# Patient Record
Sex: Female | Born: 1945 | Race: White | Hispanic: No | Marital: Married | Smoking: Former smoker
Health system: Southern US, Community
[De-identification: ages and names within clinical notes are randomized; demographics above are authoritative.]

## PROBLEM LIST (undated history)

## (undated) DIAGNOSIS — I341 Nonrheumatic mitral (valve) prolapse: Secondary | ICD-10-CM

## (undated) DIAGNOSIS — J189 Pneumonia, unspecified organism: Secondary | ICD-10-CM

## (undated) DIAGNOSIS — Z8679 Personal history of other diseases of the circulatory system: Secondary | ICD-10-CM

## (undated) DIAGNOSIS — I739 Peripheral vascular disease, unspecified: Secondary | ICD-10-CM

## (undated) DIAGNOSIS — R112 Nausea with vomiting, unspecified: Secondary | ICD-10-CM

## (undated) DIAGNOSIS — M7040 Prepatellar bursitis, unspecified knee: Secondary | ICD-10-CM

## (undated) DIAGNOSIS — I480 Paroxysmal atrial fibrillation: Secondary | ICD-10-CM

## (undated) DIAGNOSIS — K589 Irritable bowel syndrome without diarrhea: Secondary | ICD-10-CM

## (undated) DIAGNOSIS — M8580 Other specified disorders of bone density and structure, unspecified site: Secondary | ICD-10-CM

## (undated) DIAGNOSIS — I340 Nonrheumatic mitral (valve) insufficiency: Secondary | ICD-10-CM

## (undated) DIAGNOSIS — G4700 Insomnia, unspecified: Secondary | ICD-10-CM

## (undated) DIAGNOSIS — Z9889 Other specified postprocedural states: Secondary | ICD-10-CM

## (undated) DIAGNOSIS — C44601 Unspecified malignant neoplasm of skin of unspecified upper limb, including shoulder: Secondary | ICD-10-CM

## (undated) DIAGNOSIS — R011 Cardiac murmur, unspecified: Secondary | ICD-10-CM

## (undated) DIAGNOSIS — Z87898 Personal history of other specified conditions: Secondary | ICD-10-CM

## (undated) DIAGNOSIS — Z85820 Personal history of malignant melanoma of skin: Secondary | ICD-10-CM

## (undated) HISTORY — DX: Unspecified malignant neoplasm of skin of unspecified upper limb, including shoulder: C44.601

## (undated) HISTORY — DX: Paroxysmal atrial fibrillation: I48.0

## (undated) HISTORY — PX: COLONOSCOPY: SHX174

## (undated) HISTORY — DX: Nonrheumatic mitral (valve) prolapse: I34.1

## (undated) HISTORY — DX: Cardiac murmur, unspecified: R01.1

## (undated) HISTORY — PX: DIAGNOSTIC LAPAROSCOPY: SUR761

## (undated) HISTORY — DX: Other specified disorders of bone density and structure, unspecified site: M85.80

## (undated) HISTORY — DX: Irritable bowel syndrome without diarrhea: K58.9

## (undated) HISTORY — DX: Nonrheumatic mitral (valve) insufficiency: I34.0

## (undated) HISTORY — DX: Insomnia, unspecified: G47.00

---

## 1967-06-23 HISTORY — PX: APPENDECTOMY: SHX54

## 1978-06-22 HISTORY — PX: TONSILLECTOMY: SUR1361

## 1978-06-22 HISTORY — PX: ABDOMINAL HYSTERECTOMY: SHX81

## 2006-06-22 LAB — CONVERTED CEMR LAB: Pap Smear: NORMAL

## 2008-06-22 HISTORY — PX: SHOULDER ARTHROSCOPY: SHX128

## 2008-08-29 ENCOUNTER — Ambulatory Visit: Payer: Self-pay | Admitting: Family Medicine

## 2008-08-29 DIAGNOSIS — J019 Acute sinusitis, unspecified: Secondary | ICD-10-CM

## 2008-09-03 ENCOUNTER — Telehealth: Payer: Self-pay | Admitting: Family Medicine

## 2008-09-04 ENCOUNTER — Telehealth: Payer: Self-pay | Admitting: Family Medicine

## 2008-09-11 ENCOUNTER — Telehealth: Payer: Self-pay | Admitting: Family Medicine

## 2008-09-12 ENCOUNTER — Telehealth: Payer: Self-pay | Admitting: Family Medicine

## 2008-09-17 ENCOUNTER — Telehealth: Payer: Self-pay | Admitting: Family Medicine

## 2008-11-09 ENCOUNTER — Ambulatory Visit (HOSPITAL_COMMUNITY): Admission: RE | Admit: 2008-11-09 | Discharge: 2008-11-09 | Payer: Self-pay | Admitting: Family Medicine

## 2009-01-02 ENCOUNTER — Telehealth: Payer: Self-pay | Admitting: Family Medicine

## 2009-01-24 ENCOUNTER — Telehealth: Payer: Self-pay | Admitting: Family Medicine

## 2009-02-19 ENCOUNTER — Ambulatory Visit: Payer: Self-pay | Admitting: Family Medicine

## 2009-02-19 DIAGNOSIS — F411 Generalized anxiety disorder: Secondary | ICD-10-CM | POA: Insufficient documentation

## 2009-03-22 ENCOUNTER — Telehealth: Payer: Self-pay | Admitting: Family Medicine

## 2009-03-29 ENCOUNTER — Ambulatory Visit: Payer: Self-pay | Admitting: Family Medicine

## 2009-03-29 DIAGNOSIS — G4700 Insomnia, unspecified: Secondary | ICD-10-CM | POA: Insufficient documentation

## 2009-03-29 HISTORY — DX: Insomnia, unspecified: G47.00

## 2009-05-04 ENCOUNTER — Telehealth: Payer: Self-pay | Admitting: Family Medicine

## 2009-05-04 ENCOUNTER — Ambulatory Visit: Payer: Self-pay | Admitting: Family Medicine

## 2009-05-31 ENCOUNTER — Ambulatory Visit: Payer: Self-pay | Admitting: Family Medicine

## 2009-06-26 ENCOUNTER — Ambulatory Visit: Payer: Self-pay | Admitting: Family Medicine

## 2009-06-26 DIAGNOSIS — L02419 Cutaneous abscess of limb, unspecified: Secondary | ICD-10-CM

## 2009-06-26 DIAGNOSIS — L03119 Cellulitis of unspecified part of limb: Secondary | ICD-10-CM

## 2009-07-29 ENCOUNTER — Telehealth: Payer: Self-pay | Admitting: Family Medicine

## 2009-07-30 ENCOUNTER — Telehealth: Payer: Self-pay | Admitting: Family Medicine

## 2009-08-07 ENCOUNTER — Telehealth: Payer: Self-pay | Admitting: *Deleted

## 2009-10-08 ENCOUNTER — Ambulatory Visit: Payer: Self-pay | Admitting: Family Medicine

## 2009-10-31 ENCOUNTER — Telehealth: Payer: Self-pay | Admitting: Family Medicine

## 2009-11-01 ENCOUNTER — Ambulatory Visit: Payer: Self-pay | Admitting: Family Medicine

## 2009-11-01 DIAGNOSIS — J069 Acute upper respiratory infection, unspecified: Secondary | ICD-10-CM | POA: Insufficient documentation

## 2009-11-04 ENCOUNTER — Telehealth: Payer: Self-pay | Admitting: Family Medicine

## 2009-11-11 ENCOUNTER — Ambulatory Visit: Payer: Self-pay | Admitting: Family Medicine

## 2009-11-11 ENCOUNTER — Telehealth: Payer: Self-pay | Admitting: Family Medicine

## 2009-11-11 LAB — CONVERTED CEMR LAB
AST: 22 units/L (ref 0–37)
Albumin: 4.4 g/dL (ref 3.5–5.2)
Alkaline Phosphatase: 55 units/L (ref 39–117)
Basophils Relative: 1.1 % (ref 0.0–3.0)
CO2: 25 meq/L (ref 19–32)
Chloride: 106 meq/L (ref 96–112)
Cholesterol: 215 mg/dL — ABNORMAL HIGH (ref 0–200)
Eosinophils Absolute: 0.1 10*3/uL (ref 0.0–0.7)
Glucose, Urine, Semiquant: NEGATIVE
Lymphocytes Relative: 23.2 % (ref 12.0–46.0)
Lymphs Abs: 1.3 10*3/uL (ref 0.7–4.0)
Monocytes Relative: 8.1 % (ref 3.0–12.0)
Neutrophils Relative %: 65.4 % (ref 43.0–77.0)
Nitrite: NEGATIVE
Potassium: 4.8 meq/L (ref 3.5–5.1)
RBC: 3.92 M/uL (ref 3.87–5.11)
RDW: 13.2 % (ref 11.5–14.6)
Sodium: 142 meq/L (ref 135–145)
Specific Gravity, Urine: 1.025
Total CHOL/HDL Ratio: 3
WBC: 5.8 10*3/uL (ref 4.5–10.5)
pH: 5.5

## 2009-11-12 ENCOUNTER — Ambulatory Visit (HOSPITAL_COMMUNITY): Admission: RE | Admit: 2009-11-12 | Discharge: 2009-11-12 | Payer: Self-pay | Admitting: Family Medicine

## 2009-11-14 ENCOUNTER — Telehealth: Payer: Self-pay | Admitting: Family Medicine

## 2009-11-21 ENCOUNTER — Ambulatory Visit: Payer: Self-pay | Admitting: Family Medicine

## 2010-01-16 ENCOUNTER — Telehealth: Payer: Self-pay | Admitting: Family Medicine

## 2010-01-16 ENCOUNTER — Ambulatory Visit: Payer: Self-pay | Admitting: Family Medicine

## 2010-05-22 ENCOUNTER — Ambulatory Visit: Payer: Self-pay | Admitting: Women's Health

## 2010-05-22 ENCOUNTER — Other Ambulatory Visit
Admission: RE | Admit: 2010-05-22 | Discharge: 2010-05-22 | Payer: Self-pay | Source: Home / Self Care | Admitting: Gynecology

## 2010-07-11 ENCOUNTER — Telehealth: Payer: Self-pay | Admitting: Family Medicine

## 2010-07-16 ENCOUNTER — Ambulatory Visit: Admit: 2010-07-16 | Payer: Self-pay | Admitting: Women's Health

## 2010-07-18 ENCOUNTER — Telehealth: Payer: Self-pay | Admitting: Family Medicine

## 2010-07-22 NOTE — Progress Notes (Signed)
Summary: diflucan??  Phone Note Call from Patient   Caller: Patient Call For: Evelena Peat MD Reason for Call: Acute Illness Summary of Call: [facility]) [phone] Pt has finished antibiotics, and has developed a yeast infection. Would like a RX for Diflucan. Initial call taken by: Lynann Beaver CMA,  Nov 14, 2009 9:43 AM  Follow-up for Phone Call        OK to call in Diflucan 150 mg by mouth times one dose. Follow-up by: Evelena Peat MD,  Nov 14, 2009 4:19 PM    Prescriptions: DIFLUCAN 150 MG TABS (FLUCONAZOLE) one by mouth x 1 dose  #1 x 0   Entered by:   Lynann Beaver CMA   Authorized by:   Evelena Peat MD   Signed by:   Lynann Beaver CMA on 11/14/2009   Method used:   Electronically to        [facility] # [phone]* (retail)       [address]       [place], [place]  [address]       Ph: [phone]       Fax: [phone]   RxID:   [id number]  Pt. notified

## 2010-07-22 NOTE — Progress Notes (Signed)
Summary: F/U ON REQ FOR REFILL Zolpidem  Phone Note Call from Patient   Caller: Patient [phone] Reason for Call: Talk to Nurse, Talk to Doctor Summary of Call: Pt called to ck on status of refill RX for her med (Generic) Ambien - ZOLPIDEM 10MG  TABLETS......Marland KitchenPt adv that same was supposed to be sent to [facility] on [address] but the pharmacy is adv her that they have not received a response from [facility].  Initial call taken by: Debbra Riding,  August 07, 2009 12:17 PM  Follow-up for Phone Call        Rx authorized by Dr Caryl Never and called in, pt informed on phone number left, "this number is no longer in service" Follow-up by: Sid Falcon LPN,  August 07, 2009 1:34 PM     Appended Document: F/U ON REQ FOR REFILL Zolpidem Called pt at # [phone]..... Left msg letting pt know that per Dr Caryl Never / Harriett Sine, LPN... medication was sent to [facility], adv her to c/b if she has any more questions or concerns.

## 2010-07-22 NOTE — Progress Notes (Signed)
Summary: Still having severe sinus, sore throat, eat pain  Phone Note Call from Patient   Caller: Patient Call For: Evelena Peat MD Summary of Call: Pt here for lab work, filled out Triage Patient Request, "still having severe sinus and sore throat, have been on Amoxicillin, still having ear pain. Per written order from Dr Caryl Never, start [facility] 300mg , one tab two times a day for 10 days, #20 Initial call taken by: Sid Falcon LPN,  Nov 11, 2009 1:13 PM  Follow-up for Phone Call        Pt husband informed she is to stop the Amoxicillin, begin [place]vell and follow-up as needed Follow-up by: Sid Falcon LPN,  Nov 11, 2009 3:53 PM    New/Updated Medications: CEFDINIR 300 MG CAPS (CEFDINIR) one tab two times a day Prescriptions: CEFDINIR 300 MG CAPS (CEFDINIR) one tab two times a day  #20 x 0   Entered by:   Sid Falcon LPN   Authorized by:   Evelena Peat MD   Signed by:   Sid Falcon LPN on 04/18/2535   Method used:   Electronically to        [facility] # [phone]* (retail)       [address]       [place], [place]  [address]       Ph: [phone]       Fax: [phone]   RxID:   [id number]

## 2010-07-22 NOTE — Progress Notes (Signed)
Summary: rx for sinus--PLEASE RETURN CALL  Phone Note Call from Patient   Caller: Patient Call For: Evelena Peat MD Reason for Call: Lab or Test Results Summary of Call: [facility] Pt left triage message that she has a sinus infection and would like to be treated by phone.  [facility] [phone] Initial call taken by: Lynann Beaver CMA,  July 29, 2009 9:15 AM  Follow-up for Phone Call        Left message with husband to have her call back. Follow-up by: Lynann Beaver CMA,  July 29, 2009 9:47 AM  Additional Follow-up for Phone Call Additional follow up Details #1::        CALL HER AT [phone]. Teeth, eyes and cheeks hurt. Post nasal drainage. No fever.  Additional Follow-up by: Warnell Forester,  July 29, 2009 9:53 AM    Additional Follow-up for Phone Call Additional follow up Details #2::    Called pt.  Will refill Augmentin and office f/u if no better in 2 weeks. Follow-up by: Evelena Peat MD,  July 29, 2009 10:38 AM  New/Updated Medications: AMOXICILLIN-POT CLAVULANATE 875-125 MG TABS (AMOXICILLIN-POT CLAVULANATE) one by mouth two times a day for 10 days Prescriptions: AMOXICILLIN-POT CLAVULANATE 875-125 MG TABS (AMOXICILLIN-POT CLAVULANATE) one by mouth two times a day for 10 days  #20 x 0   Entered and Authorized by:   Evelena Peat MD   Signed by:   Evelena Peat MD on 07/29/2009   Method used:   Electronically to        [facility] # [phone]* (retail)       [address]       [place], [place]  [address]       Ph: [phone]       Fax: [phone]   RxID:   [id number]

## 2010-07-22 NOTE — Assessment & Plan Note (Signed)
Summary: ear pain//ccm   Vital Signs:  Patient profile:   65 year old female Menstrual status:  hysterectomy Temp:     98.7 degrees F oral BP sitting:   120 / 80  (left arm) Cuff size:   regular  Vitals Entered By: Sid Falcon LPN (October 08, 2009 11:45 AM) CC: R > L ear pain     Menstrual Status hysterectomy Last PAP Result normal   History of Present Illness: Patient seen with bilateral earache right greater than left. Several day history of progressive facial pain. No fever. Occasional yellowish nasal discharge. Occasional vertigo symptoms. No hearing loss. Rare cough.  History of seasonal allergies and uses Astelin regularly.  Patient desires complete physical and will schedule.  Allergies: 1)  ! Prednisone (Pak) (Prednisone)  Past History:  Past Medical History: Last updated: 08/29/2008 Migraines Heart Murmur UTI  Review of Systems      See HPI  Physical Exam  General:  Well-developed,well-nourished,in no acute distress; alert,appropriate and cooperative throughout examination Head:  Normocephalic and atraumatic without obvious abnormalities. No apparent alopecia or balding. Ears:  External ear exam shows no significant lesions or deformities.  Otoscopic examination reveals clear canals, tympanic membranes are intact bilaterally without bulging, retraction, inflammation or discharge. Hearing is grossly normal bilaterally. Nose:  mostly clear nasal mucus bilaterally Mouth:  Oral mucosa and oropharynx without lesions or exudates.  Teeth in good repair. Neck:  No deformities, masses, or tenderness noted. Lungs:  Normal respiratory effort, chest expands symmetrically. Lungs are clear to auscultation, no crackles or wheezes. Heart:  Normal rate and regular rhythm. S1 and S2 normal without gallop, murmur, click, rub or other extra sounds.   Impression & Recommendations:  Problem # 1:  SINUSITIS - ACUTE-NOS (ICD-461.9)  The following medications were removed  from the medication list:    Amoxicillin-pot Clavulanate 875-125 Mg Tabs (Amoxicillin-pot clavulanate) ..... One by mouth two times a day for 10 days Her updated medication list for this problem includes:    Astelin 137 Mcg/spray Soln (Azelastine hcl) .Marland Kitchen... 2 sprays per nostril two times a day as needed    Amoxicillin 875 Mg Tabs (Amoxicillin) ..... One by mouth two times a day for 10 days  Complete Medication List: 1)  Astelin 137 Mcg/spray Soln (Azelastine hcl) .... 2 sprays per nostril two times a day as needed 2)  Alprazolam 0.5 Mg Tabs (Alprazolam) .... One as needed for flying 3)  Fluoxetine Hcl 20 Mg Caps (Fluoxetine hcl) .... Once daily 4)  Zolpidem Tartrate 10 Mg Tabs (Zolpidem tartrate) .... One tab at bedtime as needed 5)  Diflucan 150 Mg Tabs (Fluconazole) .... One by mouth x 1 dose 6)  Amoxicillin 875 Mg Tabs (Amoxicillin) .... One by mouth two times a day for 10 days  Patient Instructions: 1)  Acute sinusitis symptoms for less than 10 days are not helped by antibiotics. Use warm moist compresses, and over the counter decongestants( only as directed). Call if no improvement in 5-7 days, sooner if increasing pain, fever, or new symptoms.  2)  Schedule complete physical examination Prescriptions: AMOXICILLIN 875 MG TABS (AMOXICILLIN) one by mouth two times a day for 10 days  #20 x 0   Entered and Authorized by:   Evelena Peat MD   Signed by:   Evelena Peat MD on 10/08/2009   Method used:   Print then Give to Patient   RxID:   [id number]

## 2010-07-22 NOTE — Progress Notes (Signed)
Summary: antibiotic  Phone Note Call from Patient   Caller: Patient Call For: Evelena Peat MD Summary of Call: Pt is calling to see if Dr. Caryl Never will order an antibiotic for her as she is a little better, but her throat is still extremely sore. [facility]) [phone] Initial call taken by: Lynann Beaver CMA,  Nov 04, 2009 11:12 AM  Follow-up for Phone Call        Amoxicillin 875 mg by mouth two times a day for 10 days. Follow-up by: Evelena Peat MD,  Nov 04, 2009 1:08 PM    New/Updated Medications: AMOXICILLIN 875 MG TABS (AMOXICILLIN) one by mouth two times a day x 10 days Prescriptions: AMOXICILLIN 875 MG TABS (AMOXICILLIN) one by mouth two times a day x 10 days  #20 x 0   Entered by:   Lynann Beaver CMA   Authorized by:   Evelena Peat MD   Signed by:   Lynann Beaver CMA on 11/04/2009   Method used:   Electronically to        [facility] # [phone]* (retail)       [address]       [place], [place]  [address]       Ph: [phone]       Fax: [phone]   RxID:   [id number]  Pt. notified.

## 2010-07-22 NOTE — Assessment & Plan Note (Signed)
Summary: ?LEG INF/OK PER DOC/NJR   Vital Signs:  Patient profile:   65 year old female Temp:     97.7 degrees F oral BP sitting:   110 / 70  (left arm) Cuff size:   regular  Vitals Entered By: Sid Falcon LPN (June 26, 2009 1:51 PM) CC: left lower leg injury 5 days ago, red, painful   History of Present Illness: Acute visit. Leg injury 5 days ago. Puppy scratched her leg with flap laceration. Started with some redness 2 days ago. No fevers or chills. Minimal drainage. Last tetanus 2009. Minimal pain.  Allergies: 1)  ! Prednisone (Pak) (Prednisone)  Past History:  Past Medical History: Last updated: 08/29/2008 Migraines Heart Murmur UTI  Review of Systems      See HPI  Physical Exam  General:  Well-developed,well-nourished,in no acute distress; alert,appropriate and cooperative throughout examination Neck:  No deformities, masses, or tenderness noted. Lungs:  Normal respiratory effort, chest expands symmetrically. Lungs are clear to auscultation, no crackles or wheezes. Heart:  Normal rate and regular rhythm. S1 and S2 normal without gallop, murmur, click, rub or other extra sounds. Extremities:  left lower leg laterally with refills flap-like laceration approximately 3 cm in length. Somewhat dusky appearance to the skin. Surrounding area 2 cm around periphery of wound with erythema and slight tenderness and minimal swelling. No purulent drainage.   Impression & Recommendations:  Problem # 1:  CELLULITIS, LEG, LEFT (ICD-682.6)  The following medications were removed from the medication list:    Amoxicillin 875 Mg Tabs (Amoxicillin) .Marland Kitchen... 1 by mouth two times a day    Amoxicillin-pot Clavulanate 875-125 Mg Tabs (Amoxicillin-pot clavulanate) ..... One by mouth two times a day for 10 days Her updated medication list for this problem includes:    Cephalexin 500 Mg Caps (Cephalexin) ..... One by mouth three times a day for 10 days  Complete Medication List: 1)   Astelin 137 Mcg/spray Soln (Azelastine hcl) .... 2 sprays per nostril two times a day as needed 2)  Alprazolam 0.5 Mg Tabs (Alprazolam) .... One as needed for flying 3)  Fluoxetine Hcl 20 Mg Caps (Fluoxetine hcl) .... Once daily 4)  Cephalexin 500 Mg Caps (Cephalexin) .... One by mouth three times a day for 10 days  Patient Instructions: 1)  Elevate legs frequently. 2)  Try topical heat such as heating pad 3 times daily. 3)  Follow promptly if you develop any fever or chills Prescriptions: CEPHALEXIN 500 MG CAPS (CEPHALEXIN) one by mouth three times a day for 10 days  #30 x 0   Entered and Authorized by:   Evelena Peat MD   Signed by:   Evelena Peat MD on 06/26/2009   Method used:   Electronically to        [facility] # [phone]* (retail)       [address]       [place], [place]  [address]       Ph: [phone]       Fax: [phone]   RxID:   [id number]   Preventive Care Screening  Last Tetanus Booster:    Date:  02/21/2008    Results:  Historical

## 2010-07-22 NOTE — Assessment & Plan Note (Signed)
Summary: shingle vaccine/njr  Nurse Visit   Allergies: 1)  ! Prednisone (Pak) (Prednisone)  Immunizations Administered:  Zostavax # 1:    Vaccine Type: Zostavax    Site: left deltoid    Mfr: Merck    Dose: 0.5 ml    Route: IM    Given by: Sid Falcon LPN    Exp. Date: 01/21/2011    Lot #: [id number]  Orders Added: 1)  Zoster (Shingles) Vaccine Live [90736] 2)  Admin 1st Vaccine [phone]

## 2010-07-22 NOTE — Progress Notes (Signed)
Summary: VM, Offered Zostavax vaccine  Phone Note Outgoing Call Call back at [facility] Phone [phone]   Call placed by: Sid Falcon LPN,  January 16, 2010 11:12 AM Call placed to: Patient Summary of Call: VM left for pt offering Shingles, Zostavax vaccine.  Instructed her to call her insurance, vaccine $280.00 and will need to sign a form allowing [place] to bill her if they do not pay. Initial call taken by: Sid Falcon LPN,  January 16, 2010 11:16 AM  Follow-up for Phone Call        Pt accepted, she did check with her insurance. Follow-up by: Sid Falcon LPN,  January 16, 2010 3:57 PM

## 2010-07-22 NOTE — Assessment & Plan Note (Signed)
Summary: EAR PAIN, CONGESTION // RS   Vital Signs:  Patient profile:   65 year old female Menstrual status:  hysterectomy Temp:     98.2 degrees F oral BP sitting:   120 / 80  (left arm) Cuff size:   regular  Vitals Entered By: Sid Falcon LPN (Nov 01, 2009 12:06 PM) CC: congestion, right ear pain X 5 days   History of Present Illness: Acute visit. Right ear pain past 5 days with some nasal congestion. Denies sore throat. Dry cough especially at night. No relief with Delsym. Patient has history of frequent sinusitis. Denies any recent headache. Does have some greenish nasal discharge.  Allergies: 1)  ! Prednisone (Pak) (Prednisone)  Past History:  Past Medical History: Last updated: 08/29/2008 Migraines Heart Murmur UTI  Review of Systems      See HPI  Physical Exam  General:  Well-developed,well-nourished,in no acute distress; alert,appropriate and cooperative throughout examination Ears:  External ear exam shows no significant lesions or deformities.  Otoscopic examination reveals clear canals, tympanic membranes are intact bilaterally without bulging, retraction, inflammation or discharge. Hearing is grossly normal bilaterally. Nose:  External nasal examination shows no deformity or inflammation. Nasal mucosa are pink and moist without lesions or exudates. Mouth:  previous tonsillectomy. Clear Neck:  No deformities, masses, or tenderness noted. Lungs:  Normal respiratory effort, chest expands symmetrically. Lungs are clear to auscultation, no crackles or wheezes. Heart:  Normal rate and regular rhythm. S1 and S2 normal without gallop, murmur, click, rub or other extra sounds.   Impression & Recommendations:  Problem # 1:  VIRAL URI (ICD-465.9) cough med and no indication for antibiotic at this time. Her updated medication list for this problem includes:    Delsym 30 Mg/39ml Lqcr (Dextromethorphan polistirex) .Marland Kitchen... As directed    Tussionex Pennkinetic Er 8-10  Mg/4ml Lqcr (Chlorpheniramine-hydrocodone) ..... One tsp by mouth q 12 hours as needed cough  Complete Medication List: 1)  Astelin 137 Mcg/spray Soln (Azelastine hcl) .... 2 sprays per nostril two times a day as needed 2)  Alprazolam 0.5 Mg Tabs (Alprazolam) .... One as needed for flying 3)  Fluoxetine Hcl 20 Mg Caps (Fluoxetine hcl) .... Once daily 4)  Zolpidem Tartrate 10 Mg Tabs (Zolpidem tartrate) .... One tab at bedtime as needed 5)  Diflucan 150 Mg Tabs (Fluconazole) .... One by mouth x 1 dose 6)  Amoxicillin 875 Mg Tabs (Amoxicillin) .... One by mouth two times a day for 10 days 7)  Delsym 30 Mg/33ml Lqcr (Dextromethorphan polistirex) .... As directed 8)  Tussionex Pennkinetic Er 8-10 Mg/46ml Lqcr (Chlorpheniramine-hydrocodone) .... One tsp by mouth q 12 hours as needed cough  Patient Instructions: 1)  Get plenty of rest, drink lots of clear liquids, and use Tylenol or Ibuprofen for fever and comfort. Return in 7-10 days if you're not better: sooner if you'er feeling worse.  Prescriptions: TUSSIONEX PENNKINETIC ER 8-10 MG/5ML LQCR (CHLORPHENIRAMINE-HYDROCODONE) one tsp by mouth q 12 hours as needed cough  #90 ml x 0   Entered and Authorized by:   Evelena Peat MD   Signed by:   Evelena Peat MD on 11/01/2009   Method used:   Print then Give to Patient   RxID:   [id number]

## 2010-07-22 NOTE — Progress Notes (Signed)
Summary: diflucan  Phone Note Call from Patient   Caller: Patient Call For: Evelena Peat MD Summary of Call: Pt is asking for Diflucan to be called in to [facility] [place].  She knows she will get a yeast infection from the antibiotics she is taking. Initial call taken by: Lynann Beaver CMA,  July 30, 2009 9:36 AM  Follow-up for Phone Call        OK to refill Diflucan 150 mg by mouth times one dose. Follow-up by: Evelena Peat MD,  July 30, 2009 12:35 PM    New/Updated Medications: DIFLUCAN 150 MG TABS (FLUCONAZOLE) one by mouth x 1 dose Prescriptions: DIFLUCAN 150 MG TABS (FLUCONAZOLE) one by mouth x 1 dose  #1 x 0   Entered by:   Lynann Beaver CMA   Authorized by:   Evelena Peat MD   Signed by:   Lynann Beaver CMA on 07/30/2009   Method used:   Electronically to        [facility] # [phone]* (retail)       [address]       [place], [place]  [address]       Ph: [phone]       Fax: [phone]   RxID:   [id number]

## 2010-07-22 NOTE — Assessment & Plan Note (Signed)
Summary: CPX//ALP   Vital Signs:  Patient profile:   65 year old female Menstrual status:  hysterectomy Height:      66.75 inches Weight:      141 pounds BMI:     22.33 Temp:     97.8 degrees F oral Pulse rate:   72 / minute Pulse rhythm:   regular Resp:     12 per minute BP sitting:   140 / 80  (left arm) Cuff size:   regular  Vitals Entered By: Sid Falcon LPN (November 22, 6043 11:38 AM) CC: CPX, labs done   History of Present Illness: Patient here for complete physical examination.  History of previous hysterectomy for benign disease. Had Pap smear per gynecologist last year. Mammogram last month normal. Colonoscopy 2009 and normal. Immunizations up to date with the exception of no prior Zostavax.  exercises regularly.  Patient has history of some chronic insomnia and takes generic Ambien for that. History of perennial allergies and uses Astelin nasal spray.  Family history and social history reviewed and no significant changes.      Preventive Screening-Counseling & Management  Alcohol-Tobacco     Smoking Status: quit  Allergies: 1)  ! Prednisone (Pak) (Prednisone)  Past History:  Family History: Last updated: 11/21/2009 Family History of Alcoholism/Addiction Family History Diabetes 1st degree relative  Mother type2 Grandmother breast cancer. Aunt ?ovarian cancer.  Social History: Last updated: 11/21/2009 Retired Married Alcohol use-yes Regular exercise-yes Former Smoker  Risk Factors: Exercise: yes (08/29/2008)  Risk Factors: Smoking Status: quit (11/21/2009)  Past medical, surgical, family and social histories (including risk factors) reviewed for relevance to current acute and chronic problems.  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  Past Surgical History: Reviewed history from 08/29/2008 and no changes required. Appendectomy 1969 Hysterectomy  1980 Tonsillectomy  1980  Family  History: Reviewed history from 08/29/2008 and no changes required. Family History of Alcoholism/Addiction Family History Diabetes 1st degree relative  Mother type2 Grandmother breast cancer. Aunt ?ovarian cancer.  Social History: Reviewed history from 08/29/2008 and no changes required. Retired Married Alcohol use-yes Regular exercise-yes Former Smoker Smoking Status:  quit  Review of Systems  The patient denies anorexia, fever, weight loss, weight gain, vision loss, decreased hearing, hoarseness, chest pain, syncope, dyspnea on exertion, peripheral edema, prolonged cough, headaches, hemoptysis, abdominal pain, melena, hematochezia, severe indigestion/heartburn, hematuria, incontinence, muscle weakness, suspicious skin lesions, depression, enlarged lymph nodes, and breast masses.    Physical Exam  General:  Well-developed,well-nourished,in no acute distress; alert,appropriate and cooperative throughout examination Head:  Normocephalic and atraumatic without obvious abnormalities. No apparent alopecia or balding. Eyes:  No corneal or conjunctival inflammation noted. EOMI. Perrla. Funduscopic exam benign, without hemorrhages, exudates or papilledema. Vision grossly normal. Ears:  External ear exam shows no significant lesions or deformities.  Otoscopic examination reveals clear canals, tympanic membranes are intact bilaterally without bulging, retraction, inflammation or discharge. Hearing is grossly normal bilaterally. Mouth:  Oral mucosa and oropharynx without lesions or exudates.  Teeth in good repair. Neck:  No deformities, masses, or tenderness noted. Lungs:  Normal respiratory effort, chest expands symmetrically. Lungs are clear to auscultation, no crackles or wheezes. Heart:  normal rate, regular rhythm, and no gallop.  2/6 systolic murmur left sternal border Abdomen:  soft, non-tender, normal bowel sounds, no distention, no masses, no hepatomegaly, and no splenomegaly.     Genitalia:  gyn Msk:  No deformity or scoliosis noted of thoracic or lumbar  spine.   Extremities:  No clubbing, cyanosis, edema, or deformity noted with normal full range of motion of all joints.   Neurologic:  alert & oriented X3, cranial nerves II-XII intact, strength normal in all extremities, and gait normal.   Skin:  the skin is darkly tanned. No concerning lesions noted. Cervical Nodes:  No lymphadenopathy noted Psych:  normally interactive, good eye contact, not anxious appearing, and not depressed appearing.     Impression & Recommendations:  Problem # 1:  Preventive Health Care (ICD-V70.0) discussed protection from excessive sun exposure. Labs reviewed with patient and all favorable. Continue yearly mammogram. No need for Pap smear with history of hysterectomy for benign disease. Needs Zostavax and put on waiting list.  Complete Medication List: 1)  Astelin 137 Mcg/spray Soln (Azelastine hcl) .... 2 sprays per nostril two times a day as needed 2)  Alprazolam 0.5 Mg Tabs (Alprazolam) .... One as needed for flying 3)  Fluoxetine Hcl 20 Mg Caps (Fluoxetine hcl) .... Once daily 4)  Zolpidem Tartrate 10 Mg Tabs (Zolpidem tartrate) .... One tab at bedtime as needed 5)  Diflucan 150 Mg Tabs (Fluconazole) .... One by mouth x 1 dose 6)  Amoxicillin 875 Mg Tabs (Amoxicillin) .... One by mouth two times a day for 10 days 7)  Delsym 30 Mg/44ml Lqcr (Dextromethorphan polistirex) .... As directed 8)  Tussionex Pennkinetic Er 8-10 Mg/33ml Lqcr (Chlorpheniramine-hydrocodone) .... One tsp by mouth q 12 hours as needed cough 9)  Amoxicillin 875 Mg Tabs (Amoxicillin) .... One by mouth two times a day x 10 days 10)  Cefdinir 300 Mg Caps (Cefdinir) .... One tab two times a day  Patient Instructions: 1)  It is important that you exercise reguarly at least 20 minutes 5 times a week. If you develop chest pain, have severe difficulty breathing, or feel very tired, stop exercising immediately and seek  medical attention.  2)  Take calcium +vitamin D daily.  Prescriptions: ASTELIN 137 MCG/SPRAY SOLN (AZELASTINE HCL) 2 sprays per nostril two times a day as needed  #1 x 11   Entered and Authorized by:   Evelena Peat MD   Signed by:   Evelena Peat MD on 11/21/2009   Method used:   Electronically to        [facility] # [phone]* (retail)       [address]       [place], [place]  [address]       Ph: [phone]       Fax: [phone]   RxID:   [id number] ZOLPIDEM TARTRATE 10 MG TABS (ZOLPIDEM TARTRATE) one tab at bedtime as needed  #30 x 5   Entered and Authorized by:   Evelena Peat MD   Signed by:   Evelena Peat MD on 11/21/2009   Method used:   Print then Give to Patient   RxID:   [id number] ALPRAZOLAM 0.5 MG TABS (ALPRAZOLAM) one as needed for flying  #30 x 0   Entered and Authorized by:   Evelena Peat MD   Signed by:   Evelena Peat MD on 11/21/2009   Method used:   Print then Give to Patient   RxID:   [id number]    Preventive Care Screening  Mammogram:    Date:  11/12/2009    Results:  normal

## 2010-07-22 NOTE — Progress Notes (Signed)
Summary: sinus symptoms  Phone Note Call from Patient   Caller: Patient Call For: April Peat MD Summary of Call: Pt is on the way home from [place], and has bilateral ear pain, ? fever, green sinus drainage, non productive cough that keeps her up all night. Would like Rx for cough med sent to [facility] [place]).  Will not be back in town until [place]. [phone] Initial call taken by: Lynann Beaver CMA,  Oct 31, 2009 9:36 AM  Follow-up for Phone Call        I would recommend she try Delsym OTC and will be happy to work her in to be seen if needed when she gets back. Follow-up by: April Peat MD,  Oct 31, 2009 9:37 AM    New/Updated Medications: DELSYM 30 MG/5ML LQCR (DEXTROMETHORPHAN POLISTIREX) as directed Prescriptions: DELSYM 30 MG/5ML LQCR (DEXTROMETHORPHAN POLISTIREX) as directed  #1 bottle x 0   Entered by:   Lynann Beaver CMA   Authorized by:   April Peat MD   Signed by:   Lynann Beaver CMA on 10/31/2009   Method used:   Electronically to        [facility] # [phone]* (retail)       [address]       [place], [place]  [address]       Ph: [phone]       Fax: [phone]   RxID:   [id number]

## 2010-07-24 NOTE — Progress Notes (Signed)
Summary: Question about Alprazolam and other meds  Phone Note Call from Patient   Caller: Patient Call For: Evelena Peat MD Summary of Call: Flextor Patch                 Pt tore tendon in her arm. Vivimo Ambien will be using Alprazalam to fly.  Any precautions she should take with meds? Initial call taken by: Lynann Beaver CMA AAMA,  July 18, 2010 9:43 AM  Follow-up for Phone Call        Is she calling for refills of these med? Follow-up by: Evelena Peat MD,  July 18, 2010 10:11 AM  Additional Follow-up for Phone Call Additional follow up Details #1::        No, she is just concerned if she can use them together.   She knows not to use the Ambien and Alprazalam together. Additional Follow-up by: Lynann Beaver CMA AAMA,  July 18, 2010 11:20 AM    Additional Follow-up for Phone Call Additional follow up Details #2::    agree with advce not to mix Ambien and Alprazolam.  OK to use patch with these. Follow-up by: Evelena Peat MD,  July 18, 2010 1:04 PM  Additional Follow-up for Phone Call Additional follow up Details #3:: Details for Additional Follow-up Action Taken: Pt husband informed Additional Follow-up by: Sid Falcon LPN,  July 18, 2010 3:02 PM

## 2010-07-24 NOTE — Progress Notes (Addendum)
Summary: Alprazolam for flying  Phone Note Call from Patient   Caller: Patient Call For: Evelena Peat MD Summary of Call: Pt left VM, she is flying out of town this weekend, requestng Alprazolam Initial call taken by: Sid Falcon LPN,  July 11, 2010 4:37 PM  Follow-up for Phone Call        Refill OK Follow-up by: Evelena Peat MD,  July 11, 2010 5:18 PM  Additional Follow-up for Phone Call Additional follow up Details #1::        Rx called in, pt informed Additional Follow-up by: Sid Falcon LPN,  July 11, 2010 5:55 PM    Prescriptions: ALPRAZOLAM 0.5 MG TABS (ALPRAZOLAM) one as needed for flying  #30 x 0   Entered by:   Sid Falcon LPN   Authorized by:   Evelena Peat MD   Signed by:   Sid Falcon LPN on 16/03/9603   Method used:   Telephoned to ...       [facility] [address] [phone]* (retail)       [address]       [place], [place]  [address]       Ph: [phone]       Fax: [phone]   RxID:   [id number]   Appended Document: Alprazolam for flying RX called in to pharmacist as pt reports they do not have Rx that was called in on 1/20.

## 2010-08-11 ENCOUNTER — Encounter: Payer: Self-pay | Admitting: Family Medicine

## 2010-08-11 ENCOUNTER — Ambulatory Visit (INDEPENDENT_AMBULATORY_CARE_PROVIDER_SITE_OTHER): Payer: BC Managed Care – PPO | Admitting: Family Medicine

## 2010-08-11 VITALS — BP 120/78 | Temp 97.8°F | Ht 67.75 in | Wt 131.0 lb

## 2010-08-11 DIAGNOSIS — B349 Viral infection, unspecified: Secondary | ICD-10-CM

## 2010-08-11 DIAGNOSIS — B9789 Other viral agents as the cause of diseases classified elsewhere: Secondary | ICD-10-CM

## 2010-08-11 MED ORDER — HYDROCODONE-HOMATROPINE 5-1.5 MG/5ML PO SYRP: 5.0000 mL | ORAL_SOLUTION | ORAL | Status: AC | PRN

## 2010-08-11 NOTE — Progress Notes (Signed)
  Subjective:    Patient ID: [name], female    DOB: [birth year], 65 y.o.   MRN: [id number]  HPI  Patient seen for acute visit. Onset over the weekend of bilateral earache, sore throat , malaise, body aches, nasal congestion, and intermittent headache. No vomiting or diarrhea. Mostly dry cough , especially at night. Patient is an ex-smoker.   Symptoms improved somewhat on Tylenol   Review of Systems   As per history of present illness    Objective:   Physical Exam  patient is alert and in no distress Oropharynx previous tonsillectomy. No erythema or exudate Eardrums are normal Neck supple no adenopathy Chest clear to auscultation Heart regular rate with no murmur       Assessment & Plan:   viral syndrome. Reassurance given. Hydromet as needed for cough

## 2010-08-11 NOTE — Patient Instructions (Signed)
Viral Syndrome     You or your child have Viral Syndrome. It is the most common infection causing "colds" and infections in the nose, throat, sinuses, and breathing tubes. Sometimes the infection causes nausea, vomiting, or diarrhea. The germ that causes the infection is a virus. No antibiotic or other medicine will kill it. There are medicines that you can take to make you or your child more comfortable.         HOME CARE INSTRUCTIONS   Rest in bed until you start to feel better.   If you have diarrhea or vomiting, eat small amounts of crackers and toast. Soup is helpful.    For children, DO NOT give aspirin or medicine that contains aspirin.   Only take over-the-counter or prescription medicines for pain, discomfort, or fever as directed by your caregiver.     SEEK MEDICAL CARE IF:   You or your child have not improved within one week.   You or your child have pain that is not at least partially relieved by over-the-counter medicine.   Thick, colored mucus or blood is coughed up.   Discharge from the nose becomes thick yellow or green.   Diarrhea or vomiting gets worse.   There is any major change in your or your child's condition.   You or your child develops a skin rash, stiff neck, severe headache, or are unable to hold down food or fluid.   You or your child has an oral temperature above 102 F (38.9 C), not controlled by medicine.   Your baby is older than 3 months with a rectal temperature of 102 F (38.9 C) or higher.   Your baby is 3 months old or younger with a rectal temperature of 100.4 F (38 C) or higher.     Document Released: 05/24/2006  Document Re-Released: 04/05/2009  Exit[employer] Patient Information 2011 [employer].

## 2010-08-13 ENCOUNTER — Encounter: Payer: Self-pay | Admitting: Family Medicine

## 2010-08-13 ENCOUNTER — Telehealth: Payer: Self-pay | Admitting: *Deleted

## 2010-08-13 ENCOUNTER — Ambulatory Visit (INDEPENDENT_AMBULATORY_CARE_PROVIDER_SITE_OTHER): Payer: BC Managed Care – PPO | Admitting: Family Medicine

## 2010-08-13 VITALS — BP 100/62 | Temp 98.0°F

## 2010-08-13 DIAGNOSIS — B373 Candidiasis of vulva and vagina: Secondary | ICD-10-CM

## 2010-08-13 DIAGNOSIS — H6690 Otitis media, unspecified, unspecified ear: Secondary | ICD-10-CM

## 2010-08-13 MED ORDER — FLUCONAZOLE 150 MG PO TABS: 150.0000 mg | ORAL_TABLET | Freq: Once | ORAL | Status: AC

## 2010-08-13 MED ORDER — AMOXICILLIN-POT CLAVULANATE 875-125 MG PO TABS: 1.0000 | ORAL_TABLET | Freq: Two times a day (BID) | ORAL | Status: AC

## 2010-08-13 NOTE — Telephone Encounter (Signed)
Pt. Notified to buy Acitvia OTC>

## 2010-08-13 NOTE — Telephone Encounter (Signed)
Pt's husband wants a prescription for yogurt????  (to take with antibiotics)

## 2010-08-13 NOTE — Progress Notes (Signed)
  Subjective:    Patient ID: [name], female    DOB: [birth year], 65 y.o.   MRN: [id number]  HPI  Patient is seen 2 days ago with typical viral URI type symptoms. Now presents with progressive right ear pain. Sharp to dull pain moderate severity. Ongoing cough which is mostly dry. No fever. No significant vertigo. No hearing changes. Also has some progressive thick yellow to bloody nasal discharge. Intermittent mild headaches. History of frequent sinusitis in the past   Review of Systems  as per history of present illness    Objective:   Physical Exam  patient is alert and in no distress Nasal exam is unremarkable Left her tone is normal  right tympanic membrane is erythematous with distorted landmarks  neck is supple no adenopathy Chest clear to auscultation Heart regular rhythm and rate       Assessment & Plan:   Acute right suppurative otitis media following viral URI. Start Augmentin 875 mg twice a day for 10 days

## 2010-08-13 NOTE — Telephone Encounter (Signed)
OK to call in Diflucan 150 mg po times one dose.

## 2010-08-13 NOTE — Telephone Encounter (Signed)
They don't need prescription.  Take Activia.

## 2010-08-13 NOTE — Telephone Encounter (Signed)
Wants wife to have Diflucan instead of yogurt.  Has a problem digesting yogurt.

## 2010-08-27 ENCOUNTER — Telehealth: Payer: Self-pay | Admitting: Family Medicine

## 2010-08-27 DIAGNOSIS — B373 Candidiasis of vulva and vagina: Secondary | ICD-10-CM

## 2010-08-27 MED ORDER — FLUCONAZOLE 150 MG PO TABS: 150.0000 mg | ORAL_TABLET | Freq: Once | ORAL | Status: AC

## 2010-08-27 NOTE — Telephone Encounter (Signed)
Let's try Diflucan 150 mg one dose and consider OTC topical such as Lamisil and office f/u if no better in 2 weeks.

## 2010-08-27 NOTE — Telephone Encounter (Signed)
Triage vm------complains of an outside vaginal itch. No discharge. But has been on abx for a long time. She is uncomfortable and would like relief called to [facility] on [address]. Please return her call.

## 2010-08-27 NOTE — Telephone Encounter (Signed)
Rx sent, pt informed. 

## 2010-08-27 NOTE — Telephone Encounter (Signed)
Triage vm----complains of an outside vaginal itch. No discharge. Was on abx for a thousand of years. Wants relief called to

## 2010-08-27 NOTE — Telephone Encounter (Signed)
Please advise,

## 2010-08-29 ENCOUNTER — Ambulatory Visit (INDEPENDENT_AMBULATORY_CARE_PROVIDER_SITE_OTHER): Payer: BC Managed Care – PPO | Admitting: Women's Health

## 2010-08-29 DIAGNOSIS — B373 Candidiasis of vulva and vagina: Secondary | ICD-10-CM

## 2010-08-29 DIAGNOSIS — N898 Other specified noninflammatory disorders of vagina: Secondary | ICD-10-CM

## 2010-09-30 ENCOUNTER — Other Ambulatory Visit: Payer: Self-pay | Admitting: Family Medicine

## 2010-09-30 NOTE — Telephone Encounter (Signed)
Last filled 11-21-09 with #30 and 5 refills

## 2010-10-01 NOTE — Telephone Encounter (Signed)
Rx called in 

## 2010-10-01 NOTE — Telephone Encounter (Signed)
May refill with 5 refills

## 2010-10-13 ENCOUNTER — Other Ambulatory Visit: Payer: Self-pay | Admitting: Family Medicine

## 2010-10-13 DIAGNOSIS — Z1231 Encounter for screening mammogram for malignant neoplasm of breast: Secondary | ICD-10-CM

## 2010-10-21 ENCOUNTER — Ambulatory Visit (INDEPENDENT_AMBULATORY_CARE_PROVIDER_SITE_OTHER): Payer: BC Managed Care – PPO | Admitting: Family Medicine

## 2010-10-21 ENCOUNTER — Encounter: Payer: Self-pay | Admitting: Family Medicine

## 2010-10-21 VITALS — BP 140/80 | HR 70 | Temp 97.7°F | Resp 12

## 2010-10-21 DIAGNOSIS — R0602 Shortness of breath: Secondary | ICD-10-CM

## 2010-10-21 DIAGNOSIS — Z136 Encounter for screening for cardiovascular disorders: Secondary | ICD-10-CM

## 2010-10-21 NOTE — Progress Notes (Signed)
  Subjective:    Patient ID: [name], female    DOB: 03/17/1946, 65 y.o.   MRN: [id number]  HPI Patient seen with episode of dyspnea yesterday while walking. Generally she exercises and walks without difficulty. Recent history is right rotator cuff surgery repair couple weeks ago. That went uneventfully. No upper extremity edema. She's been walking for the past couple of weeks and yesterday about 20 minutes into her walk noticed some increased dyspnea. She felt generally weak and had sensation of anterior chest pressure. This lasted several hours. She had some mild diaphoresis. No nausea or vomiting. Denied any cough or wheezing. No history of similar symptoms. She has some vague discomfort today but overall better than yesterday.  No prior episodes of exertion related discomfort.  Ex-smoker- quit several years ago. Mild hyperlipidemia but excellent HDL. No history of diabetes or hypertension. No family history of premature coronary disease. She denies any pleuritic pain, cough, hemoptysis  Past Medical History  Diagnosis Date  . ANXIETY 02/19/2009  . INSOMNIA, CHRONIC 03/29/2009   Past Surgical History  Procedure Date  . Appendectomy 1969  . Abdominal hysterectomy 1980  . Tonsillectomy 1980    reports that she quit smoking about 40 years ago. Her smoking use included Cigarettes. She has a 20 pack-year smoking history. She does not have any smokeless tobacco history on file. Her alcohol and drug histories not on file. family history includes Alcohol abuse in her father; Cancer in her maternal aunt and maternal grandfather; and Diabetes in her mother. No Known Allergies    Review of Systems  Constitutional: Negative for fever, chills, appetite change and unexpected weight change.  HENT: Negative for neck pain.   Respiratory: Positive for chest tightness and shortness of breath. Negative for cough and wheezing.   Cardiovascular: Negative for palpitations and leg swelling.    Gastrointestinal: Negative for abdominal pain.  Neurological: Negative for dizziness, syncope and headaches.  Hematological: Negative for adenopathy.       Objective:   Physical Exam  Constitutional: She is oriented to person, place, and time. She appears well-developed and well-nourished.  HENT:  Right Ear: External ear normal.  Left Ear: External ear normal.  Neck: No thyromegaly present.  Cardiovascular: Normal rate, regular rhythm and normal heart sounds.   No murmur heard. Pulmonary/Chest: Effort normal and breath sounds normal. No respiratory distress. She has no wheezes. She has no rales.  Musculoskeletal: She exhibits no edema.       Right upper extremity in sling. No edema noted  Lymphadenopathy:    She has no cervical adenopathy.  Neurological: She is alert and oriented to person, place, and time.          Assessment & Plan:  Patient presents with exertional dyspnea and chest discomfort. Relatively low risk for coronary disease but she does not have any other good explanation for acute dyspnea with activity. EKG today shows sinus rhythm with no acute change.  Recommend nuclear stress test for further evaluation.  She does not have any hypoxia, pleuritic pain, tachycardia, hemoptysis, etc to suggest likely PE.  She will start baby aspirin one daily and no further exercise until evaluated.

## 2010-10-21 NOTE — Patient Instructions (Signed)
Follow up immediately for any recurrent chest pain or shortness of breath. No exertional activity until this is checked out. Take 81 mg aspirin one daily.

## 2010-10-25 ENCOUNTER — Emergency Department (HOSPITAL_COMMUNITY): Payer: BC Managed Care – PPO

## 2010-10-25 ENCOUNTER — Observation Stay (HOSPITAL_COMMUNITY)
Admission: EM | Admit: 2010-10-25 | Discharge: 2010-10-27 | DRG: 143 | Disposition: A | Discharge status: Home / Self Care | Payer: BC Managed Care – PPO | Attending: Family Medicine | Admitting: Family Medicine

## 2010-10-25 DIAGNOSIS — R0602 Shortness of breath: Secondary | ICD-10-CM | POA: Insufficient documentation

## 2010-10-25 DIAGNOSIS — F410 Panic disorder [episodic paroxysmal anxiety] without agoraphobia: Secondary | ICD-10-CM | POA: Insufficient documentation

## 2010-10-25 DIAGNOSIS — F411 Generalized anxiety disorder: Secondary | ICD-10-CM | POA: Insufficient documentation

## 2010-10-25 DIAGNOSIS — I059 Rheumatic mitral valve disease, unspecified: Secondary | ICD-10-CM | POA: Insufficient documentation

## 2010-10-25 DIAGNOSIS — Z8249 Family history of ischemic heart disease and other diseases of the circulatory system: Secondary | ICD-10-CM | POA: Insufficient documentation

## 2010-10-25 DIAGNOSIS — R079 Chest pain, unspecified: Principal | ICD-10-CM | POA: Insufficient documentation

## 2010-10-25 DIAGNOSIS — R0789 Other chest pain: Secondary | ICD-10-CM | POA: Insufficient documentation

## 2010-10-25 DIAGNOSIS — R911 Solitary pulmonary nodule: Secondary | ICD-10-CM | POA: Insufficient documentation

## 2010-10-25 DIAGNOSIS — Z96619 Presence of unspecified artificial shoulder joint: Secondary | ICD-10-CM | POA: Insufficient documentation

## 2010-10-25 DIAGNOSIS — E041 Nontoxic single thyroid nodule: Secondary | ICD-10-CM | POA: Insufficient documentation

## 2010-10-25 DIAGNOSIS — E781 Pure hyperglyceridemia: Secondary | ICD-10-CM | POA: Insufficient documentation

## 2010-10-25 LAB — DIFFERENTIAL
Basophils Absolute: 0.1 10*3/uL (ref 0.0–0.1)
Basophils Relative: 1 % (ref 0–1)
Eosinophils Relative: 3 % (ref 0–5)
Lymphocytes Relative: 55 % — ABNORMAL HIGH (ref 12–46)
Lymphs Abs: 3.3 10*3/uL (ref 0.7–4.0)
Monocytes Absolute: 0.3 10*3/uL (ref 0.1–1.0)
Monocytes Relative: 6 % (ref 3–12)
Neutro Abs: 2.1 10*3/uL (ref 1.7–7.7)

## 2010-10-25 LAB — CBC
HCT: 43.1 % (ref 36.0–46.0)
Hemoglobin: 15.2 g/dL — ABNORMAL HIGH (ref 12.0–15.0)
MCH: 33.6 pg (ref 26.0–34.0)
MCHC: 35.3 g/dL (ref 30.0–36.0)
MCV: 95.4 fL (ref 78.0–100.0)
WBC: 6.1 10*3/uL (ref 4.0–10.5)

## 2010-10-25 LAB — D-DIMER, QUANTITATIVE: D-Dimer, Quant: 0.5 ug/mL-FEU — ABNORMAL HIGH (ref 0.00–0.48)

## 2010-10-25 LAB — BASIC METABOLIC PANEL
BUN: 12 mg/dL (ref 6–23)
CO2: 18 mEq/L — ABNORMAL LOW (ref 19–32)
Calcium: 10.4 mg/dL (ref 8.4–10.5)
Creatinine, Ser: 0.73 mg/dL (ref 0.4–1.2)
Glucose, Bld: 89 mg/dL (ref 70–99)

## 2010-10-25 LAB — CK TOTAL AND CKMB (NOT AT ARMC): Total CK: 189 U/L — ABNORMAL HIGH (ref 7–177)

## 2010-10-26 ENCOUNTER — Inpatient Hospital Stay (HOSPITAL_COMMUNITY): Payer: BC Managed Care – PPO

## 2010-10-26 DIAGNOSIS — I059 Rheumatic mitral valve disease, unspecified: Secondary | ICD-10-CM

## 2010-10-26 LAB — BASIC METABOLIC PANEL
CO2: 21 mEq/L (ref 19–32)
Calcium: 8.7 mg/dL (ref 8.4–10.5)
Chloride: 114 mEq/L — ABNORMAL HIGH (ref 96–112)
GFR calc non Af Amer: >60 mL/min (ref >60)
Glucose, Bld: 90 mg/dL (ref 70–99)

## 2010-10-26 LAB — LIPID PANEL
HDL: 58 mg/dL (ref >39)
LDL Cholesterol: 68 mg/dL (ref 0–99)
Total CHOL/HDL Ratio: 3.2 RATIO
Triglycerides: 294 mg/dL — ABNORMAL HIGH (ref <150)
VLDL: 59 mg/dL — ABNORMAL HIGH (ref 0–40)

## 2010-10-26 LAB — CBC
HCT: 39.3 % (ref 36.0–46.0)
Hemoglobin: 13.1 g/dL (ref 12.0–15.0)
MCH: 32.8 pg (ref 26.0–34.0)
MCHC: 33.3 g/dL (ref 30.0–36.0)
MCV: 98.3 fL (ref 78.0–100.0)
RBC: 4 MIL/uL (ref 3.87–5.11)
RDW: 13.4 % (ref 11.5–15.5)

## 2010-10-26 LAB — GLUCOSE, CAPILLARY: Glucose-Capillary: 95 mg/dL (ref 70–99)

## 2010-10-26 LAB — CARDIAC PANEL(CRET KIN+CKTOT+MB+TROPI)
CK, MB: 2.3 ng/mL (ref 0.3–4.0)
Total CK: 138 U/L (ref 7–177)
Troponin I: <0.3 ng/mL (ref <0.30)

## 2010-10-26 LAB — LIPASE, BLOOD: Lipase: 32 U/L (ref 11–59)

## 2010-10-26 LAB — T4, FREE: Free T4: 1.13 ng/dL (ref 0.80–1.80)

## 2010-10-26 LAB — MRSA PCR SCREENING: MRSA by PCR: NEGATIVE

## 2010-10-26 MED ORDER — IOHEXOL 300 MG/ML  SOLN
100.0000 mL | Freq: Once | INTRAMUSCULAR | Status: AC | PRN
  Administered 2010-10-25: 100 mL via INTRAVENOUS

## 2010-10-28 ENCOUNTER — Ambulatory Visit (HOSPITAL_COMMUNITY): Payer: BC Managed Care – PPO | Attending: Family Medicine | Admitting: Radiology

## 2010-10-28 VITALS — Ht 69.0 in | Wt 129.0 lb

## 2010-10-28 DIAGNOSIS — R61 Generalized hyperhidrosis: Secondary | ICD-10-CM | POA: Insufficient documentation

## 2010-10-28 DIAGNOSIS — R0989 Other specified symptoms and signs involving the circulatory and respiratory systems: Secondary | ICD-10-CM | POA: Insufficient documentation

## 2010-10-28 DIAGNOSIS — R002 Palpitations: Secondary | ICD-10-CM | POA: Insufficient documentation

## 2010-10-28 DIAGNOSIS — R0789 Other chest pain: Secondary | ICD-10-CM

## 2010-10-28 DIAGNOSIS — R0609 Other forms of dyspnea: Secondary | ICD-10-CM | POA: Insufficient documentation

## 2010-10-28 DIAGNOSIS — R079 Chest pain, unspecified: Secondary | ICD-10-CM

## 2010-10-28 MED ORDER — TECHNETIUM TC 99M TETROFOSMIN IV KIT
33.0000 | PACK | Freq: Once | INTRAVENOUS | Status: AC | PRN
  Administered 2010-10-28: 33 via INTRAVENOUS

## 2010-10-28 MED ORDER — REGADENOSON 0.4 MG/5ML IV SOLN
0.4000 mg | Freq: Once | INTRAVENOUS | Status: AC
  Administered 2010-10-28: 0.4 mg via INTRAVENOUS

## 2010-10-28 MED ORDER — TECHNETIUM TC 99M TETROFOSMIN IV KIT
10.8000 | PACK | Freq: Once | INTRAVENOUS | Status: AC | PRN
  Administered 2010-10-28: 11 via INTRAVENOUS

## 2010-10-28 NOTE — Progress Notes (Signed)
[facility] SITE 3 NUCLEAR MED [address] [place] [address] [phone]  Cardiology Nuclear Med Study  TAWNEE CLEGG is a 65 y.o. female [id number] April 26, 1946   Nuclear Med Background Indication for Stress Test:  Evaluation for Ischemia and Post Hospital on 10/27/10: CP, (-) enzymes History:  ~25 yrs ago Cath:OK per patient Cardiac Risk Factors: History of Smoking  Symptoms:  Chest Pain/Pressure with and without Exertion (last episode of chest discomfort was Saturday), Diaphoresis, DOE, Palpitations and Rapid HR   Nuclear Pre-Procedure Caffeine/Decaff Intake:  None NPO After: 8:30pm   Lungs:  Clear.  O2 sat 98% on RA. IV 0.9% NS with Angio Cath:  20g  IV Site: L Antecubital  IV Started by:  Irean Hong, RN  Chest Size (in):  34 Cup Size: A  Height: 5\' 9"  (1.753 m)  Weight:  129 lb (58.514 kg)  BMI:  Body mass index is 19.05 kg/(m^2). Tech Comments:  Held propranolol this am    Nuclear Med Study 1 or 2 day study: 1 day  Stress Test Type:  Lexiscan  Reading MD: Arvilla Meres, MD  Order Authorizing Provider:  Rene Kocher, MD  Resting Radionuclide: Technetium 50m Tetrofosmin  Resting Radionuclide Dose: 10.8 mCi   Stress Radionuclide:  Technetium 47m Tetrofosmin  Stress Radionuclide Dose: 33.0 mCi           Stress Protocol Rest HR: 55 Stress HR: 85  Rest BP: 122/77 Stress BP: 115/64  Exercise Time (min): n/a METS: n/a   Predicted Max HR: 158 bpm % Max HR: 53.8 bpm Rate Pressure Product: 9775   Dose of Adenosine (mg):  n/a Dose of Lexiscan: 0.4 mg  Dose of Atropine (mg): n/a Dose of Dobutamine: n/a mcg/kg/min (at max HR)  Stress Test Technologist: Smiley Houseman, CMA-N  Nuclear Technologist:  Domenic Polite, CNMT     Rest Procedure:  Myocardial perfusion imaging was performed at rest 45 minutes following the intravenous administration of Technetium 42m Tetrofosmin. Rest ECG: Sinus bradycardia, no acute changes.  Stress Procedure:  The  patient received IV Lexiscan 0.4 mg over 15-seconds.  Technetium 2m Tetrofosmin injected at 30-seconds.  There were nonspecific T-wave changes with Lexiscan.  She did c/o chest pressure with lexiscan.  Quantitative spect images were obtained after a 45 minute delay. Stress ECG: Inferior T-wave inversion with stress.  QPS Raw Data Images:  Normal; no motion artifact; normal heart/lung ratio. Stress Images:  Normal homogeneous uptake in all areas of the myocardium. Rest Images:  Normal homogeneous uptake in all areas of the myocardium. Subtraction (SDS):  Normal Transient Ischemic Dilatation (Normal <1.22):  1.05 Lung/Heart Ratio (Normal <0.45):  0.28  Quantitative Gated Spect Images QGS EDV:  98 ml QGS ESV:  36 ml QGS cine images:  NL LV Function; NL Wall Motion QGS EF: 63%  Impression Exercise Capacity:  Lexiscan with no exercise. BP Response:  n/a Clinical Symptoms:  n/a ECG Impression:  Inferior T wave inversion with Lexiscan. No ST changes. Comparison with Prior Nuclear Study: No previous nuclear study performed  Overall Impression:  Normal stress nuclear study.     Daniel Bensimhon

## 2010-10-29 NOTE — Progress Notes (Signed)
Copy routed to Dr. Caryl Never.Mirna Mires

## 2010-11-01 NOTE — H&P (Signed)
[name], [name] NO.:  [id number]  MEDICAL RECORD NO.:  [id number]           PATIENT TYPE:  I  LOCATION:  2103                         FACILITY:  [facility]  PHYSICIAN:  Della Goo, M.D. DATE OF BIRTH:  07/02/1945  DATE OF ADMISSION:  10/25/2010 DATE OF DISCHARGE:                             HISTORY & PHYSICAL   PRIMARY CARE PHYSICIAN:  Evelena Peat, MD  CHIEF COMPLAINT:  Chest pain and shortness of breath  HISTORY OF PRESENT ILLNESS:  This is a 65 year old female who presents to the emergency department with complaints of several days of chest pain off and on in the midchest area.  The patient reports that the pain would last minutes.  However, it became worse at about 8 p.m. prior to coming to the emergency department.  She states that she had associated shortness of breath and diaphoresis along with nausea, but no vomiting. She rated the pain at the worst as being a 10/10.  The patient had been seen in the office of her primary care physician 3 days ago secondary to complaints of shortness of breath and the patient was referred to [facility] and had an upcoming appointment on Tuesday, Oct 28, 2010.  The patient was evaluated in the emergency department and cardiac enzymes were performed.  The first set were negative.  The patient also had a D-dimer performed which was mildly elevated at 0.50, so a CT angiogram study of the chest with contrast was performed which was found to be negative for evidence of pulmonary emboli, also negative for evidence of thoracic aortic aneurysm or dissection and no mediastinal or hilar masses were seen.  However, of note, a complex cystic and solid nodule was seen in the right thyroid lobe and 2 noncalcified irregular pulmonary nodules were seen in the left lower lobe measuring 13 and 14 mm in diameter.  The patient was referred for medical admission.  PAST MEDICAL HISTORY:  Significant for mitral valve  prolapse.  PAST SURGICAL HISTORY:  History of hysterectomy with bilateral salpingo- oophorectomy secondary to endometriosis, appendectomy, and tonsillectomy.  MEDICATIONS:  Will need to be further verified.  She is taking: 1. Robaxin 500 mg p.o. b.i.d. p.r.n. 2. Temazepam 30 mg p.o. every night. 3. Naprosyn 500 mg p.o. b.i.d. for pain. 4. Aspirin 81 mg 1 p.o. daily. 5. Ambien 10 mg p.o. every night. 6. Promethazine VC 25 mg p.r.n.  ALLERGIES:  No known drug allergies.  SOCIAL HISTORY:  The patient is married.  She is a former smoker and quit when she was in her 40s.  She drinks alcohol 2 glasses of wine a night.  She denies any illicit drug usage.  FAMILY HISTORY:  Positive for coronary artery disease in her father.  REVIEW OF SYSTEMS:  Pertinent as mentioned above.  The patient denies having any fevers or chills.  Denies any weight loss, syncope, or seizures.  No problems with her bowels or urine.  All other organ systems other than what is mentioned above are negative.  The patient had a recent rotator cuff surgery of the right shoulder performed by orthopedic  surgeon, Dr. Rennis Chris on October 03, 2010.  PHYSICAL EXAMINATION FINDINGS:  GENERAL:  This is a 65 year old well- nourished, well-developed Caucasian female who is in discomfort, but no acute distress.  The patient is agitated and nervous. VITAL SIGNS:  Temperature 98.2; blood pressure 158/82; heart rate 103 initially, now 74; respirations initially 40, now 17; and O2 sats 100%. HEENT:  Normocephalic and atraumatic.  Pupils are equally round and reactive to light.  Extraocular movements are intact.  Funduscopic benign.  There is no scleral icterus.  Nares are patent bilaterally. Oropharynx is clear. NECK:  Supple.  Full range of motion.  No thyromegaly, adenopathy, or jugular venous distention. CARDIOVASCULAR:  Regular rate and rhythm.  No murmurs, gallops, or rubs. LUNGS:  Clear to auscultation bilaterally.  No rales,  rhonchi, or wheezes. ABDOMEN:  Positive bowel sounds, soft, nontender, nondistended.  No hepatosplenomegaly. EXTREMITIES:  Without cyanosis, clubbing, or edema. NEUROLOGIC:  The patient is alert and oriented x3.  There are no focal deficits.  LABORATORY STUDIES:  White blood cell count 6.1, hemoglobin 15.2, hematocrit 43.1, MCV 95.4, platelets 378, neutrophils 35%, and lymphocytes 55%.  Sodium 142, potassium 3.5, chloride 107, carbon dioxide 18, BUN 12, creatinine 0.73, and glucose 89.  D-dimer 0.50. Cardiac enzymes with a CK of 189, CK-MB 3.1, relative index 1.6, and troponin less than 0.30.  Second set of cardiac enzymes with a total CK of 144, CK-MB 2.8, relative index 1.9, and troponin less than 0.30. Chest x-ray reveals clear lung fields and no acute disease process.  CT angiogram results as mentioned above.  EKG performed and the automatic review read this is being an atrial fibrillation, however, this is regularly regular and there are distinctive P-waves that are seen.  This is read by me as being normal sinus rhythm with possible artifact and no acute ST-segment changes are seen.  ASSESSMENT:  This is a 65 year old female being admitted with: 1. Chest pain. 2. Shortness of breath. 3. Right thyroid area cystic mass. 4. Small left lower lobe pulmonary nodules. 5. History of mitral valve prolapse. 6. Recent right rotator cuff surgery. 7. Anxiety.  PLAN:  The patient will be admitted to the Coronary Care Unit secondary to unrelieved chest pain by nitro paste and pain medications that have been administered thus far.  The patient has been placed on an IV nitroglycerin drip at this time and this will be titrated to pain as her blood pressure permits.  Cardiac enzymes will be performed.  The patient has been placed on aspirin therapy as well.  IV Protonix therapy has been ordered daily and a p.r.n. Ativan order has also been written. Fasting lipids will be performed and a TSH  level will be ordered as well.  A 2-D echo study will also be ordered to evaluate the patient's mitral valve prolapse, however, the patient may require a TEE. A thyroid ultrasound study will also be ordered secondary to the cystic area in the right lobe.  Further evaluations as an inpatient and consultations will be deferred to the rounding team for further evaluation of the pulmonary nodules.  For the patient's anxiety, IV Ativan therapy has also been ordered.  The patient is a full code.     Della Goo, M.D.     HJ/MEDQ  D:  10/26/2010  T:  10/26/2010  Job:  [id number]  cc:   Evelena Peat, M.D.  Electronically Signed by Della Goo M.D. on 11/01/2010 07:49:22 PM

## 2010-11-04 ENCOUNTER — Other Ambulatory Visit: Payer: Self-pay | Admitting: Otolaryngology

## 2010-11-04 ENCOUNTER — Other Ambulatory Visit (HOSPITAL_COMMUNITY)
Admission: RE | Admit: 2010-11-04 | Discharge: 2010-11-04 | Disposition: A | Discharge status: Home / Self Care | Payer: BC Managed Care – PPO | Source: Ambulatory Visit | Attending: Otolaryngology | Admitting: Otolaryngology

## 2010-11-04 DIAGNOSIS — E049 Nontoxic goiter, unspecified: Secondary | ICD-10-CM | POA: Insufficient documentation

## 2010-11-13 ENCOUNTER — Other Ambulatory Visit: Payer: Self-pay | Admitting: Otolaryngology

## 2010-11-13 ENCOUNTER — Encounter: Payer: Self-pay | Admitting: Internal Medicine

## 2010-11-13 DIAGNOSIS — E041 Nontoxic single thyroid nodule: Secondary | ICD-10-CM

## 2010-11-14 ENCOUNTER — Inpatient Hospital Stay: Payer: BC Managed Care – PPO | Admitting: Internal Medicine

## 2010-11-17 ENCOUNTER — Telehealth: Payer: Self-pay | Admitting: Internal Medicine

## 2010-11-17 NOTE — Telephone Encounter (Signed)
She was supposetd to see me 5/25 as new patint for LLL pulm nodule ? What happened ?

## 2010-11-19 NOTE — Discharge Summary (Signed)
NAMESAIGE, [name]                  ACCOUNT NO.:  [id number]  MEDICAL RECORD NO.:  [id number]           PATIENT TYPE:  I  LOCATION:  2038                         FACILITY:  [facility]  PHYSICIAN:  [name]         DATE OF BIRTH:  15-Sep-1945  DATE OF ADMISSION:  10/25/2010 DATE OF DISCHARGE:  10/27/2010                              DISCHARGE SUMMARY   ADMISSION DIAGNOSES: 1. Chest pain. 2. Shortness of breath. 3. Right thyroid area cystic mass. 4. Small left lower lobe pulmonary nodules. 5. History of mitral valve prolapse. 6. Anxiety.  DISCHARGE DIAGNOSES: 1. Atypical chest pain, resolved, most likely due to anxiety and panic     attack. 2. Right thyroid complex nodule.  The patient to have biopsy as     outpatient with the ENT. 3. Pulmonary nodules.  The patient to have followup with Pulmonary as     outpatient. 4. Hypertriglyceridemia.  The patient will use the diet modification. 5. Anxiety and panic attack.  The patient already on temazepam at     bedtime.  We will discuss with the primary care regarding SSRI     versus p.r.n. benzodiazepines for the panic disorder/anxiety.  Tests performed during the hospital stay include: 1. Chest x-ray on Oct 25, 2010, showed no active disease. 2. CT angio of the chest on Oct 26, 2010, showed no evidence of     pulmonary embolism, 2 calcified left lower lobe pulmonary nodules     measuring 13 and 14 mm in size.  Malignancy cannot be excluded.     PET scan recommended for further evaluation.  Next is 2-cm complex     right thyroid nodule. 3. Ultrasound of the thyroid showed complex solid and cystic nodule in     the mid pole region of the right thyroid, contains internal fluid,     warrants further evaluation.  Smaller nodules identified in the     lower region in the left lobe.  PERTINENT LABORATORY DATA:  The patient had a D-dimer elevated of 0.50, so a CT angio was done which was negative for PE.  Three sets of cardiac enzymes are  negative.  EKG was normal.  Lipid profile showed cholesterol of 185, triglyceride 294, HDL 58, LDL 68.  Lipase of the blood is 32. TSH was 2.75, free T4 is 1.13.  BRIEF HISTORY AND PHYSICAL:  A 65 year old female who came to the hospital because of chest pain off and on in the mid chest area that lasts for minutes.  The patient was admitted and also had elevated D- dimer which was 0.50, so a CT angio was done which was negative for PE. However, the patient was found to have pulmonary nodules as well as thyroid complex cystic nodule, so the patient was admitted for further evaluation.  BRIEF HOSPITAL COURSE: 1. Chest pain.  The patient's chest pain resolved.  The patient     received pain medications and it appears most like anxiety and     panic attack as the cardiac enzymes x3 were negative.  The patient  admits to having some similar attacks in the past.  So, the patient     will benefit from either SSRI or a benzodiazepine like Xanax to be     taken during the attack.  The patient will discuss this further     with her primary care physician.  Her 2-D echo was done in the     hospital which actually showed the patient had an EF of 55-60%.     The patient had Barlow syndrome with severe myxomatous changes and     thickening of both the leaflets of the mitral valve.  By leaflet     prolapse with overall moderate-to-severe eccentric mitral     regurgitation.  The patient already has an appointment to see     [facility] tomorrow and the patient has been told to keep     that appointment.  She will discuss the echocardiogram results with     the Cardiology tomorrow that is Oct 28, 2010. 2. Thyroid nodule.  The patient's ultrasound showed complex thyroid     nodule and warrants further evaluation, so I called up and     discussed with the ENT surgeon on call.  The patient has seen Dr.     Jenne Liu in the past and the patient will call [name]' office to     make an appointment  for the biopsy of thyroid nodule. 3. Lung nodule.  I have called up and made an appointment with Dr.     Marchelle Liu.  The patient will see [name] on Nov 14, 2010, at     1:30 p.m. for further evaluation of the pulmonary nodules. 4. Hypertriglyceridemia.  The patient was found to have high     triglyceride of 294 and I am going to advise lifestyle modification     with dietary modification and low fat intake.  The patient should     get the fasting lipid profile recheck in 3 months' time and primary     care can follow up on that.  Make a note that the patient was     started in the hospital on propranolol 10 mg p.o. t.i.d. for the     palpitations and I am going to continue the patient on this     medication.  The patient's blood pressure has been stable on this     medication.  On the day of discharge, her temperature 98.2, pulse     60, respirations 20, blood pressure 125/77.  Medications on discharge include: 1. Propranolol 10 mg p.o. t.i.d. 2. Ambien 5 mg p.o. nightly.     [name]     GL/MEDQ  D:  10/27/2010  T:  10/27/2010  Job:  [id number]  cc:   Dr. Georgeanna Lea, Liu [name], M.D. [facility]  Electronically Signed by [name]  on 11/19/2010 12:07:59 PM

## 2010-11-21 NOTE — Telephone Encounter (Signed)
Pt set to see MR on 11-25-10. Carron Curie, CMA

## 2010-11-21 NOTE — Telephone Encounter (Signed)
[email or web address].[name], CMA  

## 2010-11-25 ENCOUNTER — Encounter: Payer: Self-pay | Admitting: Internal Medicine

## 2010-11-25 ENCOUNTER — Ambulatory Visit (INDEPENDENT_AMBULATORY_CARE_PROVIDER_SITE_OTHER): Payer: BC Managed Care – PPO | Admitting: Internal Medicine

## 2010-11-25 VITALS — BP 110/78 | HR 63 | Temp 98.0°F | Ht 69.0 in | Wt 126.4 lb

## 2010-11-25 DIAGNOSIS — I059 Rheumatic mitral valve disease, unspecified: Secondary | ICD-10-CM

## 2010-11-25 DIAGNOSIS — R911 Solitary pulmonary nodule: Secondary | ICD-10-CM

## 2010-11-25 DIAGNOSIS — I341 Nonrheumatic mitral (valve) prolapse: Secondary | ICD-10-CM

## 2010-11-25 DIAGNOSIS — J984 Other disorders of lung: Secondary | ICD-10-CM

## 2010-11-25 NOTE — Patient Instructions (Signed)
#  Spot in lung - called nodules in left lung lower part   - Please have PET CT chest along with SUPER DIMENSION CT CHEST PROTOCOL in 2 weeks from now #MITRAL VALVE PROLPASE  - Please see [place] cardiologist  Dr. Marca Ancona  #FOLLOWUP   - Return in 2 weeks after CT chest

## 2010-11-25 NOTE — Progress Notes (Signed)
Subjective:    Patient ID: [name], female    DOB: Nov 15, 1945, 65 y.o.   MRN: [id number]  HPI 65 year old former smoker (age [age], 1 pack a day) with "mild" mitral valve prolapse per hx for many years (followed by cardiologist in [place] till 3 years ago when she moved to [place], on propranalol since may 2012). On October 03, 2010 had right shoulder surgery (rotator cuff due to tennis, Dr Rennis Chris).  Admitted 10/26/2010 through 11/01/2010 for atypical sternal chest pain and dyspnea. Dxed as anxiety or mitral valve related (ECHO 10/26/2010 shows moderate- severe MR but she is unaware of severity). PE and MI ruled out by workup but CT chest 10/26/2010  showed 2 x LLL 1.3 and 1.4 cm non-calcified nodules. Therefore, referred here mainly for pulmonary nodules. Currently denies dyspnea but still with occ chest pains that are transient and fleeting. Denies wheeze or cough or hemoptysis, fever, chills, weight loss, syncope. Family hx of cancer + - dad with tobacco, etoh and  head and neck, grandmother -breast cancer. No family hx of CAD. 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  HENT: Negative for ear pain, nosebleeds, congestion, sore throat, rhinorrhea, sneezing, trouble swallowing, dental problem, postnasal drip and sinus pressure.   Eyes: Negative for redness and itching.  Respiratory: Negative for cough, chest tightness, shortness of breath and wheezing.   Cardiovascular: Negative for palpitations and leg swelling.  Gastrointestinal: Negative for nausea and vomiting.  Genitourinary: Negative for dysuria.  Musculoskeletal: Negative for joint swelling.  Skin: Negative for rash.  Neurological: Negative for headaches.  Hematological: Does not bruise/bleed easily.  Psychiatric/Behavioral: Negative for dysphoric mood. The patient is not nervous/anxious.       Objective:   Physical Exam  Vitals reviewed. Constitutional: She is oriented to person, place, and time. She appears well-developed and well-nourished. No distress.  HENT:  Head: Normocephalic and atraumatic.  Right Ear: External ear normal.  Left Ear: External ear normal.  Mouth/Throat: Oropharynx is clear and moist. No oropharyngeal exudate.  Eyes: Conjunctivae and EOM are normal. Pupils are equal, round, and reactive to light. Right eye exhibits no discharge. Left eye exhibits no discharge. No scleral icterus.  Neck: Normal range of motion. Neck supple. No JVD present. No tracheal deviation present. No thyromegaly present.  Cardiovascular: Normal rate, regular rhythm and intact distal pulses.  Exam reveals no gallop and no friction rub.   Murmur heard.      Pan systolic murmur +  Pulmonary/Chest: Effort normal and breath sounds normal. No respiratory distress. She has no wheezes. She has no rales. She exhibits no tenderness.  Abdominal: Soft. Bowel sounds are normal. She exhibits no distension and no mass. There is no tenderness. There is no rebound and no guarding.  Musculoskeletal: Normal range of motion. She exhibits no edema and no tenderness.  Lymphadenopathy:    She has no cervical adenopathy.  Neurological: She is alert and oriented to person, place, and time. She has normal reflexes. No cranial nerve deficit. She exhibits normal muscle tone. Coordination normal.  Skin: Skin is warm and dry. No rash noted. She is not diaphoretic. No erythema. No pallor.  Psychiatric: She has a normal mood and affect. Her behavior is normal. Judgment and thought content normal.          Assessment & Plan:

## 2010-11-26 ENCOUNTER — Other Ambulatory Visit (HOSPITAL_COMMUNITY)
Admission: RE | Admit: 2010-11-26 | Discharge: 2010-11-26 | Disposition: A | Discharge status: Home / Self Care | Payer: BC Managed Care – PPO | Source: Ambulatory Visit | Attending: Diagnostic Radiology | Admitting: Diagnostic Radiology

## 2010-11-26 ENCOUNTER — Ambulatory Visit
Admission: RE | Admit: 2010-11-26 | Discharge: 2010-11-26 | Disposition: A | Discharge status: Home / Self Care | Payer: BC Managed Care – PPO | Source: Ambulatory Visit | Attending: Otolaryngology | Admitting: Otolaryngology

## 2010-11-26 ENCOUNTER — Other Ambulatory Visit: Payer: Self-pay | Admitting: Diagnostic Radiology

## 2010-11-26 DIAGNOSIS — E041 Nontoxic single thyroid nodule: Secondary | ICD-10-CM

## 2010-11-26 DIAGNOSIS — E049 Nontoxic goiter, unspecified: Secondary | ICD-10-CM | POA: Insufficient documentation

## 2010-12-01 ENCOUNTER — Ambulatory Visit (HOSPITAL_COMMUNITY)
Admission: RE | Admit: 2010-12-01 | Discharge: 2010-12-01 | Disposition: A | Discharge status: Home / Self Care | Payer: BC Managed Care – PPO | Source: Ambulatory Visit | Attending: Family Medicine | Admitting: Family Medicine

## 2010-12-01 DIAGNOSIS — Z1231 Encounter for screening mammogram for malignant neoplasm of breast: Secondary | ICD-10-CM | POA: Insufficient documentation

## 2010-12-07 ENCOUNTER — Encounter: Payer: Self-pay | Admitting: Internal Medicine

## 2010-12-07 DIAGNOSIS — I341 Nonrheumatic mitral (valve) prolapse: Secondary | ICD-10-CM | POA: Insufficient documentation

## 2010-12-07 DIAGNOSIS — R911 Solitary pulmonary nodule: Secondary | ICD-10-CM | POA: Insufficient documentation

## 2010-12-07 NOTE — Assessment & Plan Note (Signed)
#  Spot in lung - called nodules in left lung lower part   - Please have PET CT chest along with SUPER DIMENSION CT CHEST PROTOCOL in 2 weeks from now

## 2010-12-07 NOTE — Assessment & Plan Note (Signed)
#  MITRAL VALVE PROLPASE  - Please see [place] cardiologist  Dr. Marca Ancona . I am concerned about significant valvular regurg

## 2010-12-12 ENCOUNTER — Ambulatory Visit (HOSPITAL_COMMUNITY)
Admission: RE | Admit: 2010-12-12 | Discharge: 2010-12-12 | Disposition: A | Discharge status: Home / Self Care | Payer: BC Managed Care – PPO | Source: Ambulatory Visit | Attending: Internal Medicine | Admitting: Internal Medicine

## 2010-12-12 ENCOUNTER — Encounter (HOSPITAL_COMMUNITY): Payer: Self-pay

## 2010-12-12 ENCOUNTER — Other Ambulatory Visit (HOSPITAL_COMMUNITY): Payer: BC Managed Care – PPO

## 2010-12-12 ENCOUNTER — Encounter (HOSPITAL_COMMUNITY)
Admission: RE | Admit: 2010-12-12 | Discharge: 2010-12-12 | Disposition: A | Discharge status: Home / Self Care | Payer: BC Managed Care – PPO | Source: Ambulatory Visit | Attending: Internal Medicine | Admitting: Internal Medicine

## 2010-12-12 DIAGNOSIS — J984 Other disorders of lung: Secondary | ICD-10-CM | POA: Insufficient documentation

## 2010-12-12 DIAGNOSIS — E0789 Other specified disorders of thyroid: Secondary | ICD-10-CM | POA: Insufficient documentation

## 2010-12-12 DIAGNOSIS — R911 Solitary pulmonary nodule: Secondary | ICD-10-CM

## 2010-12-12 DIAGNOSIS — K7689 Other specified diseases of liver: Secondary | ICD-10-CM | POA: Insufficient documentation

## 2010-12-12 DIAGNOSIS — I708 Atherosclerosis of other arteries: Secondary | ICD-10-CM | POA: Insufficient documentation

## 2010-12-12 DIAGNOSIS — R222 Localized swelling, mass and lump, trunk: Secondary | ICD-10-CM | POA: Insufficient documentation

## 2010-12-12 DIAGNOSIS — I700 Atherosclerosis of aorta: Secondary | ICD-10-CM | POA: Insufficient documentation

## 2010-12-12 DIAGNOSIS — M47814 Spondylosis without myelopathy or radiculopathy, thoracic region: Secondary | ICD-10-CM | POA: Insufficient documentation

## 2010-12-12 DIAGNOSIS — M47817 Spondylosis without myelopathy or radiculopathy, lumbosacral region: Secondary | ICD-10-CM | POA: Insufficient documentation

## 2010-12-12 DIAGNOSIS — Z85820 Personal history of malignant melanoma of skin: Secondary | ICD-10-CM | POA: Insufficient documentation

## 2010-12-12 DIAGNOSIS — M47812 Spondylosis without myelopathy or radiculopathy, cervical region: Secondary | ICD-10-CM | POA: Insufficient documentation

## 2010-12-12 MED ORDER — FLUDEOXYGLUCOSE F - 18 (FDG) INJECTION
18.0000 | Freq: Once | INTRAVENOUS | Status: AC | PRN
  Administered 2010-12-12: 18 via INTRAVENOUS

## 2010-12-15 ENCOUNTER — Ambulatory Visit (INDEPENDENT_AMBULATORY_CARE_PROVIDER_SITE_OTHER): Payer: BC Managed Care – PPO | Admitting: Cardiology

## 2010-12-15 ENCOUNTER — Encounter: Payer: Self-pay | Admitting: *Deleted

## 2010-12-15 ENCOUNTER — Encounter: Payer: Self-pay | Admitting: Cardiology

## 2010-12-15 DIAGNOSIS — I340 Nonrheumatic mitral (valve) insufficiency: Secondary | ICD-10-CM | POA: Insufficient documentation

## 2010-12-15 DIAGNOSIS — R0609 Other forms of dyspnea: Secondary | ICD-10-CM

## 2010-12-15 DIAGNOSIS — I341 Nonrheumatic mitral (valve) prolapse: Secondary | ICD-10-CM

## 2010-12-15 DIAGNOSIS — R0989 Other specified symptoms and signs involving the circulatory and respiratory systems: Secondary | ICD-10-CM

## 2010-12-15 DIAGNOSIS — I059 Rheumatic mitral valve disease, unspecified: Secondary | ICD-10-CM

## 2010-12-15 DIAGNOSIS — R079 Chest pain, unspecified: Secondary | ICD-10-CM | POA: Insufficient documentation

## 2010-12-15 DIAGNOSIS — R002 Palpitations: Secondary | ICD-10-CM | POA: Insufficient documentation

## 2010-12-15 LAB — GLUCOSE, CAPILLARY: Glucose-Capillary: 95 mg/dL (ref 70–99)

## 2010-12-15 MED ORDER — LISINOPRIL 5 MG PO TABS: 5.0000 mg | ORAL_TABLET | Freq: Every day | ORAL | Status: DC

## 2010-12-15 NOTE — Assessment & Plan Note (Signed)
As significant MR can certainly be associated with atrial fibrillation and she feels frequent fluttering/irregularity, I will get a 48 hour holter monitor to look for any paroxysmal atrial fibrillation.

## 2010-12-15 NOTE — Assessment & Plan Note (Signed)
Atypical, normal myoview.  Suspect this was noncardiac.

## 2010-12-15 NOTE — Progress Notes (Signed)
PCP: Dr. Caryl Never  65 yo with history of mitral valve prolapse presents for evaluation of mitral regurgitation.  Patient has been told that she has mitral valve prolapse since she was a teenager.  She has been taking propranolol . She was admitted in 5/12 with neck/chest tightness and severe dyspnea. CTA chest showed no PE but did show a pulmonary nodule.  She ruled out for MI.  I reviewed her echo from that admission today, which showed bileaflet MV prolapse with moderate to severe mitral regurgitation.  LV size and systolic function was normal.  Myoview as outpatient showed no ischemia or infarction.    Patient has had "little episodes" of dyspnea since she was in the hospital.  These are brief and nonexertional (no trigger).  They are associated with brief chest pressure.  She feels considerably better than before she went in the hospital.  She does not get exertional symptoms.  She says that she can walk 5 miles without problems.  She can walk up steps without problems.  She gets occasional palpitations where she feels her heart flutter and beat irregularly.    She has had a pulmonary evaluation with Dr. Marchelle Gearing.  She has a lung nodule and has been set up for a PET-CT.   Labs (5/12): TSH normal, LDL 68, HDL 58, cardiac enzymes negative x 3  PMH:  1. Mitral valve prolapse: Known since teenager.  Echo (5/12): EF 60%, normal LV size, grade II diastolic dysfunction, myxomatous mitral valve with bileaflet prolapse and moderate to severe mitral regurgitation, mild to moderate TR, PA systolic pressure 39 mmHg.  2. Right shoulder surgery 2012.  3. Pulmonary nodule: followed by Dr. Marchelle Gearing 4. Thyroid nodule s/p biopsy.  5. TAH-BSO due to endometriosis.  6. Appendectomy.  7. Lexiscan myoview (6/12):  EF 63%, no ischemia or infarction.    SH: Quit smoking at age 56.  Moved to [place] from [place] several years ago.  Married.  2 glasses wine/night.   FH: No CAD or valvular disease.   ROS: all systems  reviewed and negative except as per HPI.   Current Outpatient Prescriptions  Medication Sig Dispense Refill  . propranolol (INDERAL) 10 MG tablet Take 1 tablet by mouth Three times a day.      . zolpidem (AMBIEN) 10 MG tablet TAKE 1 TABLET BY MOUTH EVERY NIGHT AT BEDTIME  30 tablet  5  . lisinopril (PRINIVIL,ZESTRIL) 5 MG tablet Take 1 tablet (5 mg total) by mouth daily.  30 tablet  11    BP 120/80  Pulse 74  Resp 16  Ht 5\' 9"  (1.753 m)  Wt 127 lb 12.8 oz (57.97 kg)  BMI 18.87 kg/m2 General: NAD Neck: No JVD, no thyromegaly or thyroid nodule.  Lungs: Clear to auscultation bilaterally with normal respiratory effort. CV: Nondisplaced PMI.  Heart regular S1/S2, no S3/S4, 3/6 mid to late systolic murmur at apex.  No peripheral edema.  No carotid bruit.  Normal pedal pulses.  Abdomen: Soft, nontender, no hepatosplenomegaly, no distention.  Skin: Intact without lesions or rashes.  Neurologic: Alert and oriented x 3.  Psych: Normal affect. Extremities: No clubbing or cyanosis.  HEENT: Normal.

## 2010-12-15 NOTE — Patient Instructions (Signed)
Your physician recommends that you schedule a follow-up appointment in: 2 weeks with dr Shirlee Latch  Your physician has recommended you make the following change in your medication: START LISINOPRIL 5 MG 1 EVERY DAY  Your physician has recommended that you wear a holter monitor. Holter monitors are medical devices that record the heart's electrical activity. Doctors most often use these monitors to diagnose arrhythmias. Arrhythmias are problems with the speed or rhythm of the heartbeat. The monitor is a small, portable device. You can wear one while you do your normal daily activities. This is usually used to diagnose what is causing palpitations/syncope (passing out). 48 HOUR HOLTER DX 785.1  Your physician has requested that you have a TEE. During a TEE, sound waves are used to create images of your heart. It provides your doctor with information about the size and shape of your heart and how well your heart's chambers and valves are working. In this test, a transducer is attached to the end of a flexible tube that's guided down your throat and into your esophagus (the tube leading from you mouth to your stomach) to get a more detailed image of your heart. You are not awake for the procedure. Please see the instruction sheet given to you today. For further information please visit [email or web address]  Your physician recommends that you return for lab work in: BNP TODAY  DX 786.09  424.0  BMET IN 2 WEEKS  DX 786.09

## 2010-12-15 NOTE — Assessment & Plan Note (Signed)
Patient has bileaflet mitral valve prolapse with moderate to severe mitral regurgitation.  She has been having periodic episodes of dyspnea that are brief and not associated with exertion.  She really does not seem to have much exertional dyspnea.  Her LV is normal in size with EF 60%.  However, diastolic function is moderately impaired (grade II) and she has mild pulmonary hypertension, so she may have significant hemodynamic effect from the MR.  I am going to set her up for a TEE to look more closely at the valve.  MVP with only late mitral regurgitation may not be as symptomatic or progressive as more holosystolic MR (she has mid to late systolic MR by exam).   - TEE later this week.  - Check BNP for evidence of volume overload.  - Add lisinopril 5 mg daily for afterload reduction, BMET 2 wks.  - If MR looks truly severe by TEE, will arrange for exercise treadmill test to see if exercise capacity is impaired.  She has normal LV size with EF about 60% so could potentially watch asymptomatic severe MR as long as there is no flail segment.

## 2010-12-16 ENCOUNTER — Telehealth: Payer: Self-pay | Admitting: Internal Medicine

## 2010-12-16 NOTE — Telephone Encounter (Signed)
Advised the pt of results of ct scan. Pt set to see MR on Monday. Carron Curie, CMA

## 2010-12-17 ENCOUNTER — Encounter (INDEPENDENT_AMBULATORY_CARE_PROVIDER_SITE_OTHER): Payer: BC Managed Care – PPO

## 2010-12-17 DIAGNOSIS — R002 Palpitations: Secondary | ICD-10-CM

## 2010-12-19 ENCOUNTER — Encounter: Payer: Self-pay | Admitting: *Deleted

## 2010-12-19 ENCOUNTER — Encounter: Payer: Self-pay | Admitting: Cardiology

## 2010-12-19 ENCOUNTER — Telehealth: Payer: Self-pay | Admitting: Cardiology

## 2010-12-19 ENCOUNTER — Ambulatory Visit (HOSPITAL_COMMUNITY)
Admission: RE | Admit: 2010-12-19 | Discharge: 2010-12-19 | Disposition: A | Discharge status: Home / Self Care | Payer: BC Managed Care – PPO | Source: Ambulatory Visit | Attending: Cardiology | Admitting: Cardiology

## 2010-12-19 DIAGNOSIS — I059 Rheumatic mitral valve disease, unspecified: Secondary | ICD-10-CM

## 2010-12-19 NOTE — Telephone Encounter (Signed)
Pt needs to know if she is to take her meds this am before her TEE

## 2010-12-19 NOTE — Telephone Encounter (Signed)
Pt has already had clear liquid breakfast and last drank fluids around 8:30am.  She will take her am meds after her procedure. She is completely NPO now. Mylo Red RN

## 2010-12-22 ENCOUNTER — Encounter: Payer: Self-pay | Admitting: Internal Medicine

## 2010-12-22 ENCOUNTER — Ambulatory Visit (INDEPENDENT_AMBULATORY_CARE_PROVIDER_SITE_OTHER): Payer: BC Managed Care – PPO | Admitting: Internal Medicine

## 2010-12-22 VITALS — BP 114/72 | HR 64 | Temp 97.9°F | Ht 69.0 in | Wt 130.2 lb

## 2010-12-22 DIAGNOSIS — R911 Solitary pulmonary nodule: Secondary | ICD-10-CM

## 2010-12-22 DIAGNOSIS — J984 Other disorders of lung: Secondary | ICD-10-CM

## 2010-12-22 NOTE — Patient Instructions (Addendum)
I am going to discuss which biopsy method is best for you and get back to you so we can take a decision on approach .................... UPDATE: POST CLINIC: DISCUSSED WITH DR Irish Lack OF IR: HE CAN ONLY GET TO 1 LESION BY CT GUIDED TTNA. DISCUSSED WITH DR Edwyna Shell VIA [facility] COORDINATIOR; ENB POSSIBLE. OPTIONS GIVEN OVER PHONE TO PATIENT OF WAIT/MONITOR VERSUS ENB APPROACH. SHE WILL THINK IT OVER

## 2010-12-22 NOTE — Progress Notes (Signed)
  Subjective:    Patient ID: [name], female    DOB: [birth year], 65 y.o.   MRN: [id number]  HPI followup ct of lll nodules of lung in ex-smoker  She has 6 week followup pet ct chest . Very low to no uptake of LLL nodules (independently reviewed).  She has seen Dr Shirlee Latch for mitral regurug; workup nearly complete and it appears she is being reassured and to take a conservative approach. No new complaints. Mainly today is to discuss results   Review of Systems  Constitutional: Negative for fever and unexpected weight change.  HENT: Negative for ear pain, nosebleeds, congestion, sore throat, rhinorrhea, sneezing, trouble swallowing, dental problem, postnasal drip and sinus pressure.   Eyes: Negative for redness and itching.  Respiratory: Negative for cough, chest tightness, shortness of breath and wheezing.   Cardiovascular: Negative for palpitations and leg swelling.  Gastrointestinal: Negative for nausea and vomiting.  Genitourinary: Negative for dysuria.  Musculoskeletal: Negative for joint swelling.  Skin: Negative for rash.  Neurological: Negative for headaches.  Hematological: Does not bruise/bleed easily.  Psychiatric/Behavioral: Negative for dysphoric mood. The patient is not nervous/anxious.        Objective:   Physical Exam    discussion only visit. No physical exam    Assessment & Plan:

## 2010-12-25 ENCOUNTER — Ambulatory Visit (INDEPENDENT_AMBULATORY_CARE_PROVIDER_SITE_OTHER): Payer: BC Managed Care – PPO | Admitting: Cardiology

## 2010-12-25 DIAGNOSIS — R002 Palpitations: Secondary | ICD-10-CM

## 2010-12-25 MED ORDER — LOSARTAN POTASSIUM 25 MG PO TABS: 25.0000 mg | ORAL_TABLET | Freq: Every day | ORAL | Status: DC

## 2010-12-25 NOTE — Patient Instructions (Signed)
Your physician has recommended you make the following change in your medication  Stop Lisinopril Start Losartan 25mg  daily  Your physician recommends that you schedule a follow-up appointment in: 1 year with Dr. Shirlee Latch and also have an echocardiogram Your physician has requested that you have an echocardiogram. Echocardiography is a painless test that uses sound waves to create images of your heart. It provides your doctor with information about the size and shape of your heart and how well your heart's chambers and valves are working. This procedure takes approximately one hour. There are no restrictions for this procedure. In 1 year

## 2010-12-25 NOTE — Progress Notes (Signed)
Exercise Treadmill Test  Pre-Exercise Testing Evaluation Rhythm: normal sinus  Rate: 67   PR:  .14 QRS:  .08  QT:  .40 QTc: .42     Test  Exercise Tolerance Test Ordering MD: Marca Ancona, MD  Interpreting MD:  Marca Ancona, MD  Unique Test No: 1  Treadmill:  1  Indication for ETT: chest pain - rule out ischemia  Contraindication to ETT: No   Stress Modality: exercise - treadmill  Cardiac Imaging Performed: non   Protocol: standard Bruce - maximal  Max BP:  185/81  Max MPHR (bpm):  158 85% MPR (bpm):  134  MPHR obtained (bpm):  162 % MPHR obtained:  102  Reached 85% MPHR (min:sec):  9:33 Total Exercise Time (min-sec):  12:19  Workload in METS:  13.9 Borg Scale: 15  Reason ETT Terminated:  Fatigue, no chest pain    ST Segment Analysis At Rest: normal ST segments - no evidence of significant ST depression With Exercise: no evidence of significant ST depression  Other Information Arrhythmia:  No Angina during ETT:  absent (0) Quality of ETT:  diagnostic  ETT Interpretation:  normal - no evidence of ischemia by ST analysis.  Good exercise tolerance.   Comments: No evidence for symptoms from mitral regurgitation. Repeat echo in 1 year.  Given chronic cough, will change lisinopril to losartan 25 mg daily.

## 2010-12-25 NOTE — Progress Notes (Signed)
Addended by: Mylo Red F on: 12/25/2010 09:00 AM   Modules accepted: Orders

## 2010-12-26 ENCOUNTER — Telehealth: Payer: Self-pay | Admitting: Internal Medicine

## 2010-12-26 NOTE — Telephone Encounter (Signed)
[name],   Please call patient. Say that radiologist feels he can get only 1 nodule with ct guided biopsy. AT conference, feeling was electronavigational bronch under anesthesia is better option. This was her less preferred option. She can think about it. She can get back to [place] after I get back. No rush.  If she wants to wait, we can repeat CT chest without contrast in 3 months

## 2010-12-26 NOTE — Telephone Encounter (Signed)
Atcx1, NA and no voicemail. WCB. Carron Curie, CMA

## 2010-12-29 NOTE — Telephone Encounter (Signed)
Spoke with pt and advised of options. She states she will think it over and let us know what she decides. Carron Curie, CMA

## 2010-12-29 NOTE — Telephone Encounter (Signed)
PATIENT RETURNED CALL.  PLEASE CALL BACK AT [phone]

## 2010-12-31 ENCOUNTER — Other Ambulatory Visit: Payer: BC Managed Care – PPO | Admitting: *Deleted

## 2010-12-31 ENCOUNTER — Ambulatory Visit: Payer: BC Managed Care – PPO | Admitting: Cardiology

## 2011-01-05 ENCOUNTER — Telehealth: Payer: Self-pay | Admitting: *Deleted

## 2011-01-05 NOTE — Telephone Encounter (Signed)
Dr Shirlee Latch reviewed monitor done 12/17/10. Frequent PACs/blocked PACs may cause palpitations. Rare PVCs. No atrial fib. Monitor report signed by Dr Shirlee Latch forwarded to Windell Moulding.

## 2011-01-06 ENCOUNTER — Encounter: Payer: Self-pay | Admitting: Internal Medicine

## 2011-01-06 NOTE — Assessment & Plan Note (Addendum)
Pet 6/22: Shows no mediastinal uptake. LLL 1.3cm nodule has sUV 1.5 and 1.4cm LLL nodule has sUV 1.5. There is incidental rt thyroid update s suv 1.9 and rt liver lesion (but no compatible ct lesion) with suv 3.7. Explained to patient that odds of lung cancer are intermediate prob (on low side) but slow growing BAC type lung cancer can present like this and would require monitoring for 3-5 years via CT if she cose monitoring. Alternativeis  to biopsy. We discussed ENB and CT guided TTNA methods (risks and limitatations and benefits explained). Explained will need IR input if they think lesions are amenable to CT guided bux. She is currently feeling a bit overwhelmed with all health issues. She would prefer CT guided approach. If ENB approach, wants to think it over. I have told her I would get this discussed with IR and at [facility] and get back to her.  POST CLINIC: DISCUSSED WITH DR Irish Lack OF IR: HE CAN ONLY GET TO 1 LESION BY CT GUIDED TTNA. DISCUSSED WITH DR Edwyna Shell VIA [facility] COORDINATIOR; ENB POSSIBLE. OPTIONS GIVEN OVER PHONE TO PATIENT OF WAIT/MONITOR VERSUS ENB APPROACH. SHE WILL THINK IT OVER  15 min face to face counseling,> 50% in cousneling

## 2011-01-12 ENCOUNTER — Telehealth: Payer: Self-pay | Admitting: Internal Medicine

## 2011-01-12 NOTE — Telephone Encounter (Signed)
Spoke with the [name] and she states she has a few questions about the ENB and wants to discuss this with MR before she decides if she wants to go ahead with it. I advised I will send message to MR. Carron Curie, CMA

## 2011-01-12 NOTE — Telephone Encounter (Signed)
[name]. [name]

## 2011-01-12 NOTE — Telephone Encounter (Signed)
Lmtcbx1.Whitley Strycharz, CMA  

## 2011-01-15 NOTE — Telephone Encounter (Signed)
Pt calling again says she won't be home until late morning she can be reached at [phone].[name]

## 2011-01-16 NOTE — Telephone Encounter (Signed)
Spoke to patient. SHe is not sure abut ENB due to anestehsia (has had multiple and might be due for  More). SHe wants to rediscuss options (CT guided TTNA Reunion said he can only do one). So, wants to make 20 min face to face appt. Jen, pls give appt

## 2011-01-16 NOTE — Telephone Encounter (Signed)
PATIENT'S HUSBAND WANTS TO TALK TO DR RAMASWAMY BEFORE BIOPSY OF PATIENT IS SCHEDULED.  PLEASE HAVE JENNIFER OR DR Marchelle Gearing CALL BACK AT [phone]

## 2011-01-19 ENCOUNTER — Telehealth: Payer: Self-pay | Admitting: Internal Medicine

## 2011-01-19 NOTE — Telephone Encounter (Signed)
[id number] 

## 2011-01-19 NOTE — Telephone Encounter (Signed)
Pt returned call > appt scheduled with MR 8.1.12 to discuss ENB per MR.  Pt okay with this date and time.

## 2011-01-19 NOTE — Telephone Encounter (Signed)
Pt called back.  Is scheduled to see MR on 01/21/2011 at 11:30a m

## 2011-01-20 ENCOUNTER — Telehealth: Payer: Self-pay | Admitting: *Deleted

## 2011-01-20 NOTE — Telephone Encounter (Signed)
PT RETURNED CALL FROM JENNIFER. SHE SAYS SHE WILL BE ABLE TO ARRIVE AT 11AM TOMORROW. IF YOU NEED ANYTHING FURTHER, PT IS NOT AT HOME BUT SAYS NURSE MAY CALL HOME PH# AND SPEAK TO PT'S HUSBAND DICK. Hazel Sams

## 2011-01-20 NOTE — Telephone Encounter (Signed)
I LMTCBx1 with the pt to see if she can come in at 11am tomorrow because MR had requested the pt appt be 20-30 minutes so he would have enough time to answer all her questions.  I have held the slot on tomorrows schedule. Carron Curie, CMA

## 2011-01-21 ENCOUNTER — Encounter: Payer: Self-pay | Admitting: Internal Medicine

## 2011-01-21 ENCOUNTER — Ambulatory Visit (INDEPENDENT_AMBULATORY_CARE_PROVIDER_SITE_OTHER): Payer: BC Managed Care – PPO | Admitting: Internal Medicine

## 2011-01-21 DIAGNOSIS — R911 Solitary pulmonary nodule: Secondary | ICD-10-CM

## 2011-01-21 DIAGNOSIS — J984 Other disorders of lung: Secondary | ICD-10-CM

## 2011-01-21 NOTE — Progress Notes (Signed)
  Subjective:    Patient ID: ZAIAH CREDEUR, female    DOB: August 07, 1945, 65 y.o.   MRN: [id number]  HPI [place] LLL nodules. Here for discussion on approach   Review of Systems  Constitutional: Negative for fever and unexpected weight change.  HENT: Negative for ear pain, nosebleeds, congestion, sore throat, rhinorrhea, sneezing, trouble swallowing, dental problem, postnasal drip and sinus pressure.   Eyes: Negative for redness and itching.  Respiratory: Negative for cough, chest tightness, shortness of breath and wheezing.   Cardiovascular: Negative for palpitations and leg swelling.  Gastrointestinal: Negative for nausea and vomiting.  Genitourinary: Negative for dysuria.  Musculoskeletal: Negative for joint swelling.  Skin: Negative for rash.  Neurological: Negative for headaches.  Hematological: Does not bruise/bleed easily.  Psychiatric/Behavioral: Negative for dysphoric mood. The patient is not nervous/anxious.        Objective:   Physical Exam Not done in detail Counseling only visit       Assessment & Plan:

## 2011-01-21 NOTE — Assessment & Plan Note (Addendum)
LLL x 2 npdules  - persisent over 6 weeks. Female in her 35s. PET near cold. DDx is atypical infections, bronchoalveolar carcinoma low grade. Overall low intermediate prob for malignancy  Spent > 25 minutes face to face conversation, > 50% of this is counseling time Explained differential diagnosis and probability for malignancy Explained approaches to care - biopsy CT guided (only 1 nodule popssible per Dr. Fredia Sorrow), ENB method, serial followup Explained limitation of biopsy - implications of non diagnostic result but would lower malignancy probability but would still need serial followup Explained Rx options  - surgery v XRT if lung cancer and risks  Based on all of above, she prefers and husband concurs that she would prefer to know dx and lower prob of malignancy in case results are non diagnostic. Anxiety of not knowing is too much for her. She prefers biopsy via ENB in October. HOwever, wants this coordinated with Dr. Christia Reading of ENT so she can have ENB and thyroidectomy at same day  This can happen only in Dr Edwyna Shell ORs per Dr. Delton Coombes the ENB operator  I have placed a call into Dr. Jenne Pane and am waiting to hear

## 2011-01-22 ENCOUNTER — Telehealth: Payer: Self-pay | Admitting: Internal Medicine

## 2011-01-22 NOTE — Telephone Encounter (Signed)
Jen  Pls tell her I spoke to Dr Jenne Pane yesterday. He thinks coordinating OR for bronch-ebus and his thyroid surgery are definitely possible. Dr Delton Coombes also seemed to think so. Next step is she should see Dr Jenne Pane (he said he will call her and make appt)  And he will bein touch with me to coordinate. We can do this in October per her wishes  MR

## 2011-01-26 NOTE — Telephone Encounter (Signed)
Pt aware. She has appt with Dr. Jenne Pane today. Carron Curie, CMA

## 2011-02-02 ENCOUNTER — Telehealth: Payer: Self-pay | Admitting: Internal Medicine

## 2011-02-02 DIAGNOSIS — R911 Solitary pulmonary nodule: Secondary | ICD-10-CM

## 2011-02-02 NOTE — Telephone Encounter (Signed)
Called and verified with pt's spouse, Viana Sleep, preferred date of Ebus, order sent to [facility].

## 2011-02-04 ENCOUNTER — Telehealth: Payer: Self-pay | Admitting: Internal Medicine

## 2011-02-04 NOTE — Telephone Encounter (Signed)
ATC office but it is closed. EBUS is already scheduled for 04-01-11 so thyroidectomy should be set for the same day is the plan. Call back in AM. Carron Curie, CMA

## 2011-02-05 NOTE — Telephone Encounter (Signed)
[name] is aware EBUS is scheduled for 04/01/2011 @ 8:30 am and usually takes 85 min for the procedure. Elizebeth Brooking will tack on the thyroidectomy needed after EBUS is done.

## 2011-02-10 NOTE — Telephone Encounter (Signed)
Called apatient to discus ENB dates. Basically ENT Dr Jenne Pane thinks he can do his thyroidectomy same time as EBUS. She wants it in October. The possible dates are 04/01/11, 04/08/11 and 10/3 Wednesday. Need to know if she is flexible on all those dates. AFter that, I need to coordinate with Dr Delton Coombes, Dr Edwyna Shell OR availability, OR logistics and DR. Jenne Pane  I tried all 3 numbers; 2 are disconnected and in one I left message to call back. Please call patient and let me know

## 2011-02-12 NOTE — Telephone Encounter (Signed)
[email or web address].Jennifer Castillo, CMA  

## 2011-02-13 ENCOUNTER — Telehealth: Payer: Self-pay | Admitting: Internal Medicine

## 2011-02-13 DIAGNOSIS — R911 Solitary pulmonary nodule: Secondary | ICD-10-CM

## 2011-02-13 NOTE — Telephone Encounter (Signed)
Spoke with pt and advised nothing further needed from me that MR told her everything. I advised she will be contacted with appt for CT. Carron Curie, CMA

## 2011-02-13 NOTE — Telephone Encounter (Signed)
Spoke to Ms Bigler. She called. Stated that ENB (not Ebus as alluded to in earlier notes) is scheduled for 04/01/11 in conjunction with Dr Delton Coombes (ENB) and Dr. Jenne Pane (thyroidectomy). Dr Jenne Pane reportely aware of date and time.   Candise Bowens therefore please  A)  set up non contrast super D ct chest after 03/23/2011 but before 03/28/2011 leaving disc with me so I can give it to byrym  B) for 04/01/11 I had asked Budd Palmer for AM office. I would like to start office later after procedure but I will coordinate this with Budd Palmer. So far he has not opened that slot for me. So, I will coordinate with him  Thanks  MR  Cc to Dr Delton Coombes

## 2011-02-13 NOTE — Telephone Encounter (Signed)
PT RETURNED CALL. CALL [phone]. [name]

## 2011-02-13 NOTE — Telephone Encounter (Signed)
Pt aware she has ct chest 03/25/11@10 :00@[facility] 

## 2011-02-17 ENCOUNTER — Telehealth: Payer: Self-pay | Admitting: *Deleted

## 2011-02-17 NOTE — Telephone Encounter (Signed)
Pt is flying to [place], and needs RX for anxiety. [facility] [address] )

## 2011-02-17 NOTE — Telephone Encounter (Signed)
She has taken alprazolam 0.5 mg in past one po q 8 hours prn anxiety. Disp #30 with no refill.

## 2011-02-18 MED ORDER — ALPRAZOLAM 0.5 MG PO TABS: 0.5000 mg | ORAL_TABLET | Freq: Three times a day (TID) | ORAL | Status: AC | PRN

## 2011-03-03 ENCOUNTER — Telehealth: Payer: Self-pay | Admitting: Cardiology

## 2011-03-03 MED ORDER — PROPRANOLOL HCL 10 MG PO TABS: 10.0000 mg | ORAL_TABLET | Freq: Three times a day (TID) | ORAL | Status: DC

## 2011-03-03 NOTE — Telephone Encounter (Signed)
propanalol refill at [facility]

## 2011-03-03 NOTE — Telephone Encounter (Signed)
REFILL  DONE  VIA  EPIC ./CY 

## 2011-03-24 ENCOUNTER — Telehealth: Payer: Self-pay | Admitting: Internal Medicine

## 2011-03-24 NOTE — Telephone Encounter (Signed)
Spoke with pt. She states needs excuse from jury duty. She states has to many procedures coming up and her spouse is also having surgery, and so would like MR to do letter excusing her. I advised if he says it's fine, she will need to bring her summons here, so will ask MR if this is okay and then let her know. Please advise, thanks!

## 2011-03-25 ENCOUNTER — Other Ambulatory Visit: Payer: Self-pay | Admitting: Family Medicine

## 2011-03-25 ENCOUNTER — Emergency Department (HOSPITAL_COMMUNITY): Payer: BC Managed Care – PPO

## 2011-03-25 ENCOUNTER — Inpatient Hospital Stay (HOSPITAL_COMMUNITY)
Admission: EM | Admit: 2011-03-25 | Discharge: 2011-03-26 | DRG: 125 | Disposition: A | Discharge status: Home / Self Care | Payer: BC Managed Care – PPO | Attending: Cardiology | Admitting: Cardiology

## 2011-03-25 ENCOUNTER — Ambulatory Visit (INDEPENDENT_AMBULATORY_CARE_PROVIDER_SITE_OTHER)
Admission: RE | Admit: 2011-03-25 | Discharge: 2011-03-25 | Disposition: A | Discharge status: Home / Self Care | Payer: BC Managed Care – PPO | Source: Ambulatory Visit | Attending: Internal Medicine | Admitting: Internal Medicine

## 2011-03-25 DIAGNOSIS — Z7982 Long term (current) use of aspirin: Secondary | ICD-10-CM

## 2011-03-25 DIAGNOSIS — Z9089 Acquired absence of other organs: Secondary | ICD-10-CM

## 2011-03-25 DIAGNOSIS — J984 Other disorders of lung: Secondary | ICD-10-CM

## 2011-03-25 DIAGNOSIS — R079 Chest pain, unspecified: Secondary | ICD-10-CM

## 2011-03-25 DIAGNOSIS — R0602 Shortness of breath: Secondary | ICD-10-CM

## 2011-03-25 DIAGNOSIS — R0789 Other chest pain: Principal | ICD-10-CM | POA: Diagnosis present

## 2011-03-25 DIAGNOSIS — I2510 Atherosclerotic heart disease of native coronary artery without angina pectoris: Secondary | ICD-10-CM | POA: Diagnosis present

## 2011-03-25 DIAGNOSIS — F411 Generalized anxiety disorder: Secondary | ICD-10-CM | POA: Diagnosis present

## 2011-03-25 DIAGNOSIS — E079 Disorder of thyroid, unspecified: Secondary | ICD-10-CM | POA: Diagnosis present

## 2011-03-25 DIAGNOSIS — R911 Solitary pulmonary nodule: Secondary | ICD-10-CM | POA: Diagnosis present

## 2011-03-25 DIAGNOSIS — Z9079 Acquired absence of other genital organ(s): Secondary | ICD-10-CM

## 2011-03-25 DIAGNOSIS — Z90710 Acquired absence of both cervix and uterus: Secondary | ICD-10-CM

## 2011-03-25 DIAGNOSIS — I059 Rheumatic mitral valve disease, unspecified: Secondary | ICD-10-CM | POA: Diagnosis present

## 2011-03-26 ENCOUNTER — Other Ambulatory Visit (HOSPITAL_COMMUNITY): Payer: BC Managed Care – PPO

## 2011-03-26 ENCOUNTER — Encounter: Payer: Self-pay | Admitting: Cardiology

## 2011-03-26 DIAGNOSIS — I059 Rheumatic mitral valve disease, unspecified: Secondary | ICD-10-CM

## 2011-03-26 DIAGNOSIS — R0602 Shortness of breath: Secondary | ICD-10-CM

## 2011-03-26 LAB — COMPREHENSIVE METABOLIC PANEL
ALT: 15 U/L (ref 0–35)
AST: 20 U/L (ref 0–37)
Alkaline Phosphatase: 52 U/L (ref 39–117)
CO2: 21 mEq/L (ref 19–32)
Chloride: 113 mEq/L — ABNORMAL HIGH (ref 96–112)
GFR calc Af Amer: >90 mL/min (ref >90)
GFR calc non Af Amer: 87 mL/min — ABNORMAL LOW (ref >90)
Glucose, Bld: 91 mg/dL (ref 70–99)
Potassium: 3.3 mEq/L — ABNORMAL LOW (ref 3.5–5.1)
Sodium: 147 mEq/L — ABNORMAL HIGH (ref 135–145)

## 2011-03-26 LAB — CBC
HCT: 40.2 % (ref 36.0–46.0)
MCH: 33.9 pg (ref 26.0–34.0)
MCHC: 36.1 g/dL — ABNORMAL HIGH (ref 30.0–36.0)
MCV: 93.9 fL (ref 78.0–100.0)
RDW: 12.6 % (ref 11.5–15.5)

## 2011-03-26 LAB — POCT I-STAT, CHEM 8
BUN: 14 mg/dL (ref 6–23)
Creatinine, Ser: 1.1 mg/dL (ref 0.50–1.10)
Glucose, Bld: 90 mg/dL (ref 70–99)
Hemoglobin: 15 g/dL (ref 12.0–15.0)
Sodium: 147 mEq/L — ABNORMAL HIGH (ref 135–145)
TCO2: 17 mmol/L (ref 0–100)

## 2011-03-26 LAB — POCT ACTIVATED CLOTTING TIME: Activated Clotting Time: 111 seconds

## 2011-03-26 LAB — DIFFERENTIAL
Basophils Absolute: 0.1 10*3/uL (ref 0.0–0.1)
Eosinophils Relative: 4 % (ref 0–5)
Lymphocytes Relative: 53 % — ABNORMAL HIGH (ref 12–46)
Monocytes Absolute: 0.5 10*3/uL (ref 0.1–1.0)
Monocytes Relative: 11 % (ref 3–12)

## 2011-03-26 LAB — HEPARIN LEVEL (UNFRACTIONATED): Heparin Unfractionated: 0.26 IU/mL — ABNORMAL LOW (ref 0.30–0.70)

## 2011-03-26 LAB — CK TOTAL AND CKMB (NOT AT ARMC)
CK, MB: 4.6 ng/mL — ABNORMAL HIGH (ref 0.3–4.0)
Relative Index: 1.8 (ref 0.0–2.5)
Total CK: 252 U/L — ABNORMAL HIGH (ref 7–177)

## 2011-03-26 LAB — APTT: aPTT: 28 seconds (ref 24–37)

## 2011-03-26 LAB — CARDIAC PANEL(CRET KIN+CKTOT+MB+TROPI)
CK, MB: 4.1 ng/mL — ABNORMAL HIGH (ref 0.3–4.0)
Relative Index: 1.9 (ref 0.0–2.5)
Total CK: 214 U/L — ABNORMAL HIGH (ref 7–177)
Troponin I: <0.3 ng/mL (ref <0.30)
Troponin I: <0.3 ng/mL (ref <0.30)

## 2011-03-26 LAB — POCT I-STAT TROPONIN I
Troponin i, poc: 0 ng/mL (ref 0.00–0.08)
Troponin i, poc: 0 ng/mL (ref 0.00–0.08)

## 2011-03-26 LAB — PROTIME-INR: INR: 0.98 (ref 0.00–1.49)

## 2011-03-26 LAB — TROPONIN I: Troponin I: <0.3 ng/mL (ref <0.30)

## 2011-03-26 LAB — LIPID PANEL: LDL Cholesterol: 78 mg/dL (ref 0–99)

## 2011-03-26 NOTE — Telephone Encounter (Signed)
Refill OK

## 2011-03-26 NOTE — Telephone Encounter (Signed)
Last filled 07-11-2010, #30 with 0 refills. She has no future appt scheduled, last OV 11/2009

## 2011-03-27 ENCOUNTER — Encounter (INDEPENDENT_AMBULATORY_CARE_PROVIDER_SITE_OTHER): Payer: BC Managed Care – PPO

## 2011-03-27 DIAGNOSIS — R0609 Other forms of dyspnea: Secondary | ICD-10-CM

## 2011-03-27 DIAGNOSIS — R0989 Other specified symptoms and signs involving the circulatory and respiratory systems: Secondary | ICD-10-CM

## 2011-03-30 ENCOUNTER — Telehealth: Payer: Self-pay | Admitting: Cardiology

## 2011-03-30 NOTE — Telephone Encounter (Signed)
Yeah, I can do it. No problem

## 2011-03-30 NOTE — Telephone Encounter (Signed)
Walk In Pt From " Pt Needs Letter to be excused from Jury Duty" sent to [employer]  03/30/11/km

## 2011-03-30 NOTE — Telephone Encounter (Signed)
Pt is aware and will bring her jury summons up here for MR to do the letter. Nothing further was needed

## 2011-03-31 ENCOUNTER — Telehealth: Payer: Self-pay | Admitting: Internal Medicine

## 2011-03-31 NOTE — Telephone Encounter (Signed)
The jury summons was given to [facility] for MR to do letter. Will forward to [facility], please advise thanks  Carver Fila, CMA

## 2011-04-01 ENCOUNTER — Other Ambulatory Visit: Payer: Self-pay | Admitting: Emergency Medicine

## 2011-04-01 ENCOUNTER — Other Ambulatory Visit: Payer: Self-pay | Admitting: Otolaryngology

## 2011-04-01 ENCOUNTER — Ambulatory Visit (HOSPITAL_COMMUNITY): Admission: RE | Admit: 2011-04-01 | Payer: Self-pay | Source: Ambulatory Visit | Admitting: Internal Medicine

## 2011-04-01 ENCOUNTER — Ambulatory Visit (HOSPITAL_COMMUNITY)
Admission: RE | Admit: 2011-04-01 | Discharge: 2011-04-02 | Disposition: A | Discharge status: Home / Self Care | Payer: BC Managed Care – PPO | Source: Ambulatory Visit | Attending: Internal Medicine | Admitting: Internal Medicine

## 2011-04-01 ENCOUNTER — Encounter: Payer: Self-pay | Admitting: *Deleted

## 2011-04-01 ENCOUNTER — Ambulatory Visit (HOSPITAL_COMMUNITY): Payer: BC Managed Care – PPO

## 2011-04-01 ENCOUNTER — Telehealth: Payer: Self-pay | Admitting: *Deleted

## 2011-04-01 DIAGNOSIS — J449 Chronic obstructive pulmonary disease, unspecified: Secondary | ICD-10-CM | POA: Insufficient documentation

## 2011-04-01 DIAGNOSIS — J4489 Other specified chronic obstructive pulmonary disease: Secondary | ICD-10-CM | POA: Insufficient documentation

## 2011-04-01 DIAGNOSIS — I10 Essential (primary) hypertension: Secondary | ICD-10-CM | POA: Insufficient documentation

## 2011-04-01 DIAGNOSIS — D34 Benign neoplasm of thyroid gland: Secondary | ICD-10-CM | POA: Insufficient documentation

## 2011-04-01 LAB — CBC
HCT: 37.7 % (ref 36.0–46.0)
Hemoglobin: 13.1 g/dL (ref 12.0–15.0)
MCV: 96.4 fL (ref 78.0–100.0)
WBC: 4.3 10*3/uL (ref 4.0–10.5)

## 2011-04-01 LAB — SURGICAL PCR SCREEN
MRSA, PCR: NEGATIVE
Staphylococcus aureus: NEGATIVE

## 2011-04-01 LAB — BASIC METABOLIC PANEL
CO2: 24 mEq/L (ref 19–32)
Calcium: 9.8 mg/dL (ref 8.4–10.5)
Creatinine, Ser: 0.79 mg/dL (ref 0.50–1.10)
GFR calc Af Amer: >90 mL/min (ref >90)
Sodium: 140 mEq/L (ref 135–145)

## 2011-04-01 LAB — APTT: aPTT: 28 seconds (ref 24–37)

## 2011-04-01 NOTE — Telephone Encounter (Signed)
Dr Shirlee Latch has done a letter to excuse pt from jury duty 04/27/11. I have left the letter at the front desk for pt to pick up. LM for pt to call back to let her know the letter is at the front desk for her to pick up.

## 2011-04-01 NOTE — Telephone Encounter (Signed)
Jury Summons letter typed and placed in MR look-at to sign. I will have him sign tomorrow. Carron Curie, CMA

## 2011-04-02 NOTE — Telephone Encounter (Signed)
Pt returning your call

## 2011-04-02 NOTE — Telephone Encounter (Signed)
Left message stating paperwork requested will be signed by MR and mailed out today. This is per JC. Julaine Hua, CMA

## 2011-04-03 ENCOUNTER — Telehealth: Payer: Self-pay | Admitting: Internal Medicine

## 2011-04-03 ENCOUNTER — Telehealth: Payer: Self-pay | Admitting: Cardiology

## 2011-04-03 NOTE — Telephone Encounter (Signed)
Pt's spouse aware of MW recs and says he will wait til the first of the week to hear from the bx results. Pt will seek emergency help if her breathing worsens.

## 2011-04-03 NOTE — Telephone Encounter (Signed)
ENB performed on Wed., 10/10 and pt says her throat is sore and voice is "raspy" sounding. She wants to make sure this is normal. She denies any sob, chest pain or f/c/s.  Dr. Sherene Sires pls advise. Dr. Marchelle Gearing and Dr. Delton Coombes are both out of the office.

## 2011-04-03 NOTE — Telephone Encounter (Signed)
Pt was called by Thurston Hole but no message left/several days ago/  He is returning her call.  Aware that Thurston Hole is off today.

## 2011-04-03 NOTE — Telephone Encounter (Signed)
I spoke with pt husband, wife was just released from the hospital.   He will try to pick up jury duty letter today. Mylo Red RN

## 2011-04-03 NOTE — Telephone Encounter (Signed)
Raspy throat typical p bronch,  should gradually improve, no need for concern unless worse breathing. Can use cepacol lozenges otc prn

## 2011-04-04 LAB — CULTURE, RESPIRATORY W GRAM STAIN

## 2011-04-05 NOTE — Discharge Summary (Addendum)
NAMESHARLA, Liu NO.:  [id number]  MEDICAL RECORD NO.:  [id number]  LOCATION:  2917                         FACILITY:  [facility]  PHYSICIAN:  Marca Ancona, MD      DATE OF BIRTH:  1945-12-09  DATE OF ADMISSION:  03/25/2011 DATE OF DISCHARGE:  03/26/2011                              DISCHARGE SUMMARY   DISCHARGE DIAGNOSES: 1. Chest pain, ruled out for myocardial infarction.     a.     Mild nonobstructive coronary artery disease by      catheterization on March 26, 2011. 2. 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 3. Left lower lobe pulmonary nodules scheduled for bronchoscopy and     biopsy next week. 4. Right thyroid specific mass scheduled for resection next week. 5. Anxiety. 6. Hysterectomy with bilateral salpingo-oophorectomy secondary to     endometriosis. 7. Appendectomy. 8. Tonsillectomy.  HOSPITAL COURSE:  [name] is a 65 year old female with a history of mitral valve prolapse followed clinically, who presented to [facility] with complaints of chest pain with associated shortness of breath and diaphoresis.  Her symptoms were felt concerning enough to warrant hospitalization.  She had a negative stress test in May, but did have some EKG changes compared to then.  She was started on aspirin 325 mg daily and heparin drip.  She was kept n.p.o. in anticipation for cardiac catheterization today which demonstrated mild nonobstructive CAD.  Medical management was recommended and I discussed this with Dr. Shirlee Latch who would like to keep her on her beta-blocker and add an aspirin for now.  The patient also had a mildly elevated D-dimer that was ordered on admission 0.53.  Dr. Shirlee Latch would like to have the patient ambulate with O2 sats recorded.  There is low suspicion for PE at this time, but if she should develop further shortness of breath or have any defect in her  O2 sats, we will proceed with V/Q scan.  Otherwise, Dr. Shirlee Latch has seen and examined the patient today and feels she is stable for discharge.  DISCHARGE LABORATORY DATA:  Sodium 147, potassium 2.3, chloride 113, CO2 of 21, glucose 91, BUN 15, creatinine 0.79.  LFTs were normal.  Cardiac enzymes show elevated CK up to 252 and MB of 4.6, but negative troponins, total cholesterol 192, triglycerides 170, HDL 79, LDL 78.  STUDIES: 1. Chest x-ray on March 25, 2011, showed no evidence of active     pulmonary disease.  Vague nodular opacity seen on the lateral view,     likely corresponding to lesions described on previous CT scans 2. A 2-D echocardiogram on March 26, 2011, demonstrated EF of 60%-65%     with no regional wall motion abnormalities.  LV diastolic functions     were normal.  Severe mitral valve prolapse with involvement of the     inferior and posterior leaflet.  Moderate regurgitation.  DISCHARGE MEDICATIONS: 1. Aspirin 81 mg a day. 2. Ambien 10 mg at bedtime. 3. Losartan 60 mg daily. 4. Omega Vision 1 tablet daily. 5. Propranolol 10 mg t.i.d.  DISPOSITION:  Pending ambulation and check-up of her O2 sats, the patient will be discharged in stable condition to home.  She is to keep her cath site clean and dry and call or return for pain, swelling, bleeding, or pus.  She can increase activity slowly and is not to lift anything over 5 pounds for 1 week.  She is to follow a low-sodium, heart- healthy diet.  Dr. Shirlee Latch would like her to have a heart monitor to rule out atrial fibrillation as an outpatient for 3 weeks, then our office will call her to arrange this.  She will also follow up with Dr. Shirlee Latch on April 22, 2011, at 11:15 a.m.  DURATION OF DISCHARGE ENCOUNTER:  Greater than 30 minutes including physician and PA time.     Ronie Spies, P.A.C.   ______________________________ Marca Ancona, MD    DD/MEDQ  D:  03/26/2011  T:  03/26/2011  Job:   [id number]  Electronically Signed by Ronie Spies  on 04/05/2011 03:09:37 PM Electronically Signed by Marca Ancona MD on 04/15/2011 07:51:06 PM

## 2011-04-06 ENCOUNTER — Telehealth: Payer: Self-pay | Admitting: Internal Medicine

## 2011-04-06 DIAGNOSIS — R911 Solitary pulmonary nodule: Secondary | ICD-10-CM

## 2011-04-06 NOTE — Telephone Encounter (Signed)
PT'S SPOUSE CALLED BACK. SAYS PT IS GOING TO SEE DR. BATES (ENT) TODAY. [name]

## 2011-04-06 NOTE — Telephone Encounter (Signed)
I informed pt that her [name] Summons letter has been mailed to courthouse. Advised no result at this time. Pt c/o lip and cheek swelling, itching in mouth inside of cheeks and lips, PND, dry cough, sore throat, body aches x  3 days. Denies tongue swelling or problems with breathing. Feels "flu-ish". She is not taking any new medications. I informed pt MR is out of the office this PM, and will forward to "doc of the day", Dr. Sherene Sires please advise. Pt has been informed to call 911 if she is unable to breathe due to [place] not having any openings in the office.

## 2011-04-07 ENCOUNTER — Telehealth: Payer: Self-pay | Admitting: Internal Medicine

## 2011-04-07 ENCOUNTER — Telehealth: Payer: Self-pay

## 2011-04-07 NOTE — Telephone Encounter (Signed)
Spoke with pt and she advised Dr. Jenne Pane saw her yesterday and dx it to be "viral" and to call back on Friday. Pt states she has not seen her PCP in some time because she has specialist. I advised pt it is always good to keep her PCP in the know and to call them once she got off the phone with me to try and get a follow up this week and go from there. I also advised pt MR will not be in the office until mid tomorrow morning and I will forward this message to him for biopsy results. Pt verbalized understanding. MR please advise. Thanks.

## 2011-04-07 NOTE — Telephone Encounter (Signed)
Pt called pt is wearing heart monitor. She wants to take it off over the weekend

## 2011-04-07 NOTE — Telephone Encounter (Signed)
Duplicate phone message -- pls see phone message from 04/06/11 for additional information regarding this as it was sent to MR for biopsy results.

## 2011-04-07 NOTE — Telephone Encounter (Signed)
Dup message °

## 2011-04-07 NOTE — Telephone Encounter (Signed)
atc number provided above -- unable to leave message.  wcb -- ? Was pt seen by ENT yesterday for these symptoms?  Is anything further needed regarding this?

## 2011-04-08 ENCOUNTER — Encounter: Payer: Self-pay | Admitting: Family Medicine

## 2011-04-08 ENCOUNTER — Ambulatory Visit (INDEPENDENT_AMBULATORY_CARE_PROVIDER_SITE_OTHER): Payer: BC Managed Care – PPO | Admitting: Family Medicine

## 2011-04-08 VITALS — BP 120/72 | Temp 97.9°F | Wt 132.0 lb

## 2011-04-08 DIAGNOSIS — J019 Acute sinusitis, unspecified: Secondary | ICD-10-CM

## 2011-04-08 MED ORDER — AMOXICILLIN-POT CLAVULANATE 875-125 MG PO TABS: 1.0000 | ORAL_TABLET | Freq: Two times a day (BID) | ORAL | Status: AC

## 2011-04-08 MED ORDER — FLUCONAZOLE 150 MG PO TABS: 150.0000 mg | ORAL_TABLET | Freq: Once | ORAL | Status: AC

## 2011-04-08 NOTE — H&P (Signed)
NAMEDENISA, ENTERLINE NO.:  [id number]  MEDICAL RECORD NO.:  [id number]  LOCATION:  [facility]                         FACILITY:  [facility]  PHYSICIAN:  Harlon Flor, MD   DATE OF BIRTH:  [birth year]  DATE OF ADMISSION:  03/25/2011 DATE OF DISCHARGE:                             HISTORY & PHYSICAL   CARDIOLOGIST:  Marca Ancona, MD.  The patient also sees Dr. Marchelle Gearing with Pulmonary and Dr. Jenne Pane.  PRIMARY CARE PHYSICIAN:  Evelena Peat, MD  CHIEF COMPLAINT:  Chest pain and shortness of breath.  HISTORY OF PRESENT ILLNESS:  Ms. 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  She states while she was planting flowers tonight, she developed chest pain and shortness of breath.  When she does get short of breath, she gets very worried and she states this worsens her chest pain.  This lasted until she came to the ER and received nitroglycerin.  She also had diaphoresis.  The pain did not radiate and did not appear to be worsened with exertion.  Her pain was somewhat similar to when she presented with in May 2012.  She was seen in the emergency room and had an EKG that showed inferolateral T-wave inversions that were different from her previous EKG.  She also has been worked up for lung nodule as well as a thyroid nodule and has plans for a partial thyroidectomy followed by bronchoscopy for biopsy next week. She was seen by Dr. Shirlee Latch in [facility], who performed a transesophageal echocardiogram in June 2012 showing moderate to severe mitral regurgitation, but she has been asymptomatic and this is being followed clinically for now.  PAST MEDICAL HISTORY: 1. 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 2. Hysterectomy with bilateral  salpingo-oophorectomy secondary to     endometriosis. 3. Appendectomy. 4. Tonsillectomy. 5. Left lower lobe pulmonary nodules scheduled for bronchoscopy and     biopsy next week. 6. 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 7. Anxiety.  ALLERGIES:  No known drug allergies.  HOME MEDICATIONS: 1. Ambien 10 mg at bedtime. 2. Losartan 50 mg daily. 3. Propranolol 10 mg t.i.d.  FAMILY HISTORY:  There is no history of early coronary artery disease.  SOCIAL HISTORY:  The patient drinks 1 or 2 glasses of wine per night. She does not use tobacco, quit smoking approximately 30 years ago.  She is married and she and her husband recently moved back to [place] from [place] 3 years ago.  REVIEW OF SYSTEMS:  Full review of systems is negative except as described in HPI.  PHYSICAL EXAMINATION:  VITAL SIGNS:  Blood pressure 133/76, pulse 75, respirations 16, temperature 97.6. GENERAL:  No acute distress. HEENT:  Extraocular movements intact.  Oropharynx benign.  Nonicteric sclerae. NECK:  Supple. CARDIOVASCULAR:  Regular rate and rhythm with a 3/6 mid systolic murmur heard best at the apex.  No jugular venous distention. LUNGS:  Clear to auscultation bilaterally. ABDOMEN:  Soft,  nontender, nondistended. EXTREMITIES:  No clubbing, cyanosis, or edema.  Pulses are intact throughout. NEUROLOGIC:  She moves all extremities well.  Alert and oriented x3. She is grossly afocal. SKIN:  No rashes. LYMPH:  No lymphadenopathy.  White count 4.4, hemoglobin 14, platelets 292.  Point-of-care troponin is negative.  Her basic metabolic panel is pending.  Her chest x-ray is clear other than previously described nodular opacities in the left lower lobe.  Her EKG shows normal sinus rhythm with possible LVH and inferolateral T-wave inversions.  ASSESSMENT AND PLAN:  Ms. 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  Today, she presents with chest pain and some EKG changes that are concerning for acute coronary syndrome. 1. Chest pain:  She reports a negative stress test in May, but now she     has some EKG changes compared to then.  We will place her on     aspirin 325 daily and heparin drip.  We will follow her cardiac     enzymes.  She has one set negative x1.  Depending on how she does,     we might consider left heart cath given her ongoing workup for     thyroid cyst and lung nodules.  We need to consider bare metal     stent if intervention is needed. 2. Mitral valve disease with regurgitation:  She currently does not     have any symptomatic heart failure.  She is     followed by Dr. Shirlee Latch.  She will need valve repair/replacement at     some point in the future. 3. Thyroid and lung nodule:  She is scheduled for a partial     thyroidectomy as well as bronchoscopy with biopsy next week in her     ongoing workup for this nodule.     Harlon Flor, MD     MMB/MEDQ  D:  03/26/2011  T:  03/26/2011  Job:  [id number]  cc:   Marca Ancona, MD Evelena Peat, M.D. Kalman Shan, MD Dr. Jenne Pane  Electronically Signed by Meridee Score MD on 04/08/2011 11:49:02 PM

## 2011-04-08 NOTE — Progress Notes (Signed)
  Subjective:    Patient ID: [name], female    DOB: Oct 19, 1945, 65 y.o.   MRN: [id number]  HPI Acute visit. Sinus congestion for one week. Some cough. Intermittent sore throat. Upper teeth pain. History of frequent sinusitis in the past. Denies sick contacts. No headache. Denies nausea, vomiting, or diarrhea.  Recent chest pain. Had extensive cardiac workup with catheterization. No significant CAD. Mitral valve prolapse. Taking medications including losartan and propranolol. No recent chest pain. Holter monitor in place to evaluate palpitations.   Review of Systems  Constitutional: Negative for fever and chills.  HENT: Positive for congestion, sore throat and sinus pressure.   Respiratory: Positive for cough. Negative for shortness of breath and wheezing.   Cardiovascular: Negative for chest pain and leg swelling.  Neurological: Negative for headaches.       Objective:   Physical Exam  Constitutional: She appears well-developed and well-nourished.  HENT:  Right Ear: External ear normal.  Left Ear: External ear normal.  Mouth/Throat: Oropharynx is clear and moist.  Neck: Neck supple.  Cardiovascular: Normal rate and regular rhythm.   Pulmonary/Chest: Effort normal and breath sounds normal. No respiratory distress. She has no wheezes. She has no rales.  Musculoskeletal: She exhibits no edema.  Lymphadenopathy:    She has no cervical adenopathy.          Assessment & Plan:  Acute sinusitis. Augmentin 875 mg twice daily for 10 days.   patient will return for flu vaccine

## 2011-04-08 NOTE — Telephone Encounter (Signed)
Spoke to patient. ENB of 2nodules in left chest is non-diagnostic. Explained to her that this means we do not know if cancer or not. Given stability for 5 months, and pet negative odds are lower for cancr though not eliminated. Offered CT guided TTNA of subpleural nodule v continued observation. For now she prefers continued observation. AT next fu CT in 6 months she will decide if to have ct guided TTNA of left sub pleural nodule.  Please schedule non contrast CT chst for left lung nodule followup in 6 months. FU with me in 6 month  MR

## 2011-04-09 NOTE — Op Note (Signed)
[name], [name] NO.:  [id number]  MEDICAL RECORD NO.:  [id number]  LOCATION:  5151                         FACILITY:  [facility]  PHYSICIAN:  Antony Contras, MD     DATE OF BIRTH:  24-Dec-1945  DATE OF PROCEDURE:  04/01/2011 DATE OF DISCHARGE:                              OPERATIVE REPORT   PREOPERATIVE DIAGNOSIS:  Right thyroid nodule.  POSTOP DIAGNOSIS:  Right thyroid nodule.  PROCEDURE:  Right thyroid lobectomy.  SURGEON:  Excell Seltzer. Jenne Pane, MD  ASSISTANT:  Aquilla Hacker, [facility]  ANESTHESIA:  General endotracheal anesthesia.  COMPLICATIONS:  None.  INDICATION:  The patient is a 65 year old white female who has been aware of a right thyroid nodule for many months.  Previous evaluations suggested a benign nodule but she was given the options of observation versus excision for definitive biopsy and because she is undergoing a lung biopsy under general anesthesia, wished to proceed with removal of the right thyroid lobe.  Thus, she presents to the operating room for surgical management.  FINDINGS:  The right thyroid lobe had a mid lobe mass.  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  DESCRIPTION OF PROCEDURE:  The patient first underwent the procedure by Dr. Delton Coombes for her long biopsy, so she was artery under anesthesia via endotracheal tube at the beginning of my case.  Once he was finished, a shoulder roll was placed under her shoulders and the neck incision was marked with a marking pen and injected with local anesthesia.  The neck was prepped and draped in sterile fashion.  Incision was made with a #15 blade scalpel through the skin and extended through subcutaneous and platysma layers using Bovie cautery.  Subplatysmal flaps were elevated superiorly and inferiorly and a self-retaining retractor was added.  The midline raphe of the strap muscles was divided and the strap muscles were  retracted over the right thyroid lobe.  The dissection was then performed along the thyroid capsule freeing vascular connections.  The vessels inferiorly were then ligated and divided and this continued around the periphery of the gland toward the superior pole which was carefully dissected free and ligated and divided.  Dissection then continued from the superior pole inferiorly along the lateral border of the gland carefully dissecting tissues and ligating vessels.  The gland was then rolled medially and dissection continued around the deep surface of the gland.  Prior to dissecting completely from her trachea, the isthmus was divided in the midline using Bovie electrocautery and this created the limits of the dissection.  Attention was directed again to the under surface of the gland and it was carefully dissected from the underlying tissues.  The recurrent laryngeal nerve was not encountered during the case but was identified deep in the tissues, deep to the gland after the thyroid was removed.  An inferior parathyroid gland was encountered and kept in place.  The superior parathyroid gland was not seen.  At this point, the gland was sent for frozen section and returned with the diagnosis as above.  The surgical site was copiously irrigated with saline and Valsalva was given and  no additional bleeding was seen.  A small suction drain was placed into the wound and secured at the left side of the incision using 2-0 nylon suture in a standard drain stitch.  The midline raphe was closed with 3-0 Vicryl suture in a simple running nterrupted fashion.  The platysma layer was closed in the same fashion with the same suture.  The subcutaneous layer was closed with 4-0 Vicryl suture and suture in a simple fashion and the skin was closed with Dermabond.  The drain was hooked to suction during closure and the same was then attached to the left shoulder after drapes were removed.  The patient  returned to Anesthesia for wake-up, he was extubated and moved to recovery room in stable condition.     Antony Contras, MD     DDB/MEDQ  D:  04/01/2011  T:  04/01/2011  Job:  [id number]  Electronically Signed by Christia Reading MD on 04/09/2011 01:42:11 PM

## 2011-04-09 NOTE — Cardiovascular Report (Signed)
NAMEQUILLA, FREEZE NO.:  [id number]  MEDICAL RECORD NO.:  [id number]  LOCATION:  2917                         FACILITY:  [facility]  PHYSICIAN:  Vesta Mixer, M.D. DATE OF BIRTH:  06-01-1946  DATE OF PROCEDURE:  03/26/2011 DATE OF DISCHARGE:  03/26/2011                           CARDIAC CATHETERIZATION   [name] is a 65 year old female with a history of mitral valve prolapse and known mitral regurgitation.  She was admitted to the hospital with episodes of some chest pain and severe shortness of breath that occurred after she was out working in the garden.  The episode lasted for several hours and she came to the emergency room.  PROCEDURE:  Right and left heart catheterization.  The right femoral artery and right femoral vein were easily cannulated using a modified Seldinger technique.  HEMODYNAMICS:  The RA pressure is 8/5 with a mean of 4.  The right ventricular pressure is 29/2.  The pulmonary capillary wedge pressure is 10, pulmonary artery pressure is 23/9 with a mean of 15.  A cardiac output by thermodilution is 4.7 liters per minute.  We were not able to do a Fick calculation because we had placed some oxygen on her during heart catheterization.  The left ventricular pressure was 135/12.  The aortic pressure was 133/72.  ANGIOGRAPHY:  Left main.  The left main is large and normal.  Left anterior descending artery is large and normal.  There is a large diagonal branch which is also normal.  The left circumflex system is quite large system.  It is smooth and normal.  It gives off several large posterolateral branches, all of which are normal.  There is a small septal branch that appears to fill a small right coronary branch.  This does not appear to be very important branch, but it does suggest some ischemia in the past and may be the cause of some of her chest pain.  Right coronary artery.  The right coronary artery is large and  dominant. There is 20% proximal stenosis.  There is some filling of this above- mentioned septal branch from the distal right coronary artery.  Again, it does not supply any significant major distribution, but it may explain some of her chest pain.  It is certainly not a candidate for revascularization and is a very tiny and insignificant branch.  The left ventriculogram was performed in the 30 RAO position.  Reveals normal left ventricular systolic function.  There is mild mitral regurgitation.  There is mild-to-moderate mitral valve prolapse.  There is no complications.  PROBLEMS: 1. Normal right heart pressures 2. Normal cardiac output. 3. Minor coronary artery irregularities.  She has a 20% stenosis in     the proximal right coronary.  There is also evidence of a previous     occlusion of a very tiny septal branch or perhaps right ventricular     septal branch.  This can be seen via collateral vessels from the     left and the right.  It may be the cause of her chest pain.  She is     not a candidate for any type of revascularization.  It is not     clinically significant most likely. 4. Normal left ventricular systolic function.  She has mild mitral     regurgitation associated with the mitral valve prolapse.  I     anticipate sending her home tonight.  She will follow up with Dr.     Shirlee Latch.     Vesta Mixer, M.D.     PJN/MEDQ  D:  03/26/2011  T:  03/26/2011  Job:  [id number]  Electronically Signed by Kristeen Miss M.D. on 04/09/2011 09:52:22 AM

## 2011-04-10 NOTE — Telephone Encounter (Signed)
Order placed for ct and rov after in 6 months. Carron Curie, CMA

## 2011-04-14 ENCOUNTER — Ambulatory Visit (INDEPENDENT_AMBULATORY_CARE_PROVIDER_SITE_OTHER): Payer: BC Managed Care – PPO

## 2011-04-14 DIAGNOSIS — Z23 Encounter for immunization: Secondary | ICD-10-CM

## 2011-04-22 ENCOUNTER — Encounter: Payer: Self-pay | Admitting: Cardiology

## 2011-04-22 ENCOUNTER — Ambulatory Visit (INDEPENDENT_AMBULATORY_CARE_PROVIDER_SITE_OTHER): Payer: BC Managed Care – PPO | Admitting: Cardiology

## 2011-04-22 VITALS — BP 118/70 | HR 60 | Ht 69.0 in | Wt 129.0 lb

## 2011-04-22 DIAGNOSIS — I340 Nonrheumatic mitral (valve) insufficiency: Secondary | ICD-10-CM

## 2011-04-22 DIAGNOSIS — R079 Chest pain, unspecified: Secondary | ICD-10-CM

## 2011-04-22 DIAGNOSIS — R002 Palpitations: Secondary | ICD-10-CM

## 2011-04-22 DIAGNOSIS — I059 Rheumatic mitral valve disease, unspecified: Secondary | ICD-10-CM

## 2011-04-22 NOTE — Patient Instructions (Signed)
Your physician wants you to follow-up in: 1 year with Dr Shirlee Latch. (October 2013). You will receive a reminder letter in the mail two months in advance. If you don't receive a letter, please call our office to schedule the follow-up appointment.   Your physician has requested that you have an echocardiogram. Echocardiography is a painless test that uses sound waves to create images of your heart. It provides your doctor with information about the size and shape of your heart and how well your heart's chambers and valves are working. This procedure takes approximately one hour. There are no restrictions for this procedure. IN ONE YEAR A FEW DAYS BEFORE THE APPOINTMENT WITH DR [facility]

## 2011-04-23 NOTE — Progress Notes (Signed)
PCP: Dr. Caryl Never  65 yo with history of mitral valve prolapse and mitral regurgitation presents for followup.  Workup was done for mitral regurgitation this summer.  She has bileaflet MV prolapse.  It was moderate to severe by TEE.  ETT showed excellent exercise tolerance with no evidence for exercise limitation from MR.  We planned to continue monitoring her mitral regurgitation periodically.   Patient was then admitted in 10/12 with severe chest pain while working in her yard.  She had a left heart cath showing only mild luminal irregularities. Echo showed moderate mitral regurgitation.  3 week event monitor showed only PACs.  I am unsure of exactly what caused her severe chest pain.  She has had no recurrence.  She has occasional fluttering in her chest that is likely attributable to the PACs.  She is active and walks for exercise with no exertional dyspnea or chest pain.  She had a thyroid nodule biopsy that turned out benign.  She is still undergoing workup of lung nodules.    Labs (5/12): TSH normal, LDL 68, HDL 58, cardiac enzymes negative x 3  PMH:  1. Mitral valve prolapse: Known since teenager.  Echo (5/12): EF 60%, normal LV size, grade II diastolic dysfunction, myxomatous mitral valve with bileaflet prolapse and moderate to severe mitral regurgitation, mild to moderate TR, PA systolic pressure 39 mmHg.  TEE (6/12) with EF 60%, moderate to severe MR with bileaflet prolapse and no systolic flow reversal in the pulmonary vein doppler signal, mild to moderate LAE.  ETT for exercise capacity (7/12): 12'19" on treadmill, no evidence of symptomatic limitation from mitral regurgitation. Echo (10/12): EF 60-65%, severe MVP with moderate MR.   2. Right shoulder surgery 2012.  3. Pulmonary nodule: followed by Dr. Marchelle Gearing 4. Thyroid nodule s/p biopsy: benign.  5. TAH-BSO due to endometriosis.  6. Appendectomy.  7. Chest pain: Lexiscan myoview (6/12):  EF 63%, no ischemia or infarction.  Left heart  cath (10/12): Mild luminal irregularities only.  8. Palpitations: 3-week event monitor (10/12) with PACs, otherwise no significant arrhythmia.   SH: Quit smoking at age 5.  Moved to [place] from [place] several years ago.  Married.  2 glasses wine/night.   FH: No CAD or valvular disease.   ROS: all systems reviewed and negative except as per HPI.   Current Outpatient Prescriptions  Medication Sig Dispense Refill  . ALPRAZolam (XANAX) 0.5 MG tablet As needed for flying      . aspirin 81 MG tablet Take 81 mg by mouth daily.        Marland Kitchen losartan (COZAAR) 25 MG tablet Take 1 tablet (25 mg total) by mouth daily.  30 tablet  11  . propranolol (INDERAL) 10 MG tablet Take 1 tablet (10 mg total) by mouth 3 (three) times daily.  90 tablet  11  . zolpidem (AMBIEN) 10 MG tablet TAKE 1 TABLET BY MOUTH EVERY NIGHT AT BEDTIME AS NEEDED  30 tablet  0    BP 118/70  Pulse 60  Ht 5\' 9"  (1.753 m)  Wt 129 lb (58.514 kg)  BMI 19.05 kg/m2 General: NAD Neck: No JVD, no thyromegaly or thyroid nodule.  Lungs: Clear to auscultation bilaterally with normal respiratory effort. CV: Nondisplaced PMI.  Heart regular S1/S2, no S3/S4, 3/6 mid to late systolic murmur at apex.  No peripheral edema.  No carotid bruit.  Normal pedal pulses.  Abdomen: Soft, nontender, no hepatosplenomegaly, no distention.  Neurologic: Alert and oriented x 3.  Psych: Normal  affect. Extremities: No clubbing or cyanosis.

## 2011-04-23 NOTE — Assessment & Plan Note (Signed)
Bileaflet mitral valve prolapse with probably moderate mitral regurgitation.  She is not symptomatic from the MR and LV chamber size and EF are normal.  Continue medical management.  I will repeat an echo for progression in 10/13 and she will followup with me after the echo.

## 2011-04-23 NOTE — Assessment & Plan Note (Signed)
I suspect that these are due to PACs.  She will continue propranolol.

## 2011-04-23 NOTE — Assessment & Plan Note (Signed)
Catheterization showed only luminal irregularities.  I am not sure what caused the chest pain but can find no evidence for a cardiac etiology.  It has not recurred.

## 2011-04-28 ENCOUNTER — Telehealth: Payer: Self-pay | Admitting: Internal Medicine

## 2011-04-28 NOTE — Telephone Encounter (Signed)
Called and spoke with pt. Pt states ever since having thyroid removed on 04/01/11 , she has had a sensation in her throat.  States it feels like a "pill stuck in throat."  States this sensation isn't all the time but is concerned about this. Pt states Dr. Jenne Pane did her thyroid surgery.  Informed pt to call Dr. Jenne Pane' office first regarding this.  Pt verbalized understanding and denied any questions.

## 2011-04-29 LAB — FUNGUS CULTURE W SMEAR: Fungal Smear: NONE SEEN

## 2011-05-01 HISTORY — PX: THYROID CYST EXCISION: SHX2511

## 2011-05-11 ENCOUNTER — Other Ambulatory Visit: Payer: Self-pay | Admitting: Family Medicine

## 2011-05-11 NOTE — Telephone Encounter (Signed)
This was called in earlier this morning. 

## 2011-05-11 NOTE — Telephone Encounter (Signed)
Refill Zolpidem to [facility]. Going out of town tomorrow. Thanks. ( see escribe request from pharmacy)

## 2011-05-15 LAB — AFB CULTURE WITH SMEAR (NOT AT ARMC)

## 2011-05-18 ENCOUNTER — Telehealth: Payer: Self-pay | Admitting: Family Medicine

## 2011-05-18 DIAGNOSIS — I341 Nonrheumatic mitral (valve) prolapse: Secondary | ICD-10-CM | POA: Insufficient documentation

## 2011-05-18 DIAGNOSIS — R5383 Other fatigue: Secondary | ICD-10-CM

## 2011-05-18 NOTE — Telephone Encounter (Signed)
Pulled from Triage vmail. Pt states she needs to talk with you about thyroid. Says she's run down, etc - wants to know if she needs labs or appt? Please call & advise.

## 2011-05-18 NOTE — Telephone Encounter (Signed)
Patient complaining of profound fatigue. Had recent subtotal thyroidectomy. She states most of her thyroid was removed. Throughout have benign cysts. She's not had any thyroid function since then. Surgery was a little over 6 weeks ago. Also, she has been recently started on beta blockers. She has tried resume running but has had increased fatigue. Denies chest pains. No dyspnea.  We discussed setting up labs and then having her come in for followup. We'll check thyroid functions along with some electrolytes. I have ordered labs and need to schedule for labs and follow up office appt after labs.

## 2011-05-19 ENCOUNTER — Telehealth: Payer: Self-pay | Admitting: Cardiology

## 2011-05-19 ENCOUNTER — Other Ambulatory Visit (INDEPENDENT_AMBULATORY_CARE_PROVIDER_SITE_OTHER): Payer: BC Managed Care – PPO

## 2011-05-19 DIAGNOSIS — R5383 Other fatigue: Secondary | ICD-10-CM

## 2011-05-19 DIAGNOSIS — R5381 Other malaise: Secondary | ICD-10-CM

## 2011-05-19 LAB — BASIC METABOLIC PANEL
BUN: 18 mg/dL (ref 6–23)
CO2: 23 mEq/L (ref 19–32)
Chloride: 109 mEq/L (ref 96–112)
Creatinine, Ser: 0.8 mg/dL (ref 0.4–1.2)
Glucose, Bld: 90 mg/dL (ref 70–99)
Potassium: 4.5 mEq/L (ref 3.5–5.1)

## 2011-05-19 LAB — TSH: TSH: 1.79 u[IU]/mL (ref 0.35–5.50)

## 2011-05-19 NOTE — Telephone Encounter (Signed)
Appointments made

## 2011-05-19 NOTE — Telephone Encounter (Signed)
lmom for pt to call back

## 2011-05-19 NOTE — Telephone Encounter (Signed)
She can try stopping it to see if cracked lips improve but palpitations may worsen.

## 2011-05-19 NOTE — Telephone Encounter (Signed)
LMTCB

## 2011-05-19 NOTE — Telephone Encounter (Signed)
New problem Pt has been having sores on her lips. She thinks it is propanolol. Please call her back

## 2011-05-19 NOTE — Telephone Encounter (Signed)
I talked with pt. Pt states for about 1 month she has had dry cracked lips. Pt states she has been working with her dentist to try and manage this.She saw her dentist today and he gave her some medication to try. Her dentist suggested to her that propranolol may be causing dryness of her lips and mouth. I will forward to Dr Shirlee Latch for review and recommendations.

## 2011-05-20 NOTE — Progress Notes (Signed)
Quick Note:  Pt informed on personally identified VM ______ 

## 2011-05-22 NOTE — Telephone Encounter (Signed)
Pt out of town. Alternate # [id number] LMTCB

## 2011-05-25 ENCOUNTER — Encounter: Payer: BC Managed Care – PPO | Admitting: Women's Health

## 2011-05-25 NOTE — Telephone Encounter (Signed)
Fu Call: Pt returning call to [facility]. Please call back.

## 2011-05-25 NOTE — Telephone Encounter (Signed)
I talked with pt and she is aware of Dr Alford Highland recommendations.

## 2011-05-27 ENCOUNTER — Ambulatory Visit: Payer: BC Managed Care – PPO | Admitting: Family Medicine

## 2011-06-01 ENCOUNTER — Ambulatory Visit (INDEPENDENT_AMBULATORY_CARE_PROVIDER_SITE_OTHER): Payer: BC Managed Care – PPO | Admitting: Women's Health

## 2011-06-01 ENCOUNTER — Encounter: Payer: Self-pay | Admitting: Women's Health

## 2011-06-01 ENCOUNTER — Other Ambulatory Visit (HOSPITAL_COMMUNITY)
Admission: RE | Admit: 2011-06-01 | Discharge: 2011-06-01 | Disposition: A | Discharge status: Home / Self Care | Payer: BC Managed Care – PPO | Source: Ambulatory Visit | Attending: Obstetrics and Gynecology | Admitting: Obstetrics and Gynecology

## 2011-06-01 ENCOUNTER — Other Ambulatory Visit: Payer: Self-pay | Admitting: Family Medicine

## 2011-06-01 DIAGNOSIS — Z01419 Encounter for gynecological examination (general) (routine) without abnormal findings: Secondary | ICD-10-CM | POA: Insufficient documentation

## 2011-06-01 NOTE — Progress Notes (Signed)
[name] 1945/10/09 [id number]    History:    The patient presents for annual exam.  Retired.  Past medical history, past surgical history, family history and social history were all reviewed and documented in the EPIC chart.   ROS:  A  ROS was performed and pertinent positives and negatives are included in the history.  Exam:  Filed Vitals:   06/01/11 1510  BP: 122/74    General appearance:  Normal Head/Neck:  Normal, without cervical or supraclavicular adenopathy. Thyroid:  Symmetrical, normal in size, without palpable masses or nodularity. Respiratory  Effort:  Normal  Auscultation:  Clear without wheezing or rhonchi Cardiovascular  Auscultation:  Regular rate, without rubs, murmurs or gallops  Edema/varicosities:  Not grossly evident Abdominal  Soft,nontender, without masses, guarding or rebound.  Liver/spleen:  No organomegaly noted  Hernia:  None appreciated  Skin  Inspection:  Grossly normal  Palpation:  Grossly normal Neurologic/psychiatric  Orientation:  Normal with appropriate conversation.  Mood/affect:  Normal  Genitourinary    Breasts: Examined lying and sitting.     Right: Without masses, retractions, discharge or axillary adenopathy.     Left: Without masses, retractions, discharge or axillary adenopathy.   Inguinal/mons:  Normal without inguinal adenopathy  External genitalia:  Normal  BUS/Urethra/Skene's glands:  Normal  Bladder:  Normal  Vagina:  Normal  Cervix:  Normal  Uterus:   normal in size, shape and contour.  Midline and mobile  Adnexa/parametria:     Rt: Without masses or tenderness.   Lt: Without masses or tenderness.  Anus and perineum: Normal  Digital rectal exam: Normal sphincter tone without palpated masses or tenderness  Assessment/Plan:  65 y.o. MWF G3 P3 for annual exam. Postmenopausal, history of TAH with BSO for fibroids/ no ERT.  Zostovac vaccine in 08. History of osteopenia 09, states insurance does not cover and declines  repeat study. Negative colonoscopy in 08. Had a thyroidectomy this past year benign nodules. History of normal mammograms in [place].  Osteopenia/declines treatment or followup Insomnia/primary care  Plan: Pap only labs at primary care. SBEs, continue annual screening, calcium rich diet, vitamin D 2000 daily encouraged. Fall prevention and home safety discussed.      [name] [facility], 3:55 PM 06/01/2011

## 2011-06-02 NOTE — Telephone Encounter (Signed)
Nothing e-requested - encounter closed

## 2011-06-03 ENCOUNTER — Other Ambulatory Visit: Payer: Self-pay | Admitting: Family Medicine

## 2011-06-03 MED ORDER — ZOLPIDEM TARTRATE 10 MG PO TABS: ORAL_TABLET | ORAL | Status: DC

## 2011-06-03 NOTE — Telephone Encounter (Signed)
Ambien in our record as needed for flying.  Has not been filled yet in Epic.

## 2011-06-03 NOTE — Telephone Encounter (Signed)
May refill ambien 10 mg #15 one po qhs prn insomnia.

## 2011-06-03 NOTE — Telephone Encounter (Signed)
Pt requesting refill on zolpidem (AMBIEN) 10 MG tablet    [facility] [address]

## 2011-06-05 ENCOUNTER — Telehealth: Payer: Self-pay | Admitting: Family Medicine

## 2011-06-05 ENCOUNTER — Telehealth: Payer: Self-pay | Admitting: *Deleted

## 2011-06-05 MED ORDER — ZOLPIDEM TARTRATE 10 MG PO TABS: ORAL_TABLET | ORAL | Status: DC

## 2011-06-05 NOTE — Telephone Encounter (Signed)
Pt called to say she uses ambien every night, please correct sig on rx.  Her insurance will only pay for 20, so she pays for 10 out of pocket

## 2011-06-05 NOTE — Telephone Encounter (Signed)
Pt. needed quantity to be for 20 instead of 15 for the zolpidem (AMBIEN) 10 MG tablet ...has not picked up prescription yet. Please give pt. a call

## 2011-06-05 NOTE — Telephone Encounter (Signed)
Message left for pt on home VM and pharmacy, OK to increase dose to #20 with 0 refills

## 2011-06-18 ENCOUNTER — Telehealth: Payer: Self-pay | Admitting: Family Medicine

## 2011-06-18 NOTE — Telephone Encounter (Signed)
[name] will schedule pt with Dr Tawanna Cooler tomorrow, OK same day acute per Tim Lair

## 2011-06-18 NOTE — Telephone Encounter (Signed)
Pt has a sinus inf and is req call back from nurse or get a refill of abx for sinus inf called in to [facility] [phone]. Pt said that she had lft a vm on nurses vm earlier today.

## 2011-06-19 ENCOUNTER — Ambulatory Visit (INDEPENDENT_AMBULATORY_CARE_PROVIDER_SITE_OTHER): Payer: BC Managed Care – PPO | Admitting: Family Medicine

## 2011-06-19 ENCOUNTER — Encounter: Payer: Self-pay | Admitting: Family Medicine

## 2011-06-19 VITALS — BP 110/74 | Temp 98.1°F | Wt 133.0 lb

## 2011-06-19 DIAGNOSIS — J309 Allergic rhinitis, unspecified: Secondary | ICD-10-CM

## 2011-06-19 DIAGNOSIS — J302 Other seasonal allergic rhinitis: Secondary | ICD-10-CM | POA: Insufficient documentation

## 2011-06-19 MED ORDER — PREDNISONE 20 MG PO TABS: ORAL_TABLET | ORAL | Status: DC

## 2011-06-19 NOTE — Patient Instructions (Signed)
Take the prednisone as directed.  If you taper the prednisone  And  your symptoms recur, then I would recommend taking a plain  10-mg Zyrtec tablet nightly at bedtime for a couple months

## 2011-06-19 NOTE — Progress Notes (Signed)
  Subjective:    Patient ID: [name], female    DOB: 05-12-1946, 65 y.o.   MRN: [id number]  HPI K. Is a 65 year old, married female, nonsmoker, who comes in today with a 4-day history of allergic rhinitis.  She states she has a sinus infection and has been treated in the past with antibiotics.  Her symptoms or head congestion, postnasal drip, some mild mild discomfort, and frontal sinuses.  No fever no chills, etc., etc. No asthma.  She has a history of seasonal allergic rhinitis usually in the fall.   Review of Systems    Genital and ENT review of systems otherwise negative Objective:   Physical Exam  Well-developed well-nourished, female in no acute distress.  HEENT were negative, except the septum was in the midline.  She has 3+ nasal edema, consistent with allergic rhinitis      Assessment & Plan:  Allergic rhinitis.  Plan discussed treatment options, she elects to take a short course of oral prednisone

## 2011-06-30 ENCOUNTER — Telehealth: Payer: Self-pay | Admitting: *Deleted

## 2011-06-30 MED ORDER — ZOLPIDEM TARTRATE 10 MG PO TABS: 10.0000 mg | ORAL_TABLET | Freq: Every day | ORAL | Status: DC

## 2011-06-30 NOTE — Telephone Encounter (Signed)
OK to refill

## 2011-06-30 NOTE — Telephone Encounter (Signed)
Pt requesting #30 with 5 refills.

## 2011-06-30 NOTE — Telephone Encounter (Signed)
Please refill [name] Ambien with refills as was discussed with Harriett Sine in December, pt states.  Please call her if you have any questions, but she states Harriett Sine is aware of the details.

## 2011-07-01 ENCOUNTER — Telehealth: Payer: Self-pay | Admitting: *Deleted

## 2011-07-01 NOTE — Telephone Encounter (Signed)
Pt. Would like to speak to [place] re her Ambien refills and some sinus complaints that she has.  Saw Dr Tawanna Cooler, but does not like the Prednisone, and wants to know what Dr. Caryl Never thinks?

## 2011-07-02 NOTE — Telephone Encounter (Signed)
Pt informed, she is scheduling appt for tomorrow

## 2011-07-02 NOTE — Telephone Encounter (Signed)
Followup to reassess

## 2011-07-02 NOTE — Telephone Encounter (Signed)
LMTCB

## 2011-07-02 NOTE — Telephone Encounter (Signed)
Pt reports she saw Dr Tawanna Cooler when Dr Caryl Never was gone for sinus problems, headache issues.  He put her on Prednisone taper and pt did finish it, she did not do the 1/2 tab every other day because of the was she felt.  Reports the prednisone made her itich, felt "jittery". She is still C/O sinus pressure with headache.  What to do next?

## 2011-07-03 ENCOUNTER — Encounter: Payer: Self-pay | Admitting: Family Medicine

## 2011-07-03 ENCOUNTER — Ambulatory Visit (INDEPENDENT_AMBULATORY_CARE_PROVIDER_SITE_OTHER): Payer: BC Managed Care – PPO | Admitting: Family Medicine

## 2011-07-03 VITALS — BP 110/70 | Temp 97.7°F | Wt 134.0 lb

## 2011-07-03 DIAGNOSIS — J329 Chronic sinusitis, unspecified: Secondary | ICD-10-CM

## 2011-07-03 MED ORDER — AMOXICILLIN-POT CLAVULANATE 875-125 MG PO TABS: 1.0000 | ORAL_TABLET | Freq: Two times a day (BID) | ORAL | Status: AC

## 2011-07-03 NOTE — Patient Instructions (Signed)

## 2011-07-03 NOTE — Progress Notes (Signed)
  Subjective:    Patient ID: [name], female    DOB: Aug 07, 1945, 66 y.o.   MRN: [id number]  HPI  Patient seen with persistent sinus pressure mostly maxillary region bilaterally with some frontal sinus pressure as well. Recently seen with presumed allergic rhinitis. Took prednisone without much improvement. She did not finish course because of some side effects. She denies any fever. Minimal anterior drainage with some postnasal drainage. Intermittent headaches. Some discolored mucus. Rare cough. Malaise off and on  Review of Systems  Constitutional: Positive for fatigue. Negative for fever and chills.  HENT: Positive for congestion and sinus pressure.   Respiratory: Negative for cough.        Objective:   Physical Exam  Constitutional: She appears well-developed and well-nourished.  HENT:  Right Ear: External ear normal.  Left Ear: External ear normal.  Mouth/Throat: Oropharynx is clear and moist.  Neck: Neck supple.  Cardiovascular: Normal rate and regular rhythm.   Pulmonary/Chest: Effort normal and breath sounds normal. No respiratory distress. She has no wheezes. She has no rales.          Assessment & Plan:  Probable acute sinusitis. Augmentin 875 mg twice a day for 10 days.

## 2011-07-07 ENCOUNTER — Other Ambulatory Visit: Payer: Self-pay | Admitting: Family Medicine

## 2011-07-07 MED ORDER — FLUCONAZOLE 150 MG PO TABS: 150.0000 mg | ORAL_TABLET | Freq: Once | ORAL | Status: AC

## 2011-07-07 MED ORDER — FLUCONAZOLE 150 MG PO TABS: 150.0000 mg | ORAL_TABLET | Freq: Once | ORAL | Status: DC

## 2011-07-07 NOTE — Telephone Encounter (Signed)
LMTCB

## 2011-07-07 NOTE — Telephone Encounter (Signed)
Pt is calling back for Diflucan.

## 2011-07-07 NOTE — Telephone Encounter (Signed)
Pt says that she has been on abx, she will need to get a refill of fluconazole (DIFLUCAN) 150 MG tablet called in to [facility] on [address].

## 2011-07-07 NOTE — Telephone Encounter (Signed)
OK to call in Diflucan 150 mg po times one dose. 

## 2011-07-07 NOTE — Telephone Encounter (Signed)
Pt. Notified.

## 2011-11-02 ENCOUNTER — Ambulatory Visit (INDEPENDENT_AMBULATORY_CARE_PROVIDER_SITE_OTHER): Payer: BC Managed Care – PPO | Admitting: Family Medicine

## 2011-11-02 ENCOUNTER — Encounter: Payer: Self-pay | Admitting: Family Medicine

## 2011-11-02 DIAGNOSIS — J019 Acute sinusitis, unspecified: Secondary | ICD-10-CM

## 2011-11-02 MED ORDER — AMOXICILLIN-POT CLAVULANATE 875-125 MG PO TABS: 1.0000 | ORAL_TABLET | Freq: Two times a day (BID) | ORAL | Status: DC

## 2011-11-02 NOTE — Progress Notes (Signed)
  Subjective:    Patient ID: [name], female    DOB: 03-01-1946, 66 y.o.   MRN: [id number]  HPI  Progressive sinus symptoms. Bilateral maxillary facial pain and pressure over one week duration. Intermittent earache. She has occasional fleeting vertigo. Some postnasal drip symptoms. Minimal if any cough. Minimal sore throat. No fever or chills. History of frequent sinusitis in the past. Patient is hydrating well. No relief with over-the-counter medications.   Review of Systems  Constitutional: Positive for fatigue. Negative for fever and chills.  HENT: Positive for congestion, postnasal drip and sinus pressure. Negative for voice change.   Respiratory: Positive for cough.   Neurological: Positive for headaches.       Objective:   Physical Exam  Constitutional: She appears well-developed and well-nourished.  HENT:  Right Ear: External ear normal.  Left Ear: External ear normal.  Mouth/Throat: Oropharynx is clear and moist.  Neck: Neck supple.  Cardiovascular: Normal rate and regular rhythm.   Pulmonary/Chest: Effort normal and breath sounds normal. No respiratory distress. She has no wheezes. She has no rales.  Lymphadenopathy:    She has no cervical adenopathy.          Assessment & Plan:  Acute sinusitis. Augmentin 875 mg twice daily for 10 days

## 2011-11-03 ENCOUNTER — Telehealth: Payer: Self-pay | Admitting: *Deleted

## 2011-11-03 MED ORDER — FLUCONAZOLE 150 MG PO TABS: 150.0000 mg | ORAL_TABLET | Freq: Once | ORAL | Status: AC

## 2011-11-03 NOTE — Telephone Encounter (Signed)
Pt informed Rx sent. 

## 2011-11-03 NOTE — Telephone Encounter (Signed)
Was seen yesterday for sinusitis and was given augmenten--pt calls this am stating she needs diflucan since taking antibioitc

## 2011-11-03 NOTE — Telephone Encounter (Signed)
Diflucan 150mg x 1 dose

## 2011-11-12 ENCOUNTER — Telehealth: Payer: Self-pay | Admitting: Internal Medicine

## 2011-11-12 NOTE — Telephone Encounter (Signed)
Order for CT is already placed in [facility] is aware they will contacted with CT apt then will schedule apt with MR. Per spouse they are going out of town next week and would like this set up 1st week of June. Please advise [facility] thanks

## 2011-11-12 NOTE — Telephone Encounter (Signed)
Chest ct @[facility]  11/21/11@10 :00am pt aware pre cert# [id number]

## 2011-11-12 NOTE — Telephone Encounter (Signed)
Husband calling back

## 2011-11-18 ENCOUNTER — Telehealth: Payer: Self-pay | Admitting: Internal Medicine

## 2011-11-18 NOTE — Telephone Encounter (Signed)
[name] 11/12/2011 3:47 PM Signed  Chest ct @[facility]  11/21/11@10 :00am pt aware pre cert# [id number]  Order was placed on 03-2011 states pending approval from [employer] so I want to make sure that the CT has been approved; Almyra Free has gone for the evening but will send to [facility] box to have her advise. Left message for patients spouse that [facility] or Triage will call once we know for sure this has been approved.

## 2011-11-19 NOTE — Telephone Encounter (Signed)
Patient calling requesting a call back on cell #[phone]

## 2011-11-19 NOTE — Telephone Encounter (Signed)
Pt's appt is 11/24/11@10 :00am she has been informed of the preauth but needs to ck her benefits to see what % it will pay Tobe Sos

## 2011-11-24 ENCOUNTER — Ambulatory Visit (INDEPENDENT_AMBULATORY_CARE_PROVIDER_SITE_OTHER)
Admission: RE | Admit: 2011-11-24 | Discharge: 2011-11-24 | Disposition: A | Discharge status: Home / Self Care | Payer: BC Managed Care – PPO | Source: Ambulatory Visit | Attending: Internal Medicine | Admitting: Internal Medicine

## 2011-11-24 DIAGNOSIS — R911 Solitary pulmonary nodule: Secondary | ICD-10-CM

## 2011-11-25 ENCOUNTER — Telehealth: Payer: Self-pay | Admitting: Internal Medicine

## 2011-11-25 ENCOUNTER — Other Ambulatory Visit: Payer: Self-pay | Admitting: Women's Health

## 2011-11-25 DIAGNOSIS — R918 Other nonspecific abnormal finding of lung field: Secondary | ICD-10-CM

## 2011-11-25 DIAGNOSIS — Z1231 Encounter for screening mammogram for malignant neoplasm of breast: Secondary | ICD-10-CM

## 2011-11-25 NOTE — Telephone Encounter (Signed)
No change in nodule size in 1 year. Spoke to patient: she is feeling and doing well. No complaints. No need to come in to discuss. Needs fu CT in 9 months and then I call her with results. She is fine with plan. Please ensure CT in 9 months (orderd)

## 2011-12-18 ENCOUNTER — Ambulatory Visit (HOSPITAL_COMMUNITY): Payer: BC Managed Care – PPO

## 2011-12-31 ENCOUNTER — Encounter: Payer: Self-pay | Admitting: Family

## 2011-12-31 ENCOUNTER — Ambulatory Visit (INDEPENDENT_AMBULATORY_CARE_PROVIDER_SITE_OTHER): Payer: BC Managed Care – PPO | Admitting: Family

## 2011-12-31 VITALS — BP 140/90 | Temp 98.1°F | Wt 133.0 lb

## 2011-12-31 DIAGNOSIS — J019 Acute sinusitis, unspecified: Secondary | ICD-10-CM

## 2011-12-31 DIAGNOSIS — R0982 Postnasal drip: Secondary | ICD-10-CM

## 2011-12-31 MED ORDER — METHYLPREDNISOLONE ACETATE 40 MG/ML IJ SUSP: 80.0000 mg | Freq: Once | INTRAMUSCULAR | Status: DC

## 2011-12-31 MED ORDER — CEFUROXIME AXETIL 250 MG PO TABS: 250.0000 mg | ORAL_TABLET | Freq: Two times a day (BID) | ORAL | Status: AC

## 2011-12-31 MED ORDER — METHYLPREDNISOLONE ACETATE 40 MG/ML IJ SUSP
80.0000 mg | Freq: Once | INTRAMUSCULAR | Status: AC
  Administered 2011-12-31: 80 mg via INTRAMUSCULAR

## 2011-12-31 NOTE — Patient Instructions (Addendum)

## 2011-12-31 NOTE — Progress Notes (Signed)
Subjective:    Patient ID: [name], female    DOB: Jan 08, 1946, 66 y.o.   MRN: [id number]  HPI 66 year old white female, nonsmoker, patient of Dr. Caryl Never is in today with complaints of ear ache, sore throat, sinus pressure and pain x1 week. She has a history of sinusitis. She has not taken any over-the-counter medications. Denies any fever, muscle aches or pain. She was most recently treated in May 2013 with Augmentin by Dr. Caryl Never.   Review of Systems  Constitutional: Positive for fatigue.  HENT: Positive for ear pain, congestion, sore throat and sinus pressure.        Facial pain  Eyes: Negative.   Respiratory: Negative.   Cardiovascular: Negative.   Musculoskeletal: Negative.   Neurological: Negative.  Negative for light-headedness, numbness and headaches.  Hematological: Negative.   Psychiatric/Behavioral: Negative.    Past Medical History  Diagnosis Date  . ANXIETY 02/19/2009  . INSOMNIA, CHRONIC 03/29/2009  . Migraine   . Heart murmur   . Skin cancer of arm     Squamous  . MVP (mitral valve prolapse)     NO MEDS  . Thyroid disease     History   Social History  . Marital Status: Married    Spouse Name: N/A    Number of Children: N/A  . Years of Education: N/A   Occupational History  . retired    Social History Main Topics  . Smoking status: Former Smoker -- 1.0 packs/day for 20 years    Types: Cigarettes    Quit date: 08/11/1970  . Smokeless tobacco: Never Used  . Alcohol Use: 4.2 oz/week    7 Glasses of wine per week  . Drug Use: No  . Sexually Active: Not Currently   Other Topics Concern  . Not on file   Social History Narrative  . No narrative on file    Past Surgical History  Procedure Date  . Appendectomy 1969  . Tonsillectomy 1980  . Abdominal hysterectomy 1980    WITH BSO/FIBROIDS  . Oophorectomy     BSO WITH ABDOMINAL HYSTERECTOMY  . Thyroid cyst excision 05-01-11    RIGHT    Family History  Problem Relation Age of Onset    . Diabetes Mother     type ll  . Alcohol abuse Father   . Cancer Father     THROAT  . Cancer Maternal Aunt     ovarian  . Cancer Maternal Grandfather     breast  . Breast cancer Maternal Grandmother     Allergies  Allergen Reactions  . Inderal (Propranolol Hcl)     SEVERE LIP AND SKIN DRYNESS    Current Outpatient Prescriptions on File Prior to Visit  Medication Sig Dispense Refill  . ALPRAZolam (XANAX) 0.5 MG tablet As needed for flying      . aspirin 81 MG tablet Take 81 mg by mouth daily.        . valACYclovir (VALTREX) 1000 MG tablet       . zolpidem (AMBIEN) 10 MG tablet Take 1 tablet (10 mg total) by mouth at bedtime.  30 tablet  5   No current facility-administered medications on file prior to visit.    BP 140/90  Temp 98.1 F (36.7 C) (Oral)  Wt 133 lb (60.328 kg)  LMP 01/01/1979chart    Objective:   Physical Exam  Constitutional: She is oriented to person, place, and time. She appears well-developed and well-nourished.  HENT:  Right Ear: External ear  normal.  Left Ear: External ear normal.  Nose: Nose normal.  Mouth/Throat: Oropharynx is clear and moist.       Sinus tenderness to palpation of the maxillary sinuses bilaterally. Nasal turbinates swollen and boggy.  Neck: Normal range of motion. Neck supple.  Cardiovascular: Normal rate, regular rhythm and normal heart sounds.   Pulmonary/Chest: Effort normal and breath sounds normal.  Neurological: She is alert and oriented to person, place, and time.  Skin: Skin is warm and dry.  Psychiatric: She has a normal mood and affect.          Assessment & Plan:  Assessment: Acute sinusitis, facial pain  Plan: Ceftin 250 mg one tablet twice a day x10 days. Depo-Medrol was given in the office 80 mg IM x1. Patient advised call the office if symptoms worsen or persist. Recheck a schedule, appearing. Consider allergy referral.  I offered a nasal steroid patient declines.

## 2012-01-01 ENCOUNTER — Ambulatory Visit (HOSPITAL_COMMUNITY)
Admission: RE | Admit: 2012-01-01 | Discharge: 2012-01-01 | Disposition: A | Discharge status: Home / Self Care | Payer: BC Managed Care – PPO | Source: Ambulatory Visit | Attending: Women's Health | Admitting: Women's Health

## 2012-01-01 ENCOUNTER — Telehealth: Payer: Self-pay | Admitting: Family Medicine

## 2012-01-01 DIAGNOSIS — Z1231 Encounter for screening mammogram for malignant neoplasm of breast: Secondary | ICD-10-CM | POA: Insufficient documentation

## 2012-01-01 MED ORDER — FLUCONAZOLE 150 MG PO TABS: ORAL_TABLET | ORAL | Status: DC

## 2012-01-01 NOTE — Telephone Encounter (Signed)
Rx sent to pharmacy   

## 2012-01-01 NOTE — Telephone Encounter (Signed)
ok 

## 2012-01-01 NOTE — Telephone Encounter (Signed)
Pts spouse came by office and said that his wife is re: refill of med that she take for Yeast Inf. (Fluconazole ?) Pt is going out of town for a wk and needs this called in today to [facility] on [address] [phone].

## 2012-01-07 ENCOUNTER — Ambulatory Visit (HOSPITAL_COMMUNITY): Payer: BC Managed Care – PPO

## 2012-01-11 ENCOUNTER — Telehealth: Payer: Self-pay | Admitting: Family Medicine

## 2012-01-11 NOTE — Telephone Encounter (Signed)
Caller: [place]/Patient; PCP: Adline Mango; CB#: [phone]; Call regarding R. Ear Pain and Sinus Pressure;  Onset: approx 12/28/11. Afebrile. On Cefuroxime BID since office visit 01/04/12; diagnosed with sinus infection.  Continues to have intermittent headaches in the morning with ear discomfort. Advised to see MD within 24 hrs for feeling of fullness per Ear Symptoms Guideline.  Requested appt for 01/13/12 PM; Instructed to call scheduler for appt beyond next day.

## 2012-01-12 ENCOUNTER — Other Ambulatory Visit: Payer: Self-pay | Admitting: Family Medicine

## 2012-01-12 NOTE — Telephone Encounter (Signed)
Pt has sch appt for Wednesday 01-13-2012. Pt decline to see another provider

## 2012-01-12 NOTE — Telephone Encounter (Signed)
noted 

## 2012-01-13 ENCOUNTER — Encounter: Payer: Self-pay | Admitting: Family Medicine

## 2012-01-13 ENCOUNTER — Telehealth: Payer: Self-pay | Admitting: Family Medicine

## 2012-01-13 ENCOUNTER — Ambulatory Visit (INDEPENDENT_AMBULATORY_CARE_PROVIDER_SITE_OTHER): Payer: BC Managed Care – PPO | Admitting: Family Medicine

## 2012-01-13 VITALS — BP 122/62 | Temp 98.1°F | Wt 132.0 lb

## 2012-01-13 DIAGNOSIS — J329 Chronic sinusitis, unspecified: Secondary | ICD-10-CM

## 2012-01-13 DIAGNOSIS — R5381 Other malaise: Secondary | ICD-10-CM

## 2012-01-13 DIAGNOSIS — R5383 Other fatigue: Secondary | ICD-10-CM

## 2012-01-13 LAB — TSH: TSH: 1.14 u[IU]/mL (ref 0.35–5.50)

## 2012-01-13 MED ORDER — TRIAMCINOLONE ACETONIDE(NASAL) 55 MCG/ACT NA INHA: 2.0000 | Freq: Every day | NASAL | Status: DC

## 2012-01-13 MED ORDER — AMOXICILLIN-POT CLAVULANATE 875-125 MG PO TABS: 1.0000 | ORAL_TABLET | Freq: Two times a day (BID) | ORAL | Status: DC

## 2012-01-13 MED ORDER — ZOLPIDEM TARTRATE 10 MG PO TABS: 10.0000 mg | ORAL_TABLET | Freq: Every day | ORAL | Status: DC

## 2012-01-13 NOTE — Telephone Encounter (Signed)
Caller: [place]/Patient; PCP: Adline Mango; CB#: [phone]; ; ; Call regarding Needs Diflucan;  Pt calling regarding she needs a Diflucan called in, was in office 01/13/12 and placed on abx, forgot to ask for this, no s/s at this time, wants called to [facility] [phone].

## 2012-01-13 NOTE — Telephone Encounter (Signed)
Fluconazole 150 mg times one dose. 

## 2012-01-13 NOTE — Progress Notes (Signed)
  Subjective:    Patient ID: [name], female    DOB: 04-22-1946, 66 y.o.   MRN: [id number]  HPI  Recurrent sinus infection. Patient seen in May and placed on Augmentin and felt that this resolved. She was seen in June with recurrent facial pain and headaches and treated with Ceftin. She feels that things have never resolved since then. She has some right ear fullness and right maxillary facial pain. Right-sided nasal congestion. No fever. Frequent headaches. No purulent secretions. She's had frequent sinusitis the past. No recent imaging. Denies any fever or chills. She has springtime allergies but no allergy symptoms recently.  Patient had subtotal thyroidectomy back in October. TSH in November was normal. Does have some generalized fatigue issues. We have discussed possible repeat TSH in 6-12 months to ensure stability. No major constipation  Review of Systems  Constitutional: Positive for fatigue.  HENT: Positive for congestion and sinus pressure. Negative for rhinorrhea.   Respiratory: Negative for cough, shortness of breath and wheezing.        Objective:   Physical Exam  Constitutional: She appears well-developed and well-nourished.  HENT:  Right Ear: External ear normal.  Left Ear: External ear normal.  Nose: Nose normal.  Neck: Neck supple. No thyromegaly present.  Cardiovascular: Normal rate and regular rhythm.   Pulmonary/Chest: Effort normal and breath sounds normal. No respiratory distress. She has no wheezes. She has no rales.  Musculoskeletal: She exhibits no edema.  Lymphadenopathy:    She has no cervical adenopathy.          Assessment & Plan:  #1 recurrent sinusitis. Nasacort AQ 2 sprays per nostril once daily. Limited CT maxillofacial-given multiple recurrence vs chronic/nonresolving. Refill Augmentin 875 mg twice a day for 10 days  #2 fatigue. Recheck TSH with history of subtotal thyroidectomy almost one year ago

## 2012-01-14 MED ORDER — FLUCONAZOLE 150 MG PO TABS: 150.0000 mg | ORAL_TABLET | Freq: Once | ORAL | Status: DC

## 2012-01-14 NOTE — Telephone Encounter (Signed)
done

## 2012-01-14 NOTE — Progress Notes (Signed)
Quick Note:  Pt informed on personally identified VM ______ 

## 2012-01-18 ENCOUNTER — Other Ambulatory Visit: Payer: BC Managed Care – PPO

## 2012-01-22 ENCOUNTER — Telehealth: Payer: Self-pay | Admitting: Family Medicine

## 2012-01-22 MED ORDER — FLUCONAZOLE 150 MG PO TABS: 150.0000 mg | ORAL_TABLET | Freq: Once | ORAL | Status: AC

## 2012-01-22 NOTE — Telephone Encounter (Signed)
I do not remember calling pt recently? Please advise

## 2012-01-22 NOTE — Telephone Encounter (Signed)
Patient returning call, requesting refill for Diflucan.

## 2012-01-22 NOTE — Telephone Encounter (Signed)
Diflucan was called in July 25. May refill once.

## 2012-03-25 ENCOUNTER — Encounter: Payer: Self-pay | Admitting: Family Medicine

## 2012-03-25 ENCOUNTER — Ambulatory Visit (INDEPENDENT_AMBULATORY_CARE_PROVIDER_SITE_OTHER): Payer: No Typology Code available for payment source | Admitting: Family Medicine

## 2012-03-25 VITALS — BP 140/82 | Temp 98.4°F | Wt 134.0 lb

## 2012-03-25 DIAGNOSIS — Z23 Encounter for immunization: Secondary | ICD-10-CM

## 2012-03-25 DIAGNOSIS — I8390 Asymptomatic varicose veins of unspecified lower extremity: Secondary | ICD-10-CM

## 2012-03-25 NOTE — Progress Notes (Signed)
  Subjective:    Patient ID: [name], female    DOB: Feb 08, 1946, 66 y.o.   MRN: [id number]  HPI  Patient seen with concerns for inflammation of some veins in her right lower extremity. No history of DVT. No real pain. No evidence for peripheral edema in her foot, ankle, or lower leg. She's not noticed any color changes or increased warmth right lower extremity. No recent injury. No pain with ambulation. She's had some previous varicose veins but slightly increased prominence recently. No dyspnea. No chest pains. No pleuritic pain. Not had flu vaccine.   Review of Systems  Respiratory: Negative for shortness of breath.   Cardiovascular: Negative for chest pain.  Musculoskeletal: Negative for gait problem.       Objective:   Physical Exam  Constitutional: She appears well-developed and well-nourished.  Cardiovascular: Normal rate and regular rhythm.  Exam reveals no gallop.   Pulmonary/Chest: Effort normal and breath sounds normal. No respiratory distress. She has no wheezes. She has no rales.  Musculoskeletal:       No evidence for lower extremity edema. She has some varicose veins which are slightly prominent right calf and distal thigh region but these are nontender to palpation. She denies any calf or thigh tenderness. No warmth. No color changes. No asymmetric edema          Assessment & Plan:  Varicose veins. Clinically no evidence for DVT. Followup promptly for any asymmetric edema, color changes, or pain. Flu vaccine is given after obtaining consent

## 2012-03-25 NOTE — Patient Instructions (Addendum)
Follow up for any asymmetric leg edema or progressive swelling

## 2012-04-28 ENCOUNTER — Other Ambulatory Visit: Payer: Self-pay | Admitting: Dermatology

## 2012-05-09 ENCOUNTER — Encounter: Payer: Self-pay | Admitting: Family Medicine

## 2012-05-09 ENCOUNTER — Ambulatory Visit (INDEPENDENT_AMBULATORY_CARE_PROVIDER_SITE_OTHER): Payer: No Typology Code available for payment source | Admitting: Family Medicine

## 2012-05-09 VITALS — BP 120/70 | Temp 98.0°F | Wt 136.0 lb

## 2012-05-09 DIAGNOSIS — J329 Chronic sinusitis, unspecified: Secondary | ICD-10-CM

## 2012-05-09 MED ORDER — CEFUROXIME AXETIL 250 MG PO TABS: 250.0000 mg | ORAL_TABLET | Freq: Two times a day (BID) | ORAL | Status: DC

## 2012-05-09 NOTE — Progress Notes (Signed)
Subjective:     Patient ID: [name], female   DOB: 31-May-1946, 66 y.o.   MRN: [id number]  HPI  66 year old with history of allergic rhinitis here for evaluation of congestion, sore throat and neck soreness.  She states that her symptoms started about 9 days ago with what she thinks was a cold, but in the last 4 days she has developed maxillary facial and dental pain bilaterally.  She notes a feeling of fullness in her head, but denies ear pain, loss of hearing, fever, SOB, or CP.  Nasacort has been unhelpful in resolving her symptoms, otherwise has not tried any remedies.  Of note, she has a history of sinus issues, and earlier this summer and spring was on several courses of Augmentin and Ceftin, and had a maxillofacial CT scheduled in late July, but has been unable to have this done so far.  Review of Systems  Constitutional: Negative for fever, chills and diaphoresis.  HENT: Positive for congestion, sore throat, rhinorrhea, neck pain, dental problem (maxillary dental pain), postnasal drip and sinus pressure. Negative for hearing loss, ear pain, trouble swallowing, tinnitus and ear discharge.   Eyes: Negative for discharge and visual disturbance.  Respiratory: Negative for chest tightness and shortness of breath.   Cardiovascular: Negative for chest pain.       Objective:   Physical Exam  Constitutional: She is oriented to person, place, and time. She appears well-developed and well-nourished. No distress.  HENT:  Head: Normocephalic and atraumatic.  Right Ear: External ear normal.  Left Ear: External ear normal.  Mouth/Throat: Oropharynx is clear and moist. No oropharyngeal exudate.       Nasal mucosa moist, slightly erythematous, but without appreciable swelling. TMs clear bilaterally.  Neck: Normal range of motion. Neck supple.  Cardiovascular: Normal rate, regular rhythm and normal heart sounds.   Pulmonary/Chest: Effort normal and breath sounds normal. No respiratory  distress.  Lymphadenopathy:    She has no cervical adenopathy.  Neurological: She is alert and oriented to person, place, and time.  Skin: Skin is warm and dry.       Assessment:     66 year old here for evaluation of congestion, sore throat, and neck soreness.    Plan:     1. Congestion/sore throat: concern for developing bacterial process.  Given pt's history of sinus issues, and improvement while on abx in the past.  Also, current duration of symptoms without improvement, and development of maxillary facial and dental pain suggest possible bacterial process; no relief from Nasacort.  Rx Ceftin 250mg  BID x10 days.  Follow up if symptoms do not improve, or they worsen.  At that point, it may be appropriate to consider further workup of sinus issues with imaging, to assess for anatomical problems, polyps, etc.  Marthann Schiller, MS3     Agree with assessment and plan as per Marthann Schiller, MS 3 Evelena Peat MD

## 2012-06-01 ENCOUNTER — Ambulatory Visit (INDEPENDENT_AMBULATORY_CARE_PROVIDER_SITE_OTHER): Payer: No Typology Code available for payment source | Admitting: Women's Health

## 2012-06-01 ENCOUNTER — Encounter: Payer: Self-pay | Admitting: Women's Health

## 2012-06-01 VITALS — BP 132/86 | Ht 68.0 in | Wt 136.0 lb

## 2012-06-01 DIAGNOSIS — Z01419 Encounter for gynecological examination (general) (routine) without abnormal findings: Secondary | ICD-10-CM

## 2012-06-01 NOTE — Patient Instructions (Addendum)

## 2012-06-01 NOTE — Progress Notes (Signed)
[name] [birth year] [id number]    History:    The patient presents for annual exam.  TAH with BSO for fibroids 1980. Negative colonoscopy 2008. Normal Pap and mammogram history. Normal DEXA several years ago per patient. 2 [place] in 2011. History of stable left lower lobe lung nodules-primary care. History of MVP with murmur no medications.   Past medical history, past surgical history, family history and social history were all reviewed and documented in the EPIC chart. Retired, plays tennis, has active lifestyle.   ROS:  A  ROS was performed and pertinent positives and negatives are included in the history.  Exam:  Filed Vitals:   06/01/12 1436  BP: 132/86    General appearance:  Normal Head/Neck:  Normal, without cervical or supraclavicular adenopathy. Thyroid:  Symmetrical, normal in size, without palpable masses or nodularity. Respiratory  Effort:  Normal  Auscultation:  Clear without wheezing or rhonchi Cardiovascular  Auscultation:  Regular rate, grade 2 murmurs   Edema/varicosities:  Not grossly evident Abdominal  Soft,nontender, without masses, guarding or rebound.  Liver/spleen:  No organomegaly noted  Hernia:  None appreciated  Skin  Inspection:  Grossly normal  Palpation:  Grossly normal Neurologic/psychiatric  Orientation:  Normal with appropriate conversation.  Mood/affect:  Normal  Genitourinary    Breasts: Examined lying and sitting.     Right: Without masses, retractions, discharge or axillary adenopathy.     Left: Without masses, retractions, discharge or axillary adenopathy.   Inguinal/mons:  Normal without inguinal adenopathy  External genitalia:  Normal  BUS/Urethra/Skene's glands:  Normal  Bladder:  Normal  Vagina:  Atrophic   Cervix:  Absent  Uterus:  Absent  Adnexa/parametria:     Rt: Without masses or tenderness.   Lt: Without masses or tenderness.  Anus and perineum: Normal  Digital rectal exam: Normal sphincter tone without  palpated masses or tenderness  Assessment/Plan:  66 y.o. M. WF G3 P3 for annual exam without complaint.  TAH with BSO/fibroids no ERT History of left lower lung nodule-primary care MVP-no meds   Plan: SBE's, continue annual mammogram, calcium rich diet, vitamin D 2000 daily encouraged. Continue active lifestyle- tennis, exercise. Home safety and fall prevention discussed. Repeat DEXA. Will get scheduled here last one [place]. Labs at primary care. Normal Pap 2012, reviewed new screening guidelines. Encouraged vaginal lubricants with intercourse, states did not tolerate Vagifem.   [name] [facility], 6:02 PM 06/01/2012

## 2012-07-05 ENCOUNTER — Other Ambulatory Visit: Payer: Self-pay | Admitting: Family Medicine

## 2012-07-06 NOTE — Telephone Encounter (Signed)
Refill for 6 months. 

## 2012-07-06 NOTE — Telephone Encounter (Signed)
Last filled 01-13-12, #30 with 5 refills

## 2012-08-09 ENCOUNTER — Other Ambulatory Visit: Payer: Self-pay | Admitting: Family Medicine

## 2012-08-11 NOTE — Telephone Encounter (Signed)
Alprazolam for flying last filled 03-20-11, #30 with 0 refills

## 2012-08-11 NOTE — Telephone Encounter (Signed)
Refill once 

## 2012-08-31 ENCOUNTER — Telehealth: Payer: Self-pay | Admitting: Internal Medicine

## 2012-08-31 ENCOUNTER — Ambulatory Visit (INDEPENDENT_AMBULATORY_CARE_PROVIDER_SITE_OTHER)
Admission: RE | Admit: 2012-08-31 | Discharge: 2012-08-31 | Disposition: A | Discharge status: Home / Self Care | Payer: BC Managed Care – PPO | Source: Ambulatory Visit | Attending: Internal Medicine | Admitting: Internal Medicine

## 2012-08-31 NOTE — Telephone Encounter (Signed)
Ct Chest Wo Contrast  08/31/2012  *RADIOLOGY REPORT*  Clinical Data: Follow-up pulmonary nodules.  No history of malignancy. Former smoker (20 pack year history).  CT CHEST WITHOUT CONTRAST  Technique:  Multidetector CT imaging of the chest was performed following the standard protocol without IV contrast.  Comparison: Chest CTs ranging from 10/25/2010 through 11/24/2011. PET CT 12/12/2010.  Findings: The two lobulated left lower lobe pulmonary nodules have not significantly changed from the original study.  The more superior and anterior lesion measures 1.5 x 1.1 cm transverse on image 32.  The more posterior and inferior lesion measures 9 x 12 mm on image 39 (and up to 17 mm cephalocaudad on the coronal image 86).  No new nodules are identified.  Mild emphysematous changes are stable.  There are stable surgical clips at the thoracic inlet on the right suggesting prior right thyroid resection.  There is stable atherosclerosis of the aorta, great vessels and coronary arteries. As evaluated in the noncontrast state, the mediastinum appears stable without adenopathy.  There is no pleural or pericardial effusion.  The visualized upper abdomen has a stable appearance with a partially imaged cyst in the left kidney.  There are no worrisome osseous findings.  IMPRESSION:  1.  The left lower lobe pulmonary nodules are unchanged from 10/25/2010. The stability over 22 months is highly supportive of a benign etiology.  However, given the risk factors for malignancy and the morphology of these lesions, an additional follow-up study in 1 year should be considered. 2.  No acute findings. 3.  Stable atherosclerosis.   Original Report Authenticated By: Carey Bullocks, M.D.    Let Anson Crofts know that CT scan of the chest shows no change in left lower lobe pulmonary nodule compared to May 2012. This means there is no change in lung nodule and 22 months. This means highly likely benign. Nevertheless, radiologist recommending  one more scan in 1 year time because some atypical cancers particularly in women can grow really slowly. I have ordered one. We will call her with the results in one year from now before we decide whether she needs to come in or not.  Also, let RAYMONDA PELL  know that there is a new  blood test available which can potentially help [place] tell if these nodules are benign. The test is currently free. This will add to the confidence level if these nodules are benign just as we think. The test is done by company called [employer] and the test is called XPRESYS.  If so, please send the message back to me so I can coordinate with lung cancer navigator Willette Pa   Dr. Kalman Shan, M.D., [facility].C.P Pulmonary and Critical Care Medicine Staff Physician [place] [facility]  Pulmonary and Critical Care Pager: [phone], If no answer or between  15:00h - 7:00h: call [phone]  08/31/2012 10:53 PM

## 2012-09-06 ENCOUNTER — Telehealth: Payer: Self-pay | Admitting: Internal Medicine

## 2012-09-06 NOTE — Telephone Encounter (Signed)
I spoke with pt. She is requesting her CT results. I advised pt will forward the message to MR. Please advise thanks

## 2012-09-06 NOTE — Telephone Encounter (Signed)
Look at my telephone message dated 08/31/12 sent to Carron Curie for which no action taken yet

## 2012-09-07 NOTE — Telephone Encounter (Signed)
Pt called triage's.  [name]

## 2012-09-07 NOTE — Telephone Encounter (Signed)
See phone note 09/06/12

## 2012-09-07 NOTE — Telephone Encounter (Signed)
Let [name] know that CT scan of the chest shows no change in left lower lobe pulmonary nodule compared to May 2012. This means there is no change in lung nodule and 22 months. This means highly likely benign. Nevertheless, radiologist recommending one more scan in 1 year time because some atypical cancers particularly in women can grow really slowly. I have ordered one. We will call her with the results in one year from now before we decide whether she needs to come in or not.   Also, let [name] know that there is a new blood test available which can potentially help [place] tell if these nodules are benign. The test is currently free. This will add to the confidence level if these nodules are benign just as we think. The test is done by company called [employer] and the test is called XPRESYS. If so, please send the message back to me so I can coordinate with lung cancer navigator Willette Pa  ----  lmomtcb x1

## 2012-09-07 NOTE — Telephone Encounter (Signed)
Patient aware of results per MR. Patient is interested in having this blood test through [employer]PRES[facility] done.   Please advise MR, thanks.

## 2012-09-07 NOTE — Telephone Encounter (Signed)
Pt is returning triage's call.  Pt states she will be available after 3:00 pm today.  [name]

## 2012-09-07 NOTE — Telephone Encounter (Signed)
lmtcb x1 w. Family member 

## 2012-09-08 ENCOUNTER — Telehealth: Payer: Self-pay | Admitting: *Deleted

## 2012-09-08 NOTE — Telephone Encounter (Signed)
Patient to call Willette Pa [phone] to set up the process for the [employer] blood test. WILL CALL IF ANY QUESTIONS.

## 2012-09-08 NOTE — Telephone Encounter (Signed)
Please let her know that she should get a call probably from Willette Pa next few days who will go through the process with her. He takes 3 weeks for the results from the time of the blood draw. Depending on the results I will call her to explain the results  Thanks   MR

## 2012-09-08 NOTE — Telephone Encounter (Signed)
Spoke with pt husband regarding [employer] blood test.  I stated a rep will be calling to give them more information.

## 2012-09-12 ENCOUNTER — Telehealth: Payer: Self-pay | Admitting: Internal Medicine

## 2012-09-12 NOTE — Telephone Encounter (Signed)
Is this test billed to the pt insurance? Carron Curie, CMA

## 2012-09-13 NOTE — Telephone Encounter (Signed)
I spoke with the pt spouse and he states that he has spoken with a rep from [place] and that he answered his questions and the pt is set to have lab done today. Nothing further needed. Carron Curie, CMA

## 2012-09-13 NOTE — Telephone Encounter (Signed)
Has Willette Pa called her yet ? IF [employer] insurance yes but otherwise because is new they arenot charging and is free. Willette Pa should be able to explain this to her.   Thanks  MR

## 2012-09-27 ENCOUNTER — Telehealth: Payer: Self-pay | Admitting: Cardiology

## 2012-09-27 ENCOUNTER — Ambulatory Visit (INDEPENDENT_AMBULATORY_CARE_PROVIDER_SITE_OTHER): Payer: No Typology Code available for payment source | Admitting: Cardiovascular Disease

## 2012-09-27 ENCOUNTER — Ambulatory Visit (INDEPENDENT_AMBULATORY_CARE_PROVIDER_SITE_OTHER): Payer: No Typology Code available for payment source | Admitting: Internal Medicine

## 2012-09-27 ENCOUNTER — Encounter: Payer: Self-pay | Admitting: Cardiovascular Disease

## 2012-09-27 VITALS — BP 114/86 | HR 66 | Ht 69.0 in | Wt 135.0 lb

## 2012-09-27 VITALS — BP 122/84 | HR 72 | Temp 98.3°F | Wt 136.0 lb

## 2012-09-27 DIAGNOSIS — R000 Tachycardia, unspecified: Secondary | ICD-10-CM

## 2012-09-27 DIAGNOSIS — I341 Nonrheumatic mitral (valve) prolapse: Secondary | ICD-10-CM

## 2012-09-27 DIAGNOSIS — J309 Allergic rhinitis, unspecified: Secondary | ICD-10-CM

## 2012-09-27 DIAGNOSIS — I059 Rheumatic mitral valve disease, unspecified: Secondary | ICD-10-CM

## 2012-09-27 DIAGNOSIS — R0602 Shortness of breath: Secondary | ICD-10-CM

## 2012-09-27 DIAGNOSIS — J302 Other seasonal allergic rhinitis: Secondary | ICD-10-CM

## 2012-09-27 DIAGNOSIS — I340 Nonrheumatic mitral (valve) insufficiency: Secondary | ICD-10-CM

## 2012-09-27 DIAGNOSIS — R002 Palpitations: Secondary | ICD-10-CM

## 2012-09-27 LAB — BASIC METABOLIC PANEL
CO2: 25 mEq/L (ref 19–32)
Glucose, Bld: 80 mg/dL (ref 70–99)
Potassium: 4.2 mEq/L (ref 3.5–5.1)
Sodium: 139 mEq/L (ref 135–145)

## 2012-09-27 LAB — CBC WITH DIFFERENTIAL/PLATELET
Basophils Absolute: 0.1 10*3/uL (ref 0.0–0.1)
Eosinophils Absolute: 0.1 10*3/uL (ref 0.0–0.7)
HCT: 41.3 % (ref 36.0–46.0)
Hemoglobin: 14.1 g/dL (ref 12.0–15.0)
Lymphs Abs: 1.6 10*3/uL (ref 0.7–4.0)
MCHC: 34.1 g/dL (ref 30.0–36.0)
Monocytes Absolute: 0.4 10*3/uL (ref 0.1–1.0)
Neutro Abs: 2.1 10*3/uL (ref 1.4–7.7)
RDW: 13.7 % (ref 11.5–14.6)

## 2012-09-27 MED ORDER — MOMETASONE FUROATE 50 MCG/ACT NA SUSP: 2.0000 | Freq: Every day | NASAL | Status: DC

## 2012-09-27 MED ORDER — METOPROLOL TARTRATE 25 MG PO TABS: 12.5000 mg | ORAL_TABLET | Freq: Two times a day (BID) | ORAL | Status: DC

## 2012-09-27 NOTE — Assessment & Plan Note (Signed)
I doubt patient's sinus complaints secondary to bacterial sinusitis. Patient advised to use intranasal saline and Nasonex for allergic rhinitis. Patient advised to call office if symptoms persist or worsen.

## 2012-09-27 NOTE — Telephone Encounter (Signed)
New Problem:    Patient's husband called in because his wife is having mitral valve issues and would like her to be seen by Dr. Shirlee Latch soon.  Please call back.

## 2012-09-27 NOTE — Telephone Encounter (Signed)
Pt was added on to see Dr Excell Seltzer today.

## 2012-09-27 NOTE — Progress Notes (Signed)
HPI:   67 year-old [name] presenting for evaluation as an add-on. She saw Dr. Artist Pais this afternoon and complained of chest pain and palpitations. She has been followed for bileaflet mitral valve prolapse of mitral regurgitation. She's undergone cardiac catheterization in 2012 that showed minor nonobstructive coronary disease. She also underwent a right heart catheterization that showed normal intracardiac pressures.  The patient complains of feeling poorly over the last 2-3 days. She feels like she's had sinus congestion and fatigue. She notes increased palpitations. She denies chest pain or pressure. She has chronic exertional dyspnea, a little worse over the past few days. She denies orthopnea, PND, or leg swelling. She's had no fevers, chills, or cough.  Outpatient Encounter Prescriptions as of 09/27/2012  Medication Sig Dispense Refill  . ALPRAZolam (XANAX) 0.5 MG tablet TAKE 1 TABLET BY MOUTH EVERY 8 HOURS AS NEEDED FOR ANXIETY  30 tablet  0  . aspirin 81 MG tablet Take 81 mg by mouth daily.        . metoprolol tartrate (LOPRESSOR) 25 MG tablet Take 0.5 tablets (12.5 mg total) by mouth 2 (two) times daily.  30 tablet  1  . mometasone (NASONEX) 50 MCG/ACT nasal spray Place 2 sprays into the nose daily.  17 g  3  . zolpidem (AMBIEN) 10 MG tablet TAKE 1 TABLET BY MOUTH AT BEDTIME  30 tablet  5   No facility-administered encounter medications on file as of 09/27/2012.    Allergies  Allergen Reactions  . Inderal (Propranolol Hcl)     SEVERE LIP AND SKIN DRYNESS    Past Medical History  Diagnosis Date  . ANXIETY 02/19/2009  . INSOMNIA, CHRONIC 03/29/2009  . Migraine   . Heart murmur   . Skin cancer of arm     Squamous  . MVP (mitral valve prolapse)     NO MEDS  . Thyroid disease     ROS: Negative except as per HPI  BP 114/86  Pulse 66  Ht 5\' 9"  (1.753 m)  Wt 135 lb (61.236 kg)  BMI 19.93 kg/m2  SpO2 94%  LMP 06/22/1977  PHYSICAL EXAM: Pt is alert and oriented, NAD HEENT:  normal Neck: JVP - normal, carotids 2+= without bruits Lungs: CTA bilaterally CV: RRR with grade 3/6 mid-to-late systolic murmur at the apex and left parascapular region. Murmur lengthens with standing. Abd: soft, NT, Positive BS, no hepatomegaly Ext: no C/C/E, distal pulses intact and equal Skin: warm/dry no rash  EKG:  Sinus rhythm 66 beats per minute, U waves are present, otherwise within normal limits.  ASSESSMENT AND PLAN: 1. Mitral valve prolapse or mitral regurgitation. Her exam is consistent with at least moderate mitral regurgitation. I reviewed her previous echocardiogram from 2012. I've recommended a repeat study considering her recent symptoms. I suspect her mitral regurgitation will be stable on her followup study, but we should pursue a repeat exam with a change in her symptoms.  2. Shortness of breath and palpitations. Previous studies reviewed and these included an essentially unremarkable cardiac catheterization. She's also had a Holter monitor that showed no significant abnormalities. Other than an echocardiogram, I don't think further testing is indicated at this time.  For followup, will arrange an appointment with Dr. Shirlee Latch to review her echo. We discussed consideration of a beta blocker at low dose, but she declines at this time. I asked her to cut down on caffeine and alcohol, which she drinks in moderation (2 glasses of wine and 2-3 cups of coffee). She  will push fluids as well. There are no signs of heart failure on exam.  Tonny Bollman 09/27/2012 5:39 PM

## 2012-09-27 NOTE — Patient Instructions (Addendum)
Do not start Metoprolol.  Your physician has requested that you have an echocardiogram. Echocardiography is a painless test that uses sound waves to create images of your heart. It provides your doctor with information about the size and shape of your heart and how well your heart's chambers and valves are working. This procedure takes approximately one hour. There are no restrictions for this procedure.  Your physician recommends that you schedule a follow-up appointment in: 6 WEEKS with Dr Shirlee Latch

## 2012-09-27 NOTE — Assessment & Plan Note (Signed)
67 year old white female having recurrence of palpitations. Previous evaluation showed PACs. She could not tolerate propranolol due to side effect. Trial of low-dose metoprolol. Patient having associated chest pain or shortness of breath. Arrange followup with cardiology within one week. She has fairly loud systolic ejection murmur left sternal border. Repeat echocardiogram.  Patient advised decreased caffeine and  alcohol use. Check CBCD, BMET and thyroid studies.

## 2012-09-27 NOTE — Progress Notes (Signed)
Subjective:    Patient ID: [name], female    DOB: 09/01/1945, 67 y.o.   MRN: [id number]  HPI  67 year old white female with history of mitral valve prolapse complains of intermittent palpitations. She's had similar symptoms in the past (1-2 years ago)and was seen by her cardiologist. She reports her workup included Holter monitor, echocardiogram and cardiac cath. Patient reports cardiac cath was negative for obstructive coronary artery disease.  Patient was prescribed low dose Inderal but had to discontinue due to side effect of sores on her lips.  Palpitations feel like "irregular thumping" in her chest. She has associated chest pain and mild shortness of breath.  Patient also has been more fatigued than usual over last week.  She also has history of chronic sinus issues. She complains of bilateral sinus congestion. She denies any purulent nasal discharge. No fever chills  Review of Systems See HPI She is nonsmoker, she drinks 2 cups of coffee per day She drinks 2 glasses of wine per day    Past Medical History  Diagnosis Date  . ANXIETY 02/19/2009  . INSOMNIA, CHRONIC 03/29/2009  . Migraine   . Heart murmur   . Skin cancer of arm     Squamous  . MVP (mitral valve prolapse)     NO MEDS  . Thyroid disease     History   Social History  . Marital Status: Married    Spouse Name: N/A    Number of Children: N/A  . Years of Education: N/A   Occupational History  . retired    Social History Main Topics  . Smoking status: Former Smoker -- 1.00 packs/day for 20 years    Types: Cigarettes    Quit date: 08/11/1970  . Smokeless tobacco: Never Used  . Alcohol Use: 4.2 oz/week    7 Glasses of wine per week  . Drug Use: No  . Sexually Active: Yes    Birth Control/ Protection: Surgical   Other Topics Concern  . Not on file   Social History Narrative  . No narrative on file    Past Surgical History  Procedure Laterality Date  . Appendectomy  1969  . Tonsillectomy   1980  . Abdominal hysterectomy  1980    WITH BSO/FIBROIDS  . Oophorectomy      BSO WITH ABDOMINAL HYSTERECTOMY  . Thyroid cyst excision  05-01-11    RIGHT    Family History  Problem Relation Age of Onset  . Diabetes Mother     type ll  . Alcohol abuse Father   . Cancer Father     THROAT  . Cancer Maternal Aunt     ovarian  . Breast cancer Maternal Grandmother     Allergies  Allergen Reactions  . Inderal (Propranolol Hcl)     SEVERE LIP AND SKIN DRYNESS    Current Outpatient Prescriptions on File Prior to Visit  Medication Sig Dispense Refill  . ALPRAZolam (XANAX) 0.5 MG tablet TAKE 1 TABLET BY MOUTH EVERY 8 HOURS AS NEEDED FOR ANXIETY  30 tablet  0  . aspirin 81 MG tablet Take 81 mg by mouth daily.        Marland Kitchen zolpidem (AMBIEN) 10 MG tablet TAKE 1 TABLET BY MOUTH AT BEDTIME  30 tablet  5   No current facility-administered medications on file prior to visit.    BP 122/84  Pulse 72  Temp(Src) 98.3 F (36.8 C) (Oral)  Wt 136 lb (61.689 kg)  BMI 20.68 kg/m2  LMP 06/22/1977  EKG shows normal sinus rhythm at 66 beats per minute with occasional PACs  Objective:   Physical Exam  Constitutional: She is oriented to person, place, and time. She appears well-developed and well-nourished.  HENT:  Head: Normocephalic and atraumatic.  Right Ear: External ear normal.  Left Ear: External ear normal.  Mouth/Throat: Oropharynx is clear and moist.  Neck: Neck supple.  No carotid bruit  Cardiovascular: Normal rate and regular rhythm.   Murmur heard. Systolic ejection murmur 2/6 left sternal border accentuated by left hand grip  Pulmonary/Chest: Effort normal and breath sounds normal. She has no wheezes.  Neurological: She is alert and oriented to person, place, and time. No cranial nerve deficit.  Psychiatric: She has a normal mood and affect. Her behavior is normal.          Assessment & Plan:

## 2012-10-02 ENCOUNTER — Telehealth: Payer: Self-pay | Admitting: Internal Medicine

## 2012-10-02 DIAGNOSIS — R911 Solitary pulmonary nodule: Secondary | ICD-10-CM

## 2012-10-02 NOTE — Telephone Encounter (Signed)
xpresys protein chemistry lung cancer blood test done on 09/13/2012 is indeterminate.  I will need to call her with results  Dr. Kalman Shan, M.D., [facility].C.P Pulmonary and Critical Care Medicine Staff Physician [facility] [place] Pulmonary and Critical Care Pager: [phone], If no answer or between  15:00h - 7:00h: call [phone]  10/02/2012 2:46 PM

## 2012-10-04 ENCOUNTER — Ambulatory Visit (HOSPITAL_COMMUNITY): Payer: BC Managed Care – PPO | Attending: Cardiology | Admitting: Radiology

## 2012-10-04 DIAGNOSIS — I340 Nonrheumatic mitral (valve) insufficiency: Secondary | ICD-10-CM

## 2012-10-04 DIAGNOSIS — I341 Nonrheumatic mitral (valve) prolapse: Secondary | ICD-10-CM

## 2012-10-04 DIAGNOSIS — I059 Rheumatic mitral valve disease, unspecified: Secondary | ICD-10-CM | POA: Insufficient documentation

## 2012-10-04 NOTE — Progress Notes (Signed)
Echocardiogram performed.  

## 2012-10-05 NOTE — Telephone Encounter (Signed)
Pt returning call can be reached at [phone].[name]

## 2012-10-05 NOTE — Telephone Encounter (Signed)
Pt is aware of results. She is wanting to speak to MR in more detail about this. I advised her that is he on vacation this week. She states that he can call her once he gets back.  I will forward this message to let MR know that the pt would like to speak to him about this.

## 2012-10-06 ENCOUNTER — Telehealth: Payer: Self-pay | Admitting: Family Medicine

## 2012-10-06 ENCOUNTER — Telehealth: Payer: Self-pay | Admitting: *Deleted

## 2012-10-06 NOTE — Telephone Encounter (Signed)
Follow Up    Pt called in returning phone call about results. Please call.

## 2012-10-06 NOTE — Telephone Encounter (Signed)
Pt has sores in the corners of her mouth. Pt's dentist recommend she take B12 and B6. But also recommend she ask her PCP the amount or dose she should take.  Pls advise.

## 2012-10-06 NOTE — Telephone Encounter (Signed)
Message copied by Tarri Fuller on Thu Oct 06, 2012  9:41 AM ------      Message from: Laurey Morale      Created: Tue Oct 04, 2012 10:42 PM       Moderate MR with MV prolapse. Normal EF.  Please inform patient, she should also get followup. ------

## 2012-10-06 NOTE — Telephone Encounter (Signed)
Pt informed and she voiced her understanding 

## 2012-10-06 NOTE — Telephone Encounter (Signed)
i would consider MVI with B12 and B6.  B12 frequently 500 mg.  I am not, however, sure of any evidence that this will help her mouth sores.  If they persist let us see and we can try something

## 2012-10-06 NOTE — Telephone Encounter (Signed)
lmptcb to go over echo results  

## 2012-10-06 NOTE — Telephone Encounter (Signed)
pt notified about echo results with verbal understanding today, advised to keep appt with Dr. Shirlee Latch in May.  pt said ok and thank you.

## 2012-10-06 NOTE — Telephone Encounter (Signed)
pt notified about echo results with verbal understanding today, advised to keep appt with Dr. McLean in May.  pt said ok and thank you. 

## 2012-10-08 IMAGING — CT CT CHEST SUPER D W/O CM
2 of 4 series · 15 of 36 positions shown, 18 images · non-contrast
Comparison: 10/25/2010

CLINICAL DATA: Pulmonary nodule.

CT CHEST WITHOUT  CONTRAST
TECHNIQUE: Multidetector CT imaging of the chest was performed
using thin slice collimation for electromagnetic bronchoscopy
planning purposes, without intravenous contrast.

[Series 2: chest w/o st · axial · non-contrast · 0.64mm/px · z∈[-309,-34]mm · 12 of 67 slices shown, 15 images]
[im 6/67  mediastinal]
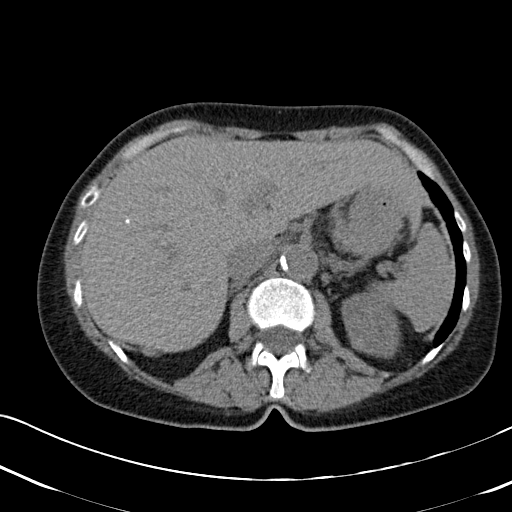
[im 6/67  lung]
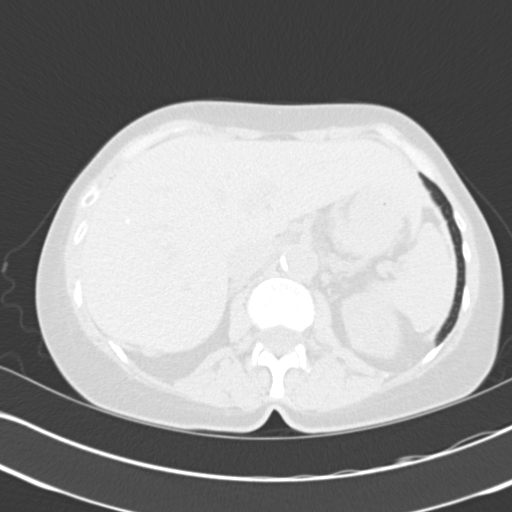
[im 11/67  lung]
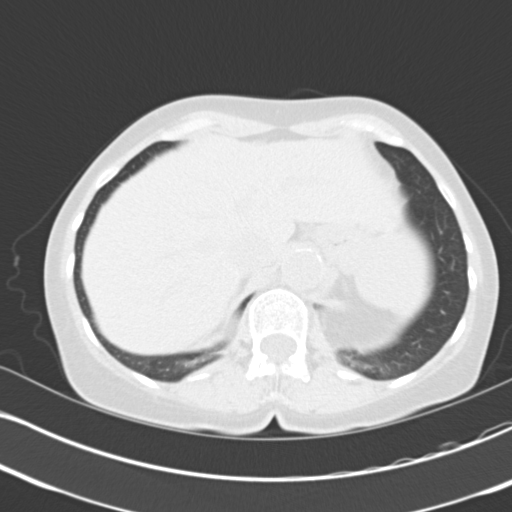
[im 16/67  lung]
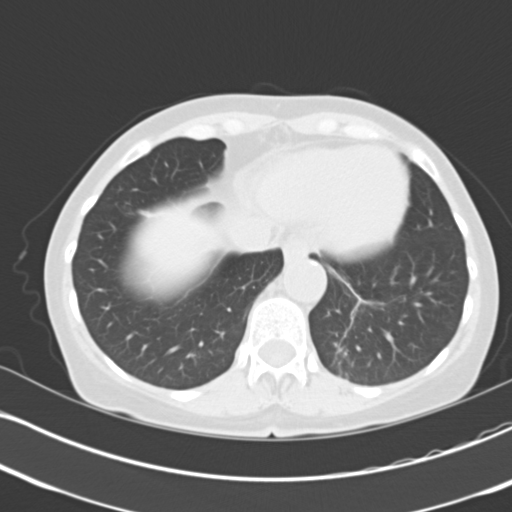
[im 21/67  lung]
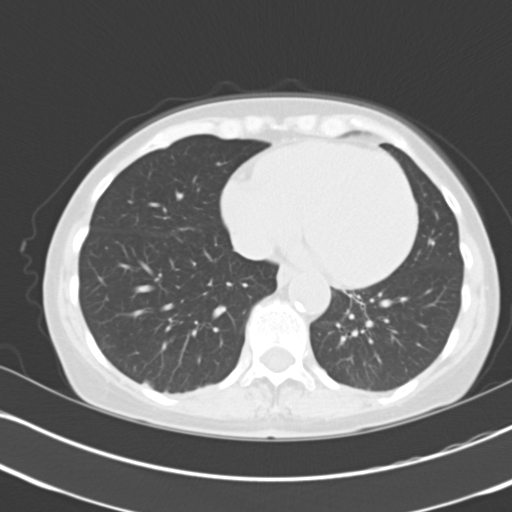
[im 26/67  mediastinal]
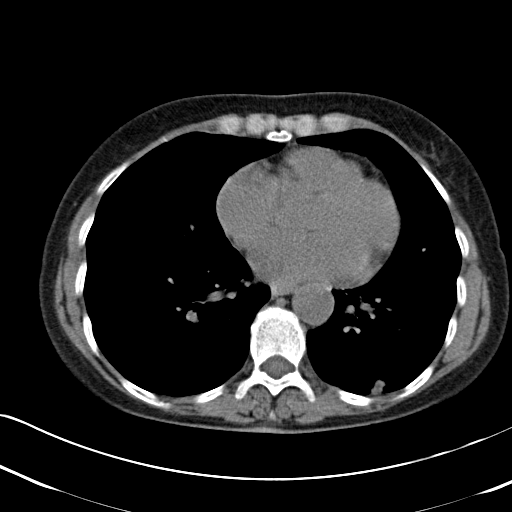
[im 26/67  lung]
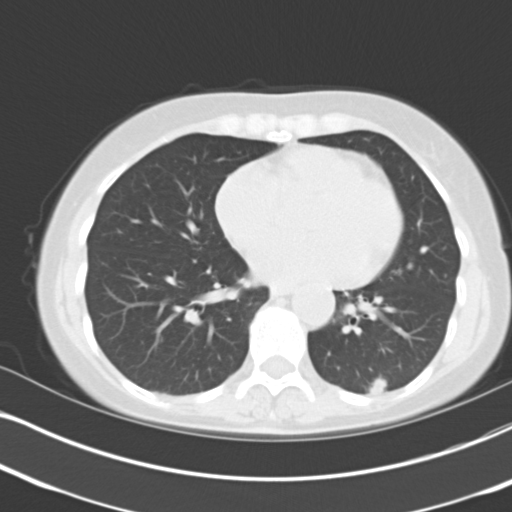
[im 31/67  lung]
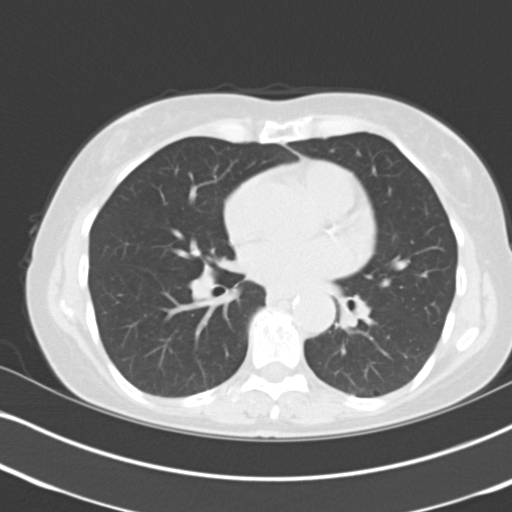
[im 36/67  lung]
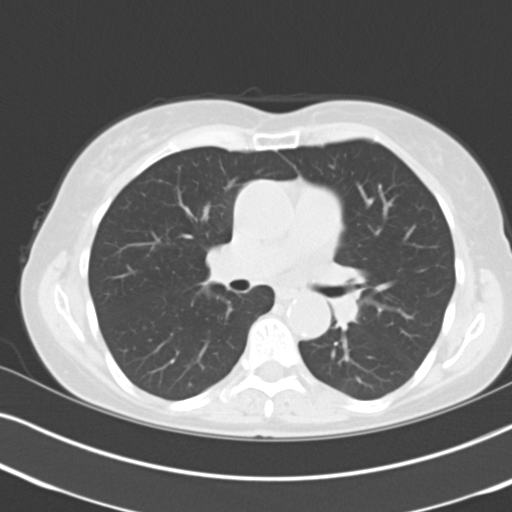
[im 41/67  lung]
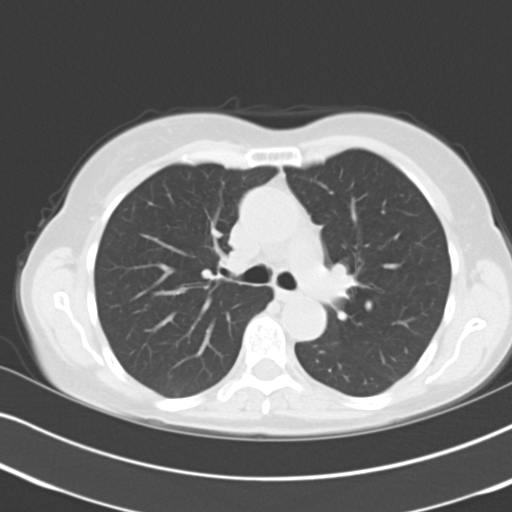
[im 46/67  mediastinal]
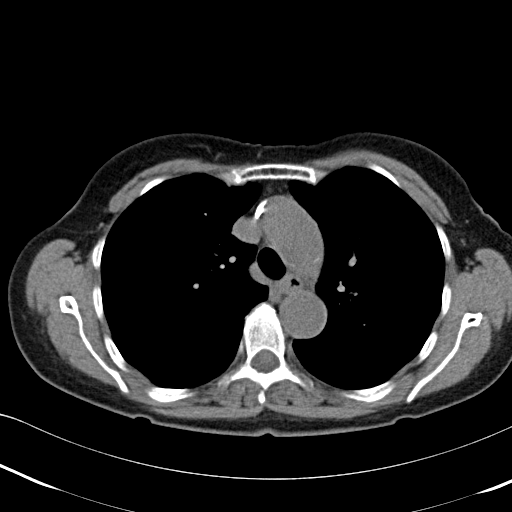
[im 46/67  lung]
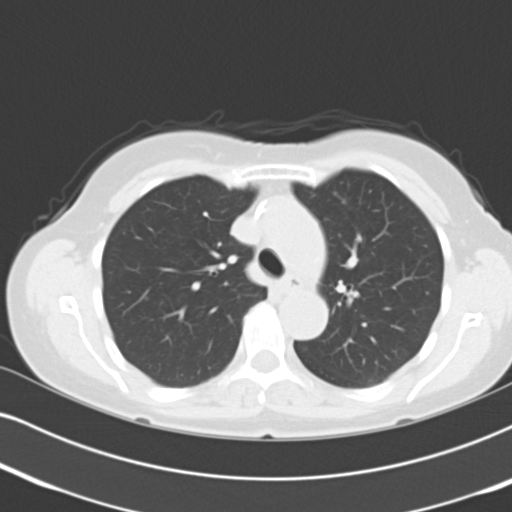
[im 51/67  lung]
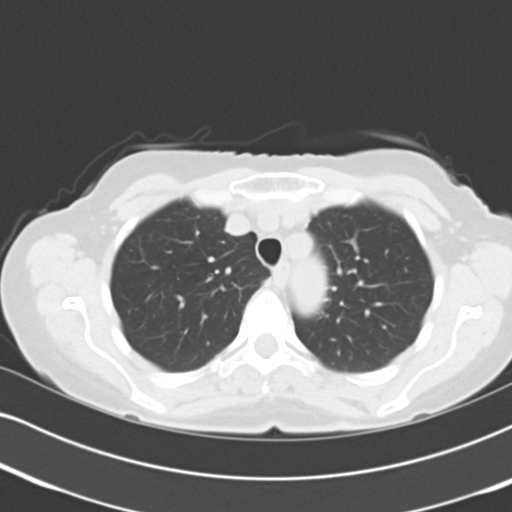
[im 56/67  lung]
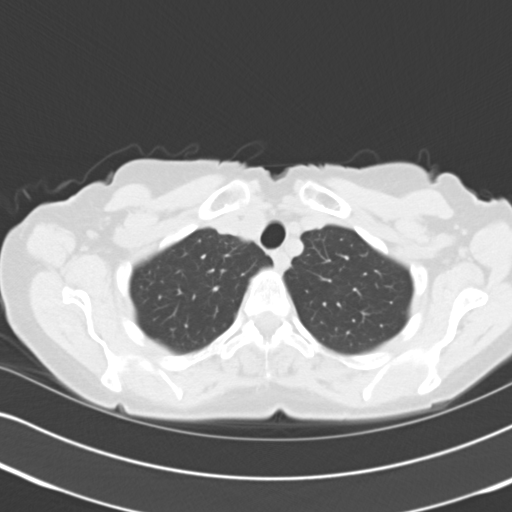
[im 61/67  lung]
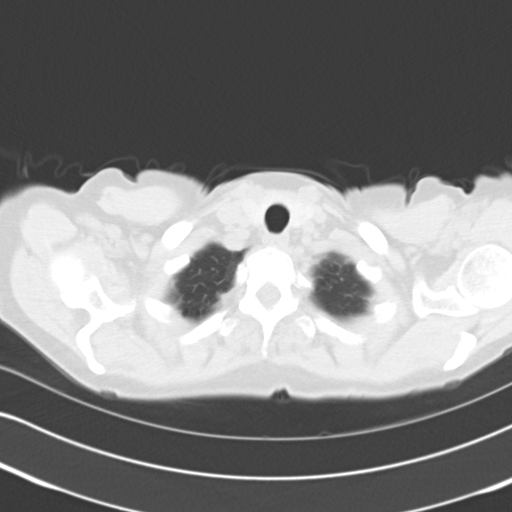

[Series 602: <mpr thick range> · coronal · 0.65mm/px · 3 of 71 slices shown]
[im 15/71  lung]
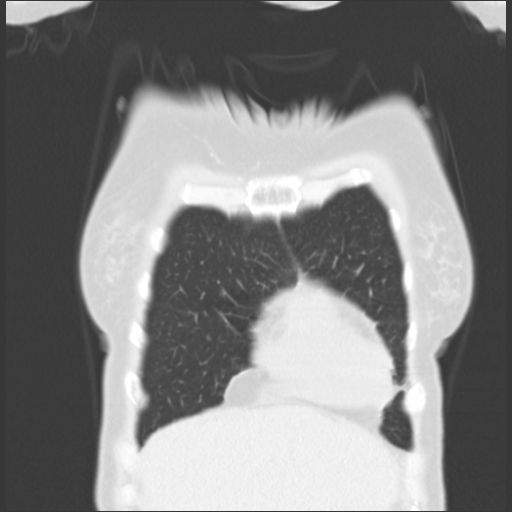
[im 29/71  lung]
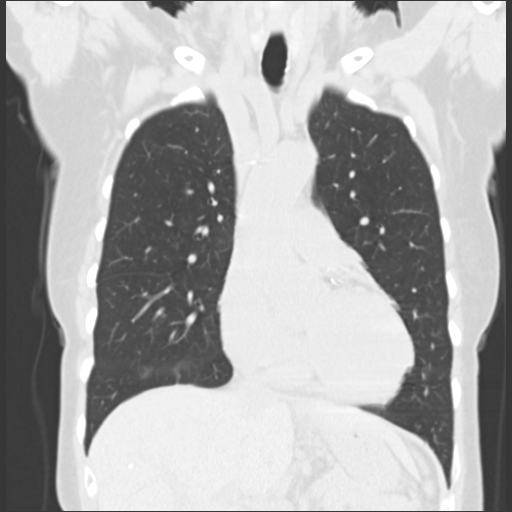
[im 43/71  lung]
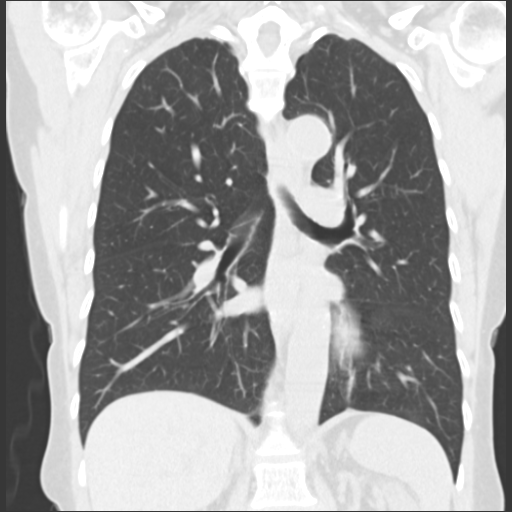

[15 of 36 positions shown; findings below may reference images not displayed]

FINDINGS: Again noted are two lobulated soft tissue nodules in the
left lower lobe, one at the inferior aspect of the hilum measuring
14.4 mm maximal diameter and the other being posterior in the left
lower lobe measuring 11 mm.

There is no discrete hilar or mediastinal adenopathy.  The right
lung is clear.  No effusions.  No significant osseous abnormality.

Again noted is the inhomogeneous solitary nodule in the right lobe
of the thyroid gland measuring 22 x 18 mm.
IMPRESSION: 1. Two lobulated soft tissue masses in the left lower lobe
worrisome for neoplasms.
2.  22 x 18 mm solitary inhomogeneous mass in the right lobe of the
thyroid gland.
3.  Images were obtained using the Super D protocol for
electromagnetic bronchoscopy utilization.
4.  PET scan to follow.

## 2012-10-12 NOTE — Telephone Encounter (Signed)
Called several phones. Hoe # not working. LMTCB on patient and hyusband cell 4:02 PM   Dr. Kalman Shan, M.D., [facility].C.P Pulmonary and Critical Care Medicine Staff Physician [place] [facility] [place] Pulmonary and Critical Care Pager: [phone], If no answer or between  15:00h - 7:00h: call [phone]  10/12/2012 4:02 PM

## 2012-10-14 NOTE — Telephone Encounter (Signed)
Will forward to MR so he is aware 

## 2012-10-14 NOTE — Telephone Encounter (Signed)
Pt's husband returned call.  Pt is in [place] right now.  Asks to be reached on Monday, 10/07/12 at [phone].  Antionette Fairy

## 2012-10-17 NOTE — Telephone Encounter (Signed)
I spoke with spouse. This is the best # to reach pt now. Will forward to MR so he is aware.

## 2012-10-17 NOTE — Telephone Encounter (Signed)
Husband returning call.  Asking to speak to MR.  [phone]

## 2012-10-19 NOTE — Telephone Encounter (Signed)
Pt returned call. She is still in [place] but can be reached on her cell [phone] or spouse richard @ [phone]. Call at 10am [place] time and it may be easier to reach her. Also says MR may LMOM with results as well unless he feels "otherwise". [name]

## 2012-10-21 NOTE — Telephone Encounter (Signed)
Gave result. [facility] blood test is inconclusive. 2 years nodule no growth but due to BAC issues, repeat ct chest in  1year. Order placed   Dr. Kalman Shan, M.D., [facility].C.P Pulmonary and Critical Care Medicine Staff Physician [facility] [place] Pulmonary and Critical Care Pager: [phone], If no answer or between  15:00h - 7:00h: call [phone]  10/21/2012 3:45 PM

## 2012-10-27 ENCOUNTER — Encounter: Payer: Self-pay | Admitting: Internal Medicine

## 2012-11-10 ENCOUNTER — Ambulatory Visit (INDEPENDENT_AMBULATORY_CARE_PROVIDER_SITE_OTHER): Payer: No Typology Code available for payment source | Admitting: Family Medicine

## 2012-11-10 ENCOUNTER — Encounter: Payer: Self-pay | Admitting: Family Medicine

## 2012-11-10 VITALS — BP 130/62 | Temp 98.1°F

## 2012-11-10 DIAGNOSIS — J019 Acute sinusitis, unspecified: Secondary | ICD-10-CM

## 2012-11-10 MED ORDER — AMOXICILLIN-POT CLAVULANATE 875-125 MG PO TABS: 1.0000 | ORAL_TABLET | Freq: Two times a day (BID) | ORAL | Status: AC

## 2012-11-10 NOTE — Progress Notes (Signed)
  Subjective:    Patient ID: [name], female    DOB: 07-21-1945, 67 y.o.   MRN: [id number]  HPI Recurrent sinus symptoms History of multiple episodes of sinusitis in the past, though not for several months now Three-week history of progressive frontal and maxillary sinus congestion. She has some thick yellow and occasional bloody nasal mucus No fever. Does have increased malaise. No cough. Does not tolerate decongestants. Has tried saline nasal irrigation without improvement. She states these are totally different from her allergy symptoms. Intermittent headaches.  Recent palpitations. Evaluated by cardiology. Repeat echo mitral valve regurgitation. Symptomatically stable. Previous catheterization no CAD.  Past Medical History  Diagnosis Date  . ANXIETY 02/19/2009  . INSOMNIA, CHRONIC 03/29/2009  . Migraine   . Heart murmur   . Skin cancer of arm     Squamous  . MVP (mitral valve prolapse)     NO MEDS  . Thyroid disease    Past Surgical History  Procedure Laterality Date  . Appendectomy  1969  . Tonsillectomy  1980  . Abdominal hysterectomy  1980    WITH BSO/FIBROIDS  . Oophorectomy      BSO WITH ABDOMINAL HYSTERECTOMY  . Thyroid cyst excision  05-01-11    RIGHT    reports that she quit smoking about 42 years ago. Her smoking use included Cigarettes. She has a 20 pack-year smoking history. She has never used smokeless tobacco. She reports that she drinks about 4.2 ounces of alcohol per week. She reports that she does not use illicit drugs. family history includes Alcohol abuse in her father; Breast cancer in her maternal grandmother; Cancer in her father and maternal aunt; and Diabetes in her mother. Allergies  Allergen Reactions  . Inderal (Propranolol Hcl)     SEVERE LIP AND SKIN DRYNESS      Review of Systems  Constitutional: Positive for fatigue. Negative for fever and chills.  HENT: Positive for congestion and postnasal drip.   Respiratory: Negative for cough  and shortness of breath.   Neurological: Positive for headaches.       Objective:   Physical Exam  Constitutional: She appears well-developed and well-nourished.  HENT:  Right Ear: External ear normal.  Left Ear: External ear normal.  Mouth/Throat: Oropharynx is clear and moist.  Neck: Neck supple. No thyromegaly present.  Cardiovascular: Normal rate and regular rhythm.   Pulmonary/Chest: Effort normal and breath sounds normal. No respiratory distress. She has no wheezes. She has no rales.  Lymphadenopathy:    She has no cervical adenopathy.          Assessment & Plan:  Acute sinusitis. Augmentin 875 mg twice daily for 10 days. Continue saline nasal irrigation.

## 2012-11-15 ENCOUNTER — Telehealth: Payer: Self-pay | Admitting: Family Medicine

## 2012-11-15 NOTE — Telephone Encounter (Signed)
Patient Information:  Caller Name: Raelie  Phone: [phone]  Patient: [name], [name]  Gender: Female  DOB: Dec 31, 1945  Age: 67 Years  PCP: Evelena [facility])  Office Follow Up:  Does the office need to follow up with this patient?: Yes  Instructions For The Office: Request Rx [facility] for Diflucan.  PLEASE CONTACT PATIENT .  She states if medicaiton is called into [facility] she can get in that in [place].  Leave message on phone if she does not answer.  RN Note:  Engineer, agricultural for Diflucan.  PLEASE CONTACT PATIENT .  She states if medicaiton is called into [facility] she can get in that in [place].  Leave message on phone if she does not answer.  Care advise provided and call back parameters  Symptoms  Reason For Call & Symptoms: Patient states when she takes Augmentin she gets Yeast infections. she forgot to ask for Diflucan.  She was in office on Thursday 11/10/12 and started medication.  She began with Yeast symptoms yesterday. +itching, no discharge.  REQUEST DIFLUCAN.  She is currently in [place] caring for her mother. Unable to make appt.  Reviewed Health History In EMR: Yes  Reviewed Medications In EMR: Yes  Reviewed Allergies In EMR: Yes  Reviewed Surgeries / Procedures: Yes  Date of Onset of Symptoms: 11/14/2012  Guideline(s) Used:  Vaginal Discharge  Disposition Per Guideline:   Home Care  Reason For Disposition Reached:   Symptoms of a vaginal yeast infection (i.e., white, thick, cottage-cheese-like, itchy, not bad smelling discharge)  Advice Given:  Genital Hygiene:   Keep your genital area clean. Wash daily.  Keep your genital area dry. Wear cotton underwear or underwear with a cotton crotch.  Do not douche.  Do not use feminine hygiene products.  Call Back If:  You become worse.  Antifungal Medication for Vaginal Yeast Infection:   Available in the [place] States: Femstat-3, miconazole (Monistat-3), clotrimazole (Gyne-Lotrimin-3, Mycelex-7),  butoconazole (Femstat-3).  RN Overrode Recommendation:  Patient Requests Prescription  Request Rx [facility] for D[facility]luc[facility].  PLEASE CONTACT [phone]   .  She states if medicaiton is called into [facility] she can get in that in [place].  Leave message on phone if she does not answer.

## 2012-11-16 MED ORDER — FLUCONAZOLE 150 MG PO TABS: 150.0000 mg | ORAL_TABLET | Freq: Once | ORAL | Status: DC

## 2012-11-16 NOTE — Telephone Encounter (Signed)
diflucan 150 mg one tablet

## 2012-11-16 NOTE — Telephone Encounter (Signed)
Pt is waiting on diflucan to be call into [facility]

## 2012-11-16 NOTE — Telephone Encounter (Signed)
Pt informed

## 2012-11-17 ENCOUNTER — Ambulatory Visit (INDEPENDENT_AMBULATORY_CARE_PROVIDER_SITE_OTHER): Payer: BC Managed Care – PPO | Admitting: Cardiology

## 2012-11-17 ENCOUNTER — Encounter: Payer: Self-pay | Admitting: Cardiology

## 2012-11-17 VITALS — BP 130/82 | HR 69 | Ht 69.0 in | Wt 135.0 lb

## 2012-11-17 DIAGNOSIS — I059 Rheumatic mitral valve disease, unspecified: Secondary | ICD-10-CM

## 2012-11-17 DIAGNOSIS — I340 Nonrheumatic mitral (valve) insufficiency: Secondary | ICD-10-CM

## 2012-11-17 DIAGNOSIS — R002 Palpitations: Secondary | ICD-10-CM

## 2012-11-17 NOTE — Patient Instructions (Addendum)
Your physician has recommended that you wear a holter monitor. Holter monitors are medical devices that record the heart's electrical activity. Doctors most often use these monitors to diagnose arrhythmias. Arrhythmias are problems with the speed or rhythm of the heartbeat. The monitor is a small, portable device. You can wear one while you do your normal daily activities. This is usually used to diagnose what is causing palpitations/syncope (passing out). 48 hour  Your physician has requested that you have an echocardiogram. Echocardiography is a painless test that uses sound waves to create images of your heart. It provides your doctor with information about the size and shape of your heart and how well your heart's chambers and valves are working. This procedure takes approximately one hour. There are no restrictions for this procedure. April 2015  Your physician wants you to follow-up in: 1 year with Dr Shirlee Latch. (May 2015).  You will receive a reminder letter in the mail two months in advance. If you don't receive a letter, please call our office to schedule the follow-up appointment.

## 2012-11-17 NOTE — Progress Notes (Signed)
Patient ID: [name], female   DOB: [birth year], 67 y.o.   MRN: [id number] PCP: Dr. Caryl Never  67 yo with history of mitral valve prolapse and mitral regurgitation presents for followup.  Workup was done for mitral regurgitation initially in 2012.  She has bileaflet MV prolapse.  It was moderate to severe by TEE in 6/12.  ETT showed excellent exercise tolerance with no evidence for exercise limitation from MR.  We planned to continue monitoring her mitral regurgitation periodically.  Patient was then admitted in 10/12 with severe chest pain while working in her yard.  She had a left heart cath showing only mild luminal irregularities. Echo showed moderate mitral regurgitation.  3 week event monitor showed only PACs. She has occasional fluttering in her chest that is likely attributable to the PACs.    She did well until about a month ago.  At that time, she developed increased palpitations.  When the palpitations occurred, she would feel short of breath.  She was seen in 4/14 as an add-on.  She was told to cut back on caffeine for presumed PACs and echo was done, showing EF 60-65% with moderate MR/bileaflet prolapse.  She is feeling better with less palpitations compared to prior appointment.  However, she still feels some palpitations on most days.  No lightheadedness or syncope.  She walks 3 miles/day most days without dyspnea or chest pain.    Labs (5/12): TSH normal, LDL 68, HDL 58, cardiac enzymes negative x 3 Labs (4/14): K 4.2, creatinine 0.8, TSH normal, BNP 38  PMH:  1. Mitral valve prolapse: Known since teenager.  Echo (5/12): EF 60%, normal LV size, grade II diastolic dysfunction, myxomatous mitral valve with bileaflet prolapse and moderate to severe mitral regurgitation, mild to moderate TR, PA systolic pressure 39 mmHg.  TEE (6/12) with EF 60%, moderate to severe MR with bileaflet prolapse and no systolic flow reversal in the pulmonary vein doppler signal, mild to moderate LAE.  ETT for  exercise capacity (7/12): 12'19" on treadmill, no evidence of symptomatic limitation from mitral regurgitation. Echo (10/12): EF 60-65%, severe MVP with moderate MR.  Echo (4/14) with EF 60-65%, moderate MR with bileaflet MVP and normal RV.  2. Right shoulder surgery 2012.  3. Pulmonary nodule: followed by Dr. Marchelle Gearing 4. Thyroid nodule s/p biopsy: benign.  5. TAH-BSO due to endometriosis.  6. Appendectomy.  7. Chest pain: Lexiscan myoview (6/12):  EF 63%, no ischemia or infarction.  Left heart cath (10/12): Mild luminal irregularities only.  8. Palpitations: 3-week event monitor (10/12) with PACs, otherwise no significant arrhythmia.   SH: Quit smoking at age 6.  Moved to [place] from [place] several years ago.  Married.  2 glasses wine/night.   FH: No CAD or valvular disease.   ROS: all systems reviewed and negative except as per HPI.   Current Outpatient Prescriptions  Medication Sig Dispense Refill  . ALPRAZolam (XANAX) 0.5 MG tablet TAKE 1 TABLET BY MOUTH EVERY 8 HOURS AS NEEDED FOR ANXIETY  30 tablet  0  . amoxicillin-clavulanate (AUGMENTIN) 875-125 MG per tablet Take 1 tablet by mouth 2 (two) times daily.  20 tablet  0  . aspirin 81 MG tablet Take 81 mg by mouth daily.        . fluconazole (DIFLUCAN) 150 MG tablet Take 1 tablet (150 mg total) by mouth once.  1 tablet  0  . mometasone (NASONEX) 50 MCG/ACT nasal spray Place 2 sprays into the nose daily.  17 g  3  . zolpidem (AMBIEN) 10 MG tablet TAKE 1 TABLET BY MOUTH AT BEDTIME  30 tablet  5   No current facility-administered medications for this visit.    BP 130/82  Pulse 69  Ht 5\' 9"  (1.753 m)  Wt 135 lb (61.236 kg)  BMI 19.93 kg/m2  SpO2 98%  LMP 06/22/1977 General: NAD Neck: No JVD, no thyromegaly or thyroid nodule.  Lungs: Clear to auscultation bilaterally with normal respiratory effort. CV: Nondisplaced PMI.  Heart regular S1/S2, no S3/S4, 3/6 mid to late systolic murmur at apex.  No peripheral edema.  No carotid bruit.   Normal pedal pulses.  Abdomen: Soft, nontender, no hepatosplenomegaly, no distention.  Neurologic: Alert and oriented x 3.  Psych: Normal affect. Extremities: No clubbing or cyanosis.   Assessment/Plan: 1. Mitral regurgitation: Moderate MR with MVP.  No significant exertional dyspnea.  I will repeat an echo in 1 year to follow.  2. Palpitations:  Has had PACs documented.  She is at some risk for atrial fibrillation given significant mitral regurgitation and h/o PACs.  She has some symptoms most days.  I will get a 48 hour holter monitor to look for any runs of atrial fibrillation.   Followup in 1 year if Holter unremarkable.   Marca Ancona 11/17/2012

## 2012-11-21 ENCOUNTER — Telehealth: Payer: Self-pay | Admitting: *Deleted

## 2012-11-21 ENCOUNTER — Encounter (INDEPENDENT_AMBULATORY_CARE_PROVIDER_SITE_OTHER): Payer: No Typology Code available for payment source

## 2012-11-21 DIAGNOSIS — R002 Palpitations: Secondary | ICD-10-CM

## 2012-11-21 DIAGNOSIS — I059 Rheumatic mitral valve disease, unspecified: Secondary | ICD-10-CM

## 2012-11-21 NOTE — Telephone Encounter (Signed)
48 hr holter monitor placed on Pt. 11/21/12 TK

## 2012-11-28 ENCOUNTER — Ambulatory Visit (INDEPENDENT_AMBULATORY_CARE_PROVIDER_SITE_OTHER): Payer: No Typology Code available for payment source | Admitting: Family Medicine

## 2012-11-28 DIAGNOSIS — M79651 Pain in right thigh: Secondary | ICD-10-CM

## 2012-11-28 DIAGNOSIS — M79609 Pain in unspecified limb: Secondary | ICD-10-CM

## 2012-11-28 NOTE — Patient Instructions (Addendum)
We will call you regarding doppler scan.

## 2012-11-28 NOTE — Progress Notes (Signed)
  Subjective:    Patient ID: [name], female    DOB: Nov 06, 1945, 67 y.o.   MRN: [id number]  HPI  Patient complains of slightly tender cystic like swelling medial aspect right knee First noted about one month ago. No injury. Minimal pain with ambulation She has varicose veins which are worse on the right side. No history of DVT She's noticed some slight induration to palpation. No alleviating factors. She has used heat couple times topically  Past Medical History  Diagnosis Date  . ANXIETY 02/19/2009  . INSOMNIA, CHRONIC 03/29/2009  . Migraine   . Heart murmur   . Skin cancer of arm     Squamous  . MVP (mitral valve prolapse)     NO MEDS  . Thyroid disease    Past Surgical History  Procedure Laterality Date  . Appendectomy  1969  . Tonsillectomy  1980  . Abdominal hysterectomy  1980    WITH BSO/FIBROIDS  . Oophorectomy      BSO WITH ABDOMINAL HYSTERECTOMY  . Thyroid cyst excision  05-01-11    RIGHT    reports that she quit smoking about 42 years ago. Her smoking use included Cigarettes. She has a 20 pack-year smoking history. She has never used smokeless tobacco. She reports that she drinks about 4.2 ounces of alcohol per week. She reports that she does not use illicit drugs. family history includes Alcohol abuse in her father; Breast cancer in her maternal grandmother; Cancer in her father and maternal aunt; and Diabetes in her mother. Allergies  Allergen Reactions  . Inderal (Propranolol Hcl)     SEVERE LIP AND SKIN DRYNESS     Review of Systems  Respiratory: Negative for cough and shortness of breath.   Cardiovascular: Negative for chest pain.       Objective:   Physical Exam  Constitutional: She appears well-developed and well-nourished.  Cardiovascular: Normal rate and regular rhythm.   Pulmonary/Chest: Effort normal and breath sounds normal. No respiratory distress. She has no wheezes. She has no rales.  Musculoskeletal: She exhibits no edema.  Right knee  reveals no effusion. Just medial to the right knee she has some soft tissue swelling cover an area about 1.5 x 2 cm. Question venous. Slightly bluish discoloration on surface. Slightly indurated to palpation. No warmth. Minimally tender. She does not have evidence for any edema inferior to this region. Foot pulses are normal. No thigh pain or tenderness          Assessment & Plan:  Right medial knee soft tissue swelling. Suspect venous. Low suspicion for DVT. Setup venous Dopplers to further assess

## 2012-11-29 ENCOUNTER — Other Ambulatory Visit: Payer: Self-pay | Admitting: Women's Health

## 2012-11-29 DIAGNOSIS — Z1231 Encounter for screening mammogram for malignant neoplasm of breast: Secondary | ICD-10-CM

## 2012-12-05 ENCOUNTER — Encounter (INDEPENDENT_AMBULATORY_CARE_PROVIDER_SITE_OTHER): Payer: No Typology Code available for payment source

## 2012-12-05 DIAGNOSIS — M79609 Pain in unspecified limb: Secondary | ICD-10-CM

## 2012-12-05 DIAGNOSIS — M79651 Pain in right thigh: Secondary | ICD-10-CM

## 2012-12-05 DIAGNOSIS — M7989 Other specified soft tissue disorders: Secondary | ICD-10-CM

## 2012-12-07 ENCOUNTER — Telehealth: Payer: Self-pay | Admitting: *Deleted

## 2012-12-07 NOTE — Telephone Encounter (Signed)
Dr Shirlee Latch reviewed monitor done 11/21/12. Predominant sinus. Occ PACs. Several brief runs SVT(<10 beats). If still symptomatic , could try metoprolol 12.5mg  po bid. I discussed with pt. Pt states currently symptoms are not bad enough to try medication.

## 2012-12-09 NOTE — Progress Notes (Signed)
Quick Note:  Pt informed on personally identified VM ______ 

## 2012-12-13 ENCOUNTER — Other Ambulatory Visit: Payer: Self-pay | Admitting: Family Medicine

## 2012-12-14 NOTE — Telephone Encounter (Signed)
Refill OK for 6 months. 

## 2012-12-14 NOTE — Telephone Encounter (Signed)
Request for Zolpidem Last Rx: 01.14.14, #30x5 Last OV: 06.09.14 Please advise on refills/SLS

## 2012-12-14 NOTE — Telephone Encounter (Signed)
Rx request phoned to pharmacy/SLS  

## 2012-12-19 ENCOUNTER — Telehealth: Payer: Self-pay | Admitting: Family Medicine

## 2012-12-19 NOTE — Telephone Encounter (Signed)
Spoke husband

## 2012-12-19 NOTE — Telephone Encounter (Signed)
Pt would like test results from the vein scan done 2 weeks,

## 2012-12-19 NOTE — Telephone Encounter (Signed)
Make sure pt aware venous doppler negative.

## 2012-12-19 NOTE — Telephone Encounter (Signed)
Called pt and left a vm to return call; Venous doppler negative for DVT

## 2012-12-21 NOTE — Telephone Encounter (Signed)
Patient was informed that her venous doppler was negative. Pt stated they she still has a lump on her knee and she wants to know what is the next step since her results come back negative.

## 2012-12-22 NOTE — Telephone Encounter (Signed)
Pt returned call. Advised pt of Dr Lucie Leather instructions.  Pt verbalized understanding. Will call back if rapidly enlarges or becomes more painful.

## 2012-12-22 NOTE — Telephone Encounter (Signed)
I think observation is reasonable unless this is rapidly enlarging or becoming more painful

## 2012-12-22 NOTE — Telephone Encounter (Signed)
Left message for patient to return call.

## 2013-01-03 ENCOUNTER — Ambulatory Visit (HOSPITAL_COMMUNITY): Payer: BC Managed Care – PPO

## 2013-01-17 ENCOUNTER — Ambulatory Visit (HOSPITAL_COMMUNITY)
Admission: RE | Admit: 2013-01-17 | Discharge: 2013-01-17 | Disposition: A | Discharge status: Home / Self Care | Payer: BC Managed Care – PPO | Source: Ambulatory Visit | Attending: Women's Health | Admitting: Women's Health

## 2013-01-17 DIAGNOSIS — Z1231 Encounter for screening mammogram for malignant neoplasm of breast: Secondary | ICD-10-CM | POA: Insufficient documentation

## 2013-02-07 ENCOUNTER — Telehealth: Payer: Self-pay | Admitting: Internal Medicine

## 2013-02-07 NOTE — Telephone Encounter (Signed)
Patient returning call.

## 2013-02-07 NOTE — Telephone Encounter (Signed)
LMOMTCB x 1 

## 2013-02-07 NOTE — Telephone Encounter (Signed)
Spoke with Victorino Dike and she has not seen any papers from [employer] for pt.  I advised papers that we do not papers and he will call his insurance agent and have them re fax to triage fax to Jennifer's attn.

## 2013-03-07 ENCOUNTER — Telehealth: Payer: Self-pay | Admitting: Family Medicine

## 2013-03-07 ENCOUNTER — Other Ambulatory Visit: Payer: Self-pay | Admitting: Family Medicine

## 2013-03-07 NOTE — Telephone Encounter (Signed)
yes

## 2013-03-07 NOTE — Telephone Encounter (Addendum)
Pt is no longer seeing Dr bates ENT  for tsh(Pt has half of her thyroid gland removedl)pt would like to come in for tsh blood work.can I sch?

## 2013-03-08 NOTE — Telephone Encounter (Signed)
Pt is sch for 03-10-13

## 2013-03-09 ENCOUNTER — Other Ambulatory Visit: Payer: Self-pay | Admitting: Family Medicine

## 2013-03-09 DIAGNOSIS — L819 Disorder of pigmentation, unspecified: Secondary | ICD-10-CM | POA: Diagnosis not present

## 2013-03-09 DIAGNOSIS — Z85828 Personal history of other malignant neoplasm of skin: Secondary | ICD-10-CM | POA: Diagnosis not present

## 2013-03-09 DIAGNOSIS — Z9009 Acquired absence of other part of head and neck: Secondary | ICD-10-CM

## 2013-03-09 DIAGNOSIS — L570 Actinic keratosis: Secondary | ICD-10-CM | POA: Diagnosis not present

## 2013-03-09 NOTE — Telephone Encounter (Signed)
What diagnosis  code do you want to you and i will put it in

## 2013-03-09 NOTE — Telephone Encounter (Signed)
Hx of thyroidectomy

## 2013-03-09 NOTE — Telephone Encounter (Signed)
Order has been put in 

## 2013-03-10 ENCOUNTER — Other Ambulatory Visit (INDEPENDENT_AMBULATORY_CARE_PROVIDER_SITE_OTHER): Payer: Medicare Other

## 2013-03-10 DIAGNOSIS — Z862 Personal history of diseases of the blood and blood-forming organs and certain disorders involving the immune mechanism: Secondary | ICD-10-CM | POA: Diagnosis not present

## 2013-03-10 DIAGNOSIS — Z9009 Acquired absence of other part of head and neck: Secondary | ICD-10-CM

## 2013-03-10 DIAGNOSIS — Z9889 Other specified postprocedural states: Secondary | ICD-10-CM | POA: Diagnosis not present

## 2013-04-28 ENCOUNTER — Telehealth: Payer: Self-pay | Admitting: Family Medicine

## 2013-04-28 ENCOUNTER — Encounter: Payer: Self-pay | Admitting: Family Medicine

## 2013-04-28 ENCOUNTER — Ambulatory Visit (INDEPENDENT_AMBULATORY_CARE_PROVIDER_SITE_OTHER): Payer: Medicare Other | Admitting: Family Medicine

## 2013-04-28 VITALS — BP 126/70 | HR 80 | Temp 97.4°F | Wt 140.0 lb

## 2013-04-28 DIAGNOSIS — S61409A Unspecified open wound of unspecified hand, initial encounter: Secondary | ICD-10-CM | POA: Diagnosis not present

## 2013-04-28 DIAGNOSIS — S61412A Laceration without foreign body of left hand, initial encounter: Secondary | ICD-10-CM

## 2013-04-28 NOTE — Progress Notes (Signed)
  Subjective:    Patient ID: [name], female    DOB: 1946/01/08, 67 y.o.   MRN: [id number]  HPI Acute visit for laceration left hand About one hour ago and accidentally while cutting vegetables in her kitchen Last tetanus 5 years ago. Minimal bleeding. She is full-strength in the hand a normal sensory function.  Past Medical History  Diagnosis Date  . ANXIETY 02/19/2009  . INSOMNIA, CHRONIC 03/29/2009  . Migraine   . Heart murmur   . Skin cancer of arm     Squamous  . MVP (mitral valve prolapse)     NO MEDS  . Thyroid disease    Past Surgical History  Procedure Laterality Date  . Appendectomy  1969  . Tonsillectomy  1980  . Abdominal hysterectomy  1980    WITH BSO/FIBROIDS  . Oophorectomy      BSO WITH ABDOMINAL HYSTERECTOMY  . Thyroid cyst excision  05-01-11    RIGHT    reports that she quit smoking about 42 years ago. Her smoking use included Cigarettes. She has a 20 pack-year smoking history. She has never used smokeless tobacco. She reports that she drinks about 4.2 ounces of alcohol per week. She reports that she does not use illicit drugs. family history includes Alcohol abuse in her father; Breast cancer in her maternal grandmother; Cancer in her father and maternal aunt; Diabetes in her mother. Allergies  Allergen Reactions  . Inderal [Propranolol Hcl]     SEVERE LIP AND SKIN DRYNESS      Review of Systems  Neurological: Negative for weakness and numbness.       Objective:   Physical Exam  Constitutional: She appears well-developed and well-nourished.  Cardiovascular: Normal rate and regular rhythm.   Skin:  2 cm laceration web space between first and second digit left hand. No active bleeding. No foreign bodies. Full range of motion all digits of the hand. No sensory impairment          Assessment & Plan:  Laceration left hand. Uncomplicated. Anesthesia with 1% plain Xylocaine. Irrigation with normal saline. Using sterile technique closed with 2  sutures of 4-0 Ethilon. Minimal bleeding. Patient tolerated well. Antibiotic and dressing applied. Wound care instruction given. Return 10 days for suture removal sooner as needed

## 2013-04-28 NOTE — Telephone Encounter (Signed)
Pt is on schedule here for today.

## 2013-04-28 NOTE — Patient Instructions (Signed)
Keep wound dry for the first 24 hours then clean daily with soap and water for one week. Apply topical antibiotic daily for 3-4 days. Keep covered with clean dressing for 4-5 days. Follow up promptly for any signs of infection such as redness, warmth, pain, or drainage. Return in 10 days for suture removal.

## 2013-04-28 NOTE — Telephone Encounter (Signed)
Patient Information:  Caller Name: Gerlene Burdock  Phone: [phone]  Patient: [name], [name]  Gender: Female  DOB: February 08, 1946  Age: 67 Years  PCP: Evelena Peat (Family Practice)  Office Follow Up:  Does the office need to follow up with this patient?: No  Instructions For The Office: N/A  RN Note:  Contacted office and spoke with Samara Deist for guidance advised to come to office at or be seen in the ER .  It is up to the patient. Patient prefers to be seen at office- appt time given for patient now.  She understands to go to the office for care  Symptoms  Reason For Call & Symptoms: Patient states she cut her hand while chopping meat. Cut left hand between thumb and and last knuckle on her forefinger.  Gapping open. 1 inch long , + Bleeding. Onset 30 minutes ago.  Immunizations are up to date.  She has area "pinched together". Bleeding has slowed.  Reviewed Health History In EMR: Yes  Reviewed Medications In EMR: Yes  Reviewed Allergies In EMR: Yes  Reviewed Surgeries / Procedures: Yes  Date of Onset of Symptoms: 04/28/2013  Treatments Tried: bandaid "pinched the area together"  Treatments Tried Worked: No  Guideline(s) Used:  Abrasions, Scratches, and Minor Cuts  Skin Injury  Disposition Per Guideline:   Go to ED Now (or to Office with PCP Approval)  Reason For Disposition Reached:   Skin is split open or gaping (length > 1/2 inch or 12 mm on the skin, 1/4 inch or 6 mm on the face)  Advice Given:  Bleeding  : Apply direct pressure for 10 minutes with a sterile gauze to stop any bleeding.  Cleaning the Wound:  Wash the wound with soap and water for 5 minutes.  For any dirt, scrub gently with a wash cloth.  For any bleeding, apply direct pressure with a sterile gauze or clean cloth for 10 minutes.  Call Back If:  You become worse.  Patient Will Follow Care Advice:  YES

## 2013-05-11 ENCOUNTER — Ambulatory Visit: Payer: Medicare Other | Admitting: Family Medicine

## 2013-05-16 ENCOUNTER — Encounter: Payer: Self-pay | Admitting: Family

## 2013-05-16 ENCOUNTER — Ambulatory Visit (INDEPENDENT_AMBULATORY_CARE_PROVIDER_SITE_OTHER): Payer: Medicare Other | Admitting: Family

## 2013-05-16 VITALS — BP 128/78 | HR 74 | Temp 97.5°F | Wt 139.0 lb

## 2013-05-16 DIAGNOSIS — J309 Allergic rhinitis, unspecified: Secondary | ICD-10-CM

## 2013-05-16 DIAGNOSIS — J019 Acute sinusitis, unspecified: Secondary | ICD-10-CM | POA: Diagnosis not present

## 2013-05-16 MED ORDER — AMOXICILLIN-POT CLAVULANATE 875-125 MG PO TABS: 1.0000 | ORAL_TABLET | Freq: Two times a day (BID) | ORAL | Status: DC

## 2013-05-16 NOTE — Patient Instructions (Signed)

## 2013-05-16 NOTE — Progress Notes (Signed)
Subjective:    Patient ID: [name], female    DOB: 01-07-1946, 67 y.o.   MRN: [id number]  HPI  67 year old white female, nonsmoker, Dr. Caryl Never, is in today with complaints of sinus pressure and pain x1 week and worsening. She has not been taking any over-the-counter medications. Denies any fever but has been achy.  Review of Systems  Constitutional: Negative.   HENT: Positive for congestion, sinus pressure and sneezing.   Respiratory: Negative.   Cardiovascular: Negative.   Musculoskeletal: Negative.   Skin: Negative.   Allergic/Immunologic: Negative.   Psychiatric/Behavioral: Negative.    Past Medical History  Diagnosis Date  . ANXIETY 02/19/2009  . INSOMNIA, CHRONIC 03/29/2009  . Migraine   . Heart murmur   . Skin cancer of arm     Squamous  . MVP (mitral valve prolapse)     NO MEDS  . Thyroid disease     History   Social History  . Marital Status: Married    Spouse Name: N/A    Number of Children: N/A  . Years of Education: N/A   Occupational History  . retired    Social History Main Topics  . Smoking status: Former Smoker -- 1.00 packs/day for 20 years    Types: Cigarettes    Quit date: 08/11/1970  . Smokeless tobacco: Never Used  . Alcohol Use: 4.2 oz/week    7 Glasses of wine per week  . Drug Use: No  . Sexual Activity: Yes    Birth Control/ Protection: Surgical   Other Topics Concern  . Not on file   Social History Narrative  . No narrative on file    Past Surgical History  Procedure Laterality Date  . Appendectomy  1969  . Tonsillectomy  1980  . Abdominal hysterectomy  1980    WITH BSO/FIBROIDS  . Oophorectomy      BSO WITH ABDOMINAL HYSTERECTOMY  . Thyroid cyst excision  05-01-11    RIGHT    Family History  Problem Relation Age of Onset  . Diabetes Mother     type ll  . Alcohol abuse Father   . Cancer Father     THROAT  . Cancer Maternal Aunt     ovarian  . Breast cancer Maternal Grandmother     Allergies  Allergen  Reactions  . Inderal [Propranolol Hcl]     SEVERE LIP AND SKIN DRYNESS    Current Outpatient Prescriptions on File Prior to Visit  Medication Sig Dispense Refill  . ALPRAZolam (XANAX) 0.5 MG tablet TAKE 1 TABLET BY MOUTH EVERY 8 HOURS AS NEEDED FOR ANXIETY  30 tablet  0  . aspirin 81 MG tablet Take 81 mg by mouth daily.        . fluconazole (DIFLUCAN) 150 MG tablet Take 1 tablet (150 mg total) by mouth once.  1 tablet  0  . mometasone (NASONEX) 50 MCG/ACT nasal spray Place 2 sprays into the nose daily.  17 g  3  . zolpidem (AMBIEN) 10 MG tablet TAKE ONE TABLET BY MOUTH ONCE DAILY AT BEDTIME  30 tablet  5   No current facility-administered medications on file prior to visit.    BP 128/78  Pulse 74  Temp(Src) 97.5 F (36.4 C) (Oral)  Wt 139 lb (63.05 kg)  LMP 01/01/1979chart    Objective:   Physical Exam  Constitutional: She is oriented to person, place, and time. She appears well-developed and well-nourished.  HENT:  Right Ear: External ear normal.  Left  Ear: External ear normal.  Nose: Nose normal.  Mouth/Throat: Oropharynx is clear and moist.  Tenderness to palpation of the maxillary sinuses bilaterally.  Neck: Normal range of motion. Neck supple.  Cardiovascular: Normal rate, regular rhythm and normal heart sounds.   Pulmonary/Chest: Effort normal and breath sounds normal.  Musculoskeletal: Normal range of motion.  Neurological: She is alert and oriented to person, place, and time.  Skin: Skin is warm and dry.  Psychiatric: She has a normal mood and affect.          Assessment & Plan:  Assessment: 1. Acute sinusitis 2. Allergic rhinitis  Plan: I've advised patient that I think is related to the sinusitis but because she's going out of town I will send a prescription for Augmentin for her to take with her to a [facility]. Nasonex 2 sprays in each nostril once a day. Augmentin twice a day

## 2013-05-22 ENCOUNTER — Telehealth: Payer: Self-pay | Admitting: Family Medicine

## 2013-05-22 NOTE — Telephone Encounter (Signed)
Pt can call her insurance company, the number is on the back of her card and talk to them directly.

## 2013-05-22 NOTE — Telephone Encounter (Signed)
Pt states she read that one she gets 67 her zolpidem (AMBIEN) 10 MG tablet will not be covered. Pt would like you to check in to this.she think this is her last refill.   Pharm/ [facility] [employer]

## 2013-05-23 ENCOUNTER — Telehealth: Payer: Self-pay | Admitting: *Deleted

## 2013-05-23 MED ORDER — FLUCONAZOLE 150 MG PO TABS: 150.0000 mg | ORAL_TABLET | Freq: Once | ORAL | Status: DC

## 2013-05-23 NOTE — Telephone Encounter (Signed)
Pt aware.

## 2013-05-23 NOTE — Telephone Encounter (Signed)
Pt walked in stating she has a yeast infection.  She was seen by Padonda on 05/16/13 for sinusitis and was given Augmentin 875 mg bid.  She has not tried anything OTC cause she is allergic to mineral oil.  She is complaining of vaginal itch and discharge, no odor.  Per Dr Caryl Never ok to call in diflucan 150 mg #1.  Pt uses [facility].  Told pt I would call in rx and explained that it was common to get a yeast infection while taking an antibiotic.  Pt verbalized understanding and had no questions.

## 2013-06-02 ENCOUNTER — Encounter: Payer: BC Managed Care – PPO | Admitting: Women's Health

## 2013-06-07 ENCOUNTER — Telehealth: Payer: Self-pay | Admitting: Family Medicine

## 2013-06-07 NOTE — Telephone Encounter (Signed)
Pt needs PA on  [employer]

## 2013-06-13 ENCOUNTER — Telehealth: Payer: Self-pay | Admitting: Family Medicine

## 2013-06-13 NOTE — Telephone Encounter (Signed)
I received a fax denying Zolpidem for pt.  It states medication has maximum dispensing limits in place.  The annual limit for medication is 90 tablets/capsules or 23.1 ml per 365 days.   I was advised that [facility] will fill 30 days for $13 and that is without insurance.

## 2013-06-14 NOTE — Telephone Encounter (Signed)
Left message for patient to return call.

## 2013-06-14 NOTE — Telephone Encounter (Signed)
Pt wants to know if you can write her RX for #30. And on the RX it must say recommended by doctor

## 2013-06-14 NOTE — Telephone Encounter (Signed)
Let pt know insurance not covering.

## 2013-06-16 MED ORDER — ZOLPIDEM TARTRATE 10 MG PO TABS: ORAL_TABLET | ORAL | Status: DC

## 2013-06-16 NOTE — Telephone Encounter (Signed)
Faxed RX to pharmacy.  

## 2013-06-16 NOTE — Telephone Encounter (Signed)
OK to provide printed copy.

## 2013-06-16 NOTE — Telephone Encounter (Signed)
Pt states rx needs to state " physician has prescribed , 10 mg one a night, every night"  zolpidem (AMBIEN) 10 MG tablet  [facility]/ [address].

## 2013-06-16 NOTE — Telephone Encounter (Signed)
Left message for patient to return call.

## 2013-06-20 ENCOUNTER — Ambulatory Visit (INDEPENDENT_AMBULATORY_CARE_PROVIDER_SITE_OTHER): Payer: Medicare Other | Admitting: Family Medicine

## 2013-06-20 ENCOUNTER — Encounter: Payer: Self-pay | Admitting: Family Medicine

## 2013-06-20 VITALS — BP 126/78 | HR 85 | Temp 97.9°F | Wt 141.0 lb

## 2013-06-20 DIAGNOSIS — Z23 Encounter for immunization: Secondary | ICD-10-CM

## 2013-06-20 DIAGNOSIS — L089 Local infection of the skin and subcutaneous tissue, unspecified: Secondary | ICD-10-CM

## 2013-06-20 MED ORDER — DOXYCYCLINE HYCLATE 100 MG PO CAPS: 100.0000 mg | ORAL_CAPSULE | Freq: Two times a day (BID) | ORAL | Status: DC

## 2013-06-20 NOTE — Telephone Encounter (Signed)
Pt is coming back up to the office for the papers

## 2013-06-20 NOTE — Progress Notes (Signed)
   Subjective:    Patient ID: [name], female    DOB: June 01, 1946, 67 y.o.   MRN: [id number]  HPI  Patient seen with recent right anterior knee swelling. She describes swelling in the right prepatellar region. About a week ago she noticed erythematous papule with pustular center. This was initially painful. She took a needle and punctured this and drained out some pus and this is actually improving at this time. She's not any fever or chills. No knee effusion. She is applying warm compresses several times daily  Patient has chronic insomnia. She has for several years been on Ambien and recently had some issues with insurance coverage. She has tried several times tapering off without success. She has tried over-the-counter medications without relief.  Past Medical History  Diagnosis Date  . ANXIETY 02/19/2009  . INSOMNIA, CHRONIC 03/29/2009  . Migraine   . Heart murmur   . Skin cancer of arm     Squamous  . MVP (mitral valve prolapse)     NO MEDS  . Thyroid disease    Past Surgical History  Procedure Laterality Date  . Appendectomy  1969  . Tonsillectomy  1980  . Abdominal hysterectomy  1980    WITH BSO/FIBROIDS  . Oophorectomy      BSO WITH ABDOMINAL HYSTERECTOMY  . Thyroid cyst excision  05-01-11    RIGHT    reports that she quit smoking about 42 years ago. Her smoking use included Cigarettes. She has a 20 pack-year smoking history. She has never used smokeless tobacco. She reports that she drinks about 4.2 ounces of alcohol per week. She reports that she does not use illicit drugs. family history includes Alcohol abuse in her father; Breast cancer in her maternal grandmother; Cancer in her father and maternal aunt; Diabetes in her mother. Allergies  Allergen Reactions  . Inderal [Propranolol Hcl]     SEVERE LIP AND SKIN DRYNESS     Review of Systems  Constitutional: Negative for fever and chills.  Psychiatric/Behavioral: Negative for dysphoric mood.       Objective:     Physical Exam  Constitutional: She appears well-developed and well-nourished.  Cardiovascular: Normal rate.   Pulmonary/Chest: Effort normal and breath sounds normal. No respiratory distress. She has no wheezes. She has no rales.  Skin:  Right knee reveals small erythematous papule which is nontender anterior over the patella region. Non-fluctuance. No pustules. No warmth. No evidence for knee effusion. Full range of motion right knee.          Assessment & Plan:  Recent pustule right anterior knee which appears to be resolving. Continue warm compresses. If she has any evidence for recurrent inflammation such as warmth, edema, or erythema start doxycycline otherwise observe for now

## 2013-06-20 NOTE — Patient Instructions (Signed)
Continue with warm compresses Start antibiotic if you note any recurrent redness, warmth, swelling, or pain.

## 2013-06-20 NOTE — Progress Notes (Signed)
Pre visit review using our clinic review tool, if applicable. No additional management support is needed unless otherwise documented below in the visit note. 

## 2013-06-20 NOTE — Telephone Encounter (Signed)
Pt returning your call

## 2013-06-21 ENCOUNTER — Telehealth: Payer: Self-pay | Admitting: Family Medicine

## 2013-06-21 MED ORDER — FLUCONAZOLE 150 MG PO TABS: 150.0000 mg | ORAL_TABLET | Freq: Once | ORAL | Status: DC

## 2013-06-21 NOTE — Telephone Encounter (Signed)
Left message informing the patient Rx is sent to pharmacy

## 2013-06-21 NOTE — Telephone Encounter (Signed)
Let her know I have sent this in already

## 2013-06-21 NOTE — Telephone Encounter (Signed)
Patient's husband came in requesting that Diflucan be called in to [facility] at [facility] and [address] [phone] for his wife, [name]. She is experiencing another yeast infection. Thanks!  Pt was last seen 06/20/13

## 2013-06-28 ENCOUNTER — Encounter: Payer: Self-pay | Admitting: Women's Health

## 2013-07-05 ENCOUNTER — Encounter: Payer: Self-pay | Admitting: Women's Health

## 2013-07-06 ENCOUNTER — Encounter: Payer: Self-pay | Admitting: Family Medicine

## 2013-07-06 ENCOUNTER — Ambulatory Visit (INDEPENDENT_AMBULATORY_CARE_PROVIDER_SITE_OTHER): Payer: Medicare Other | Admitting: Family Medicine

## 2013-07-06 VITALS — BP 112/68 | HR 66 | Temp 97.3°F | Wt 141.0 lb

## 2013-07-06 DIAGNOSIS — M6740 Ganglion, unspecified site: Secondary | ICD-10-CM | POA: Diagnosis not present

## 2013-07-06 DIAGNOSIS — L723 Sebaceous cyst: Secondary | ICD-10-CM

## 2013-07-06 DIAGNOSIS — L729 Follicular cyst of the skin and subcutaneous tissue, unspecified: Secondary | ICD-10-CM

## 2013-07-06 NOTE — Progress Notes (Signed)
Pre visit review using our clinic review tool, if applicable. No additional management support is needed unless otherwise documented below in the visit note. 

## 2013-07-06 NOTE — Progress Notes (Signed)
   Subjective:    Patient ID: [name], female    DOB: [birth year], 68 y.o.   MRN: [id number]  HPI Persistent small cystic inflammation skin right anterior knee. We had seen her recently and this appeared to be resolving. She punctured this a couple times and had a bloody serous-type drainage. No recent purulent drainage. No fevers or chills. Denies any surrounding erythema. Ambulating without difficulty. No recent visible pustule.  She recently took a course of doxycycline which she thinks may have helped briefly. No history of MRSA. No pain with ambulation  Past Medical History  Diagnosis Date  . ANXIETY 02/19/2009  . INSOMNIA, CHRONIC 03/29/2009  . Migraine   . Heart murmur   . Skin cancer of arm     Squamous  . MVP (mitral valve prolapse)     NO MEDS  . Thyroid disease    Past Surgical History  Procedure Laterality Date  . Appendectomy  1969  . Tonsillectomy  1980  . Abdominal hysterectomy  1980    WITH BSO/FIBROIDS  . Oophorectomy      BSO WITH ABDOMINAL HYSTERECTOMY  . Thyroid cyst excision  05-01-11    RIGHT    reports that she quit smoking about 42 years ago. Her smoking use included Cigarettes. She has a 20 pack-year smoking history. She has never used smokeless tobacco. She reports that she drinks about 4.2 ounces of alcohol per week. She reports that she does not use illicit drugs. family history includes Alcohol abuse in her father; Breast cancer in her maternal grandmother; Cancer in her father and maternal aunt; Diabetes in her mother. Allergies  Allergen Reactions  . Inderal [Propranolol Hcl]     SEVERE LIP AND SKIN DRYNESS      Review of Systems  Constitutional: Negative for fever and chills.  Musculoskeletal: Negative for gait problem.       Objective:   Physical Exam  Constitutional: She appears well-developed and well-nourished.  Cardiovascular: Normal rate.   Pulmonary/Chest: Effort normal and breath sounds normal. No respiratory distress.    Skin:  Right knee reveals small approximately 4-5 mm area of redness which is minimally raised and minimally fluctuant. No pustules. Minimally tender. No surrounding cellulitis changes. No evidence for bursal inflammation. Full range of motion knee.          Assessment & Plan:  Small superficial cystic lesion right anterior knee. There was some question of small abscess though no surrounding cellulitis changes. This had not resolved with doxycycline and was slightly fluctuant. We discussed risk and benefits of incision and drainage and patient agreed. Knee prepped with Betadine. Using 1% plain Xylocaine anesthesia applied. Made very small superficial linear incision about 5 mm in length over area of fluctuance. We expressed some clear viscous fluid. No purulence. Minimal bleeding controlled with pressure and silver nitrate.  After opening, no evidence for abscess. This appears to be small mucoid type cyst. Antibiotic and dressing applied. Wound care instrucitons given. If this recurs, orthopedic referral.

## 2013-07-06 NOTE — Patient Instructions (Signed)
Keep wound dry for the first 24 hours then clean daily with soap and water for one week. Apply topical antibiotic daily for 3-4 days. Keep covered with clean dressing for 4-5 days. Follow up promptly for any signs of infection such as redness, warmth, pain, or drainage.  

## 2013-07-07 DIAGNOSIS — M9981 Other biomechanical lesions of cervical region: Secondary | ICD-10-CM | POA: Diagnosis not present

## 2013-07-07 DIAGNOSIS — M47812 Spondylosis without myelopathy or radiculopathy, cervical region: Secondary | ICD-10-CM | POA: Diagnosis not present

## 2013-07-07 DIAGNOSIS — M5030 Other cervical disc degeneration, unspecified cervical region: Secondary | ICD-10-CM | POA: Diagnosis not present

## 2013-07-07 DIAGNOSIS — M999 Biomechanical lesion, unspecified: Secondary | ICD-10-CM | POA: Diagnosis not present

## 2013-07-07 DIAGNOSIS — M47814 Spondylosis without myelopathy or radiculopathy, thoracic region: Secondary | ICD-10-CM | POA: Diagnosis not present

## 2013-07-09 LAB — WOUND CULTURE
GRAM STAIN: NONE SEEN
Gram Stain: NONE SEEN
Organism ID, Bacteria: NO GROWTH

## 2013-07-10 DIAGNOSIS — M47812 Spondylosis without myelopathy or radiculopathy, cervical region: Secondary | ICD-10-CM | POA: Diagnosis not present

## 2013-07-10 DIAGNOSIS — M9981 Other biomechanical lesions of cervical region: Secondary | ICD-10-CM | POA: Diagnosis not present

## 2013-07-10 DIAGNOSIS — M47814 Spondylosis without myelopathy or radiculopathy, thoracic region: Secondary | ICD-10-CM | POA: Diagnosis not present

## 2013-07-10 DIAGNOSIS — M999 Biomechanical lesion, unspecified: Secondary | ICD-10-CM | POA: Diagnosis not present

## 2013-07-18 ENCOUNTER — Encounter: Payer: Self-pay | Admitting: Women's Health

## 2013-07-18 ENCOUNTER — Ambulatory Visit (INDEPENDENT_AMBULATORY_CARE_PROVIDER_SITE_OTHER): Payer: Medicare Other | Admitting: Women's Health

## 2013-07-18 VITALS — BP 130/68 | Ht 66.75 in | Wt 141.0 lb

## 2013-07-18 DIAGNOSIS — M899 Disorder of bone, unspecified: Secondary | ICD-10-CM | POA: Diagnosis not present

## 2013-07-18 DIAGNOSIS — M8580 Other specified disorders of bone density and structure, unspecified site: Secondary | ICD-10-CM

## 2013-07-18 DIAGNOSIS — M999 Biomechanical lesion, unspecified: Secondary | ICD-10-CM | POA: Diagnosis not present

## 2013-07-18 DIAGNOSIS — L293 Anogenital pruritus, unspecified: Secondary | ICD-10-CM

## 2013-07-18 DIAGNOSIS — M949 Disorder of cartilage, unspecified: Secondary | ICD-10-CM

## 2013-07-18 DIAGNOSIS — M47814 Spondylosis without myelopathy or radiculopathy, thoracic region: Secondary | ICD-10-CM | POA: Diagnosis not present

## 2013-07-18 DIAGNOSIS — M9981 Other biomechanical lesions of cervical region: Secondary | ICD-10-CM | POA: Diagnosis not present

## 2013-07-18 DIAGNOSIS — M47812 Spondylosis without myelopathy or radiculopathy, cervical region: Secondary | ICD-10-CM | POA: Diagnosis not present

## 2013-07-18 DIAGNOSIS — Z780 Asymptomatic menopausal state: Secondary | ICD-10-CM | POA: Diagnosis not present

## 2013-07-18 DIAGNOSIS — N898 Other specified noninflammatory disorders of vagina: Secondary | ICD-10-CM

## 2013-07-18 MED ORDER — NYSTATIN 100000 UNIT/GM EX CREA: 1.0000 "application " | TOPICAL_CREAM | Freq: Two times a day (BID) | CUTANEOUS | Status: DC

## 2013-07-18 NOTE — Progress Notes (Signed)
[name] 01/16/1946 [id number]    History:    Presents for breast and pelvic exam. 1980 TAH with BSO for fibroids. Normal Pap and mammogram history. Negative colonoscopy. History of squamous cell skin cancer has skin checks every 3-6 months. History of the left lower lobe lung nodule, has a CT scan annual with no change primary care manages. Insomnia/ anxiety primary care manages.  Past medical history, past surgical history, family history and social history were all reviewed and documented in the EPIC chart. Retired, one child in [place], 2 live here. Active lifestyle, placed tenderness, planning to move to [place].   Exam:  Filed Vitals:   07/18/13 1534  BP: 130/68    General appearance:  Normal Thyroid:  Symmetrical, normal in size, without palpable masses or nodularity. Respiratory  Auscultation:  Clear without wheezing or rhonchi Cardiovascular  Auscultation:  Regular rate, without rubs, murmurs or gallops  Edema/varicosities:  Not grossly evident Abdominal  Soft,nontender, without masses, guarding or rebound.  Liver/spleen:  No organomegaly noted  Hernia:  None appreciated  Skin  Inspection:  Grossly normal   Breasts: Examined lying and sitting.     Right: Without masses, retractions, discharge or axillary adenopathy.     Left: Without masses, retractions, discharge or axillary adenopathy. Gentitourinary   Inguinal/mons:  Normal without inguinal adenopathy  External genitalia:  Normal  BUS/Urethra/Skene's glands:  Normal  Vagina:  Normal  Cervix:  Absent  Uterus:  Absent  Adnexa/parametria:     Rt: Without masses or tenderness.   Lt: Without masses or tenderness.  Anus and perineum: Normal  Digital rectal exam: Normal sphincter tone without palpated masses or tenderness  Assessment/Plan:  68 y.o. MWF G3P3 for breast and pelvic exam with no complaints.  TAH with BSO on no HRT Insomnia/anxiety/history of left lower lobe lung nodule-primary care manages labs  and meds Squamous, Skin cancer history-dermatologist MVP-cardiologist  Plan: DEXA, reports normal DEXA in the past, will schedule here. SBE's, continue annual mammogram, calcium rich diet, vitamin D 2000 daily, continue regular exercise. Current on vaccines.   Huel Cote WHNP, 5:20 PM 07/18/2013

## 2013-07-18 NOTE — Patient Instructions (Signed)
Health Recommendations for Postmenopausal Women Respected and ongoing research has looked at the most common causes of death, disability, and poor quality of life in postmenopausal women. The causes include heart disease, diseases of blood vessels, diabetes, depression, cancer, and bone loss (osteoporosis). Many things can be done to help lower the chances of developing these and other common problems: CARDIOVASCULAR DISEASE Heart Disease: A heart attack is a medical emergency. Know the signs and symptoms of a heart attack. Below are things women can do to reduce their risk for heart disease.   Do not smoke. If you smoke, quit.  Aim for a healthy weight. Being overweight causes many preventable deaths. Eat a healthy and balanced diet and drink an adequate amount of liquids.  Get moving. Make a commitment to be more physically active. Aim for 30 minutes of activity on most, if not all days of the week.  Eat for heart health. Choose a diet that is low in saturated fat and cholesterol and eliminate trans fat. Include whole grains, vegetables, and fruits. Read and understand the labels on food containers before buying.  Know your numbers. Ask your caregiver to check your blood pressure, cholesterol (total, HDL, LDL, triglycerides) and blood glucose. Work with your caregiver on improving your entire clinical picture.  High blood pressure. Limit or stop your table salt intake (try salt substitute and food seasonings). Avoid salty foods and drinks. Read labels on food containers before buying. Eating well and exercising can help control high blood pressure. STROKE  Stroke is a medical emergency. Stroke may be the result of a blood clot in a blood vessel in the brain or by a brain hemorrhage (bleeding). Know the signs and symptoms of a stroke. To lower the risk of developing a stroke:  Avoid fatty foods.  Quit smoking.  Control your diabetes, blood pressure, and irregular heart rate. THROMBOPHLEBITIS  (BLOOD CLOT) OF THE LEG  Becoming overweight and leading a stationary lifestyle may also contribute to developing blood clots. Controlling your diet and exercising will help lower the risk of developing blood clots. CANCER SCREENING  Breast Cancer: Take steps to reduce your risk of breast cancer.  You should practice "breast self-awareness." This means understanding the normal appearance and feel of your breasts and should include breast self-examination. Any changes detected, no matter how small, should be reported to your caregiver.  After age 40, you should have a clinical breast exam (CBE) every year.  Starting at age 40, you should consider having a mammogram (breast X-ray) every year.  If you have a family history of breast cancer, talk to your caregiver about genetic screening.  If you are at high risk for breast cancer, talk to your caregiver about having an MRI and a mammogram every year.  Intestinal or Stomach Cancer: Tests to consider are a rectal exam, fecal occult blood, sigmoidoscopy, and colonoscopy. Women who are high risk may need to be screened at an earlier age and more often.  Cervical Cancer:  Beginning at age 30, you should have a Pap test every 3 years as long as the past 3 Pap tests have been normal.  If you have had past treatment for cervical cancer or a condition that could lead to cancer, you need Pap tests and screening for cancer for at least 20 years after your treatment.  If you had a hysterectomy for a problem that was not cancer or a condition that could lead to cancer, then you no longer need Pap tests.    If you are between ages 65 and 70, and you have had normal Pap tests going back 10 years, you no longer need Pap tests.  If Pap tests have been discontinued, risk factors (such as a new sexual partner) need to be reassessed to determine if screening should be resumed.  Some medical problems can increase the chance of getting cervical cancer. In these  cases, your caregiver may recommend more frequent screening and Pap tests.  Uterine Cancer: If you have vaginal bleeding after reaching menopause, you should notify your caregiver.  Ovarian cancer: Other than yearly pelvic exams, there are no reliable tests available to screen for ovarian cancer at this time except for yearly pelvic exams.  Lung Cancer: Yearly chest X-rays can detect lung cancer and should be done on high risk women, such as cigarette smokers and women with chronic lung disease (emphysema).  Skin Cancer: A complete body skin exam should be done at your yearly examination. Avoid overexposure to the sun and ultraviolet light lamps. Use a strong sun block cream when in the sun. All of these things are important in lowering the risk of skin cancer. MENOPAUSE Menopause Symptoms: Hormone therapy products are effective for treating symptoms associated with menopause:  Moderate to severe hot flashes.  Night sweats.  Mood swings.  Headaches.  Tiredness.  Loss of sex drive.  Insomnia.  Other symptoms. Hormone replacement carries certain risks, especially in older women. Women who use or are thinking about using estrogen or estrogen with progestin treatments should discuss that with their caregiver. Your caregiver will help you understand the benefits and risks. The ideal dose of hormone replacement therapy is not known. The Food and Drug Administration (FDA) has concluded that hormone therapy should be used only at the lowest doses and for the shortest amount of time to reach treatment goals.  OSTEOPOROSIS Protecting Against Bone Loss and Preventing Fracture: If you use hormone therapy for prevention of bone loss (osteoporosis), the risks for bone loss must outweigh the risk of the therapy. Ask your caregiver about other medications known to be safe and effective for preventing bone loss and fractures. To guard against bone loss or fractures, the following is recommended:  If  you are less than age 50, take 1000 mg of calcium and at least 600 mg of Vitamin D per day.  If you are greater than age 50 but less than age 70, take 1200 mg of calcium and at least 600 mg of Vitamin D per day.  If you are greater than age 70, take 1200 mg of calcium and at least 800 mg of Vitamin D per day. Smoking and excessive alcohol intake increases the risk of osteoporosis. Eat foods rich in calcium and vitamin D and do weight bearing exercises several times a week as your caregiver suggests. DIABETES Diabetes Melitus: If you have Type I or Type 2 diabetes, you should keep your blood sugar under control with diet, exercise and recommended medication. Avoid too many sweets, starchy and fatty foods. Being overweight can make control more difficult. COGNITION AND MEMORY Cognition and Memory: Menopausal hormone therapy is not recommended for the prevention of cognitive disorders such as Alzheimer's disease or memory loss.  DEPRESSION  Depression may occur at any age, but is common in elderly women. The reasons may be because of physical, medical, social (loneliness), or financial problems and needs. If you are experiencing depression because of medical problems and control of symptoms, talk to your caregiver about this. Physical activity and   exercise may help with mood and sleep. Community and volunteer involvement may help your sense of value and worth. If you have depression and you feel that the problem is getting worse or becoming severe, talk to your caregiver about treatment options that are best for you. ACCIDENTS  Accidents are common and can be serious in the elderly woman. Prepare your house to prevent accidents. Eliminate throw rugs, place hand bars in the bath, shower and toilet areas. Avoid wearing high heeled shoes or walking on wet, snowy, and icy areas. Limit or stop driving if you have vision or hearing problems, or you feel you are unsteady with you movements and  reflexes. HEPATITIS C Hepatitis C is a type of viral infection affecting the liver. It is spread mainly through contact with blood from an infected person. It can be treated, but if left untreated, it can lead to severe liver damage over years. Many people who are infected do not know that the virus is in their blood. If you are a "baby-boomer", it is recommended that you have one screening test for Hepatitis C. IMMUNIZATIONS  Several immunizations are important to consider having during your senior years, including:   Tetanus, diptheria, and pertussis booster shot.  Influenza every year before the flu season begins.  Pneumonia vaccine.  Shingles vaccine.  Others as indicated based on your specific needs. Talk to your caregiver about these. Document Released: 07/31/2005 Document Revised: 05/25/2012 Document Reviewed: 03/26/2008 [employer] Patient Information 2014 [employer].  

## 2013-07-20 DIAGNOSIS — M999 Biomechanical lesion, unspecified: Secondary | ICD-10-CM | POA: Diagnosis not present

## 2013-07-20 DIAGNOSIS — M47814 Spondylosis without myelopathy or radiculopathy, thoracic region: Secondary | ICD-10-CM | POA: Diagnosis not present

## 2013-07-20 DIAGNOSIS — M9981 Other biomechanical lesions of cervical region: Secondary | ICD-10-CM | POA: Diagnosis not present

## 2013-07-20 DIAGNOSIS — M47812 Spondylosis without myelopathy or radiculopathy, cervical region: Secondary | ICD-10-CM | POA: Diagnosis not present

## 2013-07-25 DIAGNOSIS — M47812 Spondylosis without myelopathy or radiculopathy, cervical region: Secondary | ICD-10-CM | POA: Diagnosis not present

## 2013-07-25 DIAGNOSIS — M999 Biomechanical lesion, unspecified: Secondary | ICD-10-CM | POA: Diagnosis not present

## 2013-07-25 DIAGNOSIS — M47814 Spondylosis without myelopathy or radiculopathy, thoracic region: Secondary | ICD-10-CM | POA: Diagnosis not present

## 2013-07-25 DIAGNOSIS — M9981 Other biomechanical lesions of cervical region: Secondary | ICD-10-CM | POA: Diagnosis not present

## 2013-07-27 DIAGNOSIS — M9981 Other biomechanical lesions of cervical region: Secondary | ICD-10-CM | POA: Diagnosis not present

## 2013-07-27 DIAGNOSIS — M47814 Spondylosis without myelopathy or radiculopathy, thoracic region: Secondary | ICD-10-CM | POA: Diagnosis not present

## 2013-07-27 DIAGNOSIS — M47812 Spondylosis without myelopathy or radiculopathy, cervical region: Secondary | ICD-10-CM | POA: Diagnosis not present

## 2013-07-27 DIAGNOSIS — M999 Biomechanical lesion, unspecified: Secondary | ICD-10-CM | POA: Diagnosis not present

## 2013-08-03 ENCOUNTER — Other Ambulatory Visit: Payer: Self-pay | Admitting: Family Medicine

## 2013-08-03 ENCOUNTER — Telehealth: Payer: Self-pay | Admitting: Family Medicine

## 2013-08-03 DIAGNOSIS — M47814 Spondylosis without myelopathy or radiculopathy, thoracic region: Secondary | ICD-10-CM | POA: Diagnosis not present

## 2013-08-03 DIAGNOSIS — M47812 Spondylosis without myelopathy or radiculopathy, cervical region: Secondary | ICD-10-CM | POA: Diagnosis not present

## 2013-08-03 DIAGNOSIS — M25869 Other specified joint disorders, unspecified knee: Secondary | ICD-10-CM

## 2013-08-03 DIAGNOSIS — M999 Biomechanical lesion, unspecified: Secondary | ICD-10-CM | POA: Diagnosis not present

## 2013-08-03 DIAGNOSIS — M9981 Other biomechanical lesions of cervical region: Secondary | ICD-10-CM | POA: Diagnosis not present

## 2013-08-03 NOTE — Telephone Encounter (Signed)
Last visit 07/06/13 Last refill 06/16/13 #30 0 refill

## 2013-08-03 NOTE — Telephone Encounter (Signed)
Set up appointment with Dr.Alucio

## 2013-08-03 NOTE — Telephone Encounter (Signed)
Pt states her knee is not better and cyst has returned. Pt instructed ortho referral would be needed if that is what Dr [name] would still prefer to do. Dr Elmyra Ricks is their famliy ortho

## 2013-08-03 NOTE — Telephone Encounter (Signed)
Refill with 2 additional refills. 

## 2013-08-03 NOTE — Telephone Encounter (Signed)
Referral is ordered

## 2013-08-03 NOTE — Telephone Encounter (Signed)
Pt is aware waiting on md for ambien refill

## 2013-08-09 DIAGNOSIS — M47814 Spondylosis without myelopathy or radiculopathy, thoracic region: Secondary | ICD-10-CM | POA: Diagnosis not present

## 2013-08-09 DIAGNOSIS — M9981 Other biomechanical lesions of cervical region: Secondary | ICD-10-CM | POA: Diagnosis not present

## 2013-08-09 DIAGNOSIS — M47812 Spondylosis without myelopathy or radiculopathy, cervical region: Secondary | ICD-10-CM | POA: Diagnosis not present

## 2013-08-09 DIAGNOSIS — M999 Biomechanical lesion, unspecified: Secondary | ICD-10-CM | POA: Diagnosis not present

## 2013-08-16 ENCOUNTER — Other Ambulatory Visit: Payer: Self-pay | Admitting: Gynecology

## 2013-08-16 DIAGNOSIS — M8580 Other specified disorders of bone density and structure, unspecified site: Secondary | ICD-10-CM

## 2013-08-21 ENCOUNTER — Other Ambulatory Visit: Payer: Self-pay | Admitting: Dermatology

## 2013-08-21 DIAGNOSIS — C4360 Malignant melanoma of unspecified upper limb, including shoulder: Secondary | ICD-10-CM | POA: Diagnosis not present

## 2013-08-21 DIAGNOSIS — C4359 Malignant melanoma of other part of trunk: Secondary | ICD-10-CM | POA: Diagnosis not present

## 2013-08-22 DIAGNOSIS — M47814 Spondylosis without myelopathy or radiculopathy, thoracic region: Secondary | ICD-10-CM | POA: Diagnosis not present

## 2013-08-22 DIAGNOSIS — M47812 Spondylosis without myelopathy or radiculopathy, cervical region: Secondary | ICD-10-CM | POA: Diagnosis not present

## 2013-08-22 DIAGNOSIS — M999 Biomechanical lesion, unspecified: Secondary | ICD-10-CM | POA: Diagnosis not present

## 2013-08-22 DIAGNOSIS — M9981 Other biomechanical lesions of cervical region: Secondary | ICD-10-CM | POA: Diagnosis not present

## 2013-08-31 ENCOUNTER — Ambulatory Visit (INDEPENDENT_AMBULATORY_CARE_PROVIDER_SITE_OTHER): Payer: Medicare Other | Admitting: Surgery

## 2013-08-31 ENCOUNTER — Other Ambulatory Visit (INDEPENDENT_AMBULATORY_CARE_PROVIDER_SITE_OTHER): Payer: Self-pay

## 2013-08-31 ENCOUNTER — Encounter (INDEPENDENT_AMBULATORY_CARE_PROVIDER_SITE_OTHER): Payer: Self-pay | Admitting: Surgery

## 2013-08-31 VITALS — BP 130/74 | HR 68 | Temp 98.2°F | Resp 16 | Ht 69.0 in | Wt 138.0 lb

## 2013-08-31 DIAGNOSIS — C4360 Malignant melanoma of unspecified upper limb, including shoulder: Secondary | ICD-10-CM | POA: Diagnosis not present

## 2013-08-31 NOTE — Progress Notes (Addendum)
Re:   [name] DOB:   12-Dec-1945 MRN:   [id number]  ASSESSMENT AND PLAN: 1.  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  The margins are positive on the path, so this will need a wider excision.  I gave the patient a copy of the path report.  I also talked to her about a sentinel lymph node biopsy.  I will do lymphoscintigraphy first to see the nodal anatomy.  I discussed the risk of surgery including, but not limited to bleeding, infection, nerve injury and the need for further surgery.  Plan:  1) Lymphscintigraphy for node mapping, 2) wide excision right forearm and Sentinel lymph node excision  [The lymphoscintigraphy did not localize a node.  Will go ahead with wide excision and see if I find a sentinel node.  I spoke to Ms. Andujar husband.  DN  09/13/2013] 2.  Anxiety when she flies. 3.  MVP/mitral regurgitation  She sees Dr. Aundra Dubin  Chief Complaint  Patient presents with  . Melanoma    rt forearm   REFERRING PHYSICIAN:  [name]  HISTORY OF PRESENT ILLNESS: [name] is a 68 y.o. (DOB: 1946-02-22)   female whose primary care physician is [name], MD and comes to me today for evaluation of melanoma. She comes with her husband.  She had a skin lesion that she noticed on her right forearm for some time, maybe greater than a year.  Because of changes in the lesion, she underwent a shave biopsy of the right forearm lesion by [name] on 08/21/2013.  Path report -  Accession: [id number] - 2.1 mm thick, Clark's level IV, margins positive. She has had no other history of skin cancer and there is no melanoma in her family.   Past Medical History  Diagnosis Date  . ANXIETY 02/19/2009  . INSOMNIA, CHRONIC 03/29/2009  . Migraine   . Heart murmur   . Skin cancer of arm     Squamous  . MVP (mitral valve prolapse)     NO MEDS  . Thyroid disease   . Osteopenia 2006      Past Surgical  History  Procedure Laterality Date  . Appendectomy  1969  . Tonsillectomy  1980  . Abdominal hysterectomy  1980    WITH BSO/FIBROIDS  . Oophorectomy      BSO WITH ABDOMINAL HYSTERECTOMY  . Thyroid cyst excision  05-01-11    RIGHT      Current Outpatient Prescriptions  Medication Sig Dispense Refill  . zolpidem (AMBIEN) 10 MG tablet TAKE 1 TABLET BY MOUTH EVERY DAY AT BEDTIME  30 tablet  2  . ALPRAZolam (XANAX) 0.5 MG tablet TAKE 1 TABLET BY MOUTH EVERY 8 HOURS AS NEEDED FOR ANXIETY  30 tablet  0  . aspirin 81 MG tablet Take 81 mg by mouth daily.        . mometasone (NASONEX) 50 MCG/ACT nasal spray Place 2 sprays into the nose daily.  17 g  3  . nystatin cream (MYCOSTATIN) Apply 1 application topically 2 (two) times daily.  30 g  1   No current facility-administered medications for this visit.      Allergies  Allergen Reactions  . Inderal [Propranolol Hcl]     SEVERE LIP AND SKIN DRYNESS  . Latex Itching    Sensitive to latex    REVIEW OF SYSTEMS: Skin:  No history of rash.  No history of abnormal moles. Infection:  No history of hepatitis or HIV.  No history of MRSA. Neurologic:  No history of stroke.  No history of seizure.  No history of headaches. Cardiac:  Has seen Dr. [name] for palpitations.  She tried Beta blockers, but these caused a  rash, so she is on no meds. Pulmonary:  Has seen Dr. [name] for lung nodules.  She gets an annual CT scan.  These have been stable.  Endocrine:  No diabetes. No thyroid disease. Gastrointestinal:  No history of stomach disease.  No history of liver disease.  No history of gall bladder disease.  No history of pancreas disease.  No history of colon disease. Urologic:  No history of kidney stones.  No history of bladder infections. GYN:  Sees [name], NP. (I think that she meant [name])  Unsure of primary GYN doctor. Musculoskeletal:  Right rotator cuff surgery in 2012 by Dr. [name].  She plays tennis. Hematologic:  No  bleeding disorder.  No history of anemia.  Not anticoagulated. Psycho-social:  The patient is oriented.   The patient has no obvious psychologic or social impairment to understanding our conversation and plan.  SOCIAL and FAMILY HISTORY: Married. Husband with her. She is not working. She has 3 sons:  One in [place], one in [place], and one in [place].  It sounds like her husband brought children to the marriage.  PHYSICAL EXAM: BP 130/74  Pulse 68  Temp(Src) 98.2 F (36.8 C) (Oral)  Resp 16  Ht 5\' 9"  (1.753 m)  Wt 138 lb (62.596 kg)  BMI 20.37 kg/m2  LMP 06/22/1977  General: Thinner WN WF who is alert and generally healthy appearing.  HEENT: Normal. Pupils equal. Neck: Supple. No mass.  No thyroid mass. Lymph Nodes:  No supraclavicular, cervical, axillary or inguinal nodes. Lungs: Clear to auscultation and symmetric breath sounds. Heart:  RRR. No murmur or rub. Abdomen: Soft. No mass. No tenderness. No hernia. Normal bowel sounds.  Pfannenstiel incision.  No mass. Rectal: Not done. Extremities:  Good strength and ROM  in upper and lower extremities.  She has a 1 cm healing incision on her right mid forearm.  Neurologic:  Grossly intact to motor and sensory function. Psychiatric: Has normal mood and affect. Behavior is normal.     Right forearm.  DATA REVIEWED: Path report and Epic notes.  [name], MD,  [facility], [address]  [place], [place]    [place] Phone:  [phone] FAX:  [phone]

## 2013-09-01 ENCOUNTER — Other Ambulatory Visit (INDEPENDENT_AMBULATORY_CARE_PROVIDER_SITE_OTHER): Payer: Self-pay

## 2013-09-01 ENCOUNTER — Telehealth (INDEPENDENT_AMBULATORY_CARE_PROVIDER_SITE_OTHER): Payer: Self-pay

## 2013-09-01 DIAGNOSIS — C439 Malignant melanoma of skin, unspecified: Secondary | ICD-10-CM

## 2013-09-01 NOTE — Telephone Encounter (Signed)
Patient aware of appt @ [facility] Med 09-05-13@ 945 for a 10:15 procedure

## 2013-09-04 DIAGNOSIS — M6789 Other specified disorders of synovium and tendon, multiple sites: Secondary | ICD-10-CM | POA: Diagnosis not present

## 2013-09-07 ENCOUNTER — Encounter (HOSPITAL_COMMUNITY)
Admission: RE | Admit: 2013-09-07 | Discharge: 2013-09-07 | Disposition: A | Discharge status: Home / Self Care | Payer: Medicare Other | Source: Ambulatory Visit | Attending: Surgery | Admitting: Surgery

## 2013-09-07 DIAGNOSIS — C439 Malignant melanoma of skin, unspecified: Secondary | ICD-10-CM | POA: Insufficient documentation

## 2013-09-07 MED ORDER — TECHNETIUM TC 99M SULFUR COLLOID FILTERED
500.0000 | Freq: Once | INTRAVENOUS | Status: AC | PRN
  Administered 2013-09-07: 500 via INTRADERMAL

## 2013-09-13 ENCOUNTER — Other Ambulatory Visit (INDEPENDENT_AMBULATORY_CARE_PROVIDER_SITE_OTHER): Payer: Self-pay | Admitting: Surgery

## 2013-09-13 DIAGNOSIS — M6789 Other specified disorders of synovium and tendon, multiple sites: Secondary | ICD-10-CM | POA: Diagnosis not present

## 2013-09-13 DIAGNOSIS — C439 Malignant melanoma of skin, unspecified: Secondary | ICD-10-CM

## 2013-09-14 ENCOUNTER — Encounter: Payer: Self-pay | Admitting: Cardiology

## 2013-09-27 ENCOUNTER — Encounter (HOSPITAL_BASED_OUTPATIENT_CLINIC_OR_DEPARTMENT_OTHER): Payer: Self-pay | Admitting: *Deleted

## 2013-09-27 NOTE — Progress Notes (Signed)
No labs needed-sees dr Aundra Dubin for mild mv reg

## 2013-09-28 DIAGNOSIS — M6789 Other specified disorders of synovium and tendon, multiple sites: Secondary | ICD-10-CM | POA: Diagnosis not present

## 2013-10-02 ENCOUNTER — Encounter (HOSPITAL_BASED_OUTPATIENT_CLINIC_OR_DEPARTMENT_OTHER)
Admission: RE | Disposition: A | Discharge status: Home / Self Care | Payer: Self-pay | Source: Ambulatory Visit | Attending: Surgery

## 2013-10-02 ENCOUNTER — Encounter (HOSPITAL_COMMUNITY)
Admission: RE | Admit: 2013-10-02 | Discharge: 2013-10-02 | Disposition: A | Discharge status: Home / Self Care | Payer: Medicare Other | Source: Ambulatory Visit | Attending: Surgery | Admitting: Surgery

## 2013-10-02 ENCOUNTER — Encounter (HOSPITAL_BASED_OUTPATIENT_CLINIC_OR_DEPARTMENT_OTHER): Payer: Medicare Other | Admitting: Anesthesiology

## 2013-10-02 ENCOUNTER — Ambulatory Visit (HOSPITAL_BASED_OUTPATIENT_CLINIC_OR_DEPARTMENT_OTHER)
Admission: RE | Admit: 2013-10-02 | Discharge: 2013-10-02 | Disposition: A | Discharge status: Home / Self Care | Payer: Medicare Other | Source: Ambulatory Visit | Attending: Surgery | Admitting: Surgery

## 2013-10-02 ENCOUNTER — Encounter (HOSPITAL_BASED_OUTPATIENT_CLINIC_OR_DEPARTMENT_OTHER): Payer: Self-pay | Admitting: *Deleted

## 2013-10-02 ENCOUNTER — Ambulatory Visit (HOSPITAL_BASED_OUTPATIENT_CLINIC_OR_DEPARTMENT_OTHER): Payer: Medicare Other | Admitting: Anesthesiology

## 2013-10-02 ENCOUNTER — Encounter (HOSPITAL_BASED_OUTPATIENT_CLINIC_OR_DEPARTMENT_OTHER): Payer: Self-pay

## 2013-10-02 ENCOUNTER — Ambulatory Visit (HOSPITAL_BASED_OUTPATIENT_CLINIC_OR_DEPARTMENT_OTHER): Admit: 2013-10-02 | Payer: Self-pay | Admitting: Otolaryngology

## 2013-10-02 ENCOUNTER — Encounter: Payer: Medicare Other | Admitting: Family Medicine

## 2013-10-02 DIAGNOSIS — G43909 Migraine, unspecified, not intractable, without status migrainosus: Secondary | ICD-10-CM | POA: Diagnosis not present

## 2013-10-02 DIAGNOSIS — M899 Disorder of bone, unspecified: Secondary | ICD-10-CM | POA: Diagnosis not present

## 2013-10-02 DIAGNOSIS — D2360 Other benign neoplasm of skin of unspecified upper limb, including shoulder: Secondary | ICD-10-CM | POA: Diagnosis not present

## 2013-10-02 DIAGNOSIS — E079 Disorder of thyroid, unspecified: Secondary | ICD-10-CM | POA: Insufficient documentation

## 2013-10-02 DIAGNOSIS — Z7982 Long term (current) use of aspirin: Secondary | ICD-10-CM | POA: Insufficient documentation

## 2013-10-02 DIAGNOSIS — M949 Disorder of cartilage, unspecified: Secondary | ICD-10-CM | POA: Diagnosis not present

## 2013-10-02 DIAGNOSIS — G4700 Insomnia, unspecified: Secondary | ICD-10-CM | POA: Insufficient documentation

## 2013-10-02 DIAGNOSIS — F411 Generalized anxiety disorder: Secondary | ICD-10-CM | POA: Insufficient documentation

## 2013-10-02 DIAGNOSIS — C439 Malignant melanoma of skin, unspecified: Secondary | ICD-10-CM

## 2013-10-02 DIAGNOSIS — C4360 Malignant melanoma of unspecified upper limb, including shoulder: Secondary | ICD-10-CM | POA: Insufficient documentation

## 2013-10-02 DIAGNOSIS — I059 Rheumatic mitral valve disease, unspecified: Secondary | ICD-10-CM | POA: Diagnosis not present

## 2013-10-02 DIAGNOSIS — D360 Benign neoplasm of lymph nodes: Secondary | ICD-10-CM | POA: Diagnosis not present

## 2013-10-02 HISTORY — PX: EXCISION MELANOMA WITH SENTINEL LYMPH NODE BIOPSY: SHX5628

## 2013-10-02 HISTORY — DX: Other specified postprocedural states: R11.2

## 2013-10-02 HISTORY — DX: Other specified postprocedural states: Z98.890

## 2013-10-02 LAB — POCT HEMOGLOBIN-HEMACUE: Hemoglobin: 12.6 g/dL (ref 12.0–15.0)

## 2013-10-02 SURGERY — LYMPH NODE BIOPSY
Anesthesia: General | Laterality: Right

## 2013-10-02 SURGERY — EXCISION, MELANOMA, WITH SENTINEL LYMPH NODE BIOPSY
Anesthesia: General | Site: Arm Lower | Laterality: Right

## 2013-10-02 MED ORDER — BUPIVACAINE HCL (PF) 0.25 % IJ SOLN
INTRAMUSCULAR | Status: DC | PRN
  Administered 2013-10-02: 5 mL
  Administered 2013-10-02: 10 mL

## 2013-10-02 MED ORDER — MIDAZOLAM HCL 5 MG/5ML IJ SOLN
INTRAMUSCULAR | Status: DC | PRN
  Administered 2013-10-02: 2 mg via INTRAVENOUS

## 2013-10-02 MED ORDER — ONDANSETRON HCL 4 MG/2ML IJ SOLN
INTRAMUSCULAR | Status: DC | PRN
  Administered 2013-10-02: 4 mg via INTRAVENOUS

## 2013-10-02 MED ORDER — MIDAZOLAM HCL 2 MG/2ML IJ SOLN
INTRAMUSCULAR | Status: AC
  Filled 2013-10-02: qty 2

## 2013-10-02 MED ORDER — PROMETHAZINE HCL 25 MG/ML IJ SOLN
INTRAMUSCULAR | Status: AC
Start: 2013-10-02 — End: 2013-10-02
  Filled 2013-10-02: qty 1

## 2013-10-02 MED ORDER — CHLORHEXIDINE GLUCONATE 4 % EX LIQD: 1.0000 "application " | Freq: Once | CUTANEOUS | Status: DC

## 2013-10-02 MED ORDER — FENTANYL CITRATE 0.05 MG/ML IJ SOLN
INTRAMUSCULAR | Status: AC
  Filled 2013-10-02: qty 2

## 2013-10-02 MED ORDER — PROMETHAZINE HCL 25 MG/ML IJ SOLN
6.2500 mg | INTRAMUSCULAR | Status: DC | PRN
  Administered 2013-10-02: 6.25 mg via INTRAVENOUS

## 2013-10-02 MED ORDER — LACTATED RINGERS IV SOLN
INTRAVENOUS | Status: DC
  Administered 2013-10-02: 13:00:00 via INTRAVENOUS

## 2013-10-02 MED ORDER — HYDROMORPHONE HCL PF 1 MG/ML IJ SOLN: 0.2500 mg | INTRAMUSCULAR | Status: DC | PRN

## 2013-10-02 MED ORDER — FENTANYL CITRATE 0.05 MG/ML IJ SOLN
INTRAMUSCULAR | Status: AC
  Filled 2013-10-02: qty 6

## 2013-10-02 MED ORDER — BUPIVACAINE HCL (PF) 0.25 % IJ SOLN
INTRAMUSCULAR | Status: AC
  Filled 2013-10-02: qty 30

## 2013-10-02 MED ORDER — HYDROCODONE-ACETAMINOPHEN 5-325 MG PO TABS: 1.0000 | ORAL_TABLET | Freq: Four times a day (QID) | ORAL | Status: DC | PRN

## 2013-10-02 MED ORDER — MIDAZOLAM HCL 2 MG/2ML IJ SOLN
1.0000 mg | INTRAMUSCULAR | Status: DC | PRN
  Administered 2013-10-02: 1 mg via INTRAVENOUS

## 2013-10-02 MED ORDER — LIDOCAINE HCL (CARDIAC) 20 MG/ML IV SOLN
INTRAVENOUS | Status: DC | PRN
  Administered 2013-10-02: 60 mg via INTRAVENOUS

## 2013-10-02 MED ORDER — DEXAMETHASONE SODIUM PHOSPHATE 4 MG/ML IJ SOLN
INTRAMUSCULAR | Status: DC | PRN
  Administered 2013-10-02: 5 mg via INTRAVENOUS

## 2013-10-02 MED ORDER — PROPOFOL 10 MG/ML IV BOLUS: INTRAVENOUS | Status: DC | PRN

## 2013-10-02 MED ORDER — OXYCODONE HCL 5 MG PO TABS: 5.0000 mg | ORAL_TABLET | Freq: Once | ORAL | Status: DC | PRN

## 2013-10-02 MED ORDER — FENTANYL CITRATE 0.05 MG/ML IJ SOLN
50.0000 ug | INTRAMUSCULAR | Status: DC | PRN
  Administered 2013-10-02: 100 ug via INTRAVENOUS

## 2013-10-02 MED ORDER — PROPOFOL 10 MG/ML IV BOLUS
INTRAVENOUS | Status: DC | PRN
  Administered 2013-10-02: 140 mg via INTRAVENOUS

## 2013-10-02 MED ORDER — PROPOFOL 10 MG/ML IV BOLUS
INTRAVENOUS | Status: AC
  Filled 2013-10-02: qty 20

## 2013-10-02 MED ORDER — BACITRACIN-NEOMYCIN-POLYMYXIN 400-5-5000 EX OINT
TOPICAL_OINTMENT | CUTANEOUS | Status: AC
  Filled 2013-10-02: qty 1

## 2013-10-02 MED ORDER — SCOPOLAMINE 1 MG/3DAYS TD PT72
MEDICATED_PATCH | TRANSDERMAL | Status: AC
  Filled 2013-10-02: qty 1

## 2013-10-02 MED ORDER — FENTANYL CITRATE 0.05 MG/ML IJ SOLN
INTRAMUSCULAR | Status: DC | PRN
  Administered 2013-10-02 (×3): 50 ug via INTRAVENOUS

## 2013-10-02 MED ORDER — TECHNETIUM TC 99M SULFUR COLLOID FILTERED
0.5000 | Freq: Once | INTRAVENOUS | Status: AC | PRN
  Administered 2013-10-02: 0.5 via INTRADERMAL

## 2013-10-02 MED ORDER — SCOPOLAMINE 1 MG/3DAYS TD PT72
1.0000 | MEDICATED_PATCH | Freq: Once | TRANSDERMAL | Status: DC
  Administered 2013-10-02: 1.5 mg via TRANSDERMAL

## 2013-10-02 MED ORDER — OXYCODONE HCL 5 MG/5ML PO SOLN: 5.0000 mg | Freq: Once | ORAL | Status: DC | PRN

## 2013-10-02 MED ORDER — ISOSULFAN BLUE 1 % ~~LOC~~ SOLN
SUBCUTANEOUS | Status: DC | PRN
  Administered 2013-10-02: 0.5 mL via SUBCUTANEOUS

## 2013-10-02 SURGICAL SUPPLY — 63 items
ADH SKN CLS APL DERMABOND .7 (GAUZE/BANDAGES/DRESSINGS) ×1
APPLIER CLIP 11 MED OPEN (CLIP)
APR CLP MED 11 20 MLT OPN (CLIP)
BANDAGE ELASTIC 4 VELCRO ST LF (GAUZE/BANDAGES/DRESSINGS) ×2 IMPLANT
BLADE HEX COATED 2.75 (ELECTRODE) ×3 IMPLANT
BLADE SURG 10 STRL SS (BLADE) ×3 IMPLANT
BLADE SURG 15 STRL LF DISP TIS (BLADE) ×1 IMPLANT
BLADE SURG 15 STRL SS (BLADE) ×3
BNDG COHESIVE 4X5 TAN STRL (GAUZE/BANDAGES/DRESSINGS) ×2 IMPLANT
CANISTER SUCT 1200ML W/VALVE (MISCELLANEOUS) ×3 IMPLANT
CHLORAPREP W/TINT 26ML (MISCELLANEOUS) ×3 IMPLANT
CLIP APPLIE 11 MED OPEN (CLIP) IMPLANT
CLIP TI WIDE RED SMALL 6 (CLIP) IMPLANT
COVER MAYO STAND STRL (DRAPES) ×3 IMPLANT
COVER PROBE W GEL 5X96 (DRAPES) ×3 IMPLANT
COVER TABLE BACK 60X90 (DRAPES) ×3 IMPLANT
DECANTER SPIKE VIAL GLASS SM (MISCELLANEOUS) ×3 IMPLANT
DERMABOND ADVANCED (GAUZE/BANDAGES/DRESSINGS) ×2
DERMABOND ADVANCED .7 DNX12 (GAUZE/BANDAGES/DRESSINGS) ×1 IMPLANT
DRAPE LAPAROSCOPIC ABDOMINAL (DRAPES) ×2 IMPLANT
DRAPE PED LAPAROTOMY (DRAPES) IMPLANT
DRAPE STERI 35X30 U-POUCH (DRAPES) ×2 IMPLANT
DRAPE UTILITY XL STRL (DRAPES) ×3 IMPLANT
ELECT COATED BLADE 2.86 ST (ELECTRODE) ×1 IMPLANT
ELECT REM PT RETURN 9FT ADLT (ELECTROSURGICAL) ×3
ELECTRODE REM PT RTRN 9FT ADLT (ELECTROSURGICAL) ×1 IMPLANT
GAUZE SPONGE 4X4 16PLY XRAY LF (GAUZE/BANDAGES/DRESSINGS) ×3 IMPLANT
GLOVE BIOGEL PI IND STRL 7.0 (GLOVE) IMPLANT
GLOVE BIOGEL PI IND STRL 8 (GLOVE) IMPLANT
GLOVE BIOGEL PI INDICATOR 7.0 (GLOVE) ×2
GLOVE BIOGEL PI INDICATOR 8 (GLOVE) ×2
GLOVE SURG SIGNA 7.5 PF LTX (GLOVE) ×1 IMPLANT
GLOVE SURG SS PI 7.5 STRL IVOR (GLOVE) ×4 IMPLANT
GLOVE SURG SS PI 8.0 STRL IVOR (GLOVE) ×2 IMPLANT
GOWN STRL REUS W/ TWL LRG LVL3 (GOWN DISPOSABLE) ×1 IMPLANT
GOWN STRL REUS W/ TWL XL LVL3 (GOWN DISPOSABLE) ×1 IMPLANT
GOWN STRL REUS W/TWL LRG LVL3 (GOWN DISPOSABLE) ×3
GOWN STRL REUS W/TWL XL LVL3 (GOWN DISPOSABLE) ×3
NEEDLE HYPO 25X1 1.5 SAFETY (NEEDLE) ×3 IMPLANT
NS IRRIG 1000ML POUR BTL (IV SOLUTION) ×3 IMPLANT
PACK BASIN DAY SURGERY FS (CUSTOM PROCEDURE TRAY) ×3 IMPLANT
PAD ALCOHOL SWAB (MISCELLANEOUS) ×3 IMPLANT
PENCIL BUTTON HOLSTER BLD 10FT (ELECTRODE) ×3 IMPLANT
SHEET MEDIUM DRAPE 40X70 STRL (DRAPES) ×2 IMPLANT
SLEEVE SCD COMPRESS KNEE MED (MISCELLANEOUS) ×3 IMPLANT
SPONGE GAUZE 4X4 12PLY (GAUZE/BANDAGES/DRESSINGS) IMPLANT
SPONGE GAUZE 4X4 12PLY STER LF (GAUZE/BANDAGES/DRESSINGS) ×2 IMPLANT
SPONGE LAP 18X18 X RAY DECT (DISPOSABLE) IMPLANT
STAPLER VISISTAT 35W (STAPLE) IMPLANT
STOCKINETTE IMPERVIOUS LG (DRAPES) ×2 IMPLANT
SUT ETHILON 2 0 FS 18 (SUTURE) IMPLANT
SUT ETHILON 3 0 PS 1 (SUTURE) ×4 IMPLANT
SUT MON AB 5-0 PS2 18 (SUTURE) ×3 IMPLANT
SUT SILK 3 0 TIES 17X18 (SUTURE)
SUT SILK 3-0 18XBRD TIE BLK (SUTURE) IMPLANT
SUT VICRYL 3-0 CR8 SH (SUTURE) ×3 IMPLANT
SYR CONTROL 10ML LL (SYRINGE) ×3 IMPLANT
SYR TB 1ML 25GX5/8 (SYRINGE) ×3 IMPLANT
TOWEL OR 17X24 6PK STRL BLUE (TOWEL DISPOSABLE) ×3 IMPLANT
TOWEL OR NON WOVEN STRL DISP B (DISPOSABLE) ×3 IMPLANT
TUBE CONNECTING 20'X1/4 (TUBING) ×1
TUBE CONNECTING 20X1/4 (TUBING) ×2 IMPLANT
YANKAUER SUCT BULB TIP NO VENT (SUCTIONS) ×3 IMPLANT

## 2013-10-02 NOTE — Discharge Instructions (Signed)
[facility] - DISCHARGE INSTRUCTIONS TO PATIENT  Activity:  Driving - Tomorrow, if doing well.   Lifting - Take it easy for 5 days, then no limits  Wound Care:   Leave bandage on for 2 days.  Then may remove and shower.  Rewrap the arm with the ACE wrap each day, until seen in the office.  Diet:  As tolerated.  Follow up appointment:  Call Dr. Pollie Friar office [facility]) at [phone] for an appointment in 10-14 days.  Medications and dosages:  Resume your home medications.  You have a prescription for:  Vicodin  Call Dr. Lucia Gaskins or his office  [phone]) if you have:  Temperature greater than 100.4,  Persistent nausea and vomiting,  Severe uncontrolled pain,  Redness, tenderness, or signs of infection (pain, swelling, redness, odor or green/yellow discharge around the site),  Difficulty breathing, headache or visual disturbances,  Any other questions or concerns you may have after discharge.  In an emergency, call 911 or go to an Emergency Department at a nearby hospital.    Post Anesthesia Home Care Instructions  Activity: Get plenty of rest for the remainder of the day. A responsible adult should stay with you for 24 hours following the procedure.  For the next 24 hours, DO NOT: -Drive a car -Paediatric nurse -Drink alcoholic beverages -Take any medication unless instructed by your physician -Make any legal decisions or sign important papers.  Meals: Start with liquid foods such as gelatin or soup. Progress to regular foods as tolerated. Avoid greasy, spicy, heavy foods. If nausea and/or vomiting occur, drink only clear liquids until the nausea and/or vomiting subsides. Call your physician if vomiting continues.  Special Instructions/Symptoms: Your throat may feel dry or sore from the anesthesia or the breathing tube placed in your throat during surgery. If this causes discomfort, gargle with warm salt water. The discomfort should disappear  within 24 hours.   Call your surgeon if you experience:   1.  Fever over 101.0. 2.  Inability to urinate. 3.  Nausea and/or vomiting. 4.  Extreme swelling or bruising at the surgical site. 5.  Continued bleeding from the incision. 6.  Increased pain, redness or drainage from the incision. 7.  Problems related to your pain medication.

## 2013-10-02 NOTE — Anesthesia Preprocedure Evaluation (Signed)
Anesthesia Evaluation  Patient identified by MRN, date of birth, ID band Patient awake    Reviewed: Allergy & Precautions, H&P , NPO status , Patient's Chart, lab work & pertinent test results  History of Anesthesia Complications (+) PONV  Airway Mallampati: II TM Distance: >3 FB Neck ROM: Full    Dental no notable dental hx. (+) Teeth Intact, Dental Advisory Given   Pulmonary neg pulmonary ROS, former smoker,  breath sounds clear to auscultation  Pulmonary exam normal       Cardiovascular + Valvular Problems/Murmurs MR Rhythm:Regular Rate:Normal     Neuro/Psych  Headaches, Anxiety negative psych ROS   GI/Hepatic negative GI ROS, Neg liver ROS,   Endo/Other  negative endocrine ROS  Renal/GU negative Renal ROS  negative genitourinary   Musculoskeletal   Abdominal   Peds  Hematology negative hematology ROS (+)   Anesthesia Other Findings   Reproductive/Obstetrics negative OB ROS                           Anesthesia Physical Anesthesia Plan  ASA: II  Anesthesia Plan: General   Post-op Pain Management:    Induction: Intravenous  Airway Management Planned: LMA  Additional Equipment:   Intra-op Plan:   Post-operative Plan: Extubation in OR  Informed Consent: I have reviewed the patients History and Physical, chart, labs and discussed the procedure including the risks, benefits and alternatives for the proposed anesthesia with the patient or authorized representative who has indicated his/her understanding and acceptance.   Dental advisory given  Plan Discussed with: CRNA  Anesthesia Plan Comments:         Anesthesia Quick Evaluation

## 2013-10-02 NOTE — H&P (Signed)
Re: [name]  DOB: September 17, 1945  MRN: [id number]    ASSESSMENT AND PLAN:  1. 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 The margins are positive on the path, so this will need a wider excision. I gave the patient a copy of the path report.   I also talked to her about a sentinel lymph node biopsy. I will do lymphoscintigraphy first to see the nodal anatomy. I discussed the risk of surgery including, but not limited to bleeding, infection, nerve injury and the need for further surgery.   Plan: 1) Lymphscintigraphy for node mapping, 2) wide excision right forearm and Sentinel lymph node excision  [The lymphoscintigraphy did not localize a node. Will go ahead with wide excision and see if I find a sentinel node. I spoke to Ms. Cozine husband. DN 09/13/2013]   Husband at the bedside.  2. Anxiety when she flies.  3. MVP/mitral regurgitation   She sees Dr. Aundra Dubin   Chief Complaint   Patient presents with   .  Melanoma     rt forearm    REFERRING PHYSICIAN: [name]   HISTORY OF PRESENT ILLNESS:  [name] is a 68 y.o. (DOB: [birth year]) female whose primary care physician is [name], MD and comes to me today for evaluation of melanoma.  She comes with her husband.  She had a skin lesion that she noticed on her right forearm for some time, maybe greater than a year. Because of changes in the lesion, she underwent a shave biopsy of the right forearm lesion by [name] on 08/21/2013. Path report - Accession: [id number] - 2.1 mm thick, Clark's level IV, margins positive.  She has had no other history of skin cancer and there is no melanoma in her family.   Past Medical History   Diagnosis  Date   .  ANXIETY  02/19/2009   .  INSOMNIA, CHRONIC  03/29/2009   .  Migraine    .  Heart murmur    .  Skin cancer of arm      Squamous   .  MVP (mitral valve prolapse)      NO MEDS   .  Thyroid disease    .   Osteopenia  2006    Past Surgical History   Procedure  Laterality  Date   .  Appendectomy   1969   .  Tonsillectomy   1980   .  Abdominal hysterectomy   1980     WITH BSO/FIBROIDS   .  Oophorectomy       BSO WITH ABDOMINAL HYSTERECTOMY   .  Thyroid cyst excision   05-01-11     RIGHT    Current Outpatient Prescriptions   Medication  Sig  Dispense  Refill   .  zolpidem (AMBIEN) 10 MG tablet  TAKE 1 TABLET BY MOUTH EVERY DAY AT BEDTIME  30 tablet  2   .  ALPRAZolam (XANAX) 0.5 MG tablet  TAKE 1 TABLET BY MOUTH EVERY 8 HOURS AS NEEDED FOR ANXIETY  30 tablet  0   .  aspirin 81 MG tablet  Take 81 mg by mouth daily.     .  mometasone (NASONEX) 50 MCG/ACT nasal spray  Place 2 sprays into the nose daily.  17 g  3   .  nystatin cream (MYCOSTATIN)  Apply 1 application topically 2 (two) times daily.  30 g  1    No current facility-administered medications for this visit.    Allergies   Allergen  Reactions   .  Inderal [Propranolol Hcl]      SEVERE LIP AND SKIN DRYNESS   .  Latex  Itching     Sensitive to latex    REVIEW OF SYSTEMS:  Skin: No history of rash. No history of abnormal moles.  Infection: No history of hepatitis or HIV. No history of MRSA.  Neurologic: No history of stroke. No history of seizure. No history of headaches.  Cardiac: Has seen Dr. [name] for palpitations. She tried Beta blockers, but these caused a rash, so she is on no meds.  Pulmonary: Has seen Dr. [name] for lung nodules. She gets an annual CT scan. These have been stable.  Endocrine: No diabetes. No thyroid disease.  Gastrointestinal: No history of stomach disease. No history of liver disease. No history of gall bladder disease. No history of pancreas disease. No history of colon disease.  Urologic: No history of kidney stones. No history of bladder infections.  GYN: Sees [name], NP. (I think that she meant [name]) Unsure of primary GYN doctor.  Musculoskeletal: Right rotator cuff surgery in  2012 by Dr. [name]. She plays tennis.  Hematologic: No bleeding disorder. No history of anemia. Not anticoagulated.  Psycho-social: The patient is oriented. The patient has no obvious psychologic or social impairment to understanding our conversation and plan.   SOCIAL and FAMILY HISTORY:  Married.  Husband with her.  She is not working.  She has 3 sons: One in [place], one in [place], and one in [place]. It sounds like her husband brought children to the marriage.   PHYSICAL EXAM:  BP 141/66  Pulse 75  Temp(Src) 98.3 F (36.8 C) (Oral)  Resp 16  Ht 5\' 8"  (1.727 m)  Wt 140 lb (63.504 kg)  BMI 21.29 kg/m2  SpO2 100%  LMP 06/22/1977  General: Thinner WN WF who is alert and generally healthy appearing.  HEENT: Normal. Pupils equal.  Neck: Supple. No mass. No thyroid mass.  Lymph Nodes: No supraclavicular, cervical, axillary or inguinal nodes.  Lungs: Clear to auscultation and symmetric breath sounds.  Heart: RRR. No murmur or rub.  Abdomen: Soft. No mass. No tenderness. No hernia. Normal bowel sounds. Pfannenstiel incision. No mass.  Rectal: Not done.  Extremities: Good strength and ROM in upper and lower extremities. She has a 1 cm healing incision on her right mid forearm.  Neurologic: Grossly intact to motor and sensory function.  Psychiatric: Has normal mood and affect. Behavior is normal.    Right forearm.   DATA REVIEWED:  Path report and Epic notes.  [name], MD, [facility], [address] [place], [place]  Phone: [phone] FAX: [phone]

## 2013-10-02 NOTE — Transfer of Care (Signed)
Immediate Anesthesia Transfer of Care Note  Patient: [name]  Procedure(s) Performed: Procedure(s): WIDE EXCISION MELANOMA RIGHT ARM WITH SENTINEL LYMPH NODE BIOPSY (Right)  Patient Location: PACU  Anesthesia Type:General  Level of Consciousness: awake, oriented and patient cooperative  Airway & Oxygen Therapy: Patient Spontanous Breathing and Patient connected to face mask oxygen  Post-op Assessment: Report given to PACU RN and Post -op Vital signs reviewed and stable  Post vital signs: Reviewed and stable  Complications: No apparent anesthesia complications

## 2013-10-02 NOTE — Anesthesia Postprocedure Evaluation (Signed)
  Anesthesia Post-op Note  Patient: [name]  Procedure(s) Performed: Procedure(s): WIDE EXCISION MELANOMA RIGHT ARM WITH SENTINEL LYMPH NODE BIOPSY (Right)  Patient Location: PACU  Anesthesia Type:General  Level of Consciousness: awake, alert , oriented and patient cooperative  Airway and Oxygen Therapy: Patient Spontanous Breathing  Post-op Pain: mild  Post-op Assessment: Post-op Vital signs reviewed, Patient's Cardiovascular Status Stable, Respiratory Function Stable, Patent Airway, NAUSEA AND VOMITING PRESENT and Pain level controlled  Post-op Vital Signs: stable  Last Vitals:  Filed Vitals:   10/02/13 1657  BP:   Pulse: 72  Temp:   Resp: 14    Complications: No apparent anesthesia complications

## 2013-10-03 NOTE — Op Note (Deleted)
[name], [name] NO.:  [id number]  MEDICAL RECORD NO.:  [id number]  LOCATION:  [facility]                          FACILITY:  [facility]  PHYSICIAN:  Fenton Malling. Lucia Gaskins, M.D.  DATE OF BIRTH:  16-Nov-1945  DATE OF PROCEDURE:  10/02/2013                              OPERATIVE REPORT  PREOPERATIVE DIAGNOSIS:  A 2.1-mm deep right forearm melanoma.  POSTOPERATIVE DIAGNOSIS:  A 2.1-mm deep right forearm melanoma.  PROCEDURE:  Wide excision of right forearm melanoma with right axillary sentinel lymph node biopsy, injection of 0.2 mL of Lymphazurin blue at the melanoma site.  ANESTHESIA:  General LMA.  COMPLICATIONS:  None.  INDICATION FOR PROCEDURE:  [name] is a 68 year old white female patient of Dr. Carolann Littler who had a melanoma excised from her right forearm by Dr. Druscilla Brownie on August 21, 2013.  The final pathology showed a 2.1-mm thick melanoma, which was Clark's level 4 with margins positive.  I discussed with the patient about proceeding with wider excision locally of her right forearm and identifying a sentinel lymph node.  I did a lymphoscintigraphy, but this was negative. So, I told her that we have a nuclear medicine injection on the day of surgery to try to find a sentinel lymph node.  If nothing showed up, I would not remove a lymph node.  The risks of surgery include bleeding, infection, nerve injuries, recurrence of the melanoma, and possibly the need for possible further surgery.  OPERATIVE NOTE:  The patient was taken to room #8 at [facility]. Her right arm had been injected in the holding area with 1 millicurie of technetium sulfur colloid.  I injected about 0.2 mL of Lymphazurin blue in her arm at the melanoma site in the operating room.  We had been using methylene blue, but there is a shortage of methylene blue at this time. Her right arm was prepped with ChloraPrep and sterilely draped.    A time-out was then held and the surgical checklist  run.  I started with her right axilla and used the Neoprobe and identified a hot area immediately posterior to the pectoralis major muscle.  I made an incision over this and identified at least 1, if not 2 lymph nodes.  Neither were blue, but had counts of 600.  The background was no more than 20.  This was sent off as a hot, non-blue lymph node.  The wound was then irrigated.  The subcutaneous tissue was closed with 3-0 Vicryl suture. The skin closed with a 5-0 Monocryl suture and I applied Dermabond at the end of the procedure.  I then turned my attention to the right forearm where she had the melanoma.  I tried to excise at least about a 1.5 cm wide of this, making about a 3-4 cm wide incision that was about 9-10 cm in length.  I made a long suture for the distal part of the incision.  I made a short suture anterior, which was the thenar side of the incision.  I cut down through the muscles of the left forearm, went down to the fascia, and we tried to excise all the subcutaneous  fat underneath the lesion.  This was then sent for permanent pathology.  The subcutaneous tissues were closed with interrupted 3-0 Vicryl sutures.  Hemostasis was controlled with Bovie electrocautery and 3-0 Vicryl sutures and the skin was closed with a combination of 2-0 nylon and 3-0 nylon sutures.  I did not try to do any subcuticular sutures because of the tension that was on the skin area and just thought it would not really be helpful.  The patient tolerated the procedure well, was transported to the recovery room in good condition.  Sponge and needle counts were correct at the end of the case.  She will go home today with followup with me in 10-14 days for wound check and pathology review.  I gave her Vicodin for pain.   Fenton Malling. Lucia Gaskins, M.D., FACS   DHN/MEDQ  D:  10/02/2013  T:  10/03/2013  Job:  [id number]  cc:   Carolann Littler, M.D. Frederick A. Aldona Lento, M.D.

## 2013-10-03 NOTE — Op Note (Deleted)
NAMECHONDRA, Liu NO.:  [id number]  MEDICAL RECORD NO.:  [id number]  LOCATION:  [facility]                          FACILITY:  [facility]  PHYSICIAN:  Fenton Malling. Lucia Gaskins, M.D.  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  PROCEDURE:  Wide excision of right forearm melanoma with right axillary sentinel lymph node biopsy, injection of 0.2 mL of Lymphazurin blue.  ANESTHESIA:  General LMA.  COMPLICATIONS:  None.  INDICATION FOR PROCEDURE:  [name] is a 68 year old white female patient of Dr. Carolann Littler who had a melanoma excised from her right forearm by Dr. Druscilla Brownie on August 21, 2013.  The final pathology showed a 2.1-mm thick melanoma, which was Clark's level 4 with margins positive.  So, I discussed with the patient about proceeding with wider excision locally of her right forearm and identifying a sentinel lymph node.  I did a lymphoscintigraphy, this actually proved to be negative. So, I told her that we have steroid injection on the day of surgery and I think it is final lymph node.  I would remove it if not, then would do nothing further.  The risks of surgery include bleeding, infection, nerve injuries, recurrence of the melanoma, and possibly the need for possible further surgery.  OPERATIVE NOTE:  The patient was taken to room #8 at [facility] Day surgery. Her right arm had been injected in the holding area with 1 millicurie of technetium sulfur colloid.  I injected about 0.3 mL of Lymphazurin blue in her arm in the operating room.  We had been using methylene blue because it supported right now methylene blue.  Her right arm was prepped with ChloraPrep and sterilely draped.  A time- out was then held and the surgical checklist run.  I  started with her right axilla and used the Neoprobe and identified a hot area immediately posterior to the pectoralis major muscle.  I made an incision over this and identified at least 1, if not 2 lymph nodes. Neither were blue, but had counts of 600.  The background was no more than 20.  This was sent off as a hot, non-blue lymph node.  The wound was then irrigated.  The subcutaneous tissue was closed with 3-0 Vicryl suture. The skin closed with a 5-0 Monocryl suture and I applied Dermabond at the end of the procedure.  I then turned my attention to the right forearm where she had the melanoma.  I tried to excise at least about a 1.5 cm wide of this, making about a 3-4 cm wide incision, it was about 9-10 cm in length.  I made a long suture for the distal part of the incision.  I made a short suture anterior, which was the thenar side of the incision.  I cut down through the muscles of the left forearm, went down to the fascia, and we tried to excise all the subcutaneous fat underneath the lesion.  This was then  sent for permanent pathology.  The subcutaneous tissues were closed with interrupted 3-0 Vicryl sutures.  Hemostasis was controlled with Bovie electrocautery and 3-0 Vicryl sutures and the skin was closed with a combination of 2-0 nylon and 3-0 nylon sutures.  I did not try to do any subcuticular sutures because of the tension that was on the skin area and just thought it would not really be helpful.  The patient tolerated the procedure well, was transported to the recovery room in good condition.  Sponge and needle counts were correct at the end of the case.  She will go home today with followup with me in 10-14 days for wound check and pathology review.  I gave her Vicodin for pain.     Fenton Malling. Lucia Gaskins, M.D.     DHN/MEDQ  D:  10/02/2013  T:  10/03/2013  Job:  [id number]  cc:   Carolann Littler, M.D. Frederick A. Aldona Lento, M.D.

## 2013-10-03 NOTE — Op Note (Signed)
[name], [name] NO.:  [id number]  MEDICAL RECORD NO.:  [id number]  LOCATION:  [facility]                          FACILITY:  [facility]  PHYSICIAN:  [name], M.D.  DATE OF BIRTH:  16-Nov-1945  DATE OF PROCEDURE:  10/02/2013                              OPERATIVE REPORT  PREOPERATIVE DIAGNOSIS:  A 2.1-mm deep right forearm melanoma.  POSTOPERATIVE DIAGNOSIS:  A 2.1-mm deep right forearm melanoma.  PROCEDURE:  Wide excision of right forearm melanoma with right axillary sentinel lymph node biopsy, injection of 0.2 mL of Lymphazurin blue at the melanoma site.  ANESTHESIA:  General LMA.  COMPLICATIONS:  None.  INDICATION FOR PROCEDURE:  [name] is a 68 year old white female patient of Dr. Carolann Liu who had a melanoma excised from her right forearm by [name] on August 21, 2013.  The final pathology showed a 2.1-mm thick melanoma, which was Clark's level 4 with margins positive.  I discussed with the patient about proceeding with wider excision locally of her right forearm and identifying a sentinel lymph node.  I did a lymphoscintigraphy, but this was negative. So, I told her that we have a nuclear medicine injection on the day of surgery to try to find a sentinel lymph node.  If nothing showed up, I would not remove a lymph node.  The risks of surgery include bleeding, infection, nerve injuries, recurrence of the melanoma, and possibly the need for possible further surgery.  OPERATIVE NOTE:  The patient was taken to room #8 at [facility] Day surgery. Her right arm had been injected in the holding area with 1 millicurie of technetium sulfur colloid.  I injected about 0.2 mL of Lymphazurin blue in her arm at the melanoma site in the operating room.  We had been using methylene blue, but there is a shortage of methylene blue at this time. Her right arm was prepped with ChloraPrep and sterilely draped.    A time-out was then held and the surgical checklist  run.  I started with her right axilla and used the Neoprobe and identified a hot area immediately posterior to the pectoralis major muscle.  I made an incision over this and identified at least 1, if not 2 lymph nodes.  Neither were blue, but had counts of 600.  The background was no more than 20.  This was sent off as a hot, non-blue lymph node.  The wound was then irrigated.  The subcutaneous tissue was closed with 3-0 Vicryl suture. The skin closed with a 5-0 Monocryl suture and I applied Dermabond at the end of the procedure.  I then turned my attention to the right forearm where she had the melanoma.  I tried to excise at least about a 1.5 cm wide of this, making about a 3-4 cm wide incision that was about 9-10 cm in length.  I made a long suture for the distal part of the incision.  I made a short suture anterior, which was the thenar side of the incision.  I cut down through the muscles of the left forearm, went down to the fascia, and we tried to excise all the subcutaneous  fat underneath the lesion.  This was then sent for permanent pathology.  The subcutaneous tissues were closed with interrupted 3-0 Vicryl sutures.  Hemostasis was controlled with Bovie electrocautery and 3-0 Vicryl sutures and the skin was closed with a combination of 2-0 nylon and 3-0 nylon sutures.  I did not try to do any subcuticular sutures because of the tension that was on the skin area and just thought it would not really be helpful.  The patient tolerated the procedure well, was transported to the recovery room in good condition.  Sponge and needle counts were correct at the end of the case.  She will go home today with followup with me in 10-14 days for wound check and pathology review.  I gave her Vicodin for pain.   [name], M.D., FACS   DHN/MEDQ  D:  10/02/2013  T:  10/03/2013  Job:  [id number]  cc:   [name], M.D. [name], M.D.

## 2013-10-09 ENCOUNTER — Encounter (HOSPITAL_BASED_OUTPATIENT_CLINIC_OR_DEPARTMENT_OTHER): Payer: Self-pay | Admitting: Surgery

## 2013-10-11 ENCOUNTER — Encounter: Payer: Medicare Other | Admitting: Family Medicine

## 2013-10-12 ENCOUNTER — Encounter (INDEPENDENT_AMBULATORY_CARE_PROVIDER_SITE_OTHER): Payer: Self-pay | Admitting: Surgery

## 2013-10-12 ENCOUNTER — Ambulatory Visit (INDEPENDENT_AMBULATORY_CARE_PROVIDER_SITE_OTHER): Payer: Medicare Other | Admitting: Surgery

## 2013-10-12 VITALS — BP 142/78 | HR 80 | Temp 97.8°F | Resp 14 | Wt 139.0 lb

## 2013-10-12 DIAGNOSIS — C4360 Malignant melanoma of unspecified upper limb, including shoulder: Secondary | ICD-10-CM

## 2013-10-12 NOTE — Progress Notes (Signed)
Re:   [name] DOB:   [birth year] MRN:   [id number]  ASSESSMENT AND PLAN: 1.  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  The margins are positive on the path.  Wide excision and right axillary SLNBx - 10/02/2013 - D. Conleigh Heinlein  Final path [id number]) - no residual melanoma, negative sentinel lymph node  I removed 2/3 of the sutures.  She'll see me back next week to remove the remainder of her sutures.  She also has an appt to see [name] in the near future.  2.  Anxiety when she flies. 3.  MVP/mitral regurgitation  She sees Dr. Aundra Dubin  Chief Complaint  Patient presents with  . Follow-up    1st po melanoma rt forearm   REFERRING PHYSICIAN:  [name]  HISTORY OF PRESENT ILLNESS: [name] is a 68 y.o. (DOB: 05-21-1946)   female whose primary care physician is [name], MD and comes to me today for evaluation of melanoma. She comes with her husband. She has done well.  There is just a little drainage from the wound.  Her axilla bothers her more than the right forearm wound. She is wondering about exercise.  I will address that next week when I take out the remainder of her sutures.  History of melanoma: She had a skin lesion that she noticed on her right forearm for some time, maybe greater than a year.  Because of changes in the lesion, she underwent a shave biopsy of the right forearm lesion by [name] on 08/21/2013.  Path report -  Accession: [id number] - 2.1 mm thick, Clark's level IV, margins positive. She has had no other history of skin cancer and there is no melanoma in her family.   Past Medical History  Diagnosis Date  . ANXIETY 02/19/2009  . INSOMNIA, CHRONIC 03/29/2009  . Migraine   . Skin cancer of arm     Squamous  . MVP (mitral valve prolapse)     NO MEDS  . Thyroid disease     cyst removed  . Osteopenia 2006  . Heart murmur     mild mvr  . PONV (postoperative nausea and  vomiting)      Current Outpatient Prescriptions  Medication Sig Dispense Refill  . ALPRAZolam (XANAX) 0.5 MG tablet TAKE 1 TABLET BY MOUTH EVERY 8 HOURS AS NEEDED FOR ANXIETY  30 tablet  0  . aspirin 81 MG tablet Take 81 mg by mouth daily.        Marland Kitchen HYDROcodone-acetaminophen (NORCO/VICODIN) 5-325 MG per tablet Take 1-2 tablets by mouth every 6 (six) hours as needed.  30 tablet  0  . mometasone (NASONEX) 50 MCG/ACT nasal spray Place 2 sprays into the nose daily.  17 g  3  . nystatin cream (MYCOSTATIN) Apply 1 application topically 2 (two) times daily.  30 g  1  . zolpidem (AMBIEN) 10 MG tablet TAKE 1 TABLET BY MOUTH EVERY DAY AT BEDTIME  30 tablet  2  . doxycycline (ADOXA) 100 MG tablet       . fluconazole (DIFLUCAN) 150 MG tablet        No current facility-administered medications for this visit.      Allergies  Allergen Reactions  . Inderal [Propranolol Hcl]     SEVERE LIP AND SKIN DRYNESS  . Tape     Very sensitive skin-  . Latex Itching  Sensitive to latex    REVIEW OF SYSTEMS: Cardiac:  Has seen Dr. [name] for palpitations.  She tried Beta blockers, but these caused a  rash, so she is on no meds. Pulmonary:  Has seen Dr. [name] for lung nodules.  She gets an annual CT scan.  These have been stable. GYN:  Sees [name], NP. (I think that she meant [name])  Unsure of primary GYN doctor. Musculoskeletal:  Right rotator cuff surgery in 2012 by Dr. [name].  She plays tennis.  SOCIAL and FAMILY HISTORY: Married. Husband with her. She is not working. She has 3 sons:  One in [place], one in [place], and one in [place].  It sounds like her husband brought children to the marriage.  PHYSICAL EXAM: BP 142/78  Pulse 80  Temp(Src) 97.8 F (36.6 C)  Resp 14  Wt 139 lb (63.05 kg)  LMP 06/22/1977  General: Thinner WN WF who is alert and generally healthy appearing.  HEENT: Normal. Pupils equal. Neck: Supple. No mass.   Lymph Nodes:  Right axillary wound  looks okay. Extremities:  Right forearm wound looks good.  I removed 2/3's of the sutures.  DATA REVIEWED: Path report to patient.  [name], MD,  [facility], [address]  [place], [place]    [place] Phone:  [phone] FAX:  [phone]

## 2013-10-17 DIAGNOSIS — L570 Actinic keratosis: Secondary | ICD-10-CM | POA: Diagnosis not present

## 2013-10-17 DIAGNOSIS — L819 Disorder of pigmentation, unspecified: Secondary | ICD-10-CM | POA: Diagnosis not present

## 2013-10-17 DIAGNOSIS — Z85820 Personal history of malignant melanoma of skin: Secondary | ICD-10-CM | POA: Diagnosis not present

## 2013-10-17 DIAGNOSIS — D235 Other benign neoplasm of skin of trunk: Secondary | ICD-10-CM | POA: Diagnosis not present

## 2013-10-18 ENCOUNTER — Ambulatory Visit (INDEPENDENT_AMBULATORY_CARE_PROVIDER_SITE_OTHER): Payer: Medicare Other | Admitting: Surgery

## 2013-10-18 ENCOUNTER — Encounter (INDEPENDENT_AMBULATORY_CARE_PROVIDER_SITE_OTHER): Payer: Self-pay | Admitting: Surgery

## 2013-10-18 VITALS — BP 122/80 | HR 68 | Temp 97.6°F | Resp 14 | Ht 68.0 in | Wt 141.0 lb

## 2013-10-18 DIAGNOSIS — C4360 Malignant melanoma of unspecified upper limb, including shoulder: Secondary | ICD-10-CM

## 2013-10-18 NOTE — Progress Notes (Signed)
Re:   [name] DOB:   Sep 16, 1945 MRN:   [id number]  ASSESSMENT AND PLAN: 1.  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  The margins are positive on the path.  Wide excision and right axillary SLNBx - 10/02/2013 - D. Temitope Flammer  Final path [phone]) - no residual melanoma, negative sentinel lymph node  Remainder of sutures are out.  Her wound looks good.  She will see me PRN.  [name] will see her every 6 months going forward.  2.  Anxiety when she flies. 3.  MVP/mitral regurgitation  She sees Dr. Aundra Dubin  Chief Complaint  Patient presents with  . Routine Post Op   REFERRING PHYSICIAN:  [name]  HISTORY OF PRESENT ILLNESS: [name] is a 68 y.o. (DOB: September 23, 1945)   female whose primary care physician is [name], MD and comes to me today for evaluation of melanoma. She comes by herself. She is doing well.  She saw [name] last week.  She sees Dr. [name] tomorrow. She has a little numbness in her axilla.  But no other concern.  History of melanoma: She had a skin lesion that she noticed on her right forearm for some time, maybe greater than a year.  Because of changes in the lesion, she underwent a shave biopsy of the right forearm lesion by [name] on 08/21/2013.  Path report -  Accession: [id number] - 2.1 mm thick, Clark's level IV, margins positive. She has had no other history of skin cancer and there is no melanoma in her family.   Past Medical History  Diagnosis Date  . ANXIETY 02/19/2009  . INSOMNIA, CHRONIC 03/29/2009  . Migraine   . Skin cancer of arm     Squamous  . MVP (mitral valve prolapse)     NO MEDS  . Thyroid disease     cyst removed  . Osteopenia 2006  . Heart murmur     mild mvr  . PONV (postoperative nausea and vomiting)      Current Outpatient Prescriptions  Medication Sig Dispense Refill  . ALPRAZolam (XANAX) 0.5 MG tablet TAKE 1 TABLET BY MOUTH EVERY 8  HOURS AS NEEDED FOR ANXIETY  30 tablet  0  . aspirin 81 MG tablet Take 81 mg by mouth daily.        . hydrocortisone 2.5 % cream       . ketoconazole (NIZORAL) 2 % cream       . mometasone (NASONEX) 50 MCG/ACT nasal spray Place 2 sprays into the nose daily.  17 g  3  . zolpidem (AMBIEN) 10 MG tablet TAKE 1 TABLET BY MOUTH EVERY DAY AT BEDTIME  30 tablet  2  . nystatin cream (MYCOSTATIN) Apply 1 application topically 2 (two) times daily.  30 g  1   No current facility-administered medications for this visit.      Allergies  Allergen Reactions  . Inderal [Propranolol Hcl]     SEVERE LIP AND SKIN DRYNESS  . Tape     Very sensitive skin-  . Latex Itching    Sensitive to latex    REVIEW OF SYSTEMS: Cardiac:  Has seen Dr. [name] for palpitations.  She tried Beta blockers, but these caused a  rash, so she is on no meds. Pulmonary:  Has seen Dr. [name] for lung nodules.  She gets an annual CT scan.  These  have been stable. GYN:  Sees [name], NP. (I think that she meant [name])  Unsure of primary GYN doctor. Musculoskeletal:  Right rotator cuff surgery in 2012 by Dr. [name].  She plays tennis.  SOCIAL and FAMILY HISTORY: Married. She is not working. She has 3 sons:  One in [place], one in [place], and one in [place].  It sounds like her husband brought children to the marriage.  PHYSICAL EXAM: BP 122/80  Pulse 68  Temp(Src) 97.6 F (36.4 C)  Resp 14  Ht 5\' 8"  (1.727 m)  Wt 141 lb (63.957 kg)  BMI 21.44 kg/m2  LMP 06/22/1977  General: Thinner WN WF who is alert and generally healthy appearing.  Extremities:  Right forearm wound looks good.  I removed remainder of the sutures.  DATA REVIEWED: Nothing new.  April Overall, MD,  [facility], [address]  [place], [place]    [place] Phone:  [phone] FAX:  [phone]

## 2013-10-19 ENCOUNTER — Encounter: Payer: Self-pay | Admitting: Family Medicine

## 2013-10-19 ENCOUNTER — Ambulatory Visit (INDEPENDENT_AMBULATORY_CARE_PROVIDER_SITE_OTHER): Payer: Medicare Other | Admitting: Family Medicine

## 2013-10-19 VITALS — BP 130/78 | HR 71 | Temp 97.8°F | Ht 68.0 in | Wt 142.0 lb

## 2013-10-19 DIAGNOSIS — G4700 Insomnia, unspecified: Secondary | ICD-10-CM

## 2013-10-19 DIAGNOSIS — Z0000 Encounter for general adult medical examination without abnormal findings: Secondary | ICD-10-CM | POA: Diagnosis not present

## 2013-10-19 DIAGNOSIS — Z23 Encounter for immunization: Secondary | ICD-10-CM | POA: Diagnosis not present

## 2013-10-19 DIAGNOSIS — I341 Nonrheumatic mitral (valve) prolapse: Secondary | ICD-10-CM

## 2013-10-19 DIAGNOSIS — I059 Rheumatic mitral valve disease, unspecified: Secondary | ICD-10-CM | POA: Diagnosis not present

## 2013-10-19 DIAGNOSIS — Z136 Encounter for screening for cardiovascular disorders: Secondary | ICD-10-CM | POA: Diagnosis not present

## 2013-10-19 DIAGNOSIS — F411 Generalized anxiety disorder: Secondary | ICD-10-CM | POA: Diagnosis not present

## 2013-10-19 LAB — LIPID PANEL
CHOLESTEROL: 206 mg/dL — AB (ref 0–200)
HDL: 92.7 mg/dL (ref >39.00)
LDL CALC: 100 mg/dL — AB (ref 0–99)
Total CHOL/HDL Ratio: 2
Triglycerides: 66 mg/dL (ref 0.0–149.0)
VLDL: 13.2 mg/dL (ref 0.0–40.0)

## 2013-10-19 LAB — HEPATIC FUNCTION PANEL
ALK PHOS: 58 U/L (ref 39–117)
ALT: 19 U/L (ref 0–35)
AST: 23 U/L (ref 0–37)
Albumin: 4.2 g/dL (ref 3.5–5.2)
BILIRUBIN DIRECT: 0.1 mg/dL (ref 0.0–0.3)
TOTAL PROTEIN: 7.1 g/dL (ref 6.0–8.3)
Total Bilirubin: 0.7 mg/dL (ref 0.3–1.2)

## 2013-10-19 MED ORDER — ALPRAZOLAM 0.5 MG PO TABS: ORAL_TABLET | ORAL | Status: DC

## 2013-10-19 MED ORDER — ZOLPIDEM TARTRATE 10 MG PO TABS: ORAL_TABLET | ORAL | Status: DC

## 2013-10-19 NOTE — Progress Notes (Signed)
Subjective:    Patient ID: [name], female    DOB: September 28, 1945, 68 y.o.   MRN: [id number]  HPI Patient here for complete physical/Medicare wellness exam. She had recent diagnosis of melanoma right forearm. This was removed by her dermatologist and she subsequently had wide excision per general surgeon with negative lymph nodes. She's done well from that.  Her chronic problems include history of mitral prolapse, GERD, persistent insomnia, and situational anxiety. She is requesting alprazolam for flying.  Takes Ambien at night. Sleep hygiene discussed previously. No history of pneumonia vaccine. Tetanus is up-to-date. She's had previous colonoscopy about 7 years ago and she'll try to get reports of that.. Previous hysterectomy. She sees gynecologist regularly. Yearly mammograms.  Past Medical History  Diagnosis Date  . ANXIETY 02/19/2009  . INSOMNIA, CHRONIC 03/29/2009  . Migraine   . Skin cancer of arm     Squamous  . MVP (mitral valve prolapse)     NO MEDS  . Thyroid disease     cyst removed  . Osteopenia 2006  . Heart murmur     mild mvr  . PONV (postoperative nausea and vomiting)    Past Surgical History  Procedure Laterality Date  . Appendectomy  1969  . Tonsillectomy  1980  . Abdominal hysterectomy  1980    WITH BSO/FIBROIDS  . Oophorectomy      BSO WITH ABDOMINAL HYSTERECTOMY  . Thyroid cyst excision  05-01-11    RIGHT  . Shoulder arthroscopy  2010    right  . Colonoscopy    . Diagnostic laparoscopy      expl  . Excision melanoma with sentinel lymph node biopsy Right 10/02/2013    Procedure: WIDE EXCISION MELANOMA RIGHT ARM WITH SENTINEL LYMPH NODE BIOPSY;  Surgeon: Shann Medal, MD;  Location: [facility];  Service: General;  Laterality: Right;    reports that she quit smoking about 43 years ago. Her smoking use included Cigarettes. She has a 20 pack-year smoking history. She has never used smokeless tobacco. She reports that she drinks about 4.2  ounces of alcohol per week. She reports that she does not use illicit drugs. family history includes Alcohol abuse in her father; Breast cancer in her maternal grandmother; Cancer in her father and maternal aunt; Diabetes in her mother. Allergies  Allergen Reactions  . Inderal [Propranolol Hcl]     SEVERE LIP AND SKIN DRYNESS  . Tape     Very sensitive skin-  . Latex Itching    Sensitive to latex   1.  Risk factors based on Past Medical , Social, and Family history reviewed and as above 2.  Limitations in physical activities very active. Plays tennis regularly. No recent falls 3.  Depression/mood no depression or anxiety issues 4.  Hearing no deficits 5.  ADLs independent in all 6.  Cognitive function (orientation to time and place, language, writing, speech,memory) no memory deficits. Language and judgment intact 7.  Home Safety no issues 8.  Height, weight, and visual acuity. All stable 9.  Counseling continue regular weightbearing exercise. Continue regular calcium and vitamin D. Continue yearly mammogram and flu vaccine 10. Recommendation of preventive services. Prevnar 13 11. Labs based on risk factors lipid and hepatic. She's had other labs recently including CBC, TSH, basic metabolic panel 12. Care Plan as above    Review of Systems  Constitutional: Negative for fever, activity change, appetite change, fatigue and unexpected weight change.  HENT: Negative for ear pain, hearing  loss, sore throat and trouble swallowing.   Eyes: Negative for visual disturbance.  Respiratory: Negative for cough and shortness of breath.   Cardiovascular: Negative for chest pain and palpitations.  Gastrointestinal: Negative for abdominal pain, diarrhea, constipation and blood in stool.  Endocrine: Negative for polydipsia and polyuria.  Genitourinary: Negative for dysuria and hematuria.  Musculoskeletal: Negative for arthralgias, back pain and myalgias.  Skin: Negative for rash.  Neurological:  Negative for dizziness, syncope and headaches.  Hematological: Negative for adenopathy.  Psychiatric/Behavioral: Negative for confusion and dysphoric mood.       Objective:   Physical Exam  Constitutional: She is oriented to person, place, and time. She appears well-developed and well-nourished.  HENT:  Head: Normocephalic and atraumatic.  Eyes: EOM are normal. Pupils are equal, round, and reactive to light.  Neck: Normal range of motion. Neck supple. No thyromegaly present.  Cardiovascular: Normal rate, regular rhythm and normal heart sounds.   No murmur heard. Pulmonary/Chest: Breath sounds normal. No respiratory distress. She has no wheezes. She has no rales.  Abdominal: Soft. Bowel sounds are normal. She exhibits no distension and no mass. There is no tenderness. There is no rebound and no guarding.  Genitourinary:  Per gyn  Musculoskeletal: Normal range of motion. She exhibits no edema.  Lymphadenopathy:    She has no cervical adenopathy.  Neurological: She is alert and oriented to person, place, and time. She displays normal reflexes. No cranial nerve deficit.  Skin: No rash noted.  Right forearm reveals wound which is healing well from recent surgery  Psychiatric: She has a normal mood and affect. Her behavior is normal. Judgment and thought content normal.          Assessment & Plan:  Complete physical. No indication for Pap smear with previous hysterectomy. Prevnar 13 given. Continue yearly flu vaccine and mammogram. Continue regular calcium and vitamin D.  Persistent insomnia. Refill Ambien.  Situational anxiety. Refill alprazolam for upcoming trip with flying

## 2013-10-19 NOTE — Progress Notes (Signed)
Pre visit review using our clinic review tool, if applicable. No additional management support is needed unless otherwise documented below in the visit note. 

## 2013-10-19 NOTE — Patient Instructions (Signed)
Continue with annual flu vaccine and mammogram.

## 2013-10-28 ENCOUNTER — Other Ambulatory Visit: Payer: Self-pay | Admitting: Family Medicine

## 2013-11-06 ENCOUNTER — Other Ambulatory Visit: Payer: Self-pay | Admitting: Internal Medicine

## 2013-11-06 DIAGNOSIS — R911 Solitary pulmonary nodule: Secondary | ICD-10-CM

## 2013-11-07 DIAGNOSIS — L259 Unspecified contact dermatitis, unspecified cause: Secondary | ICD-10-CM | POA: Diagnosis not present

## 2013-11-16 ENCOUNTER — Inpatient Hospital Stay: Admission: RE | Admit: 2013-11-16 | Payer: Medicare Other | Source: Ambulatory Visit

## 2013-11-16 ENCOUNTER — Other Ambulatory Visit: Payer: Medicare Other

## 2013-11-17 ENCOUNTER — Other Ambulatory Visit: Payer: Self-pay | Admitting: Women's Health

## 2013-11-17 DIAGNOSIS — Z1231 Encounter for screening mammogram for malignant neoplasm of breast: Secondary | ICD-10-CM

## 2013-11-22 ENCOUNTER — Ambulatory Visit (INDEPENDENT_AMBULATORY_CARE_PROVIDER_SITE_OTHER): Payer: Medicare Other | Admitting: Family Medicine

## 2013-11-22 ENCOUNTER — Encounter: Payer: Self-pay | Admitting: Family Medicine

## 2013-11-22 VITALS — BP 130/72 | HR 71 | Temp 97.9°F | Wt 142.0 lb

## 2013-11-22 DIAGNOSIS — J019 Acute sinusitis, unspecified: Secondary | ICD-10-CM

## 2013-11-22 MED ORDER — AMOXICILLIN-POT CLAVULANATE 875-125 MG PO TABS: 1.0000 | ORAL_TABLET | Freq: Two times a day (BID) | ORAL | Status: DC

## 2013-11-22 NOTE — Progress Notes (Signed)
   Subjective:    Patient ID: [name], female    DOB: 03/07/1946, 68 y.o.   MRN: [id number]  Sinusitis Associated symptoms include congestion, headaches, sinus pressure and a sore throat. Pertinent negatives include no chills, coughing or shortness of breath.   Acute visit. Patient seen with about one week history of progressive sinus symptoms. Location is maxillary sinus bilaterally. She has pressure and fullness. Some upper teeth pain. No cough. Intermittent headaches. Increased malaise. No fevers or chills. She is using Nasacort without relief. She's had history of recurrent sinusitis in the past with difficulty fighting these.  Past Medical History  Diagnosis Date  . ANXIETY 02/19/2009  . INSOMNIA, CHRONIC 03/29/2009  . Migraine   . Skin cancer of arm     Squamous  . MVP (mitral valve prolapse)     NO MEDS  . Thyroid disease     cyst removed  . Osteopenia 2006  . Heart murmur     mild mvr  . PONV (postoperative nausea and vomiting)    Past Surgical History  Procedure Laterality Date  . Appendectomy  1969  . Tonsillectomy  1980  . Abdominal hysterectomy  1980    WITH BSO/FIBROIDS  . Oophorectomy      BSO WITH ABDOMINAL HYSTERECTOMY  . Thyroid cyst excision  05-01-11    RIGHT  . Shoulder arthroscopy  2010    right  . Colonoscopy    . Diagnostic laparoscopy      expl  . Excision melanoma with sentinel lymph node biopsy Right 10/02/2013    Procedure: WIDE EXCISION MELANOMA RIGHT ARM WITH SENTINEL LYMPH NODE BIOPSY;  Surgeon: Shann Medal, MD;  Location: [facility];  Service: General;  Laterality: Right;    reports that she quit smoking about 43 years ago. Her smoking use included Cigarettes. She has a 20 pack-year smoking history. She has never used smokeless tobacco. She reports that she drinks about 4.2 ounces of alcohol per week. She reports that she does not use illicit drugs. family history includes Alcohol abuse in her father; Breast cancer in her  maternal grandmother; Cancer in her father and maternal aunt; Diabetes in her mother. Allergies  Allergen Reactions  . Inderal [Propranolol Hcl]     SEVERE LIP AND SKIN DRYNESS  . Tape     Very sensitive skin-  . Latex Itching    Sensitive to latex      Review of Systems  Constitutional: Positive for fatigue. Negative for fever and chills.  HENT: Positive for congestion, sinus pressure and sore throat.   Respiratory: Negative for cough and shortness of breath.   Neurological: Positive for headaches.       Objective:   Physical Exam  Constitutional: She appears well-developed and well-nourished. No distress.  HENT:  Right Ear: External ear normal.  Left Ear: External ear normal.  Mouth/Throat: Oropharynx is clear and moist.  Neck: Neck supple.  Cardiovascular: Normal rate.   Pulmonary/Chest: Effort normal and breath sounds normal. No respiratory distress. She has no wheezes. She has no rales.  Lymphadenopathy:    She has no cervical adenopathy.          Assessment & Plan:  Acute sinusitis. Augmentin 875 mg twice daily for 10 days. Continue Nasacort. Previous intolerance with decongestants

## 2013-11-22 NOTE — Progress Notes (Signed)
Pre visit review using our clinic review tool, if applicable. No additional management support is needed unless otherwise documented below in the visit note. 

## 2013-11-22 NOTE — Patient Instructions (Signed)

## 2013-11-29 ENCOUNTER — Telehealth: Payer: Self-pay | Admitting: Family Medicine

## 2013-11-29 MED ORDER — FLUCONAZOLE 150 MG PO TABS: 150.0000 mg | ORAL_TABLET | Freq: Once | ORAL | Status: DC

## 2013-11-29 NOTE — Telephone Encounter (Signed)
Fluconazole 150 mg times one dose.

## 2013-11-29 NOTE — Telephone Encounter (Signed)
Pt is needing new rx for dyflucon, states dr. [name] has written it before prior to taking an antibiotic for her sinus infection.  Pt is currently on an antibiotic that dr burchette prescribed and now has a yeast infection. Send [facility] on [address] and [address].

## 2013-11-29 NOTE — Telephone Encounter (Signed)
Left message with Husband that RX is sent to pharmacy

## 2013-11-30 ENCOUNTER — Ambulatory Visit (INDEPENDENT_AMBULATORY_CARE_PROVIDER_SITE_OTHER)
Admission: RE | Admit: 2013-11-30 | Discharge: 2013-11-30 | Disposition: A | Discharge status: Home / Self Care | Payer: Medicare Other | Source: Ambulatory Visit | Attending: Internal Medicine | Admitting: Internal Medicine

## 2013-11-30 DIAGNOSIS — J984 Other disorders of lung: Secondary | ICD-10-CM | POA: Diagnosis not present

## 2013-11-30 DIAGNOSIS — R911 Solitary pulmonary nodule: Secondary | ICD-10-CM | POA: Diagnosis not present

## 2013-12-01 ENCOUNTER — Telehealth: Payer: Self-pay | Admitting: Internal Medicine

## 2013-12-01 NOTE — Telephone Encounter (Signed)
[id number] x1 

## 2013-12-01 NOTE — Telephone Encounter (Signed)
Pt called back and she is aware of CT results per MR>   appt has been scheduled for pt for 8-7 at 2:15.  Pt would like to be notified if any other earlier appts come open.  Will forward to [place] to make her aware.

## 2013-12-01 NOTE — Telephone Encounter (Signed)
Pt's spouse returned call & asks to be reached at [phone].  [name]

## 2013-12-01 NOTE — Telephone Encounter (Signed)
Let Janina Mayo know that CT shows stable nodules x 3 years and therefore cancer essentially ruled out. However, it has been few years since I saw her and there are new guidelines for ct scanning in prior smokers so I advise her to come in for followup visit  Dr. Brand Males, M.D., [facility].C.P Pulmonary and Critical Care Medicine Staff Physician [facility] Pager: [phone], If no answer or between  15:00h - 7:00h: call [phone]  12/01/2013 7:23 AM

## 2013-12-15 NOTE — Telephone Encounter (Signed)
MR is all booked until he leaves. If any cancellations become available, will call pt to inform her. Nothing further needed at this time.

## 2014-01-08 DIAGNOSIS — H5200 Hypermetropia, unspecified eye: Secondary | ICD-10-CM | POA: Diagnosis not present

## 2014-01-08 DIAGNOSIS — H524 Presbyopia: Secondary | ICD-10-CM | POA: Diagnosis not present

## 2014-01-08 DIAGNOSIS — H2510 Age-related nuclear cataract, unspecified eye: Secondary | ICD-10-CM | POA: Diagnosis not present

## 2014-01-18 ENCOUNTER — Ambulatory Visit (HOSPITAL_COMMUNITY): Payer: Medicare Other

## 2014-01-20 DIAGNOSIS — M8580 Other specified disorders of bone density and structure, unspecified site: Secondary | ICD-10-CM

## 2014-01-20 HISTORY — DX: Other specified disorders of bone density and structure, unspecified site: M85.80

## 2014-01-22 ENCOUNTER — Other Ambulatory Visit: Payer: Self-pay | Admitting: Family Medicine

## 2014-01-24 ENCOUNTER — Ambulatory Visit (HOSPITAL_COMMUNITY)
Admission: RE | Admit: 2014-01-24 | Discharge: 2014-01-24 | Disposition: A | Discharge status: Home / Self Care | Payer: Medicare Other | Source: Ambulatory Visit | Attending: Women's Health | Admitting: Women's Health

## 2014-01-24 ENCOUNTER — Telehealth: Payer: Self-pay | Admitting: Family Medicine

## 2014-01-24 ENCOUNTER — Ambulatory Visit (HOSPITAL_COMMUNITY): Payer: Medicare Other

## 2014-01-24 DIAGNOSIS — Z1231 Encounter for screening mammogram for malignant neoplasm of breast: Secondary | ICD-10-CM | POA: Insufficient documentation

## 2014-01-24 MED ORDER — ZOLPIDEM TARTRATE 10 MG PO TABS: ORAL_TABLET | ORAL | Status: DC

## 2014-01-24 NOTE — Telephone Encounter (Signed)
Rx called in pharmacy.

## 2014-01-24 NOTE — Telephone Encounter (Signed)
Pt following up on rx request zolpidem (AMBIEN) 10 MG tablet Pt states she has one pill left and cannot sleep w/out this med! [facility]/ [address]

## 2014-01-26 ENCOUNTER — Ambulatory Visit: Payer: Medicare Other | Admitting: Internal Medicine

## 2014-01-30 ENCOUNTER — Ambulatory Visit (INDEPENDENT_AMBULATORY_CARE_PROVIDER_SITE_OTHER): Payer: Medicare Other

## 2014-01-30 DIAGNOSIS — M949 Disorder of cartilage, unspecified: Secondary | ICD-10-CM | POA: Diagnosis not present

## 2014-01-30 DIAGNOSIS — M899 Disorder of bone, unspecified: Secondary | ICD-10-CM | POA: Diagnosis not present

## 2014-01-30 DIAGNOSIS — M8580 Other specified disorders of bone density and structure, unspecified site: Secondary | ICD-10-CM

## 2014-02-05 ENCOUNTER — Encounter: Payer: Self-pay | Admitting: Gynecology

## 2014-02-09 ENCOUNTER — Other Ambulatory Visit: Payer: Self-pay | Admitting: *Deleted

## 2014-02-09 DIAGNOSIS — M8580 Other specified disorders of bone density and structure, unspecified site: Secondary | ICD-10-CM

## 2014-02-13 ENCOUNTER — Other Ambulatory Visit: Payer: Medicare Other

## 2014-02-13 DIAGNOSIS — M8580 Other specified disorders of bone density and structure, unspecified site: Secondary | ICD-10-CM

## 2014-02-13 DIAGNOSIS — M949 Disorder of cartilage, unspecified: Secondary | ICD-10-CM | POA: Diagnosis not present

## 2014-02-13 DIAGNOSIS — M899 Disorder of bone, unspecified: Secondary | ICD-10-CM | POA: Diagnosis not present

## 2014-02-14 ENCOUNTER — Ambulatory Visit: Payer: Medicare Other | Admitting: Internal Medicine

## 2014-02-14 LAB — VITAMIN D 25 HYDROXY (VIT D DEFICIENCY, FRACTURES): Vit D, 25-Hydroxy: 38 ng/mL (ref 30–89)

## 2014-02-20 ENCOUNTER — Other Ambulatory Visit: Payer: Self-pay | Admitting: Orthopedic Surgery

## 2014-02-20 NOTE — Progress Notes (Signed)
Preoperative surgical orders have been place into the Epic hospital system for [name] on 02/20/2014, 10:01 AM  by Mickel Crow for surgery on 03/05/2014.  Preop Knee Surgery orders including IV Tylenol and IV Decadron as long as there are no contraindications to the above medications. Arlee Muslim, PA-C

## 2014-02-21 ENCOUNTER — Encounter (HOSPITAL_COMMUNITY): Payer: Self-pay | Admitting: Pharmacy Technician

## 2014-02-27 NOTE — Patient Instructions (Addendum)
VESPER TRANT  02/27/2014                           YOUR PROCEDURE IS SCHEDULED ON:  03/05/14               ENTER THRU [facility] MAIN HOSPITAL ENTRANCE AND                            FOLLOW  SIGNS TO SHORT STAY CENTER                 ARRIVE AT SHORT STAY AT:  12:00 pm               CALL THIS NUMBER IF ANY PROBLEMS THE DAY OF SURGERY :               [phone]                                REMEMBER:   Do not eat food AFTER MIDNIGHT              MAY HAVE CLEAR LIQUIDS UNTIL 9:00 AM     CLEAR LIQUID DIET   Foods Allowed                                                                     Foods Excluded  Coffee and tea, regular and decaf                             liquids that you cannot  Plain Jell-O in any flavor                                             see through such as: Fruit ices (not with fruit pulp)                                     milk, soups, orange juice  Iced Popsicles                                    All solid food Carbonated beverages, regular and diet                                    Cranberry, grape and apple juices Sports drinks like Gatorade Lightly seasoned clear broth or consume(fat free) Sugar, honey syrup  _____________________________________________________________________                Take these medicines the morning of surgery with               A SIPS OF WATER :   NONE      Do not wear jewelry, make-up   Do not wear  lotions, powders, or perfumes.   Do not shave legs or underarms 12 hrs. before surgery (men may shave face)  Do not bring valuables to the hospital.  Contacts, dentures or bridgework may not be worn into surgery.  Leave suitcase in the car. After surgery it may be brought to your room.  For patients admitted to the hospital more than one night, checkout time is            11:00 AM                                                       The day of discharge.   Patients discharged the day of surgery will not  be allowed to drive home.            If going home same day of surgery, must have someone stay with you              FIRST 24 hrs at home and arrange for some one to drive you              home from hospital.   ________________________________________________________________________                                                                        [place]  Before surgery, you can play an important role.  Because skin is not sterile, your skin needs to be as free of germs as possible.  You can reduce the number of germs on your skin by washing with CHG (chlorahexidine gluconate) soap before surgery.  CHG is an antiseptic cleaner which kills germs and bonds with the skin to continue killing germs even after washing. Please DO NOT use if you have an allergy to CHG or antibacterial soaps.  If your skin becomes reddened/irritated stop using the CHG and inform your nurse when you arrive at Short Stay. Do not shave (including legs and underarms) for at least 48 hours prior to the first CHG shower.  You may shave your face. Please follow these instructions carefully:   1.  Shower with CHG Soap the night before surgery and the  morning of Surgery.   2.  If you choose to wash your hair, wash your hair first as usual with your  normal  Shampoo.   3.  After you shampoo, rinse your hair and body thoroughly to remove the  shampoo.                                         4.  Use CHG as you would any other liquid soap.  You can apply chg directly  to the skin and wash . Gently wash with scrungie or clean wascloth    5.  Apply the CHG Soap to your body ONLY FROM THE NECK DOWN.   Do not use on open  Wound or open sores. Avoid contact with eyes, ears mouth and genitals (private parts).                        Genitals (private parts) with your normal soap.              6.  Wash thoroughly, paying special attention to the area where your surgery   will be performed.   7.  Thoroughly rinse your body with warm water from the neck down.   8.  DO NOT shower/wash with your normal soap after using and rinsing off  the CHG Soap .                9.  Pat yourself dry with a clean towel.             10.  Wear clean pajamas.             11.  Place clean sheets on your bed the night of your first shower and do not  sleep with pets.  Day of Surgery : Do not apply any lotions/deodorants the morning of surgery.  Please wear clean clothes to the hospital/surgery center.  FAILURE TO FOLLOW THESE INSTRUCTIONS MAY RESULT IN THE CANCELLATION OF YOUR SURGERY    PATIENT SIGNATURE_________________________________  ______________________________________________________________________     Adam Phenix  An incentive spirometer is a tool that can help keep your lungs clear and active. This tool measures how well you are filling your lungs with each breath. Taking long deep breaths may help reverse or decrease the chance of developing breathing (pulmonary) problems (especially infection) following:  A long period of time when you are unable to move or be active. BEFORE THE PROCEDURE   If the spirometer includes an indicator to show your best effort, your nurse or respiratory therapist will set it to a desired goal.  If possible, sit up straight or lean slightly forward. Try not to slouch.  Hold the incentive spirometer in an upright position. INSTRUCTIONS FOR USE  1. Sit on the edge of your bed if possible, or sit up as far as you can in bed or on a chair. 2. Hold the incentive spirometer in an upright position. 3. Breathe out normally. 4. Place the mouthpiece in your mouth and seal your lips tightly around it. 5. Breathe in slowly and as deeply as possible, raising the piston or the ball toward the top of the column. 6. Hold your breath for 3-5 seconds or for as long as possible. Allow the piston or ball to fall to the bottom of the  column. 7. Remove the mouthpiece from your mouth and breathe out normally. 8. Rest for a few seconds and repeat Steps 1 through 7 at least 10 times every 1-2 hours when you are awake. Take your time and take a few normal breaths between deep breaths. 9. The spirometer may include an indicator to show your best effort. Use the indicator as a goal to work toward during each repetition. 10. After each set of 10 deep breaths, practice coughing to be sure your lungs are clear. If you have an incision (the cut made at the time of surgery), support your incision when coughing by placing a pillow or rolled up towels firmly against it. Once you are able to get out of bed, walk around indoors and cough well. You may stop using the incentive spirometer when instructed by your caregiver.  RISKS AND COMPLICATIONS  Take your time so you do not get dizzy or light-headed.  If you are in pain, you may need to take or ask for pain medication before doing incentive spirometry. It is harder to take a deep breath if you are having pain. AFTER USE  Rest and breathe slowly and easily.  It can be helpful to keep track of a log of your progress. Your caregiver can provide you with a simple table to help with this. If you are using the spirometer at home, follow these instructions: [place] IF:   You are having difficultly using the spirometer.  You have trouble using the spirometer as often as instructed.  Your pain medication is not giving enough relief while using the spirometer.  You develop fever of 100.5 F (38.1 C) or higher. SEEK IMMEDIATE MEDICAL CARE IF:   You cough up bloody sputum that had not been present before.  You develop fever of 102 F (38.9 C) or greater.  You develop worsening pain at or near the incision site. MAKE SURE YOU:   Understand these instructions.  Will watch your condition.  Will get help right away if you are not doing well or get worse. Document Released:  10/19/2006 Document Revised: 08/31/2011 Document Reviewed: 12/20/2006 [facility] Patient Information 2014 [place], [place].   ________________________________________________________________________

## 2014-03-01 ENCOUNTER — Encounter (HOSPITAL_COMMUNITY)
Admission: RE | Admit: 2014-03-01 | Discharge: 2014-03-01 | Disposition: A | Discharge status: Home / Self Care | Payer: Medicare Other | Source: Ambulatory Visit | Attending: Orthopedic Surgery | Admitting: Orthopedic Surgery

## 2014-03-01 ENCOUNTER — Encounter (HOSPITAL_COMMUNITY): Payer: Self-pay

## 2014-03-01 DIAGNOSIS — Z01812 Encounter for preprocedural laboratory examination: Secondary | ICD-10-CM | POA: Diagnosis not present

## 2014-03-01 DIAGNOSIS — M7040 Prepatellar bursitis, unspecified knee: Secondary | ICD-10-CM | POA: Diagnosis not present

## 2014-03-01 DIAGNOSIS — Z01810 Encounter for preprocedural cardiovascular examination: Secondary | ICD-10-CM | POA: Insufficient documentation

## 2014-03-01 HISTORY — DX: Other specified postprocedural states: Z98.890

## 2014-03-01 HISTORY — DX: Personal history of malignant melanoma of skin: Z85.820

## 2014-03-01 HISTORY — DX: Personal history of other specified conditions: Z87.898

## 2014-03-01 HISTORY — DX: Prepatellar bursitis, unspecified knee: M70.40

## 2014-03-01 LAB — CBC
HCT: 37.4 % (ref 36.0–46.0)
Hemoglobin: 12.8 g/dL (ref 12.0–15.0)
MCH: 33.4 pg (ref 26.0–34.0)
MCHC: 34.2 g/dL (ref 30.0–36.0)
MCV: 97.7 fL (ref 78.0–100.0)
Platelets: 272 10*3/uL (ref 150–400)
RBC: 3.83 MIL/uL — AB (ref 3.87–5.11)
RDW: 13 % (ref 11.5–15.5)
WBC: 3.4 10*3/uL — ABNORMAL LOW (ref 4.0–10.5)

## 2014-03-04 DIAGNOSIS — M7041 Prepatellar bursitis, right knee: Secondary | ICD-10-CM | POA: Diagnosis present

## 2014-03-04 NOTE — H&P (Signed)
  CC- [name] is a 68 y.o. female who presents with right knee pain.  HPI- . Knee Pain: Patient presents with knee pain involving the  right knee. Onset of the symptoms was several months ago. Inciting event: none known. Current symptoms include pain located anteriorly. Pain is aggravated by kneeling and squatting.  Patient has had no prior knee problems. Evaluation to date: plain films: normal. Treatment to date: avoidance of offending activity.  Past Medical History  Diagnosis Date  . INSOMNIA, CHRONIC 03/29/2009  . Skin cancer of arm     Squamous  . MVP (mitral valve prolapse)     NO MEDS  . Osteopenia 01/2014    T score -1.8 FRAX 8.8%/1.3%  . Heart murmur     mild mvr  . PONV (postoperative nausea and vomiting)   . Prepatellar bursitis     RT KNEE  . History of melanoma excision   . History of palpitations     Past Surgical History  Procedure Laterality Date  . Appendectomy  1969  . Tonsillectomy  1980  . Abdominal hysterectomy  1980    WITH BSO/FIBROIDS  . Oophorectomy      BSO WITH ABDOMINAL HYSTERECTOMY  . Thyroid cyst excision  05-01-11    RIGHT  . Shoulder arthroscopy  2010    right  . Colonoscopy    . Diagnostic laparoscopy      expl  . Excision melanoma with sentinel lymph node biopsy Right 10/02/2013    Procedure: WIDE EXCISION MELANOMA RIGHT ARM WITH SENTINEL LYMPH NODE BIOPSY;  Surgeon: Shann Medal, MD;  Location: [facility];  Service: General;  Laterality: Right;    Prior to Admission medications   Medication Sig Start Date End Date Taking? Authorizing Provider  ibuprofen (ADVIL,MOTRIN) 200 MG tablet Take 400 mg by mouth every 6 (six) hours as needed for mild pain or moderate pain.    Historical Provider, MD  zolpidem (AMBIEN) 10 MG tablet Take 10 mg by mouth at bedtime as needed for sleep.    Historical Provider, MD    soft tissue tenderness over anterior knee with pre-patellar swelling, no effusion but fluid present in bursa ,  negative drawer sign, collateral ligaments intact  Physical Examination: General appearance - alert, well appearing, and in no distress Mental status - alert, oriented to person, place, and time Chest - clear to auscultation, no wheezes, rales or rhonchi, symmetric air entry Heart - normal rate, regular rhythm, normal S1, S2, no murmurs, rubs, clicks or gallops Abdomen - soft, nontender, nondistended, no masses or organomegaly Neurological - alert, oriented, normal speech, no focal findings or movement disorder noted   Asessment/Plan--- Left knee prepatellar bursitis- - Plan left knee excision bursa. Procedure risks and potential comps discussed with patient who elects to proceed. Goals are decreased pain and increased function with a high likelihood of achieving both

## 2014-03-04 NOTE — Anesthesia Preprocedure Evaluation (Addendum)
Anesthesia Evaluation  Patient identified by MRN, date of birth, ID band Patient awake    Reviewed: Allergy & Precautions, H&P , NPO status , Patient's Chart, lab work & pertinent test results  History of Anesthesia Complications (+) PONV and history of anesthetic complications  Airway Mallampati: II TM Distance: >3 FB Neck ROM: Full    Dental no notable dental hx.    Pulmonary neg pulmonary ROS, former smoker,  breath sounds clear to auscultation  Pulmonary exam normal       Cardiovascular Exercise Tolerance: Good negative cardio ROS  + Valvular Problems/Murmurs MVP and MR Rhythm:Regular Rate:Normal  Reports that she runs every day without any chest pain or DOE   Neuro/Psych Anxiety negative neurological ROS  negative psych ROS   GI/Hepatic negative GI ROS, Neg liver ROS,   Endo/Other  negative endocrine ROS  Renal/GU negative Renal ROS  negative genitourinary   Musculoskeletal negative musculoskeletal ROS (+) Arthritis -,   Abdominal   Peds negative pediatric ROS (+)  Hematology negative hematology ROS (+)   Anesthesia Other Findings   Reproductive/Obstetrics negative OB ROS                         Anesthesia Physical Anesthesia Plan  ASA: II  Anesthesia Plan: General   Post-op Pain Management:    Induction: Intravenous  Airway Management Planned: LMA  Additional Equipment:   Intra-op Plan:   Post-operative Plan: Extubation in OR  Informed Consent: I have reviewed the patients History and Physical, chart, labs and discussed the procedure including the risks, benefits and alternatives for the proposed anesthesia with the patient or authorized representative who has indicated his/her understanding and acceptance.   Dental advisory given  Plan Discussed with: CRNA  Anesthesia Plan Comments:         Anesthesia Quick Evaluation

## 2014-03-05 ENCOUNTER — Encounter (HOSPITAL_COMMUNITY): Payer: Medicare Other | Admitting: Anesthesiology

## 2014-03-05 ENCOUNTER — Ambulatory Visit (HOSPITAL_COMMUNITY): Payer: Medicare Other | Admitting: Anesthesiology

## 2014-03-05 ENCOUNTER — Encounter (HOSPITAL_COMMUNITY)
Admission: RE | Disposition: A | Discharge status: Home / Self Care | Payer: Self-pay | Source: Ambulatory Visit | Attending: Orthopedic Surgery

## 2014-03-05 ENCOUNTER — Ambulatory Visit (HOSPITAL_COMMUNITY)
Admission: RE | Admit: 2014-03-05 | Discharge: 2014-03-05 | Disposition: A | Discharge status: Home / Self Care | Payer: Medicare Other | Source: Ambulatory Visit | Attending: Orthopedic Surgery | Admitting: Orthopedic Surgery

## 2014-03-05 ENCOUNTER — Encounter (HOSPITAL_COMMUNITY): Payer: Self-pay | Admitting: *Deleted

## 2014-03-05 DIAGNOSIS — Z85820 Personal history of malignant melanoma of skin: Secondary | ICD-10-CM | POA: Insufficient documentation

## 2014-03-05 DIAGNOSIS — M76899 Other specified enthesopathies of unspecified lower limb, excluding foot: Secondary | ICD-10-CM | POA: Diagnosis not present

## 2014-03-05 DIAGNOSIS — M7150 Other bursitis, not elsewhere classified, unspecified site: Secondary | ICD-10-CM | POA: Diagnosis not present

## 2014-03-05 DIAGNOSIS — M949 Disorder of cartilage, unspecified: Secondary | ICD-10-CM | POA: Diagnosis not present

## 2014-03-05 DIAGNOSIS — M7040 Prepatellar bursitis, unspecified knee: Secondary | ICD-10-CM | POA: Insufficient documentation

## 2014-03-05 DIAGNOSIS — G4700 Insomnia, unspecified: Secondary | ICD-10-CM | POA: Diagnosis not present

## 2014-03-05 DIAGNOSIS — Z79899 Other long term (current) drug therapy: Secondary | ICD-10-CM | POA: Insufficient documentation

## 2014-03-05 DIAGNOSIS — Z87891 Personal history of nicotine dependence: Secondary | ICD-10-CM | POA: Diagnosis not present

## 2014-03-05 DIAGNOSIS — M6789 Other specified disorders of synovium and tendon, multiple sites: Secondary | ICD-10-CM | POA: Diagnosis not present

## 2014-03-05 DIAGNOSIS — M899 Disorder of bone, unspecified: Secondary | ICD-10-CM | POA: Insufficient documentation

## 2014-03-05 DIAGNOSIS — M7041 Prepatellar bursitis, right knee: Secondary | ICD-10-CM | POA: Diagnosis present

## 2014-03-05 HISTORY — PX: KNEE BURSECTOMY: SHX5882

## 2014-03-05 SURGERY — BURSECTOMY, KNEE
Anesthesia: General | Site: Knee | Laterality: Right

## 2014-03-05 MED ORDER — MIDAZOLAM HCL 2 MG/2ML IJ SOLN
INTRAMUSCULAR | Status: AC
  Filled 2014-03-05: qty 2

## 2014-03-05 MED ORDER — FENTANYL CITRATE 0.05 MG/ML IJ SOLN
INTRAMUSCULAR | Status: DC | PRN
  Administered 2014-03-05 (×8): 25 ug via INTRAVENOUS

## 2014-03-05 MED ORDER — SODIUM CHLORIDE 0.9 % IJ SOLN
INTRAMUSCULAR | Status: AC
  Filled 2014-03-05: qty 50

## 2014-03-05 MED ORDER — ONDANSETRON HCL 4 MG/2ML IJ SOLN
INTRAMUSCULAR | Status: DC | PRN
  Administered 2014-03-05: 4 mg via INTRAVENOUS

## 2014-03-05 MED ORDER — CEFAZOLIN SODIUM-DEXTROSE 2-3 GM-% IV SOLR
2.0000 g | INTRAVENOUS | Status: AC
  Administered 2014-03-05: 2 g via INTRAVENOUS

## 2014-03-05 MED ORDER — FENTANYL CITRATE 0.05 MG/ML IJ SOLN
INTRAMUSCULAR | Status: AC
  Filled 2014-03-05: qty 2

## 2014-03-05 MED ORDER — DEXAMETHASONE SODIUM PHOSPHATE 10 MG/ML IJ SOLN
INTRAMUSCULAR | Status: AC
  Filled 2014-03-05: qty 1

## 2014-03-05 MED ORDER — MEPERIDINE HCL 50 MG/ML IJ SOLN: 6.2500 mg | INTRAMUSCULAR | Status: DC | PRN

## 2014-03-05 MED ORDER — LACTATED RINGERS IV SOLN
INTRAVENOUS | Status: DC
  Administered 2014-03-05: 17:00:00 via INTRAVENOUS
  Administered 2014-03-05: 1000 mL via INTRAVENOUS

## 2014-03-05 MED ORDER — PROPOFOL 10 MG/ML IV BOLUS
INTRAVENOUS | Status: AC
  Filled 2014-03-05: qty 20

## 2014-03-05 MED ORDER — PROMETHAZINE HCL 25 MG/ML IJ SOLN
INTRAMUSCULAR | Status: AC
  Filled 2014-03-05: qty 1

## 2014-03-05 MED ORDER — DEXAMETHASONE SODIUM PHOSPHATE 10 MG/ML IJ SOLN
10.0000 mg | Freq: Once | INTRAMUSCULAR | Status: AC
  Administered 2014-03-05: 10 mg via INTRAVENOUS

## 2014-03-05 MED ORDER — CEFAZOLIN SODIUM-DEXTROSE 2-3 GM-% IV SOLR
INTRAVENOUS | Status: AC
  Filled 2014-03-05: qty 50

## 2014-03-05 MED ORDER — BUPIVACAINE HCL (PF) 0.25 % IJ SOLN
INTRAMUSCULAR | Status: AC
  Filled 2014-03-05: qty 30

## 2014-03-05 MED ORDER — 0.9 % SODIUM CHLORIDE (POUR BTL) OPTIME
TOPICAL | Status: DC | PRN
  Administered 2014-03-05: 1000 mL

## 2014-03-05 MED ORDER — ONDANSETRON HCL 4 MG/2ML IJ SOLN
INTRAMUSCULAR | Status: AC
  Filled 2014-03-05: qty 2

## 2014-03-05 MED ORDER — OXYCODONE HCL 5 MG PO TABS
5.0000 mg | ORAL_TABLET | Freq: Once | ORAL | Status: AC
  Administered 2014-03-05: 5 mg via ORAL
  Filled 2014-03-05: qty 1

## 2014-03-05 MED ORDER — MIDAZOLAM HCL 5 MG/5ML IJ SOLN
INTRAMUSCULAR | Status: DC | PRN
  Administered 2014-03-05: 2 mg via INTRAVENOUS

## 2014-03-05 MED ORDER — HYDROCODONE-ACETAMINOPHEN 5-325 MG PO TABS
1.0000 | ORAL_TABLET | Freq: Once | ORAL | Status: AC
  Administered 2014-03-05: 1 via ORAL
  Filled 2014-03-05: qty 1

## 2014-03-05 MED ORDER — LIDOCAINE HCL (CARDIAC) 20 MG/ML IV SOLN
INTRAVENOUS | Status: DC | PRN
  Administered 2014-03-05: 80 mg via INTRAVENOUS

## 2014-03-05 MED ORDER — SODIUM CHLORIDE 0.9 % IV SOLN: INTRAVENOUS | Status: DC

## 2014-03-05 MED ORDER — BUPIVACAINE LIPOSOME 1.3 % IJ SUSP
20.0000 mL | Freq: Once | INTRAMUSCULAR | Status: AC
  Administered 2014-03-05: 20 mL
  Filled 2014-03-05: qty 20

## 2014-03-05 MED ORDER — PROMETHAZINE HCL 25 MG/ML IJ SOLN
6.2500 mg | INTRAMUSCULAR | Status: AC | PRN
  Administered 2014-03-05 (×2): 6.25 mg via INTRAVENOUS

## 2014-03-05 MED ORDER — ACETAMINOPHEN 325 MG PO TABS: 325.0000 mg | ORAL_TABLET | ORAL | Status: DC | PRN

## 2014-03-05 MED ORDER — ACETAMINOPHEN 160 MG/5ML PO SOLN
325.0000 mg | ORAL | Status: DC | PRN
  Filled 2014-03-05: qty 20.3

## 2014-03-05 MED ORDER — FENTANYL CITRATE 0.05 MG/ML IJ SOLN
25.0000 ug | INTRAMUSCULAR | Status: DC | PRN
  Administered 2014-03-05 (×2): 50 ug via INTRAVENOUS

## 2014-03-05 MED ORDER — HYDROCODONE-ACETAMINOPHEN 5-325 MG PO TABS: 1.0000 | ORAL_TABLET | Freq: Four times a day (QID) | ORAL | Status: DC | PRN

## 2014-03-05 MED ORDER — PROPOFOL 10 MG/ML IV BOLUS
INTRAVENOUS | Status: DC | PRN
  Administered 2014-03-05: 150 mg via INTRAVENOUS

## 2014-03-05 MED ORDER — ACETAMINOPHEN 10 MG/ML IV SOLN
1000.0000 mg | Freq: Once | INTRAVENOUS | Status: AC
  Administered 2014-03-05: 1000 mg via INTRAVENOUS
  Filled 2014-03-05: qty 100

## 2014-03-05 MED ORDER — CHLORHEXIDINE GLUCONATE 4 % EX LIQD: 60.0000 mL | Freq: Once | CUTANEOUS | Status: DC

## 2014-03-05 SURGICAL SUPPLY — 43 items
BAG SPEC THK2 15X12 ZIP CLS (MISCELLANEOUS) ×1
BAG ZIPLOCK 12X15 (MISCELLANEOUS) ×3 IMPLANT
BANDAGE ELASTIC 6 VELCRO ST LF (GAUZE/BANDAGES/DRESSINGS) ×3 IMPLANT
BNDG COHESIVE 4X5 TAN STRL (GAUZE/BANDAGES/DRESSINGS) ×3 IMPLANT
BNDG GAUZE ELAST 4 BULKY (GAUZE/BANDAGES/DRESSINGS) ×3 IMPLANT
CLOSURE STERI-STRIP 1/4X4 (GAUZE/BANDAGES/DRESSINGS) ×3 IMPLANT
DRAPE EXTREMITY T 121X128X90 (DRAPE) ×2 IMPLANT
DRAPE INCISE IOBAN 66X45 STRL (DRAPES) ×3 IMPLANT
DRSG ADAPTIC 3X8 NADH LF (GAUZE/BANDAGES/DRESSINGS) ×2 IMPLANT
DRSG EMULSION OIL 3X16 NADH (GAUZE/BANDAGES/DRESSINGS) ×3 IMPLANT
DRSG PAD ABDOMINAL 8X10 ST (GAUZE/BANDAGES/DRESSINGS) ×3 IMPLANT
DURAPREP 26ML APPLICATOR (WOUND CARE) ×3 IMPLANT
ELECT REM PT RETURN 9FT ADLT (ELECTROSURGICAL) ×3
ELECTRODE REM PT RTRN 9FT ADLT (ELECTROSURGICAL) ×1 IMPLANT
GAUZE SPONGE 4X4 12PLY STRL (GAUZE/BANDAGES/DRESSINGS) ×3 IMPLANT
GLOVE BIO SURGEON STRL SZ7.5 (GLOVE) ×3 IMPLANT
GLOVE BIOGEL PI IND STRL 8 (GLOVE) ×1 IMPLANT
GLOVE BIOGEL PI IND STRL 8.5 (GLOVE) ×1 IMPLANT
GLOVE BIOGEL PI INDICATOR 8 (GLOVE) ×2
GLOVE BIOGEL PI INDICATOR 8.5 (GLOVE) ×2
GOWN STRL REUS W/TWL LRG LVL3 (GOWN DISPOSABLE) ×6 IMPLANT
GOWN STRL REUS W/TWL XL LVL3 (GOWN DISPOSABLE) ×6 IMPLANT
IMMOBILIZER KNEE 20 (SOFTGOODS)
IMMOBILIZER KNEE 20 THIGH 36 (SOFTGOODS) IMPLANT
KIT BASIN OR (CUSTOM PROCEDURE TRAY) ×3 IMPLANT
MANIFOLD NEPTUNE II (INSTRUMENTS) ×3 IMPLANT
NDL SAFETY ECLIPSE 18X1.5 (NEEDLE) ×1 IMPLANT
NEEDLE HYPO 18GX1.5 SHARP (NEEDLE) ×3
NS IRRIG 1000ML POUR BTL (IV SOLUTION) ×3 IMPLANT
PACK LOWER EXTREMITY WL (CUSTOM PROCEDURE TRAY) ×3 IMPLANT
PAD CAST 4YDX4 CTTN HI CHSV (CAST SUPPLIES) ×1 IMPLANT
PADDING CAST COTTON 4X4 STRL (CAST SUPPLIES) ×3
PADDING CAST COTTON 6X4 STRL (CAST SUPPLIES) ×4 IMPLANT
POSITIONER SURGICAL ARM (MISCELLANEOUS) ×3 IMPLANT
SCOTCHCAST PLUS 4X4 WHITE (CAST SUPPLIES) ×2 IMPLANT
SPONGE LAP 4X18 X RAY DECT (DISPOSABLE) ×3 IMPLANT
SUT VIC AB 1 CT1 27 (SUTURE) ×3
SUT VIC AB 1 CT1 27XBRD ANTBC (SUTURE) ×1 IMPLANT
SUT VIC AB 2-0 CT1 27 (SUTURE) ×3
SUT VIC AB 2-0 CT1 27XBRD (SUTURE) ×1 IMPLANT
SYR 50ML LL SCALE MARK (SYRINGE) ×3 IMPLANT
TOWEL OR 17X26 10 PK STRL BLUE (TOWEL DISPOSABLE) ×6 IMPLANT
WATER STERILE IRR 1500ML POUR (IV SOLUTION) ×3 IMPLANT

## 2014-03-05 NOTE — Brief Op Note (Signed)
03/05/2014  5:02 PM  PATIENT:  [name]  68 y.o. female  PRE-OPERATIVE DIAGNOSIS:  RIGHT KNEE BURSITIS  POST-OPERATIVE DIAGNOSIS:  RIGHT KNEE BURSITIS  PROCEDURE:  Procedure(s): RIGHT KNEE BURSECTOMY (Right)  SURGEON:  Surgeon(s) and Role:    * Gearlean Alf, MD - Primary  PHYSICIAN ASSISTANT:   ASSISTANTS: none   ANESTHESIA:   MAC  EBL:  Total I/O In: -  Out: 50 [Blood:50]  DRAINS: none   LOCAL MEDICATIONS USED:  OTHER Exparel  SPECIMEN:  Source of Specimen:  Right knee prepatellar bursitis  DISPOSITION OF SPECIMEN:  PATHOLOGY  COUNTS:  YES  TOURNIQUET:   Total Tourniquet Time Documented: Thigh (Right) - 5 minutes Total: Thigh (Right) - 5 minutes   DICTATION: .Other Dictation: Dictation Number [id number]  PLAN OF CARE: Discharge to home after PACU  PATIENT DISPOSITION:  PACU - hemodynamically stable.

## 2014-03-05 NOTE — Anesthesia Postprocedure Evaluation (Signed)
  Anesthesia Post-op Note  Patient: [name]  Procedure(s) Performed: Procedure(s) (LRB): RIGHT KNEE BURSECTOMY (Right)  Patient Location: PACU  Anesthesia Type: General  Level of Consciousness: awake and alert   Airway and Oxygen Therapy: Patient Spontanous Breathing  Post-op Pain: mild  Post-op Assessment: Post-op Vital signs reviewed, Patient's Cardiovascular Status Stable, Respiratory Function Stable, Patent Airway and No signs of Nausea or vomiting  Last Vitals:  Filed Vitals:   03/05/14 1745  BP: 139/78  Pulse: 60  Temp:   Resp: 13    Post-op Vital Signs: stable   Complications: No apparent anesthesia complications

## 2014-03-05 NOTE — Interval H&P Note (Signed)
History and Physical Interval Note:  03/05/2014 4:05 PM  [name]  has presented today for surgery, with the diagnosis of RIGHT KNEE BURSITIS  The various methods of treatment have been discussed with the patient and family. After consideration of risks, benefits and other options for treatment, the patient has consented to  Procedure(s): RIGHT KNEE BURSECTOMY (Right) as a surgical intervention .  The patient's history has been reviewed, patient examined, no change in status, stable for surgery.  I have reviewed the patient's chart and labs.  Questions were answered to the patient's satisfaction.     Gearlean Alf

## 2014-03-05 NOTE — Transfer of Care (Signed)
Immediate Anesthesia Transfer of Care Note  Patient: [name]  Procedure(s) Performed: Procedure(s) (LRB): RIGHT KNEE BURSECTOMY (Right)  Patient Location: PACU  Anesthesia Type: General  Level of Consciousness: sedated, patient cooperative and responds to stimulation  Airway & Oxygen Therapy: Patient Spontanous Breathing and Patient connected to face mask oxgen  Post-op Assessment: Report given to PACU RN and Post -op Vital signs reviewed and stable  Post vital signs: Reviewed and stable  Complications: No apparent anesthesia complications

## 2014-03-06 ENCOUNTER — Encounter (HOSPITAL_COMMUNITY): Payer: Self-pay | Admitting: Orthopedic Surgery

## 2014-03-06 NOTE — Op Note (Signed)
NAMEEVIAN, DERRINGER NO.:  [id number]  MEDICAL RECORD NO.:  [id number]  LOCATION:  [facility]                         FACILITY:  [facility]  PHYSICIAN:  Gaynelle Arabian, M.D.    DATE OF BIRTH:  12-22-1945  DATE OF PROCEDURE:  03/05/2014 DATE OF DISCHARGE:  03/05/2014                              OPERATIVE REPORT   PREOPERATIVE DIAGNOSIS:  Right knee prepatellar bursitis.  POSTOPERATIVE DIAGNOSIS:  Right knee prepatellar bursitis.  PROCEDURE:  Right knee prepatellar bursectomy.  SURGEON:  Gaynelle Arabian, M.D.  No assistant.  ANESTHESIA:  MAC.  ESTIMATED BLOOD LOSS:  Minimal.  DRAINS:  None.  COMPLICATIONS:  None.  TOURNIQUET TIME:  4 minutes at 300 mmHg.  SPECIMEN:  Prepatellar bursa sent to pathology for pathologic diagnosis.  CONDITION:  Stable to recovery.  BRIEF CLINICAL NOTE:  [name] is a 68 year old female who has had problems with prepatellar bursitis for many months now.  She has also had intermittent drainage.  She currently is infection free from this. She cannot kneel.  She has significant pain with it.  She presents now for excision of this bursa.  PROCEDURE IN DETAIL:  After successful administration of MAC anesthetic, tourniquet was placed high on the right thigh and right lower extremity was prepped and draped in the usual sterile fashion.  Extremities wrapped in Esmarch, and tourniquet inflated to 300 mmHg.  A midline incision was made over the patella with a #10 blade.  I ellipsed out the area that was previously open.  The skin was cut with #10 blade through subcutaneous tissue.  Bursa was identified and it was removed with rongeur and electrocautery.  It was large and thickened.  No evidence of any purulence.  Once it was fully removed, then I released the tourniquet total time of 4 minutes.  Spent approximately 10 minutes achieving meticulous hemostasis of the wound bed.  I was satisfied with the hemostasis.  We thoroughly irrigated  with saline solution and then closed subcu with interrupted 2-0 Vicryl and subcuticular running 4-0 Monocryl.  I then injected the subcu tissue and wound bed with 20 mL of Exparel.  The incision was cleaned and dried and Steri-Strips and a bulky sterile dressing applied.  She was then awakened and transported to recovery in stable condition.     Gaynelle Arabian, M.D.     FA/MEDQ  D:  03/05/2014  T:  03/06/2014  Job:  [id number]

## 2014-03-14 ENCOUNTER — Telehealth: Payer: Self-pay | Admitting: *Deleted

## 2014-03-14 ENCOUNTER — Encounter: Payer: Self-pay | Admitting: Internal Medicine

## 2014-03-14 ENCOUNTER — Ambulatory Visit (INDEPENDENT_AMBULATORY_CARE_PROVIDER_SITE_OTHER): Payer: Medicare Other | Admitting: Internal Medicine

## 2014-03-14 VITALS — BP 124/70 | HR 71 | Ht 68.0 in | Wt 140.0 lb

## 2014-03-14 DIAGNOSIS — R911 Solitary pulmonary nodule: Secondary | ICD-10-CM | POA: Diagnosis not present

## 2014-03-14 NOTE — Patient Instructions (Signed)
#  Lung nodule - left  - these are stable x 3 years as of June 2015  - no further followup  #Lung cancer screening  - you are a candidate for annual low dose CT scan chest for lung cancer screening starting July 2016 - recommend that you atleast have a low dose CT starting summer 2017 - by 2017 a screening program would have been formally established in community and you can discuss this with me or your [name], MD your PCP  #FOllowup  none active

## 2014-03-14 NOTE — Progress Notes (Signed)
Subjective:    Patient ID: [name], female    DOB: [birth year], 68 y.o.   MRN: [id number]  HPI   OV 03/14/2014  Chief Complaint  Patient presents with  . Follow-up    Pt was last seen on 01/2011. Pt denies SOB, cough and CP/tightness.      FU left lower lobe nodules x 2  - nondiagnopstic  ENB 2012 and then indeterminate INDI XPRESYS blood test in 2014. She had 3rd year CT chest in June 2015 and these nodules are stable. She comes now for followup because I have not seen her in a few yerars. SHe is asymptomatic and is doing well. SMoking hx review: 20 ppd former smoker.     reports that she quit smoking about 43 years ago. Her smoking use included Cigarettes. She has a 20 pack-year smoking history. She has never used smokeless tobacco.   has a past medical history of INSOMNIA, CHRONIC (03/29/2009); Skin cancer of arm; MVP (mitral valve prolapse); Osteopenia (01/2014); Heart murmur; PONV (postoperative nausea and vomiting); Prepatellar bursitis; History of melanoma excision; and History of palpitations.   has past surgical history that includes Appendectomy (1969); Tonsillectomy (1980); Abdominal hysterectomy (1980); Oophorectomy; Thyroid cyst excision (05-01-11); Shoulder arthroscopy (2010); Colonoscopy; Diagnostic laparoscopy; Excision melanoma with sentinel lymph node biopsy (Right, 10/02/2013); and Knee bursectomy (Right, 03/05/2014).   Review of Systems  Constitutional: Negative for fever and unexpected weight change.  HENT: Negative for congestion, dental problem, ear pain, nosebleeds, postnasal drip, rhinorrhea, sinus pressure, sneezing, sore throat and trouble swallowing.   Eyes: Negative for redness and itching.  Respiratory: Negative for cough, chest tightness, shortness of breath and wheezing.   Cardiovascular: Positive for palpitations. Negative for leg swelling.  Gastrointestinal: Negative for nausea and vomiting.  Genitourinary: Negative for dysuria.  Musculoskeletal:  Negative for joint swelling.  Skin: Negative for rash.  Neurological: Negative for headaches.  Hematological: Does not bruise/bleed easily.  Psychiatric/Behavioral: Negative for dysphoric mood. The patient is not nervous/anxious.    Current outpatient prescriptions:aspirin 81 MG tablet, Take 81 mg by mouth daily., Disp: , Rfl: ;  ibuprofen (ADVIL,MOTRIN) 200 MG tablet, Take 400 mg by mouth every 6 (six) hours as needed for mild pain or moderate pain., Disp: , Rfl: ;  zolpidem (AMBIEN) 10 MG tablet, Take 10 mg by mouth at bedtime as needed for sleep., Disp: , Rfl:      Objective:   Physical Exam  Vitals reviewed. Constitutional: She is oriented to person, place, and time. She appears well-developed and well-nourished. No distress.  Body mass index is 21.29 kg/(m^2).   HENT:  Head: Normocephalic and atraumatic.  Right Ear: External ear normal.  Left Ear: External ear normal.  Mouth/Throat: Oropharynx is clear and moist. No oropharyngeal exudate.  Eyes: Conjunctivae and EOM are normal. Pupils are equal, round, and reactive to light. Right eye exhibits no discharge. Left eye exhibits no discharge. No scleral icterus.  Neck: Normal range of motion. Neck supple. No JVD present. No tracheal deviation present. No thyromegaly present.  Cardiovascular: Normal rate, regular rhythm and intact distal pulses.  Exam reveals no gallop and no friction rub.   Murmur heard. Pulmonary/Chest: Effort normal and breath sounds normal. No respiratory distress. She has no wheezes. She has no rales. She exhibits no tenderness.  Abdominal: Soft. Bowel sounds are normal. She exhibits no distension and no mass. There is no tenderness. There is no rebound and no guarding.  Musculoskeletal: Normal range of motion. She exhibits no  edema and no tenderness.  Recent right knee surgery - limps  Lymphadenopathy:    She has no cervical adenopathy.  Neurological: She is alert and oriented to person, place, and time. She has  normal reflexes. No cranial nerve deficit. She exhibits normal muscle tone. Coordination normal.  Skin: Skin is warm and dry. No rash noted. She is not diaphoretic. No erythema. No pallor.  Sun tan +  Psychiatric: She has a normal mood and affect. Her behavior is normal. Judgment and thought content normal.    Filed Vitals:   03/14/14 1330  BP: 124/70  Pulse: 71  Height: 5\' 8"  (1.727 m)  Weight: 140 lb (63.504 kg)  SpO2: 94%         Assessment & Plan:  #Lung nodule - left  - these are stable x 3 years as of June 2015  - no further followup  #Lung cancer screening  #lung cancer screening I discussed screening Ct chest for early detection of lung cancer Explained that in age 31-75 and smoking history, annual low dose CT chest can pick up lung cancer early and has potential to save lives and cure lung cancer This is similar in concept to screening mammogram, colonoscopies and pap smears I explained Ct scan is low dose radiation I explained early lung cancer asymptomatic and only way to  detect is CT  With the real advantage that early lung cancer is curable through radiation or surgery I explained CT superior to CXR I explained that false positives are present and can incur cost and workup like biopsies, additional scan but benefit outweighs risk I explained currently out of pocket $300 but [facility] going to have a program billed through medicare soon I recommend that you atleast have a low dose CT starting summer 2017  - by 2017 a screening program would have been formally established in community and she can discuss this with me or her PCP [name], MD   #FOllowup  none active

## 2014-03-14 NOTE — Telephone Encounter (Signed)
Pt called requesting Vitamin D results from [facility] 02/13/14. Pt informed with normal results.

## 2014-04-16 ENCOUNTER — Telehealth: Payer: Self-pay | Admitting: Family Medicine

## 2014-04-16 MED ORDER — ZOLPIDEM TARTRATE 10 MG PO TABS: 10.0000 mg | ORAL_TABLET | Freq: Every evening | ORAL | Status: DC | PRN

## 2014-04-16 NOTE — Telephone Encounter (Signed)
Last visit 11/22/13 Last refill 01/24/14 #30 2 refill

## 2014-04-16 NOTE — Telephone Encounter (Signed)
Rx called into the pharmacy. 

## 2014-04-16 NOTE — Telephone Encounter (Signed)
Refill for 3 months. 

## 2014-04-16 NOTE — Telephone Encounter (Signed)
Pt need a refill on her Ambien pt stated they are going out off town.

## 2014-04-17 ENCOUNTER — Telehealth: Payer: Self-pay | Admitting: Family Medicine

## 2014-04-17 DIAGNOSIS — L814 Other melanin hyperpigmentation: Secondary | ICD-10-CM | POA: Diagnosis not present

## 2014-04-17 DIAGNOSIS — D485 Neoplasm of uncertain behavior of skin: Secondary | ICD-10-CM | POA: Diagnosis not present

## 2014-04-17 DIAGNOSIS — Z85820 Personal history of malignant melanoma of skin: Secondary | ICD-10-CM | POA: Diagnosis not present

## 2014-04-17 DIAGNOSIS — D225 Melanocytic nevi of trunk: Secondary | ICD-10-CM | POA: Diagnosis not present

## 2014-04-17 DIAGNOSIS — Z08 Encounter for follow-up examination after completed treatment for malignant neoplasm: Secondary | ICD-10-CM | POA: Diagnosis not present

## 2014-04-17 DIAGNOSIS — L570 Actinic keratosis: Secondary | ICD-10-CM | POA: Diagnosis not present

## 2014-04-17 NOTE — Telephone Encounter (Signed)
Gave approval for the Rx to be refilled early.

## 2014-04-17 NOTE — Telephone Encounter (Signed)
[facility] needs approval to refill pt's zolpidem (AMBIEN) 10 MG tablet early.  Pt had filled 11/5 and states they are going out of town on thurs.

## 2014-04-23 ENCOUNTER — Encounter: Payer: Self-pay | Admitting: Internal Medicine

## 2014-05-07 ENCOUNTER — Telehealth: Payer: Self-pay | Admitting: Family Medicine

## 2014-05-07 ENCOUNTER — Other Ambulatory Visit: Payer: Self-pay | Admitting: Dermatology

## 2014-05-07 DIAGNOSIS — D485 Neoplasm of uncertain behavior of skin: Secondary | ICD-10-CM | POA: Diagnosis not present

## 2014-05-07 DIAGNOSIS — L570 Actinic keratosis: Secondary | ICD-10-CM | POA: Diagnosis not present

## 2014-05-07 DIAGNOSIS — D692 Other nonthrombocytopenic purpura: Secondary | ICD-10-CM | POA: Diagnosis not present

## 2014-05-07 DIAGNOSIS — L249 Irritant contact dermatitis, unspecified cause: Secondary | ICD-10-CM | POA: Diagnosis not present

## 2014-05-07 MED ORDER — ALPRAZOLAM 0.5 MG PO TABS: ORAL_TABLET | ORAL | Status: DC

## 2014-05-07 NOTE — Telephone Encounter (Signed)
Rx called to pharmacy

## 2014-05-07 NOTE — Telephone Encounter (Signed)
Pt will be flying and states dr gives her ALPRAZolam Duanne Moron) 0.5 MG tablet  When she travels.   Would like a refill of this  [facility] [address] [facility] [address]

## 2014-05-07 NOTE — Telephone Encounter (Signed)
Would use very sparingly. Xanax 0.5 mg one every 8 hours prn severe anxiety.  Take one hour prior to flying.  #30 with no refills.

## 2014-05-07 NOTE — Telephone Encounter (Signed)
Is it okay to refill. It is not currently on pt medication list.

## 2014-05-14 DIAGNOSIS — J209 Acute bronchitis, unspecified: Secondary | ICD-10-CM | POA: Diagnosis not present

## 2014-05-20 DIAGNOSIS — J189 Pneumonia, unspecified organism: Secondary | ICD-10-CM | POA: Diagnosis not present

## 2014-07-05 DIAGNOSIS — Z4789 Encounter for other orthopedic aftercare: Secondary | ICD-10-CM | POA: Diagnosis not present

## 2014-07-05 DIAGNOSIS — M71061 Abscess of bursa, right knee: Secondary | ICD-10-CM | POA: Diagnosis not present

## 2014-07-10 DIAGNOSIS — M7542 Impingement syndrome of left shoulder: Secondary | ICD-10-CM | POA: Diagnosis not present

## 2014-07-10 DIAGNOSIS — M47814 Spondylosis without myelopathy or radiculopathy, thoracic region: Secondary | ICD-10-CM | POA: Diagnosis not present

## 2014-07-10 DIAGNOSIS — M9901 Segmental and somatic dysfunction of cervical region: Secondary | ICD-10-CM | POA: Diagnosis not present

## 2014-07-10 DIAGNOSIS — M7521 Bicipital tendinitis, right shoulder: Secondary | ICD-10-CM | POA: Diagnosis not present

## 2014-07-10 DIAGNOSIS — M9902 Segmental and somatic dysfunction of thoracic region: Secondary | ICD-10-CM | POA: Diagnosis not present

## 2014-07-10 DIAGNOSIS — M47812 Spondylosis without myelopathy or radiculopathy, cervical region: Secondary | ICD-10-CM | POA: Diagnosis not present

## 2014-07-12 ENCOUNTER — Encounter: Payer: Self-pay | Admitting: Women's Health

## 2014-07-12 ENCOUNTER — Ambulatory Visit (INDEPENDENT_AMBULATORY_CARE_PROVIDER_SITE_OTHER): Payer: Medicare Other | Admitting: Women's Health

## 2014-07-12 VITALS — BP 128/80 | Ht 68.0 in | Wt 140.0 lb

## 2014-07-12 DIAGNOSIS — N644 Mastodynia: Secondary | ICD-10-CM

## 2014-07-12 NOTE — Progress Notes (Signed)
Patient ID: [name], female   DOB: [birth year], 69 y.o.   MRN: [id number] Presents with complaint of bilateral breast tenderness mostly outer quadrants of both breasts. Breast tenderness noted mostly with running, does wear a sports bra. No palpable changes or nodules. Has increased pushup repetitions/weight routine with exercise. Denies injury or increased caffeine in diet. No nipple discharge. Maternal grandmother breast cancer. Normal 3-D screening mammogram 01/2014. No HRT.  Exam: Appears well. Breast exam and sitting and lying position without visible retractions, dimpling. No palpable nodules or changes. Tenderness mostly outer aspect left side greater than right.  Mastodynia  Plan: Options reviewed. Will try Motrin 400 every 8 hours as needed, vitamin E twice daily, decrease exercise routine. Instructed to call if symptoms persist or increase will schedule diagnostic mammogram.

## 2014-07-12 NOTE — Patient Instructions (Signed)

## 2014-07-16 ENCOUNTER — Other Ambulatory Visit: Payer: Self-pay | Admitting: Family Medicine

## 2014-07-16 NOTE — Telephone Encounter (Signed)
Refill for 3 months. 

## 2014-07-16 NOTE — Telephone Encounter (Signed)
Last visit 11/22/13 Last refill 04/16/14 #30 2 refill

## 2014-07-17 DIAGNOSIS — M9901 Segmental and somatic dysfunction of cervical region: Secondary | ICD-10-CM | POA: Diagnosis not present

## 2014-07-17 DIAGNOSIS — M9902 Segmental and somatic dysfunction of thoracic region: Secondary | ICD-10-CM | POA: Diagnosis not present

## 2014-07-17 DIAGNOSIS — M47814 Spondylosis without myelopathy or radiculopathy, thoracic region: Secondary | ICD-10-CM | POA: Diagnosis not present

## 2014-07-17 DIAGNOSIS — M7521 Bicipital tendinitis, right shoulder: Secondary | ICD-10-CM | POA: Diagnosis not present

## 2014-07-17 DIAGNOSIS — M47812 Spondylosis without myelopathy or radiculopathy, cervical region: Secondary | ICD-10-CM | POA: Diagnosis not present

## 2014-07-17 DIAGNOSIS — M7542 Impingement syndrome of left shoulder: Secondary | ICD-10-CM | POA: Diagnosis not present

## 2014-07-19 ENCOUNTER — Encounter: Payer: Medicare Other | Admitting: Women's Health

## 2014-07-20 ENCOUNTER — Encounter: Payer: Self-pay | Admitting: Women's Health

## 2014-07-20 ENCOUNTER — Ambulatory Visit (INDEPENDENT_AMBULATORY_CARE_PROVIDER_SITE_OTHER): Payer: Medicare Other | Admitting: Women's Health

## 2014-07-20 VITALS — BP 110/80 | Ht 68.0 in | Wt 142.0 lb

## 2014-07-20 DIAGNOSIS — Z01419 Encounter for gynecological examination (general) (routine) without abnormal findings: Secondary | ICD-10-CM | POA: Diagnosis not present

## 2014-07-20 NOTE — Progress Notes (Signed)
[name] 1946-05-31 [id number]    History:    Presents for annual exam.  TAH with BSO on no HRT. Normal Pap and mammogram history. Osteopenia without elevated FRAX. Mitral valve prolapse on no medications. Negative colonoscopy 2012. Current on vaccines. Right knee bursitis. Has intermittent bilateral mastodynia for several months.  Past medical history, past surgical history, family history and social history were all reviewed and documented in the EPIC chart. Retired, place tenderness active lifestyle.  ROS:  A ROS was performed and pertinent positives and negatives are included.  Exam:  Filed Vitals:   07/20/14 1429  BP: 110/80    General appearance:  Normal Thyroid:  Symmetrical, normal in size, without palpable masses or nodularity. Respiratory  Auscultation:  Clear without wheezing or rhonchi Cardiovascular  Auscultation:  Regular rate, without rubs, murmurs or gallops  Edema/varicosities:  Not grossly evident Abdominal  Soft,nontender, without masses, guarding or rebound.  Liver/spleen:  No organomegaly noted  Hernia:  None appreciated  Skin  Inspection:  Grossly normal   Breasts: Examined lying and sitting.     Right: Without masses, retractions, discharge or axillary adenopathy.     Left: Without masses, retractions, discharge or axillary adenopathy. Gentitourinary   Inguinal/mons:  Normal without inguinal adenopathy  External genitalia:  Normal  BUS/Urethra/Skene's glands:  Normal  Vagina:  Normal  Cervix:  Absent  Adnexa/parametria:     Rt: Without masses or tenderness.   Lt: Without masses or tenderness.  Anus and perineum: Normal  Digital rectal exam: Normal sphincter tone without palpated masses or tenderness  Assessment/Plan:  68 y.o.MWF G3P3  for annual exam.     Intermittent bilateral mastodynia with normal exam TAH with BSO on no HRT Stable left lower lung nodule primary care manages MVP-no meds Labs primary care 2015 Osteopenia T score -1.8  left hip no elevated FRAX  Plan: Continue SBE's, report change or increase or change in mastodynia. Continue vitamin E twice daily, avoid caffeine. Continue active lifestyle, regular exercise, vitamin D 2000 daily encouraged. Home safety, fall prevention and importance of exercise reviewed. Continue sunscreen. No Pap, new screening guidelines reviewed.  Huel Cote [place], 5:12 PM 07/20/2014

## 2014-07-20 NOTE — Patient Instructions (Signed)

## 2014-07-21 LAB — URINALYSIS W MICROSCOPIC + REFLEX CULTURE
Bacteria, UA: NONE SEEN
Bilirubin Urine: NEGATIVE
Casts: NONE SEEN
Crystals: NONE SEEN
Glucose, UA: NEGATIVE mg/dL
HGB URINE DIPSTICK: NEGATIVE
Ketones, ur: NEGATIVE mg/dL
Leukocytes, UA: NEGATIVE
Nitrite: NEGATIVE
PH: 5.5 (ref 5.0–8.0)
Protein, ur: NEGATIVE mg/dL
Specific Gravity, Urine: 1.019 (ref 1.005–1.030)
Squamous Epithelial / LPF: NONE SEEN
UROBILINOGEN UA: 0.2 mg/dL (ref 0.0–1.0)

## 2014-07-24 DIAGNOSIS — M7521 Bicipital tendinitis, right shoulder: Secondary | ICD-10-CM | POA: Diagnosis not present

## 2014-07-24 DIAGNOSIS — M9902 Segmental and somatic dysfunction of thoracic region: Secondary | ICD-10-CM | POA: Diagnosis not present

## 2014-07-24 DIAGNOSIS — M47814 Spondylosis without myelopathy or radiculopathy, thoracic region: Secondary | ICD-10-CM | POA: Diagnosis not present

## 2014-07-24 DIAGNOSIS — M47812 Spondylosis without myelopathy or radiculopathy, cervical region: Secondary | ICD-10-CM | POA: Diagnosis not present

## 2014-07-24 DIAGNOSIS — M9901 Segmental and somatic dysfunction of cervical region: Secondary | ICD-10-CM | POA: Diagnosis not present

## 2014-07-24 DIAGNOSIS — M7542 Impingement syndrome of left shoulder: Secondary | ICD-10-CM | POA: Diagnosis not present

## 2014-09-06 DIAGNOSIS — L814 Other melanin hyperpigmentation: Secondary | ICD-10-CM | POA: Diagnosis not present

## 2014-09-06 DIAGNOSIS — D225 Melanocytic nevi of trunk: Secondary | ICD-10-CM | POA: Diagnosis not present

## 2014-09-06 DIAGNOSIS — L820 Inflamed seborrheic keratosis: Secondary | ICD-10-CM | POA: Diagnosis not present

## 2014-09-06 DIAGNOSIS — L570 Actinic keratosis: Secondary | ICD-10-CM | POA: Diagnosis not present

## 2014-09-06 DIAGNOSIS — Z08 Encounter for follow-up examination after completed treatment for malignant neoplasm: Secondary | ICD-10-CM | POA: Diagnosis not present

## 2014-09-06 DIAGNOSIS — Z85820 Personal history of malignant melanoma of skin: Secondary | ICD-10-CM | POA: Diagnosis not present

## 2014-10-04 DIAGNOSIS — M71061 Abscess of bursa, right knee: Secondary | ICD-10-CM | POA: Diagnosis not present

## 2014-10-04 DIAGNOSIS — Z4789 Encounter for other orthopedic aftercare: Secondary | ICD-10-CM | POA: Diagnosis not present

## 2014-10-08 ENCOUNTER — Encounter: Payer: Self-pay | Admitting: Family Medicine

## 2014-10-08 ENCOUNTER — Ambulatory Visit (INDEPENDENT_AMBULATORY_CARE_PROVIDER_SITE_OTHER): Payer: Medicare Other | Admitting: Family Medicine

## 2014-10-08 VITALS — BP 120/80 | HR 80 | Temp 97.6°F | Wt 140.0 lb

## 2014-10-08 DIAGNOSIS — J018 Other acute sinusitis: Secondary | ICD-10-CM | POA: Diagnosis not present

## 2014-10-08 DIAGNOSIS — I4891 Unspecified atrial fibrillation: Secondary | ICD-10-CM | POA: Diagnosis not present

## 2014-10-08 DIAGNOSIS — I499 Cardiac arrhythmia, unspecified: Secondary | ICD-10-CM | POA: Diagnosis not present

## 2014-10-08 MED ORDER — AMOXICILLIN-POT CLAVULANATE 875-125 MG PO TABS: 1.0000 | ORAL_TABLET | Freq: Two times a day (BID) | ORAL | Status: DC

## 2014-10-08 MED ORDER — METOPROLOL SUCCINATE ER 25 MG PO TB24: 25.0000 mg | ORAL_TABLET | Freq: Every day | ORAL | Status: DC

## 2014-10-08 MED ORDER — ZOLPIDEM TARTRATE 10 MG PO TABS: 10.0000 mg | ORAL_TABLET | Freq: Every evening | ORAL | Status: DC | PRN

## 2014-10-08 NOTE — Progress Notes (Signed)
Pre visit review using our clinic review tool, if applicable. No additional management support is needed unless otherwise documented below in the visit note. 

## 2014-10-08 NOTE — Patient Instructions (Signed)

## 2014-10-08 NOTE — Progress Notes (Signed)
Subjective:    Patient ID: [name], female    DOB: March 07, 1946, 69 y.o.   MRN: [id number]  HPI Patient here with concern for recurrent sinusitis. She has history of acute sinusitis issues. Current symptoms started about a week ago. Bilateral frontal and maxillary sinus pressure. She's had some mild intermittent headaches. No purulent secretions. No fever. Increased malaise. No alleviating factors  Chronic insomnia. Requesting refill of Ambien.  She has history of palpitations and has had previous cardiac workup. She has chronic heart murmur and mitral valve prolapse with mitral regurgitation. No chest pains. No syncope. No dyspnea.  No history of atrial fibrillation  Past Medical History  Diagnosis Date  . INSOMNIA, CHRONIC 03/29/2009  . Skin cancer of arm     Squamous  . MVP (mitral valve prolapse)     NO MEDS  . Osteopenia 01/2014    T score -1.8 FRAX 8.8%/1.3%  . Heart murmur     mild mvr  . PONV (postoperative nausea and vomiting)   . Prepatellar bursitis     RT KNEE  . History of melanoma excision   . History of palpitations    Past Surgical History  Procedure Laterality Date  . Appendectomy  1969  . Tonsillectomy  1980  . Abdominal hysterectomy  1980    WITH BSO/FIBROIDS  . Oophorectomy      BSO WITH ABDOMINAL HYSTERECTOMY  . Thyroid cyst excision  05-01-11    RIGHT  . Shoulder arthroscopy  2010    right  . Colonoscopy    . Diagnostic laparoscopy      expl  . Excision melanoma with sentinel lymph node biopsy Right 10/02/2013    Procedure: WIDE EXCISION MELANOMA RIGHT ARM WITH SENTINEL LYMPH NODE BIOPSY;  Surgeon: Shann Medal, MD;  Location: [facility];  Service: General;  Laterality: Right;  . Knee bursectomy Right 03/05/2014    Procedure: RIGHT KNEE BURSECTOMY;  Surgeon: Gearlean Alf, MD;  Location: [facility];  Service: Orthopedics;  Laterality: Right;    reports that she quit smoking about 44 years ago. Her smoking use included Cigarettes.  She has a 20 pack-year smoking history. She has never used smokeless tobacco. She reports that she drinks about 4.2 oz of alcohol per week. She reports that she does not use illicit drugs. family history includes Alcohol abuse in her father; Breast cancer in her maternal grandmother; Cancer in her father and maternal aunt; Diabetes in her mother. Allergies  Allergen Reactions  . Inderal [Propranolol Hcl]     SEVERE LIP AND SKIN DRYNESS  . Tape     Very sensitive skin-  . Latex Itching    Sensitive to latex      Review of Systems  Constitutional: Positive for fatigue.  HENT: Positive for congestion and sinus pressure.   Respiratory: Negative for shortness of breath.   Cardiovascular: Positive for palpitations. Negative for chest pain.  Neurological: Positive for headaches.       Objective:   Physical Exam  Constitutional: She appears well-developed and well-nourished.  HENT:  Right Ear: External ear normal.  Left Ear: External ear normal.  Mouth/Throat: Oropharynx is clear and moist.  Neck: Neck supple.  Cardiovascular:  Murmur heard. Irregular heart rhythm with rate around 110.  Pulmonary/Chest: Effort normal and breath sounds normal. No respiratory distress. She has no wheezes. She has no rales.  Musculoskeletal: She exhibits no edema.  Lymphadenopathy:    She has no cervical adenopathy.  Assessment & Plan:  #1 acute sinusitis symptoms. We've recommended observation and explained that with short duration of symptoms more likely viral. Start Augmentin 875 mg twice daily for any worsening or persistent symptoms #2 chronic insomnia. Sleep hygiene has been discussed previously. Refill Ambien for 6 months. #3 irregular heart rhythm. Obtain EKG. Rule out atrial fibrillation EKG does confirm likely atrial fibrillation with rate of about 110. Will start Toprol-XL 25 mg once daily and get back in to see cardiology. Her CHADS2 score would be 0.  She will start baby  aspirin 1 daily.  She complained of previous intolerance of Inderal but she states she had "dry lips ". She does not describe any clear allergic reaction

## 2014-10-09 ENCOUNTER — Telehealth: Payer: Self-pay | Admitting: Cardiology

## 2014-10-09 ENCOUNTER — Other Ambulatory Visit: Payer: Self-pay | Admitting: Family Medicine

## 2014-10-09 DIAGNOSIS — I059 Rheumatic mitral valve disease, unspecified: Secondary | ICD-10-CM

## 2014-10-09 MED ORDER — ALPRAZOLAM 0.5 MG PO TABS: ORAL_TABLET | ORAL | Status: DC

## 2014-10-09 MED ORDER — FLUCONAZOLE 150 MG PO TABS: 150.0000 mg | ORAL_TABLET | Freq: Once | ORAL | Status: DC

## 2014-10-09 NOTE — Telephone Encounter (Signed)
Pt advised, verbalized understanding. Pt requesting to have echo 4/22, 4/25 or 4/26 prior to appt with Dr Aundra Dubin 10/18/14.   I will forward to [facility] to contact pt to schedule echo.

## 2014-10-09 NOTE — Telephone Encounter (Signed)
Pt seen yesterday and received antibioticas but would also like rx for diflucan [facility]  Also pt has only 3 / ALPRAZolam (XANAX) 0.5 MG tablet Will be flying and needs a refill

## 2014-10-09 NOTE — Telephone Encounter (Signed)
Fluconazole 150 mg one dose. Refill Xanax 0.5 mg #30.  Should ONLY take prior to flying. , not regularly.

## 2014-10-09 NOTE — Telephone Encounter (Signed)
Rxs done and I called the pt and informed her of this.

## 2014-10-09 NOTE — Telephone Encounter (Signed)
Per Dr McLean---pt should be scheduled for an echocardiogram this week then see him in the office next week.   Pt has been scheduled to see Dr Aundra Dubin 10/18/14 at 4:15PM, order has been placed in Epic for echo. I will forward to [facility] to schedule echo this week prior to appt with Dr Aundra Dubin 10/18/14

## 2014-10-09 NOTE — Telephone Encounter (Signed)
New Message       Pt calling stating that she saw Dr. [name] this morning and he did and EKG and is supposed to be sending it to Dr. Aundra Dubin to review. Pt wants to know if she should go ahead and schedule an appt w/ Dr. Aundra Dubin or PA now or if she should wait until Dr. Aundra Dubin looks at the EKG and sees what he thinks. Please call back and advise.

## 2014-10-15 ENCOUNTER — Other Ambulatory Visit: Payer: Self-pay

## 2014-10-15 ENCOUNTER — Telehealth: Payer: Self-pay | Admitting: Family Medicine

## 2014-10-15 ENCOUNTER — Ambulatory Visit (HOSPITAL_COMMUNITY): Payer: Medicare Other | Attending: Cardiology | Admitting: Radiology

## 2014-10-15 ENCOUNTER — Other Ambulatory Visit: Payer: Self-pay | Admitting: Family Medicine

## 2014-10-15 DIAGNOSIS — I059 Rheumatic mitral valve disease, unspecified: Secondary | ICD-10-CM

## 2014-10-15 NOTE — Progress Notes (Signed)
Echocardiogram performed.  

## 2014-10-15 NOTE — Telephone Encounter (Signed)
Husband said insurance company will not pay for the following med  zolpidem (AMBIEN) 10 MG tablet Insurance company only authorize 90 pills for a year.

## 2014-10-17 ENCOUNTER — Encounter: Payer: Self-pay | Admitting: *Deleted

## 2014-10-17 NOTE — Telephone Encounter (Signed)
Quantity Limit Exception for zolpidem was denied.  Patient's plan considers medication an high risk and can only receive 90 per year.  Temazepam 7.5 and 15 mg are on a lower tier and a quantity limit exception may be approved if requested.

## 2014-10-18 ENCOUNTER — Ambulatory Visit (INDEPENDENT_AMBULATORY_CARE_PROVIDER_SITE_OTHER): Payer: Medicare Other | Admitting: Cardiology

## 2014-10-18 ENCOUNTER — Encounter: Payer: Self-pay | Admitting: Cardiology

## 2014-10-18 VITALS — BP 124/70 | HR 65 | Ht 68.0 in | Wt 140.0 lb

## 2014-10-18 DIAGNOSIS — I480 Paroxysmal atrial fibrillation: Secondary | ICD-10-CM

## 2014-10-18 DIAGNOSIS — I340 Nonrheumatic mitral (valve) insufficiency: Secondary | ICD-10-CM

## 2014-10-18 HISTORY — DX: Paroxysmal atrial fibrillation: I48.0

## 2014-10-18 MED ORDER — APIXABAN 5 MG PO TABS: 5.0000 mg | ORAL_TABLET | Freq: Two times a day (BID) | ORAL | Status: DC

## 2014-10-18 MED ORDER — METOPROLOL SUCCINATE ER 25 MG PO TB24: 25.0000 mg | ORAL_TABLET | Freq: Every day | ORAL | Status: DC

## 2014-10-18 MED ORDER — TEMAZEPAM 15 MG PO CAPS: 15.0000 mg | ORAL_CAPSULE | Freq: Every evening | ORAL | Status: DC | PRN

## 2014-10-18 NOTE — Telephone Encounter (Signed)
Pt is out the of the Ambien. She would like to know if you would prescribe Temazepam. Pt states that she does need a sleeping med without a the medication she does not sleep

## 2014-10-18 NOTE — Telephone Encounter (Signed)
Rx called into pharmacy. Left message on VM.

## 2014-10-18 NOTE — Progress Notes (Signed)
Patient ID: [name], female   DOB: 1946-06-09, 69 y.o.   MRN: [id number] PCP: Dr. [name]  69 yo with history of mitral valve prolapse and mitral regurgitation presents for followup.  Workup was done for mitral regurgitation initially in 2012.  She has bileaflet MV prolapse.  It was moderate to severe by TEE in 6/12.  ETT showed excellent exercise tolerance with no evidence for exercise limitation from MR.  We planned to continue monitoring her mitral regurgitation periodically.  Patient was then admitted in 10/12 with severe chest pain while working in her yard.  She had a left heart cath showing only mild luminal irregularities. Echo showed moderate mitral regurgitation.  3 week event monitor showed only PACs.    Patient developed sinus pressure/drainage concerning for sinusitis about a week ago.  During this illness, she noted her heart thumping/beating irregularly.  She developed dyspnea with vigorous walking or vacuuming.  No syncope, lightheadedness, or chest pain.  Symptoms persisted, so she saw Dr [name] last week.  She was found to be atrial fibrillation with mild RVR.  She was started on Toprol XL.  At some point, her heart went back to NSR. She is in NSR today.  She feels fine currently.  I had her get a repeat echo.  EF remains normal and mitral regurgitation remains moderate.     Labs (5/12): TSH normal, LDL 68, HDL 58, cardiac enzymes negative x 3 Labs (4/14): K 4.2, creatinine 0.8, TSH normal, BNP 38 Labs (4/15): LDL 100, HDL 93 Labs (9/15): 37.4  ECG: NSR, normal  PMH:  1. Mitral valve prolapse: Known since teenager.  Echo (5/12): EF 60%, normal LV size, grade II diastolic dysfunction, myxomatous mitral valve with bileaflet prolapse and moderate to severe mitral regurgitation, mild to moderate TR, PA systolic pressure 39 mmHg.  TEE (6/12) with EF 60%, moderate to severe MR with bileaflet prolapse and no systolic flow reversal in the pulmonary vein doppler signal, mild to  moderate LAE.  ETT for exercise capacity (7/12): 12'19" on treadmill, no evidence of symptomatic limitation from mitral regurgitation. Echo (10/12): EF 60-65%, severe MVP with moderate MR.  Echo (4/14) with EF 60-65%, moderate MR with bileaflet MVP and normal RV.  Echo (4/16) with EF 55-60%, MVP with moderate mitral regurgitation, severe LAE.  2. Right shoulder surgery 2012.  3. Pulmonary nodule: followed by Dr. [name] 4. Thyroid nodule s/p biopsy: benign.  5. TAH-BSO due to endometriosis.  6. Appendectomy.  7. Chest pain: Lexiscan myoview (6/12):  EF 63%, no ischemia or infarction.  Left heart cath (10/12): Mild luminal irregularities only.  8. Atrial fibrillation: Paroxysmal, 1st noted in 4/16.   SH: Quit smoking at age 43.  Moved to [place] from [place] several years ago.  Married.  2 glasses wine/night.   FH: No CAD or valvular disease.   ROS: all systems reviewed and negative except as per HPI.   Current Outpatient Prescriptions  Medication Sig Dispense Refill  . ALPRAZolam (XANAX) 0.5 MG tablet Take one tablet every 8 hours as needed severe anxiety. Take one hour prior to flying. 30 tablet 0  . metoprolol succinate (TOPROL-XL) 25 MG 24 hr tablet Take 1 tablet (25 mg total) by mouth daily. 90 tablet 3  . apixaban (ELIQUIS) 5 MG TABS tablet Take 1 tablet (5 mg total) by mouth 2 (two) times daily. 60 tablet 2  . temazepam (RESTORIL) 15 MG capsule Take 1 capsule (15 mg total) by mouth at bedtime as needed for  sleep. 30 capsule 0   No current facility-administered medications for this visit.    BP 124/70 mmHg  Pulse 65  Ht 5\' 8"  (1.727 m)  Wt 140 lb (63.504 kg)  BMI 21.29 kg/m2  LMP 06/22/1977 General: NAD Neck: No JVD, no thyromegaly or thyroid nodule.  Lungs: Clear to auscultation bilaterally with normal respiratory effort. CV: Nondisplaced PMI.  Heart regular S1/S2, no V3/Z4, 2/6 late systolic murmur at apex.  No peripheral edema.  No carotid bruit.  Normal pedal pulses.   Abdomen: Soft, nontender, no hepatosplenomegaly, no distention.  Neurologic: Alert and oriented x 3.  Psych: Normal affect. Extremities: No clubbing or cyanosis.   Assessment/Plan: 1. Mitral regurgitation: Moderate MR with MVP, no significant change from prior.  2. Atrial fibrillation: Paroxysmal.  Now back in NSR.  Patient has had frequent PACs in the past, this is first documentation of atrial fibrillation. She has severe LAE with moderate MR, so has substrate for atrial fibrillation.  The sinusitis may have been a triggering event.  CHADSVASC = 2 (gender, age).  - Stop ASA, start Eliquis 5 mg bid.  Continue Toprol XL.  - I will arrange for 30 day monitor to assess for atrial fibrillation burden.  Question will be need to go on an antiarrhythmic like dronedarone.   - Keep ETOH no more than 2 drinks a day.   - She does not screen positive for OSA.  - Check TSH.   [name] 10/18/2014

## 2014-10-18 NOTE — Patient Instructions (Addendum)
Medication Instructions:  Stop aspirin.  Start Eliquis 5mg  two times a day.  Stop ibuprofen, use tylenol instead.   Labwork: BMET/CBCd/TSH today.  Testing/Procedures: Your physician has recommended that you wear an event monitor. Event monitors are medical devices that record the heart's electrical activity. Doctors most often [place] these monitors to diagnose arrhythmias. Arrhythmias are problems with the speed or rhythm of the heartbeat. The monitor is a small, portable device. You can wear one while you do your normal daily activities. This is usually used to diagnose what is causing palpitations/syncope (passing out). 30 day monitor    Follow-Up: Your physician recommends that you schedule a follow-up appointment in: 1 month in CVRR since you are starting ELiquis.  Your physician recommends that you schedule a follow-up appointment in: 6-8 weeks with Dr Aundra Dubin.

## 2014-10-18 NOTE — Telephone Encounter (Signed)
Left message for patient to return call.

## 2014-10-18 NOTE — Telephone Encounter (Signed)
Temazepam 15 mg po qhs prn #30

## 2014-10-18 NOTE — Telephone Encounter (Signed)
Let pt know.  Would be ideal if she could taper off.

## 2014-10-19 LAB — CBC WITH DIFFERENTIAL/PLATELET
Basophils Absolute: 0 10*3/uL (ref 0.0–0.1)
Basophils Relative: 0.7 % (ref 0.0–3.0)
Eosinophils Absolute: 0.2 10*3/uL (ref 0.0–0.7)
Eosinophils Relative: 3.8 % (ref 0.0–5.0)
HEMATOCRIT: 37.9 % (ref 36.0–46.0)
Hemoglobin: 13.4 g/dL (ref 12.0–15.0)
LYMPHS ABS: 1.8 10*3/uL (ref 0.7–4.0)
Lymphocytes Relative: 35.8 % (ref 12.0–46.0)
MCHC: 35.5 g/dL (ref 30.0–36.0)
MCV: 97.7 fl (ref 78.0–100.0)
MONOS PCT: 7.1 % (ref 3.0–12.0)
Monocytes Absolute: 0.4 10*3/uL (ref 0.1–1.0)
NEUTROS ABS: 2.6 10*3/uL (ref 1.4–7.7)
NEUTROS PCT: 52.6 % (ref 43.0–77.0)
Platelets: 257 10*3/uL (ref 150.0–400.0)
RBC: 3.88 Mil/uL (ref 3.87–5.11)
RDW: 14.2 % (ref 11.5–15.5)
WBC: 5 10*3/uL (ref 4.0–10.5)

## 2014-10-19 LAB — BASIC METABOLIC PANEL
BUN: 20 mg/dL (ref 6–23)
CALCIUM: 9.6 mg/dL (ref 8.4–10.5)
CO2: 16 mEq/L — ABNORMAL LOW (ref 19–32)
Chloride: 107 mEq/L (ref 96–112)
Creatinine, Ser: 0.97 mg/dL (ref 0.40–1.20)
GFR: 60.95 mL/min (ref >60.00)
Glucose, Bld: 90 mg/dL (ref 70–99)
POTASSIUM: 4.4 meq/L (ref 3.5–5.1)
SODIUM: 138 meq/L (ref 135–145)

## 2014-10-19 LAB — TSH: TSH: 2.59 u[IU]/mL (ref 0.35–4.50)

## 2014-10-30 ENCOUNTER — Telehealth: Payer: Self-pay | Admitting: Family Medicine

## 2014-10-30 NOTE — Telephone Encounter (Signed)
Quantity Limit Exception for zolpidem was denied. Patient's plan considers medication an high risk and can only receive 90 per year. Temazepam 7.5 and 15 mg are on a lower tier and a quantity limit exception may be approved if requested.

## 2014-10-30 NOTE — Telephone Encounter (Signed)
Pt states the temazepam is giving her nightmares and just not working. Pt would like to switch back to zolpidem (AMBIEN) 10 MG tablet   [facility]

## 2014-10-31 ENCOUNTER — Encounter: Payer: Self-pay | Admitting: *Deleted

## 2014-10-31 ENCOUNTER — Ambulatory Visit (INDEPENDENT_AMBULATORY_CARE_PROVIDER_SITE_OTHER): Payer: Medicare Other

## 2014-10-31 DIAGNOSIS — I480 Paroxysmal atrial fibrillation: Secondary | ICD-10-CM

## 2014-10-31 MED ORDER — ZOLPIDEM TARTRATE 10 MG PO TABS: 10.0000 mg | ORAL_TABLET | Freq: Every evening | ORAL | Status: DC | PRN

## 2014-10-31 NOTE — Telephone Encounter (Signed)
Ambien 10 mg 1 daily at bedtime when necessary #90 with no refill

## 2014-10-31 NOTE — Telephone Encounter (Signed)
May switch back to Ambien and I guess she will just have to pay out of pocket.

## 2014-10-31 NOTE — Telephone Encounter (Signed)
Pt is only allowed #90 for a year is it okay to order #90 0 refills. I need  directions.

## 2014-10-31 NOTE — Telephone Encounter (Signed)
Left message on Vm for patient.. Rx called into pharmacy

## 2014-10-31 NOTE — Progress Notes (Signed)
Patient ID: [name], female   DOB: Dec 21, 1945, 69 y.o.   MRN: [id number] [employer] 30 day cardiac event monitor applied to patient.

## 2014-11-01 ENCOUNTER — Telehealth: Payer: Self-pay | Admitting: Internal Medicine

## 2014-11-01 NOTE — Telephone Encounter (Signed)
[name] had an episode of atrial fibrillation with rapid ventricular response with a heart rate range of 54-143 with an average heart rate of 100 bpm. Unclear if this is persistent at this moment as [employer] is unable to pull up an active report at this time.

## 2014-11-02 ENCOUNTER — Encounter: Payer: Medicare Other | Admitting: Family Medicine

## 2014-11-20 ENCOUNTER — Ambulatory Visit: Payer: Medicare Other | Admitting: Pharmacist

## 2014-11-20 ENCOUNTER — Ambulatory Visit (INDEPENDENT_AMBULATORY_CARE_PROVIDER_SITE_OTHER): Payer: Medicare Other | Admitting: *Deleted

## 2014-11-20 VITALS — Wt 138.9 lb

## 2014-11-20 DIAGNOSIS — I4891 Unspecified atrial fibrillation: Secondary | ICD-10-CM | POA: Diagnosis not present

## 2014-11-20 LAB — BASIC METABOLIC PANEL
BUN: 19 mg/dL (ref 6–23)
CALCIUM: 9.8 mg/dL (ref 8.4–10.5)
CO2: 21 mEq/L (ref 19–32)
CREATININE: 0.86 mg/dL (ref 0.40–1.20)
Chloride: 106 mEq/L (ref 96–112)
GFR: 70.01 mL/min (ref >60.00)
GLUCOSE: 92 mg/dL (ref 70–99)
Potassium: 4.1 mEq/L (ref 3.5–5.1)
SODIUM: 137 meq/L (ref 135–145)

## 2014-11-20 LAB — CBC
HCT: 38.3 % (ref 36.0–46.0)
Hemoglobin: 13.1 g/dL (ref 12.0–15.0)
MCHC: 34.3 g/dL (ref 30.0–36.0)
MCV: 99.6 fl (ref 78.0–100.0)
Platelets: 219 10*3/uL (ref 150.0–400.0)
RBC: 3.84 Mil/uL — ABNORMAL LOW (ref 3.87–5.11)
RDW: 13.5 % (ref 11.5–15.5)
WBC: 4.6 10*3/uL (ref 4.0–10.5)

## 2014-11-20 NOTE — Progress Notes (Signed)
Pt was started on Eliquis for AFIB on 10/18/14 by Dr. Aundra Dubin.    Reviewed patients medication list.  Pt is not currently on any combined P-gp and strong CYP3A4 inhibitors/inducers (ketoconazole, traconazole, ritonavir, carbamazepine, phenytoin, rifampin, St. John's wort).  Reviewed labs: SCr 0.86, Hgb 13.1, HCT 38.3, Weight 69 years old.  Dose appropriate based on dosing criteria.    A full discussion of the nature of anticoagulants has been carried out.  A benefit/risk analysis has been presented to the patient, so that they understand the justification for choosing anticoagulation with Eliquis at this time.  The need for compliance is stressed.  Pt is aware to take the medication twice daily.  Side effects of potential bleeding are discussed, including unusual colored urine or stools, coughing up blood or coffee ground emesis, nose bleeds or serious fall or head trauma.  Discussed signs and symptoms of stroke. The patient should avoid any OTC items containing aspirin or ibuprofen.  Avoid alcohol consumption.   Call if any signs of abnormal bleeding.  Discussed financial obligations and resolved any difficulty in obtaining medication.     Patient has been taking Eliquis 5mg  once a day for the past 2 weeks due to feeling dizzy, advised to start taking Eliquis 5 mg twice a day and to follow up with [facility] or Dr. Aundra Dubin with any issues. She verbalized understanding.   Called patient and advised that she will need to follow up in the office in 1 month due to not taking her Eliquis twice a day as prescribed.

## 2014-12-13 DIAGNOSIS — M71061 Abscess of bursa, right knee: Secondary | ICD-10-CM | POA: Diagnosis not present

## 2014-12-19 ENCOUNTER — Ambulatory Visit (INDEPENDENT_AMBULATORY_CARE_PROVIDER_SITE_OTHER): Payer: Medicare Other | Admitting: Pharmacist

## 2014-12-19 ENCOUNTER — Ambulatory Visit (INDEPENDENT_AMBULATORY_CARE_PROVIDER_SITE_OTHER): Payer: Medicare Other | Admitting: Cardiology

## 2014-12-19 VITALS — BP 124/64 | HR 76 | Ht 68.0 in | Wt 139.0 lb

## 2014-12-19 DIAGNOSIS — I340 Nonrheumatic mitral (valve) insufficiency: Secondary | ICD-10-CM

## 2014-12-19 DIAGNOSIS — I480 Paroxysmal atrial fibrillation: Secondary | ICD-10-CM | POA: Diagnosis not present

## 2014-12-19 MED ORDER — TRAMADOL HCL 50 MG PO TABS: 50.0000 mg | ORAL_TABLET | Freq: Two times a day (BID) | ORAL | Status: DC | PRN

## 2014-12-19 NOTE — Progress Notes (Signed)
Pt was started on Eliquis for AFIB on 10/18/14 by Dr. Aundra Dubin.    Reviewed patients medication list.  Pt is not currently on any combined P-gp and strong CYP3A4 inhibitors/inducers (ketoconazole, traconazole, ritonavir, carbamazepine, phenytoin, rifampin, St. John's wort).  Reviewed labs: SCr 0.86, Hgb 13.1, HCT 38.3, Weight 69 years old.  Dose appropriate based on dosing criteria.    A full discussion of the nature of anticoagulants has been carried out.  A benefit/risk analysis has been presented to the patient, so that they understand the justification for choosing anticoagulation with Eliquis at this time.  The need for compliance is stressed.  Pt is aware to take the medication twice daily.  Side effects of potential bleeding are discussed, including unusual colored urine or stools, coughing up blood or coffee ground emesis, nose bleeds or serious fall or head trauma.  Discussed signs and symptoms of stroke. The patient should avoid any OTC items containing aspirin or ibuprofen.  Avoid alcohol consumption.   Call if any signs of abnormal bleeding.  Discussed financial obligations and resolved any difficulty in obtaining medication.     Pt has started taking Eliquis 5mg  BID since last visit.  She states the dizziness is not as bad but she still has some.  She also complains of a dull headache that is not relieved with Tylenol.  She is seeing Dr. Aundra Dubin today so suggested following up with him.    Given she is now compliant with Eliquis, no additional need for follow up.

## 2014-12-19 NOTE — Patient Instructions (Signed)
Medication Instructions:  DO NOT USE NSAIDS-non steroidal medications like ibuprofen.  Use tramadol 50mg  two times a day as needed for severe pain. Please contact your primary care doctor if you need refills for this.  Labwork: None today  Testing/Procedures: None today  Follow-Up: Your physician wants you to follow-up in: 6 months with Dr Aundra Dubin. (December 2016). You will receive a reminder letter in the mail two months in advance. If you don't receive a letter, please call our office to schedule the follow-up appointment.   Thank you for choosing [facility]!!

## 2014-12-20 ENCOUNTER — Other Ambulatory Visit: Payer: Self-pay | Admitting: Women's Health

## 2014-12-20 ENCOUNTER — Encounter: Payer: Self-pay | Admitting: Cardiology

## 2014-12-20 DIAGNOSIS — Z1231 Encounter for screening mammogram for malignant neoplasm of breast: Secondary | ICD-10-CM

## 2014-12-20 NOTE — Progress Notes (Signed)
Patient ID: [name], female   DOB: 09-30-1945, 69 y.o.   MRN: [id number] PCP: Dr. [name]  69 yo with history of mitral valve prolapse and mitral regurgitation presents for followup.  Workup was done for mitral regurgitation initially in 2012.  She has bileaflet MV prolapse.  It was moderate to severe by TEE in 6/12.  ETT showed excellent exercise tolerance with no evidence for exercise limitation from MR.  We planned to continue monitoring her mitral regurgitation periodically.  Patient was then admitted in 10/12 with severe chest pain while working in her yard.  She had a left heart cath showing only mild luminal irregularities. Echo showed moderate mitral regurgitation.  3 week event monitor showed only PACs.    Patient developed sinus pressure/drainage concerning for sinusitis in 5/16.  During this illness, she noted her heart thumping/beating irregularly.  She developed dyspnea with vigorous walking or vacuuming.  No syncope, lightheadedness, or chest pain.  Symptoms persisted, so she saw Dr [name].  She was found to be atrial fibrillation with mild RVR.  She was started on Toprol XL.  She converted back to NSR and is in NSR today.  She feels fine currently.  She is very active and exercises daily with no exertional dyspnea.  No chest pain, no lightheadedness or tachypalpitations.  I had her get a repeat echo.  EF remains normal and mitral regurgitation remains moderate.    She wore a 30 day event monitor in 5/16 that showed a total of 6 minutes atrial fibrillation.    Labs (5/12): TSH normal, LDL 68, HDL 58, cardiac enzymes negative x 3 Labs (4/14): K 4.2, creatinine 0.8, TSH normal, BNP 38 Labs (4/15): LDL 100, HDL 93 Labs (9/15): 37.4 Labs (4/16): TSH normal Labs (5/16): K 4.1, creatinine 0.86, HCT 38.3  PMH:  1. Mitral valve prolapse: Known since teenager.  Echo (5/12): EF 60%, normal LV size, grade II diastolic dysfunction, myxomatous mitral valve with bileaflet prolapse and  moderate to severe mitral regurgitation, mild to moderate TR, PA systolic pressure 39 mmHg.  TEE (6/12) with EF 60%, moderate to severe MR with bileaflet prolapse and no systolic flow reversal in the pulmonary vein doppler signal, mild to moderate LAE.  ETT for exercise capacity (7/12): 12'19" on treadmill, no evidence of symptomatic limitation from mitral regurgitation. Echo (10/12): EF 60-65%, severe MVP with moderate MR.  Echo (4/14) with EF 60-65%, moderate MR with bileaflet MVP and normal RV.  Echo (4/16) with EF 55-60%, MVP with moderate mitral regurgitation, severe LAE.  2. Right shoulder surgery 2012.  3. Pulmonary nodule: followed by Dr. [name] 4. Thyroid nodule s/p biopsy: benign.  5. TAH-BSO due to endometriosis.  6. Appendectomy.  7. Chest pain: Lexiscan myoview (6/12):  EF 63%, no ischemia or infarction.  Left heart cath (10/12): Mild luminal irregularities only.  8. Atrial fibrillation: Paroxysmal, 1st noted in 4/16.  Event monitor 5/16 with 6 minutes of atrial fibrillation noted.   SH: Quit smoking at age 69.  Moved to [place] from [place] several years ago.  Married.  2 glasses wine/night.   FH: No CAD or valvular disease.   ROS: all systems reviewed and negative except as per HPI.   Current Outpatient Prescriptions  Medication Sig Dispense Refill  . ALPRAZolam (XANAX) 0.5 MG tablet Take one tablet every 8 hours as needed severe anxiety. Take one hour prior to flying. 30 tablet 0  . apixaban (ELIQUIS) 5 MG TABS tablet Take 1 tablet (5 mg total)  by mouth 2 (two) times daily. 60 tablet 2  . metoprolol succinate (TOPROL-XL) 25 MG 24 hr tablet Take 1 tablet (25 mg total) by mouth daily. 90 tablet 3  . traMADol (ULTRAM) 50 MG tablet Take 1 tablet (50 mg total) by mouth 2 (two) times daily as needed. 30 tablet 0  . zolpidem (AMBIEN) 10 MG tablet Take 1 tablet (10 mg total) by mouth at bedtime as needed for sleep. 90 tablet 0   No current facility-administered medications for this  visit.    BP 124/64 mmHg  Pulse 76  Ht 5\' 8"  (1.727 m)  Wt 139 lb (63.05 kg)  BMI 21.14 kg/m2  SpO2 99%  LMP 06/22/1977 General: NAD Neck: No JVD, no thyromegaly or thyroid nodule.  Lungs: Clear to auscultation bilaterally with normal respiratory effort. CV: Nondisplaced PMI.  Heart regular S1/S2, no Z6/X0, 2/6 late systolic murmur at apex.  No peripheral edema.  No carotid bruit.  Normal pedal pulses.  Abdomen: Soft, nontender, no hepatosplenomegaly, no distention.  Neurologic: Alert and oriented x 3.  Psych: Normal affect. Extremities: No clubbing or cyanosis.   Assessment/Plan: 1. Mitral regurgitation: Moderate MR with MVP, no significant change from prior. Repeat echo 4/17.  2. Atrial fibrillation: Paroxysmal.  Now back in NSR. She has severe LAE with moderate MR, so has substrate for atrial fibrillation.  The sinusitis may have been a triggering event.  CHADSVASC = 2 (gender, age).  30 day monitor showed rare atrial fibrillation.  - Continue Eliquis 5 mg bid.  Continue Toprol XL.  - Atrial fibrillation does not occur frequently, so I do not think that there is a role for an antiarrhythmic.  - Keep ETOH to no more than 2 drinks a day.   - She does not screen positive for OSA.    [name] 12/20/2014

## 2014-12-20 NOTE — Addendum Note (Signed)
Addended by: Katrine Coho on: 12/20/2014 08:25 AM   Modules accepted: Orders

## 2014-12-25 DIAGNOSIS — H2513 Age-related nuclear cataract, bilateral: Secondary | ICD-10-CM | POA: Diagnosis not present

## 2014-12-25 DIAGNOSIS — H18413 Arcus senilis, bilateral: Secondary | ICD-10-CM | POA: Diagnosis not present

## 2015-01-03 ENCOUNTER — Encounter: Payer: Medicare Other | Admitting: Family Medicine

## 2015-01-04 ENCOUNTER — Ambulatory Visit (INDEPENDENT_AMBULATORY_CARE_PROVIDER_SITE_OTHER): Payer: Medicare Other | Admitting: Family Medicine

## 2015-01-04 ENCOUNTER — Encounter: Payer: Self-pay | Admitting: Family Medicine

## 2015-01-04 ENCOUNTER — Telehealth: Payer: Self-pay | Admitting: Family Medicine

## 2015-01-04 VITALS — BP 100/78 | HR 91 | Temp 97.7°F | Ht 66.75 in | Wt 138.9 lb

## 2015-01-04 DIAGNOSIS — Z0000 Encounter for general adult medical examination without abnormal findings: Secondary | ICD-10-CM | POA: Diagnosis not present

## 2015-01-04 DIAGNOSIS — Z23 Encounter for immunization: Secondary | ICD-10-CM | POA: Diagnosis not present

## 2015-01-04 NOTE — Progress Notes (Signed)
Subjective:    Patient ID: [name], female    DOB: 1945-08-20, 69 y.o.   MRN: [id number]  HPI  patient here for Medicare wellness exam. Recent diagnosis of atrial fibrillation. She now takes Eliquis.   Followed by cardiology. Also history of melanoma right arm and is followed every 3 months with dermatology. She continues see gynecologist yearly. Previous hysterectomy. She gets yearly mammograms. She gets flu shots regularly. Other immunizations are up-to-date. Last colonoscopy reported about 7 years ago  Past Medical History  Diagnosis Date  . INSOMNIA, CHRONIC 03/29/2009  . Skin cancer of arm     Squamous  . MVP (mitral valve prolapse)     NO MEDS  . Osteopenia 01/2014    T score -1.8 FRAX 8.8%/1.3%  . Heart murmur     mild mvr  . PONV (postoperative nausea and vomiting)   . Prepatellar bursitis     RT KNEE  . History of melanoma excision   . History of palpitations    Past Surgical History  Procedure Laterality Date  . Appendectomy  1969  . Tonsillectomy  1980  . Abdominal hysterectomy  1980    WITH BSO/FIBROIDS  . Oophorectomy      BSO WITH ABDOMINAL HYSTERECTOMY  . Thyroid cyst excision  05-01-11    RIGHT  . Shoulder arthroscopy  2010    right  . Colonoscopy    . Diagnostic laparoscopy      expl  . Excision melanoma with sentinel lymph node biopsy Right 10/02/2013    Procedure: WIDE EXCISION MELANOMA RIGHT ARM WITH SENTINEL LYMPH NODE BIOPSY;  Surgeon: Shann Medal, MD;  Location: [place];  Service: General;  Laterality: Right;  . Knee bursectomy Right 03/05/2014    Procedure: RIGHT KNEE BURSECTOMY;  Surgeon: Gearlean Alf, MD;  Location: [facility];  Service: Orthopedics;  Laterality: Right;    reports that she quit smoking about 44 years ago. Her smoking use included Cigarettes. She has a 20 pack-year smoking history. She has never used smokeless tobacco. She reports that she drinks about 4.2 oz of alcohol per week. She reports that she does not  use illicit drugs. family history includes Alcohol abuse in her father; Breast cancer in her maternal grandmother; Diabetes type II in her mother; Ovarian cancer in her maternal aunt; Throat cancer in her father. Allergies  Allergen Reactions  . Inderal [Propranolol Hcl]     SEVERE LIP AND SKIN DRYNESS  . Tape     Very sensitive skin-  . Latex Itching    Sensitive to latex   1.  Risk factors based on Past Medical , Social, and Family history reviewed and as indicated above with no changes 2.  Limitations in physical activities None.  No recent falls. 3.  Depression/mood No active depression or anxiety issues 4.  Hearing No defiits 5.  ADLs independent in all. 6.  Cognitive function (orientation to time and place, language, writing, speech,memory) no short or long term memory issues.  Language and judgement intact. 7.  Home Safety no issues 8.  Height, weight, and visual acuity.all stable. 9.  Counseling discussed regular weight bearing exercise and adequate calcium and Vit D. 10. Recommendation of preventive services. Yearly flu vaccine.  Pneumovax given. 11. Labs based on risk factors-none. 12. Care Plan as above. 13. Other Providers Dr Jones-Dermatology, Dr McLean-cardiology 41. Written schedule of screening/prevention services given to patient.    Review of Systems  Constitutional: Negative for fever,  activity change, appetite change, fatigue and unexpected weight change.  HENT: Negative for ear pain, hearing loss, sore throat and trouble swallowing.   Eyes: Negative for visual disturbance.  Respiratory: Negative for cough and shortness of breath.   Cardiovascular: Negative for chest pain and palpitations.  Gastrointestinal: Negative for abdominal pain, diarrhea, constipation and blood in stool.  Genitourinary: Negative for dysuria and hematuria.  Musculoskeletal: Negative for myalgias, back pain and arthralgias.  Skin: Negative for rash.  Neurological: Negative for  dizziness, syncope and headaches.  Hematological: Negative for adenopathy.  Psychiatric/Behavioral: Negative for confusion and dysphoric mood.       Objective:   Physical Exam  Constitutional: She is oriented to person, place, and time. She appears well-developed and well-nourished.  HENT:  Head: Normocephalic and atraumatic.  Eyes: EOM are normal. Pupils are equal, round, and reactive to light.  Neck: Normal range of motion. Neck supple. No thyromegaly present.  Cardiovascular: Normal rate, regular rhythm and normal heart sounds.   No murmur heard. Pulmonary/Chest: Breath sounds normal. No respiratory distress. She has no wheezes. She has no rales.  Abdominal: Soft. Bowel sounds are normal. She exhibits no distension and no mass. There is no tenderness. There is no rebound and no guarding.  Musculoskeletal: Normal range of motion. She exhibits no edema.  Lymphadenopathy:    She has no cervical adenopathy.  Neurological: She is alert and oriented to person, place, and time. She displays normal reflexes. No cranial nerve deficit.  Skin: No rash noted.  Psychiatric: She has a normal mood and affect. Her behavior is normal. Judgment and thought content normal.          Assessment & Plan:   Medicare wellness exam. Previously received Prevnar 13. Needs Pneumovax. Continue yearly flu vaccine. She will continue with yearly mammogram. Confirm date of last colonoscopy

## 2015-01-04 NOTE — Patient Instructions (Signed)
Continue with yearly flu vaccine and mammogram. Confirm date of last colonoscopy. You will need tetanus booster in 3 years. Recommend calcium 1200 mg daily and Vit D 800 to 1000 IU daily

## 2015-01-04 NOTE — Progress Notes (Signed)
Pre visit review using our clinic review tool, if applicable. No additional management support is needed unless otherwise documented below in the visit note. 

## 2015-01-04 NOTE — Telephone Encounter (Signed)
We do not recommend B12 injections unless people have proven low B12 levels.  OTC supplement would be OK to try first.

## 2015-01-04 NOTE — Telephone Encounter (Signed)
[name] was just wondering what Dr. [name] things about the B12 injection? She would be interested in getting the injection. She said that her friend has been getting the injections and says that it helps her with her fatigue.

## 2015-01-04 NOTE — Telephone Encounter (Signed)
Patient informed. 

## 2015-01-17 DIAGNOSIS — Z85820 Personal history of malignant melanoma of skin: Secondary | ICD-10-CM | POA: Diagnosis not present

## 2015-01-28 ENCOUNTER — Ambulatory Visit (HOSPITAL_COMMUNITY)
Admission: RE | Admit: 2015-01-28 | Discharge: 2015-01-28 | Disposition: A | Discharge status: Home / Self Care | Payer: Medicare Other | Source: Ambulatory Visit | Attending: Women's Health | Admitting: Women's Health

## 2015-01-28 DIAGNOSIS — Z1231 Encounter for screening mammogram for malignant neoplasm of breast: Secondary | ICD-10-CM | POA: Diagnosis not present

## 2015-02-18 DIAGNOSIS — R21 Rash and other nonspecific skin eruption: Secondary | ICD-10-CM | POA: Diagnosis not present

## 2015-03-07 DIAGNOSIS — L821 Other seborrheic keratosis: Secondary | ICD-10-CM | POA: Diagnosis not present

## 2015-03-07 DIAGNOSIS — L570 Actinic keratosis: Secondary | ICD-10-CM | POA: Diagnosis not present

## 2015-03-07 DIAGNOSIS — D225 Melanocytic nevi of trunk: Secondary | ICD-10-CM | POA: Diagnosis not present

## 2015-03-07 DIAGNOSIS — Z08 Encounter for follow-up examination after completed treatment for malignant neoplasm: Secondary | ICD-10-CM | POA: Diagnosis not present

## 2015-03-07 DIAGNOSIS — Z85820 Personal history of malignant melanoma of skin: Secondary | ICD-10-CM | POA: Diagnosis not present

## 2015-03-12 ENCOUNTER — Other Ambulatory Visit: Payer: Self-pay | Admitting: Cardiology

## 2015-03-12 DIAGNOSIS — M71061 Abscess of bursa, right knee: Secondary | ICD-10-CM | POA: Diagnosis not present

## 2015-03-12 DIAGNOSIS — Z4789 Encounter for other orthopedic aftercare: Secondary | ICD-10-CM | POA: Diagnosis not present

## 2015-03-12 DIAGNOSIS — M2241 Chondromalacia patellae, right knee: Secondary | ICD-10-CM | POA: Diagnosis not present

## 2015-03-23 DIAGNOSIS — I739 Peripheral vascular disease, unspecified: Secondary | ICD-10-CM

## 2015-03-23 HISTORY — DX: Peripheral vascular disease, unspecified: I73.9

## 2015-04-05 DIAGNOSIS — M7541 Impingement syndrome of right shoulder: Secondary | ICD-10-CM | POA: Diagnosis not present

## 2015-04-05 DIAGNOSIS — M9902 Segmental and somatic dysfunction of thoracic region: Secondary | ICD-10-CM | POA: Diagnosis not present

## 2015-04-05 DIAGNOSIS — M9907 Segmental and somatic dysfunction of upper extremity: Secondary | ICD-10-CM | POA: Diagnosis not present

## 2015-04-05 DIAGNOSIS — M791 Myalgia: Secondary | ICD-10-CM | POA: Diagnosis not present

## 2015-04-05 DIAGNOSIS — M9903 Segmental and somatic dysfunction of lumbar region: Secondary | ICD-10-CM | POA: Diagnosis not present

## 2015-04-05 DIAGNOSIS — M9901 Segmental and somatic dysfunction of cervical region: Secondary | ICD-10-CM | POA: Diagnosis not present

## 2015-04-08 ENCOUNTER — Encounter (HOSPITAL_COMMUNITY): Payer: Self-pay | Admitting: *Deleted

## 2015-04-08 ENCOUNTER — Emergency Department (HOSPITAL_COMMUNITY): Payer: Medicare Other

## 2015-04-08 ENCOUNTER — Telehealth: Payer: Self-pay | Admitting: Cardiology

## 2015-04-08 ENCOUNTER — Emergency Department (HOSPITAL_COMMUNITY)
Admission: EM | Admit: 2015-04-08 | Discharge: 2015-04-08 | Disposition: A | Discharge status: Home / Self Care | Payer: Medicare Other | Attending: Emergency Medicine | Admitting: Emergency Medicine

## 2015-04-08 ENCOUNTER — Other Ambulatory Visit: Payer: Self-pay | Admitting: Family Medicine

## 2015-04-08 DIAGNOSIS — Z79899 Other long term (current) drug therapy: Secondary | ICD-10-CM | POA: Insufficient documentation

## 2015-04-08 DIAGNOSIS — Y9389 Activity, other specified: Secondary | ICD-10-CM | POA: Diagnosis not present

## 2015-04-08 DIAGNOSIS — Z91040 Latex allergy status: Secondary | ICD-10-CM | POA: Insufficient documentation

## 2015-04-08 DIAGNOSIS — M79661 Pain in right lower leg: Secondary | ICD-10-CM | POA: Diagnosis not present

## 2015-04-08 DIAGNOSIS — R011 Cardiac murmur, unspecified: Secondary | ICD-10-CM | POA: Insufficient documentation

## 2015-04-08 DIAGNOSIS — Y998 Other external cause status: Secondary | ICD-10-CM | POA: Diagnosis not present

## 2015-04-08 DIAGNOSIS — Y9289 Other specified places as the place of occurrence of the external cause: Secondary | ICD-10-CM | POA: Diagnosis not present

## 2015-04-08 DIAGNOSIS — G4700 Insomnia, unspecified: Secondary | ICD-10-CM | POA: Insufficient documentation

## 2015-04-08 DIAGNOSIS — Z8739 Personal history of other diseases of the musculoskeletal system and connective tissue: Secondary | ICD-10-CM | POA: Diagnosis not present

## 2015-04-08 DIAGNOSIS — I4891 Unspecified atrial fibrillation: Secondary | ICD-10-CM | POA: Insufficient documentation

## 2015-04-08 DIAGNOSIS — W010XXA Fall on same level from slipping, tripping and stumbling without subsequent striking against object, initial encounter: Secondary | ICD-10-CM | POA: Insufficient documentation

## 2015-04-08 DIAGNOSIS — Z85828 Personal history of other malignant neoplasm of skin: Secondary | ICD-10-CM | POA: Diagnosis not present

## 2015-04-08 DIAGNOSIS — Z87891 Personal history of nicotine dependence: Secondary | ICD-10-CM | POA: Diagnosis not present

## 2015-04-08 DIAGNOSIS — S8011XA Contusion of right lower leg, initial encounter: Secondary | ICD-10-CM | POA: Diagnosis not present

## 2015-04-08 DIAGNOSIS — M7981 Nontraumatic hematoma of soft tissue: Secondary | ICD-10-CM | POA: Diagnosis not present

## 2015-04-08 DIAGNOSIS — S8991XA Unspecified injury of right lower leg, initial encounter: Secondary | ICD-10-CM | POA: Diagnosis present

## 2015-04-08 DIAGNOSIS — M7989 Other specified soft tissue disorders: Secondary | ICD-10-CM | POA: Diagnosis not present

## 2015-04-08 LAB — CBC WITH DIFFERENTIAL/PLATELET
BASOS ABS: 0.1 10*3/uL (ref 0.0–0.1)
BASOS PCT: 2 %
Eosinophils Absolute: 0.2 10*3/uL (ref 0.0–0.7)
Eosinophils Relative: 5 %
HEMATOCRIT: 36.3 % (ref 36.0–46.0)
Hemoglobin: 12.7 g/dL (ref 12.0–15.0)
Lymphocytes Relative: 40 %
Lymphs Abs: 1.6 10*3/uL (ref 0.7–4.0)
MCH: 34.6 pg — ABNORMAL HIGH (ref 26.0–34.0)
MCHC: 35 g/dL (ref 30.0–36.0)
MCV: 98.9 fL (ref 78.0–100.0)
Monocytes Absolute: 0.5 10*3/uL (ref 0.1–1.0)
Monocytes Relative: 11 %
NEUTROS PCT: 42 %
Neutro Abs: 1.7 10*3/uL (ref 1.7–7.7)
Platelets: 240 10*3/uL (ref 150–400)
RBC: 3.67 MIL/uL — AB (ref 3.87–5.11)
RDW: 12.8 % (ref 11.5–15.5)
WBC: 4 10*3/uL (ref 4.0–10.5)

## 2015-04-08 LAB — BASIC METABOLIC PANEL
Anion gap: 10 (ref 5–15)
BUN: 17 mg/dL (ref 6–20)
CALCIUM: 9.6 mg/dL (ref 8.9–10.3)
CO2: 20 mmol/L — ABNORMAL LOW (ref 22–32)
Chloride: 111 mmol/L (ref 101–111)
Creatinine, Ser: 0.8 mg/dL (ref 0.44–1.00)
Glucose, Bld: 91 mg/dL (ref 65–99)
Potassium: 3.5 mmol/L (ref 3.5–5.1)
Sodium: 141 mmol/L (ref 135–145)

## 2015-04-08 MED ORDER — HYDROCODONE-ACETAMINOPHEN 5-325 MG PO TABS: 1.0000 | ORAL_TABLET | ORAL | Status: DC | PRN

## 2015-04-08 NOTE — Telephone Encounter (Signed)
F/u      Pt returning Anita's phone call.

## 2015-04-08 NOTE — Telephone Encounter (Signed)
Pt c/o medication issue: 1. Name of Medication: Metoprolol  2. How are you currently taking this medication (dosage and times per day)? 25mg  once a day  3. Are you having a reaction (difficulty breathing--STAT  Rash  4. What is your medication issue? Pt has dry lips and rash around lips and think it might be coming from this medication. Please call pt.

## 2015-04-08 NOTE — Telephone Encounter (Signed)
Spoke with patient and advised that our pharmacist, Elberta Leatherwood, advised Toprol is not likely the cause of the rash and advised she call her PCP.  Patient states it was her dentist who advised that some cardiac medications can cause mouth problems.  I advised her to call Dr. [name] for advice since he is the one that prescribed the medication.  She verbalized understanding and agreement.

## 2015-04-08 NOTE — ED Notes (Signed)
Bed: ZC58 Expected date:  Expected time:  Means of arrival:  Comments: [place]

## 2015-04-08 NOTE — ED Provider Notes (Signed)
CSN: [id number]     Arrival date & time 04/08/15  1807 History   First MD Initiated Contact with Patient 04/08/15 1839     Chief Complaint  Patient presents with  . Fall  . Leg Swelling     (Consider location/radiation/quality/duration/timing/severity/associated sxs/prior Treatment) HPI  69 year old female presents with progressive anterior lower leg swelling. Tripped and fell at around 11 AM today and injured her shin. Went to urgent care and was diagnosed with a hematoma with a negative x-ray. Since then the swelling has progressed from mid shin up into her proximal lower leg. Has become tight and pain has increased. A type of movement including moving her toes are trying to walk causes severe pain. At rest her pain is about a 7/10. Denies any numbness at this time. No change in the color of her skin. Is on a eliquis for intermittent age of fibrillation from mitral valve prolapse. Declines pain meds at this time.  Past Medical History  Diagnosis Date  . INSOMNIA, CHRONIC 03/29/2009  . Skin cancer of arm     Squamous  . MVP (mitral valve prolapse)     NO MEDS  . Osteopenia 01/2014    T score -1.8 FRAX 8.8%/1.3%  . Heart murmur     mild mvr  . PONV (postoperative nausea and vomiting)   . Prepatellar bursitis     RT KNEE  . History of melanoma excision   . History of palpitations    Past Surgical History  Procedure Laterality Date  . Appendectomy  1969  . Tonsillectomy  1980  . Abdominal hysterectomy  1980    WITH BSO/FIBROIDS  . Oophorectomy      BSO WITH ABDOMINAL HYSTERECTOMY  . Thyroid cyst excision  05-01-11    RIGHT  . Shoulder arthroscopy  2010    right  . Colonoscopy    . Diagnostic laparoscopy      expl  . Excision melanoma with sentinel lymph node biopsy Right 10/02/2013    Procedure: WIDE EXCISION MELANOMA RIGHT ARM WITH SENTINEL LYMPH NODE BIOPSY;  Surgeon: Shann Medal, MD;  Location: [facility];  Service: General;  Laterality: Right;  . Knee  bursectomy Right 03/05/2014    Procedure: RIGHT KNEE BURSECTOMY;  Surgeon: Gearlean Alf, MD;  Location: [facility];  Service: Orthopedics;  Laterality: Right;   Family History  Problem Relation Age of Onset  . Diabetes type II Mother   . Alcohol abuse Father   . Throat cancer Father   . Ovarian cancer Maternal Aunt   . Breast cancer Maternal Grandmother    Social History  Substance Use Topics  . Smoking status: Former Smoker -- 1.00 packs/day for 20 years    Types: Cigarettes    Quit date: 08/11/1970  . Smokeless tobacco: Never Used  . Alcohol Use: 4.2 oz/week    7 Glasses of wine per week   OB History    Gravida Para Term Preterm AB TAB SAB Ectopic Multiple Living   3 3        3      Review of Systems  Cardiovascular: Positive for leg swelling.  Musculoskeletal: Positive for arthralgias.  Skin: Negative for wound.  Neurological: Negative for weakness and numbness.  All other systems reviewed and are negative.     Allergies  Inderal; Tape; and Latex  Home Medications   Prior to Admission medications   Medication Sig Start Date End Date Taking? Authorizing Provider  acetaminophen (TYLENOL) 500 MG  tablet Take 500 mg by mouth every 6 (six) hours as needed for mild pain or headache.   Yes Historical Provider, MD  ALPRAZolam Duanne Moron) 0.5 MG tablet Take one tablet every 8 hours as needed severe anxiety. Take one hour prior to flying. 10/09/14  Yes [name], MD  ELIQUIS 5 MG TABS tablet TAKE 1 TABLET(5 MG) BY MOUTH TWICE DAILY 03/12/15  Yes Larey Dresser, MD  metoprolol succinate (TOPROL-XL) 25 MG 24 hr tablet Take 1 tablet (25 mg total) by mouth daily. 10/18/14  Yes Larey Dresser, MD  nystatin (MYCOSTATIN/NYSTOP) 100000 UNIT/GM POWD Apply 1 application topically daily as needed. 03/07/15  Yes Historical Provider, MD  zolpidem (AMBIEN) 10 MG tablet Take 1 tablet (10 mg total) by mouth at bedtime as needed for sleep. 10/31/14  Yes [name], MD   BP 149/66 mmHg   Pulse 72  Temp(Src) 98.4 F (36.9 C) (Oral)  Resp 19  SpO2 100%  LMP 06/22/1977 Physical Exam  Constitutional: She is oriented to person, place, and time. She appears well-developed and well-nourished.  HENT:  Head: Normocephalic and atraumatic.  Right Ear: External ear normal.  Left Ear: External ear normal.  Nose: Nose normal.  Eyes: Right eye exhibits no discharge. Left eye exhibits no discharge.  Cardiovascular: Intact distal pulses.   Pulses:      Dorsalis pedis pulses are 2+ on the right side, and 2+ on the left side.  Pulmonary/Chest: Effort normal and breath sounds normal.  Abdominal: She exhibits no distension.  Musculoskeletal:       Right knee: She exhibits no swelling.       Right ankle: She exhibits normal range of motion and no swelling. No tenderness.       Right lower leg: She exhibits tenderness and swelling. She exhibits no deformity.       Legs: Normal DP pulses, warmth, gross sensation and movement of RLE compared to LLE  Neurological: She is alert and oriented to person, place, and time.  Skin: Skin is warm and dry.  Nursing note and vitals reviewed.   ED Course  Procedures (including critical care time) Labs Review Labs Reviewed  CBC WITH DIFFERENTIAL/PLATELET - Abnormal; Notable for the following:    RBC 3.67 (*)    MCH 34.6 (*)    All other components within normal limits  BASIC METABOLIC PANEL    Imaging Review Dg Tibia/fibula Right  04/08/2015  CLINICAL DATA:  Pain and swelling after a fall at home in her kitchen today. EXAM: RIGHT TIBIA AND FIBULA - 2 VIEW COMPARISON:  None. FINDINGS: The tibia and fibula are normal. No joint effusions. No dislocation. Prominent soft tissue swelling anterior to the proximal tibia. IMPRESSION: Soft tissue swelling anterior to the proximal tibia. Otherwise, normal exam. Electronically Signed   By: Lorriane Shire M.D.   On: 04/08/2015 19:04   I have personally reviewed and evaluated these images and lab results as  part of my medical decision-making.   EKG Interpretation None      MDM   Final diagnoses:  Contusion of right leg, initial encounter    Patient has firmness to anterior compartment but no severe pain, neuro dysfunction or pulse deficit. Dr. Lorin Mercy has seen patient and feels this is not compartment syndrome and recommends elevation, ice, cam walker and crutches. Will give pain medicine for home although she declines pain meds at this time. Discussed strict return precautions and given compartment syndrome precautions.    Sherwood Gambler,  MD 04/08/15 2334

## 2015-04-08 NOTE — ED Notes (Signed)
Patient transported to X-ray 

## 2015-04-08 NOTE — Telephone Encounter (Signed)
Lm w/her husband for her to call back

## 2015-04-08 NOTE — Discharge Instructions (Signed)
If your swelling worsens on your leg or your pain increases, return to the ER immediately. If you notice numbness, weakness or change in color of your leg, come back immediately or call Dr. Lorin Mercy. Otherwise stay off that foot and use the boot as often as possible throughout the day with the crutches. Elevate and Ice your leg.

## 2015-04-08 NOTE — Consult Note (Signed)
Reason for Consult:leg hematoma after fall ? Compartment syndrome Referring Physician: Regenia Skeeter MD  [name] is an 69 y.o. female.  HPI: pt fell over dustpan with prox tibia contact, on  Eliquis with increased hematoma since fall . Pain with ROM of ankle.   Past Medical History  Diagnosis Date  . INSOMNIA, CHRONIC 03/29/2009  . Skin cancer of arm     Squamous  . MVP (mitral valve prolapse)     NO MEDS  . Osteopenia 01/2014    T score -1.8 FRAX 8.8%/1.3%  . Heart murmur     mild mvr  . PONV (postoperative nausea and vomiting)   . Prepatellar bursitis     RT KNEE  . History of melanoma excision   . History of palpitations     Past Surgical History  Procedure Laterality Date  . Appendectomy  1969  . Tonsillectomy  1980  . Abdominal hysterectomy  1980    WITH BSO/FIBROIDS  . Oophorectomy      BSO WITH ABDOMINAL HYSTERECTOMY  . Thyroid cyst excision  05-01-11    RIGHT  . Shoulder arthroscopy  2010    right  . Colonoscopy    . Diagnostic laparoscopy      expl  . Excision melanoma with sentinel lymph node biopsy Right 10/02/2013    Procedure: WIDE EXCISION MELANOMA RIGHT ARM WITH SENTINEL LYMPH NODE BIOPSY;  Surgeon: Shann Medal, MD;  Location: [facility];  Service: General;  Laterality: Right;  . Knee bursectomy Right 03/05/2014    Procedure: RIGHT KNEE BURSECTOMY;  Surgeon: Gearlean Alf, MD;  Location: [facility];  Service: Orthopedics;  Laterality: Right;    Family History  Problem Relation Age of Onset  . Diabetes type II Mother   . Alcohol abuse Father   . Throat cancer Father   . Ovarian cancer Maternal Aunt   . Breast cancer Maternal Grandmother     Social History:  reports that she quit smoking about 44 years ago. Her smoking use included Cigarettes. She has a 20 pack-year smoking history. She has never used smokeless tobacco. She reports that she drinks about 4.2 oz of alcohol per week. She reports that she does not use illicit  drugs.  Allergies:  Allergies  Allergen Reactions  . Inderal [Propranolol Hcl]     SEVERE LIP AND SKIN DRYNESS  . Tape     Very sensitive skin-  . Latex Itching    Sensitive to latex    Medications: I have reviewed the patient's current medications.  Results for orders placed or performed during the hospital encounter of 04/08/15 (from the past 48 hour(s))  Basic metabolic panel     Status: Abnormal   Collection Time: 04/08/15  7:21 PM  Result Value Ref Range   Sodium 141 135 - 145 mmol/L   Potassium 3.5 3.5 - 5.1 mmol/L   Chloride 111 101 - 111 mmol/L   CO2 20 (L) 22 - 32 mmol/L   Glucose, Bld 91 65 - 99 mg/dL   BUN 17 6 - 20 mg/dL   Creatinine, Ser 0.80 0.44 - 1.00 mg/dL   Calcium 9.6 8.9 - 10.3 mg/dL   GFR calc non Af Amer >60 >60 mL/min   GFR calc Af Amer >60 >60 mL/min    Comment: (NOTE) The eGFR has been calculated using the CKD EPI equation. This calculation has not been validated in all clinical situations. eGFR's persistently <60 mL/min signify possible Chronic Kidney Disease.  Anion gap 10 5 - 15  CBC with Differential     Status: Abnormal   Collection Time: 04/08/15  7:21 PM  Result Value Ref Range   WBC 4.0 4.0 - 10.5 K/uL   RBC 3.67 (L) 3.87 - 5.11 MIL/uL   Hemoglobin 12.7 12.0 - 15.0 g/dL   HCT 36.3 36.0 - 46.0 %   MCV 98.9 78.0 - 100.0 fL   MCH 34.6 (H) 26.0 - 34.0 pg   MCHC 35.0 30.0 - 36.0 g/dL   RDW 12.8 11.5 - 15.5 %   Platelets 240 150 - 400 K/uL   Neutrophils Relative % 42 %   Neutro Abs 1.7 1.7 - 7.7 K/uL   Lymphocytes Relative 40 %   Lymphs Abs 1.6 0.7 - 4.0 K/uL   Monocytes Relative 11 %   Monocytes Absolute 0.5 0.1 - 1.0 K/uL   Eosinophils Relative 5 %   Eosinophils Absolute 0.2 0.0 - 0.7 K/uL   Basophils Relative 2 %   Basophils Absolute 0.1 0.0 - 0.1 K/uL    Dg Tibia/fibula Right  04/08/2015  CLINICAL DATA:  Pain and swelling after a fall at home in her kitchen today. EXAM: RIGHT TIBIA AND FIBULA - 2 VIEW COMPARISON:  None.  FINDINGS: The tibia and fibula are normal. No joint effusions. No dislocation. Prominent soft tissue swelling anterior to the proximal tibia. IMPRESSION: Soft tissue swelling anterior to the proximal tibia. Otherwise, normal exam. Electronically Signed   By: Lorriane Shire M.D.   On: 04/08/2015 19:04    Review of Systems  Constitutional: Negative for fever and weight loss.  Eyes: Negative for blurred vision.  Respiratory: Negative for cough and hemoptysis.   Cardiovascular: Negative for orthopnea.       Intermittant A fib  Genitourinary: Negative for dysuria.  Skin: Negative for rash.  Neurological: Positive for dizziness.  Endo/Heme/Allergies: Bruises/bleeds easily.       On Eliquis   Blood pressure 149/66, pulse 72, temperature 98.4 F (36.9 C), temperature source Oral, resp. rate 19, last menstrual period 06/22/1977, SpO2 100 %. Physical Exam  Constitutional: She is oriented to person, place, and time. She appears well-developed and well-nourished.  HENT:  Head: Normocephalic.  Eyes: Pupils are equal, round, and reactive to light.  Neck: Normal range of motion.  Respiratory: Effort normal.  GI: Soft.  Musculoskeletal:  [place] on a dust pan with contusion over PES bursa initially and then with more swelling. Pain with motion. Sub Q hematoma , anterior and lateral compartment soft. Pulses normal .   Neurological: She is alert and oriented to person, place, and time.  Skin: Skin is warm and dry. No rash noted. No erythema.  Psychiatric: She has a normal mood and affect. Her behavior is normal. Judgment and thought content normal.    Assessment/Plan: Leg contusion , on eliquis with increased sub Q hematoma.   Plan Elevation , CAM Boot, ice on and off, no walking except to Bathroom. followup 48 hrs my office  Norco for pain. No compartment syndrome at this time. Distal portion of compartment nontender and soft.   Nollie Terlizzi C 04/08/2015, 8:05 PM

## 2015-04-08 NOTE — ED Notes (Signed)
Pt is on blood thinner. Pt tripped and fell onto a dusting rod at 1100 today, pain and injury to right shin, below knee, pt went to urgent care was told she had a hematoma, at that time pt had swelling "goose egg" to top of shin right below knee. Left urgent care around 1245, since then swelling has moved and now upper calf is extremely swollen. Pain 7/10. Denies numbness or tingling. Able to wiggle toes.

## 2015-04-08 NOTE — ED Notes (Signed)
UNABLE TO COLLECT LABS AT THIS TIME PATIENT IS NOT IN ROOM.

## 2015-04-09 NOTE — Telephone Encounter (Signed)
Refill OK

## 2015-04-09 NOTE — Telephone Encounter (Signed)
Patient requesting refill on Zolpidem (Ambien) 10 mg tablet  Last seen: 01/04/2015 Last refill: 10/31/2014 # 90 with zero refills

## 2015-04-10 DIAGNOSIS — S8011XD Contusion of right lower leg, subsequent encounter: Secondary | ICD-10-CM | POA: Diagnosis not present

## 2015-04-10 DIAGNOSIS — M79661 Pain in right lower leg: Secondary | ICD-10-CM | POA: Diagnosis not present

## 2015-04-16 DIAGNOSIS — M79661 Pain in right lower leg: Secondary | ICD-10-CM | POA: Diagnosis not present

## 2015-04-16 DIAGNOSIS — S8011XD Contusion of right lower leg, subsequent encounter: Secondary | ICD-10-CM | POA: Diagnosis not present

## 2015-04-23 DIAGNOSIS — M79661 Pain in right lower leg: Secondary | ICD-10-CM | POA: Diagnosis not present

## 2015-04-23 DIAGNOSIS — S8011XD Contusion of right lower leg, subsequent encounter: Secondary | ICD-10-CM | POA: Diagnosis not present

## 2015-05-08 ENCOUNTER — Telehealth: Payer: Self-pay | Admitting: *Deleted

## 2015-05-08 NOTE — Telephone Encounter (Signed)
Patient at office with husband and requesting refill. States going out of town next week and would like refill before she goes. Please advise.   Medication: Zolpidem (Ambien) 10 mg  Last refilled: 10/31/14 #90 with no refills  Last seen: 01/04/15

## 2015-05-09 ENCOUNTER — Other Ambulatory Visit: Payer: Self-pay | Admitting: *Deleted

## 2015-05-09 NOTE — Telephone Encounter (Signed)
Refill OK

## 2015-05-09 NOTE — Telephone Encounter (Signed)
Rx called. Patient is aware if we did not call her back Rx would be filled.

## 2015-05-09 NOTE — Telephone Encounter (Signed)
error 

## 2015-05-10 DIAGNOSIS — S8011XD Contusion of right lower leg, subsequent encounter: Secondary | ICD-10-CM | POA: Diagnosis not present

## 2015-05-10 DIAGNOSIS — M79661 Pain in right lower leg: Secondary | ICD-10-CM | POA: Diagnosis not present

## 2015-06-07 DIAGNOSIS — M79661 Pain in right lower leg: Secondary | ICD-10-CM | POA: Diagnosis not present

## 2015-06-07 DIAGNOSIS — S8011XD Contusion of right lower leg, subsequent encounter: Secondary | ICD-10-CM | POA: Diagnosis not present

## 2015-06-18 ENCOUNTER — Other Ambulatory Visit: Payer: Self-pay | Admitting: Cardiology

## 2015-06-19 DIAGNOSIS — M9902 Segmental and somatic dysfunction of thoracic region: Secondary | ICD-10-CM | POA: Diagnosis not present

## 2015-06-19 DIAGNOSIS — M791 Myalgia: Secondary | ICD-10-CM | POA: Diagnosis not present

## 2015-06-19 DIAGNOSIS — M7541 Impingement syndrome of right shoulder: Secondary | ICD-10-CM | POA: Diagnosis not present

## 2015-06-19 DIAGNOSIS — M9907 Segmental and somatic dysfunction of upper extremity: Secondary | ICD-10-CM | POA: Diagnosis not present

## 2015-06-19 DIAGNOSIS — M9901 Segmental and somatic dysfunction of cervical region: Secondary | ICD-10-CM | POA: Diagnosis not present

## 2015-06-19 DIAGNOSIS — M9903 Segmental and somatic dysfunction of lumbar region: Secondary | ICD-10-CM | POA: Diagnosis not present

## 2015-06-26 DIAGNOSIS — M7541 Impingement syndrome of right shoulder: Secondary | ICD-10-CM | POA: Diagnosis not present

## 2015-06-26 DIAGNOSIS — M9901 Segmental and somatic dysfunction of cervical region: Secondary | ICD-10-CM | POA: Diagnosis not present

## 2015-06-26 DIAGNOSIS — M9907 Segmental and somatic dysfunction of upper extremity: Secondary | ICD-10-CM | POA: Diagnosis not present

## 2015-06-26 DIAGNOSIS — M9903 Segmental and somatic dysfunction of lumbar region: Secondary | ICD-10-CM | POA: Diagnosis not present

## 2015-06-26 DIAGNOSIS — M791 Myalgia: Secondary | ICD-10-CM | POA: Diagnosis not present

## 2015-06-26 DIAGNOSIS — M9902 Segmental and somatic dysfunction of thoracic region: Secondary | ICD-10-CM | POA: Diagnosis not present

## 2015-06-28 DIAGNOSIS — M7541 Impingement syndrome of right shoulder: Secondary | ICD-10-CM | POA: Diagnosis not present

## 2015-06-28 DIAGNOSIS — M9901 Segmental and somatic dysfunction of cervical region: Secondary | ICD-10-CM | POA: Diagnosis not present

## 2015-06-28 DIAGNOSIS — M9907 Segmental and somatic dysfunction of upper extremity: Secondary | ICD-10-CM | POA: Diagnosis not present

## 2015-06-28 DIAGNOSIS — M791 Myalgia: Secondary | ICD-10-CM | POA: Diagnosis not present

## 2015-06-28 DIAGNOSIS — M9902 Segmental and somatic dysfunction of thoracic region: Secondary | ICD-10-CM | POA: Diagnosis not present

## 2015-06-28 DIAGNOSIS — M9903 Segmental and somatic dysfunction of lumbar region: Secondary | ICD-10-CM | POA: Diagnosis not present

## 2015-07-02 ENCOUNTER — Telehealth: Payer: Self-pay | Admitting: Cardiology

## 2015-07-02 DIAGNOSIS — M9907 Segmental and somatic dysfunction of upper extremity: Secondary | ICD-10-CM | POA: Diagnosis not present

## 2015-07-02 DIAGNOSIS — M9901 Segmental and somatic dysfunction of cervical region: Secondary | ICD-10-CM | POA: Diagnosis not present

## 2015-07-02 DIAGNOSIS — M7541 Impingement syndrome of right shoulder: Secondary | ICD-10-CM | POA: Diagnosis not present

## 2015-07-02 DIAGNOSIS — M9903 Segmental and somatic dysfunction of lumbar region: Secondary | ICD-10-CM | POA: Diagnosis not present

## 2015-07-02 DIAGNOSIS — M791 Myalgia: Secondary | ICD-10-CM | POA: Diagnosis not present

## 2015-07-02 DIAGNOSIS — M9902 Segmental and somatic dysfunction of thoracic region: Secondary | ICD-10-CM | POA: Diagnosis not present

## 2015-07-02 NOTE — Telephone Encounter (Signed)
LMTCB

## 2015-07-02 NOTE — Telephone Encounter (Signed)
Pt notified of Dr Claris Gladden comments.

## 2015-07-02 NOTE — Telephone Encounter (Signed)
Think this is ok.

## 2015-07-02 NOTE — Telephone Encounter (Signed)
New message      Pt has been seening a physical therapist because she fell and developed a blood clot in her leg.  While on crutches for 4 weeks it threw her hip out.  The physical therapist suggested pt try cryotank therapy ( in ice for 3 minutes).  Since pt is on eliquis, she want to know if Dr Aundra Dubin says it is ok to do?

## 2015-07-02 NOTE — Telephone Encounter (Signed)
I will forward to Dr McLean for review 

## 2015-07-02 NOTE — Telephone Encounter (Signed)
Pt not at home, call back.  

## 2015-07-04 DIAGNOSIS — M791 Myalgia: Secondary | ICD-10-CM | POA: Diagnosis not present

## 2015-07-04 DIAGNOSIS — M9903 Segmental and somatic dysfunction of lumbar region: Secondary | ICD-10-CM | POA: Diagnosis not present

## 2015-07-04 DIAGNOSIS — M9902 Segmental and somatic dysfunction of thoracic region: Secondary | ICD-10-CM | POA: Diagnosis not present

## 2015-07-04 DIAGNOSIS — M7541 Impingement syndrome of right shoulder: Secondary | ICD-10-CM | POA: Diagnosis not present

## 2015-07-04 DIAGNOSIS — M9907 Segmental and somatic dysfunction of upper extremity: Secondary | ICD-10-CM | POA: Diagnosis not present

## 2015-07-04 DIAGNOSIS — M9901 Segmental and somatic dysfunction of cervical region: Secondary | ICD-10-CM | POA: Diagnosis not present

## 2015-07-09 DIAGNOSIS — M9901 Segmental and somatic dysfunction of cervical region: Secondary | ICD-10-CM | POA: Diagnosis not present

## 2015-07-09 DIAGNOSIS — M9907 Segmental and somatic dysfunction of upper extremity: Secondary | ICD-10-CM | POA: Diagnosis not present

## 2015-07-09 DIAGNOSIS — M791 Myalgia: Secondary | ICD-10-CM | POA: Diagnosis not present

## 2015-07-09 DIAGNOSIS — M9903 Segmental and somatic dysfunction of lumbar region: Secondary | ICD-10-CM | POA: Diagnosis not present

## 2015-07-09 DIAGNOSIS — M7541 Impingement syndrome of right shoulder: Secondary | ICD-10-CM | POA: Diagnosis not present

## 2015-07-09 DIAGNOSIS — M9902 Segmental and somatic dysfunction of thoracic region: Secondary | ICD-10-CM | POA: Diagnosis not present

## 2015-07-09 DIAGNOSIS — M79661 Pain in right lower leg: Secondary | ICD-10-CM | POA: Diagnosis not present

## 2015-07-11 DIAGNOSIS — M9903 Segmental and somatic dysfunction of lumbar region: Secondary | ICD-10-CM | POA: Diagnosis not present

## 2015-07-11 DIAGNOSIS — M9901 Segmental and somatic dysfunction of cervical region: Secondary | ICD-10-CM | POA: Diagnosis not present

## 2015-07-11 DIAGNOSIS — M9907 Segmental and somatic dysfunction of upper extremity: Secondary | ICD-10-CM | POA: Diagnosis not present

## 2015-07-11 DIAGNOSIS — M9902 Segmental and somatic dysfunction of thoracic region: Secondary | ICD-10-CM | POA: Diagnosis not present

## 2015-07-11 DIAGNOSIS — M791 Myalgia: Secondary | ICD-10-CM | POA: Diagnosis not present

## 2015-07-11 DIAGNOSIS — M7541 Impingement syndrome of right shoulder: Secondary | ICD-10-CM | POA: Diagnosis not present

## 2015-07-18 DIAGNOSIS — M9901 Segmental and somatic dysfunction of cervical region: Secondary | ICD-10-CM | POA: Diagnosis not present

## 2015-07-18 DIAGNOSIS — M9907 Segmental and somatic dysfunction of upper extremity: Secondary | ICD-10-CM | POA: Diagnosis not present

## 2015-07-18 DIAGNOSIS — M7541 Impingement syndrome of right shoulder: Secondary | ICD-10-CM | POA: Diagnosis not present

## 2015-07-18 DIAGNOSIS — M791 Myalgia: Secondary | ICD-10-CM | POA: Diagnosis not present

## 2015-07-18 DIAGNOSIS — M9902 Segmental and somatic dysfunction of thoracic region: Secondary | ICD-10-CM | POA: Diagnosis not present

## 2015-07-18 DIAGNOSIS — M9903 Segmental and somatic dysfunction of lumbar region: Secondary | ICD-10-CM | POA: Diagnosis not present

## 2015-07-23 ENCOUNTER — Ambulatory Visit (INDEPENDENT_AMBULATORY_CARE_PROVIDER_SITE_OTHER): Payer: Medicare Other | Admitting: Women's Health

## 2015-07-23 ENCOUNTER — Encounter: Payer: Self-pay | Admitting: Women's Health

## 2015-07-23 VITALS — BP 118/82 | Ht 66.0 in | Wt 146.0 lb

## 2015-07-23 DIAGNOSIS — M9907 Segmental and somatic dysfunction of upper extremity: Secondary | ICD-10-CM | POA: Diagnosis not present

## 2015-07-23 DIAGNOSIS — M8580 Other specified disorders of bone density and structure, unspecified site: Secondary | ICD-10-CM

## 2015-07-23 DIAGNOSIS — M899 Disorder of bone, unspecified: Secondary | ICD-10-CM

## 2015-07-23 DIAGNOSIS — M9902 Segmental and somatic dysfunction of thoracic region: Secondary | ICD-10-CM | POA: Diagnosis not present

## 2015-07-23 DIAGNOSIS — M791 Myalgia: Secondary | ICD-10-CM | POA: Diagnosis not present

## 2015-07-23 DIAGNOSIS — M7541 Impingement syndrome of right shoulder: Secondary | ICD-10-CM | POA: Diagnosis not present

## 2015-07-23 DIAGNOSIS — Z01419 Encounter for gynecological examination (general) (routine) without abnormal findings: Secondary | ICD-10-CM

## 2015-07-23 DIAGNOSIS — M9903 Segmental and somatic dysfunction of lumbar region: Secondary | ICD-10-CM | POA: Diagnosis not present

## 2015-07-23 DIAGNOSIS — M9901 Segmental and somatic dysfunction of cervical region: Secondary | ICD-10-CM | POA: Diagnosis not present

## 2015-07-23 NOTE — Patient Instructions (Signed)

## 2015-07-23 NOTE — Progress Notes (Signed)
[name] Sep 28, 1945 [id number]    History:    Presents for breast and pelvic exam. TAH with BSO on no HRT. Normal Pap and mammogram history. 2015 T score -1.8 FRAX 8.8%/1.3%.   History of MVP  A. fib on Eliquis, right lower leg hematoma from injury has follow-up scheduled. 2012 negative colonoscopy.   Past medical history, past surgical history, family history and social history were all reviewed and documented in the EPIC chart. Retired, plays tennis and exercises daily. 5 children between she and her husband.  ROS:  A ROS was performed and pertinent positives and negatives are included.  Exam:  Filed Vitals:   07/23/15 1422  BP: 118/82    General appearance:  Normal Thyroid:  Symmetrical, normal in size, without palpable masses or nodularity. Respiratory  Auscultation:  Clear without wheezing or rhonchi Cardiovascular  Auscultation:  Regular rate, without rubs, murmurs or gallops  Edema/varicosities:  Not grossly evident Abdominal  Soft,nontender, without masses, guarding or rebound.  Liver/spleen:  No organomegaly noted  Hernia:  None appreciated  Skin  Inspection:  Grossly normal   Breasts: Examined lying and sitting.     Right: Without masses, retractions, discharge or axillary adenopathy.     Left: Without masses, retractions, discharge or axillary adenopathy. Gentitourinary   Inguinal/mons:  Normal without inguinal adenopathy  External genitalia:  Normal  BUS/Urethra/Skene's glands:  Normal  Vagina:  Normal  Cervix:  And uterus absent Adnexa/parametria:     Rt: Without masses or tenderness.   Lt: Without masses or tenderness.  Anus and perineum: Normal  Digital rectal exam: Normal sphincter tone without palpated masses or tenderness  Assessment/Plan:  70 y.o. M WF G3 P3 for breast and pelvic exam with no complaints.  TAH with BSO on no HRT 01/2014 osteopenia without elevated FRAX Hypertension/MVP/A. fib-cardiologist manages labs and meds Insomnia-Ambien  primary care manages History of melanoma- annual dermatology exams  Plan: SBE's, continue annual 3-D screening mammogram, calcium rich diet, vitamin D 1000 daily encouraged. History normal vitamin D level. Home safety, fall prevention and importance of continuing regular daily exercise encouraged.   [place], 4:59 PM 07/23/2015

## 2015-07-25 DIAGNOSIS — L812 Freckles: Secondary | ICD-10-CM | POA: Diagnosis not present

## 2015-07-25 DIAGNOSIS — Z85820 Personal history of malignant melanoma of skin: Secondary | ICD-10-CM | POA: Diagnosis not present

## 2015-07-25 DIAGNOSIS — L570 Actinic keratosis: Secondary | ICD-10-CM | POA: Diagnosis not present

## 2015-07-25 DIAGNOSIS — L821 Other seborrheic keratosis: Secondary | ICD-10-CM | POA: Diagnosis not present

## 2015-07-25 DIAGNOSIS — D1801 Hemangioma of skin and subcutaneous tissue: Secondary | ICD-10-CM | POA: Diagnosis not present

## 2015-07-30 DIAGNOSIS — M9901 Segmental and somatic dysfunction of cervical region: Secondary | ICD-10-CM | POA: Diagnosis not present

## 2015-07-30 DIAGNOSIS — M9902 Segmental and somatic dysfunction of thoracic region: Secondary | ICD-10-CM | POA: Diagnosis not present

## 2015-07-30 DIAGNOSIS — M7541 Impingement syndrome of right shoulder: Secondary | ICD-10-CM | POA: Diagnosis not present

## 2015-07-30 DIAGNOSIS — M9907 Segmental and somatic dysfunction of upper extremity: Secondary | ICD-10-CM | POA: Diagnosis not present

## 2015-07-30 DIAGNOSIS — M9903 Segmental and somatic dysfunction of lumbar region: Secondary | ICD-10-CM | POA: Diagnosis not present

## 2015-07-30 DIAGNOSIS — M791 Myalgia: Secondary | ICD-10-CM | POA: Diagnosis not present

## 2015-08-04 ENCOUNTER — Other Ambulatory Visit: Payer: Self-pay | Admitting: Family Medicine

## 2015-08-05 NOTE — Telephone Encounter (Signed)
If she got #90 on 07-07-15, should not need refill at this time.  Do we know that she got this filled then?

## 2015-08-05 NOTE — Telephone Encounter (Signed)
Last refill 07-07-2015 #90. Please advise

## 2015-08-06 NOTE — Telephone Encounter (Signed)
Husband call and said pt only received 30 pills on 07/07/15   Ambien    [facility]

## 2015-08-06 NOTE — Telephone Encounter (Signed)
Picked up #30 07-08-2015 Not due yet

## 2015-08-06 NOTE — Telephone Encounter (Signed)
Left message to call back  

## 2015-08-07 DIAGNOSIS — M9902 Segmental and somatic dysfunction of thoracic region: Secondary | ICD-10-CM | POA: Diagnosis not present

## 2015-08-07 DIAGNOSIS — M9907 Segmental and somatic dysfunction of upper extremity: Secondary | ICD-10-CM | POA: Diagnosis not present

## 2015-08-07 DIAGNOSIS — M791 Myalgia: Secondary | ICD-10-CM | POA: Diagnosis not present

## 2015-08-07 DIAGNOSIS — M9901 Segmental and somatic dysfunction of cervical region: Secondary | ICD-10-CM | POA: Diagnosis not present

## 2015-08-07 DIAGNOSIS — M9903 Segmental and somatic dysfunction of lumbar region: Secondary | ICD-10-CM | POA: Diagnosis not present

## 2015-08-07 DIAGNOSIS — M7541 Impingement syndrome of right shoulder: Secondary | ICD-10-CM | POA: Diagnosis not present

## 2015-08-08 ENCOUNTER — Encounter: Payer: Self-pay | Admitting: Cardiology

## 2015-08-08 ENCOUNTER — Ambulatory Visit (INDEPENDENT_AMBULATORY_CARE_PROVIDER_SITE_OTHER): Payer: Medicare Other | Admitting: Cardiology

## 2015-08-08 VITALS — BP 132/74 | HR 62 | Ht 66.0 in | Wt 147.0 lb

## 2015-08-08 DIAGNOSIS — Z79899 Other long term (current) drug therapy: Secondary | ICD-10-CM

## 2015-08-08 DIAGNOSIS — I480 Paroxysmal atrial fibrillation: Secondary | ICD-10-CM

## 2015-08-08 DIAGNOSIS — I340 Nonrheumatic mitral (valve) insufficiency: Secondary | ICD-10-CM

## 2015-08-08 LAB — CBC WITH DIFFERENTIAL/PLATELET
BASOS PCT: 1 % (ref 0–1)
Basophils Absolute: 0.1 10*3/uL (ref 0.0–0.1)
Eosinophils Absolute: 0.3 10*3/uL (ref 0.0–0.7)
Eosinophils Relative: 5 % (ref 0–5)
HCT: 39 % (ref 36.0–46.0)
HEMOGLOBIN: 13 g/dL (ref 12.0–15.0)
Lymphocytes Relative: 36 % (ref 12–46)
Lymphs Abs: 1.9 10*3/uL (ref 0.7–4.0)
MCH: 33.1 pg (ref 26.0–34.0)
MCHC: 33.3 g/dL (ref 30.0–36.0)
MCV: 99.2 fL (ref 78.0–100.0)
MPV: 10.6 fL (ref 8.6–12.4)
Monocytes Absolute: 0.7 10*3/uL (ref 0.1–1.0)
Monocytes Relative: 13 % — ABNORMAL HIGH (ref 3–12)
NEUTROS ABS: 2.3 10*3/uL (ref 1.7–7.7)
NEUTROS PCT: 45 % (ref 43–77)
Platelets: 241 10*3/uL (ref 150–400)
RBC: 3.93 MIL/uL (ref 3.87–5.11)
RDW: 13.3 % (ref 11.5–15.5)
WBC: 5.2 10*3/uL (ref 4.0–10.5)

## 2015-08-08 NOTE — Patient Instructions (Addendum)
Medication Instructions:  Your physician recommends that you continue on your current medications as directed. Please refer to the Current Medication list given to you today.  Labwork: Today: CBCD, BMET  Testing/Procedures: Your physician has requested that you have an echocardiogram in April. Echocardiography is a painless test that uses sound waves to create images of your heart. It provides your doctor with information about the size and shape of your heart and how well your heart's chambers and valves are working. This procedure takes approximately one hour. There are no restrictions for this procedure.  Follow-Up: Your physician wants you to follow-up in: 6 months with Dr. Aundra Dubin. You will receive a reminder letter in the mail two months in advance. If you don't receive a letter, please call our office to schedule the follow-up appointment.  If you need a refill on your cardiac medications before your next appointment, please call your pharmacy.  Thank you for choosing [facility]!!

## 2015-08-09 NOTE — Progress Notes (Signed)
Patient ID: [name], female   DOB: 03/16/1946, 70 y.o.   MRN: [id number] PCP: Dr. [name]  70 yo with history of mitral valve prolapse and mitral regurgitation presents for followup.  Workup was done for mitral regurgitation initially in 2012.  She has bileaflet MV prolapse.  Mitral regurgitation was moderate to severe by TEE in 6/12.  ETT showed excellent exercise tolerance with no evidence for exercise limitation from MR.  We planned to continue monitoring her mitral regurgitation periodically.  Patient was then admitted in 10/12 with severe chest pain while working in her yard.  She had a left heart cath showing only mild luminal irregularities. Echo showed moderate mitral regurgitation.  3 week event monitor showed only PACs.    Patient developed sinus pressure/drainage concerning for sinusitis in 5/16.  During this illness, she noted her heart thumping/beating irregularly.  She developed dyspnea with vigorous walking or vacuuming.  No syncope, lightheadedness, or chest pain.  Symptoms persisted, so she saw Dr [name].  She was found to be atrial fibrillation with mild RVR.  She was started on Toprol XL and Eliquis.  She converted back to NSR and is in NSR today.  Last fall, she fell and developed a severe hematoma on her left calf.  This has resolved.  She feels fine currently.  She is very active and exercises daily with no exertional dyspnea (walking, tennis aerobics).  No chest pain, no lightheadedness. Rare brief fluttering.  Last echo in 4/16 showed moderate MR.  She wore a 30 day event monitor in 5/16 that showed a total of 6 minutes atrial fibrillation.    Labs (5/12): TSH normal, LDL 68, HDL 58, cardiac enzymes negative x 3 Labs (4/14): K 4.2, creatinine 0.8, TSH normal, BNP 38 Labs (4/15): LDL 100, HDL 93 Labs (9/15): 37.4 Labs (4/16): TSH normal Labs (5/16): K 4.1, creatinine 0.86, HCT 38.3 Labs (10/16): K 3.5, creatinine 0.8  ECG: NSR, normal  PMH:  1. Mitral valve prolapse:  Known since teenager.  Echo (5/12): EF 60%, normal LV size, grade II diastolic dysfunction, myxomatous mitral valve with bileaflet prolapse and moderate to severe mitral regurgitation, mild to moderate TR, PA systolic pressure 39 mmHg.  TEE (6/12) with EF 60%, moderate to severe MR with bileaflet prolapse and no systolic flow reversal in the pulmonary vein doppler signal, mild to moderate LAE.  ETT for exercise capacity (7/12): 12'19" on treadmill, no evidence of symptomatic limitation from mitral regurgitation. Echo (10/12): EF 60-65%, severe MVP with moderate MR.  Echo (4/14) with EF 60-65%, moderate MR with bileaflet MVP and normal RV.  Echo (4/16) with EF 55-60%, MVP with moderate mitral regurgitation, severe LAE.  2. Right shoulder surgery 2012.  3. Pulmonary nodule: followed by Dr. [name] 4. Thyroid nodule s/p biopsy: benign.  5. TAH-BSO due to endometriosis.  6. Appendectomy.  7. Chest pain: Lexiscan myoview (6/12):  EF 63%, no ischemia or infarction.  Left heart cath (10/12): Mild luminal irregularities only.  8. Atrial fibrillation: Paroxysmal, 1st noted in 4/16.  Event monitor 5/16 with 6 minutes of atrial fibrillation noted.   SH: Quit smoking at age 20.  Moved to [place] from [place] several years ago.  Married.  2 glasses wine/night.   FH: No CAD or valvular disease.   ROS: all systems reviewed and negative except as per HPI.   Current Outpatient Prescriptions  Medication Sig Dispense Refill  . acetaminophen (TYLENOL) 500 MG tablet Take 500 mg by mouth every 6 (six) hours  as needed for mild pain or headache.    . ALPRAZolam (XANAX) 0.5 MG tablet Take one tablet every 8 hours as needed severe anxiety. Take one hour prior to flying. 30 tablet 0  . ELIQUIS 5 MG TABS tablet TAKE 1 TABLET BY MOUTH TWICE DAILY 60 tablet 2  . metoprolol succinate (TOPROL-XL) 25 MG 24 hr tablet Take 1 tablet (25 mg total) by mouth daily. 90 tablet 3  . nystatin (MYCOSTATIN/NYSTOP) 100000 UNIT/GM POWD Apply 1  application topically daily as needed.  2  . zolpidem (AMBIEN) 10 MG tablet TAKE 1 TABLET BY MOUTH EVERY DAY AT BEDTIME AS NEEDED FOR SLEEP 30 tablet 0   No current facility-administered medications for this visit.    BP 132/74 mmHg  Pulse 62  Ht 5\' 6"  (1.676 m)  Wt 147 lb (66.679 kg)  BMI 23.74 kg/m2  LMP 06/22/1977 General: NAD Neck: No JVD, no thyromegaly or thyroid nodule.  Lungs: Clear to auscultation bilaterally with normal respiratory effort. CV: Nondisplaced PMI.  Heart regular S1/S2, no [id number], 3/6 late systolic murmur at apex.  No peripheral edema.  No carotid bruit.  Normal pedal pulses.  Abdomen: Soft, nontender, no hepatosplenomegaly, no distention.  Neurologic: Alert and oriented x 3.  Psych: Normal affect. Extremities: No clubbing or cyanosis.   Assessment/Plan: 1. Mitral regurgitation: Moderate MR with MVP on last echo in 4/16.  Development of paroxysmal atrial fibrillation is concerning for progression of MR. Repeat echo 4/17.  2. Atrial fibrillation: Paroxysmal.  NSR now with rare palpitations. She has severe LAE with moderate MR, so has substrate for atrial fibrillation.  CHADSVASC = 2 (gender, age).  30 day monitor showed rare atrial fibrillation.  - Continue Eliquis 5 mg bid (CBC/BMET today).  Continue Toprol XL.  - Atrial fibrillation does not occur frequently, so I do not think that there is a role for an antiarrhythmic.  - Keep ETOH to no more than 2 drinks a day.   - She does not screen positive for OSA.    Followup in 6 months.   [name] 08/09/2015

## 2015-08-12 ENCOUNTER — Telehealth: Payer: Self-pay

## 2015-08-12 LAB — BASIC METABOLIC PANEL

## 2015-08-12 NOTE — Telephone Encounter (Signed)
Prior auth for Eliquis 5 mg sent to [facility].

## 2015-08-13 ENCOUNTER — Telehealth: Payer: Self-pay

## 2015-08-13 NOTE — Telephone Encounter (Signed)
Eliquis approved through 06/21/2016. [place] [id number].

## 2015-08-15 ENCOUNTER — Other Ambulatory Visit: Payer: Self-pay | Admitting: *Deleted

## 2015-08-15 ENCOUNTER — Encounter: Payer: Self-pay | Admitting: Cardiology

## 2015-08-15 ENCOUNTER — Telehealth: Payer: Self-pay | Admitting: Cardiology

## 2015-08-15 DIAGNOSIS — Z79899 Other long term (current) drug therapy: Secondary | ICD-10-CM

## 2015-08-15 NOTE — Telephone Encounter (Signed)
New message ° ° ° ° °Returning a nurses call to get lab results °

## 2015-08-15 NOTE — Telephone Encounter (Signed)
Returning your call. °

## 2015-08-15 NOTE — Telephone Encounter (Signed)
This is not who called her

## 2015-08-15 NOTE — Telephone Encounter (Signed)
This encounter was created in error - please disregard.

## 2015-08-16 ENCOUNTER — Other Ambulatory Visit (INDEPENDENT_AMBULATORY_CARE_PROVIDER_SITE_OTHER): Payer: Medicare Other | Admitting: *Deleted

## 2015-08-16 DIAGNOSIS — Z79899 Other long term (current) drug therapy: Secondary | ICD-10-CM

## 2015-08-16 LAB — BASIC METABOLIC PANEL
BUN: 18 mg/dL (ref 7–25)
CHLORIDE: 105 mmol/L (ref 98–110)
CO2: 23 mmol/L (ref 20–31)
Calcium: 9 mg/dL (ref 8.6–10.4)
Creat: 0.76 mg/dL (ref 0.50–0.99)
Glucose, Bld: 81 mg/dL (ref 65–99)
POTASSIUM: 4.3 mmol/L (ref 3.5–5.3)
Sodium: 138 mmol/L (ref 135–146)

## 2015-08-16 NOTE — Addendum Note (Signed)
Addended by: Eulis Foster on: 08/16/2015 08:58 AM   Modules accepted: Orders

## 2015-08-23 ENCOUNTER — Telehealth: Payer: Self-pay | Admitting: Cardiology

## 2015-08-23 DIAGNOSIS — M76892 Other specified enthesopathies of left lower limb, excluding foot: Secondary | ICD-10-CM | POA: Diagnosis not present

## 2015-08-23 NOTE — Telephone Encounter (Signed)
New message ° ° ° ° ° °Returning a call to get lab results °

## 2015-08-23 NOTE — Telephone Encounter (Signed)
Normal results reviewed. No further questions.

## 2015-09-02 DIAGNOSIS — M76892 Other specified enthesopathies of left lower limb, excluding foot: Secondary | ICD-10-CM | POA: Diagnosis not present

## 2015-09-02 DIAGNOSIS — M76899 Other specified enthesopathies of unspecified lower limb, excluding foot: Secondary | ICD-10-CM | POA: Diagnosis not present

## 2015-09-03 ENCOUNTER — Other Ambulatory Visit: Payer: Self-pay | Admitting: Family Medicine

## 2015-09-09 DIAGNOSIS — M76892 Other specified enthesopathies of left lower limb, excluding foot: Secondary | ICD-10-CM | POA: Diagnosis not present

## 2015-09-16 DIAGNOSIS — M76892 Other specified enthesopathies of left lower limb, excluding foot: Secondary | ICD-10-CM | POA: Diagnosis not present

## 2015-09-23 DIAGNOSIS — M76892 Other specified enthesopathies of left lower limb, excluding foot: Secondary | ICD-10-CM | POA: Diagnosis not present

## 2015-09-24 DIAGNOSIS — M76892 Other specified enthesopathies of left lower limb, excluding foot: Secondary | ICD-10-CM | POA: Diagnosis not present

## 2015-09-24 DIAGNOSIS — M7062 Trochanteric bursitis, left hip: Secondary | ICD-10-CM | POA: Diagnosis not present

## 2015-10-01 DIAGNOSIS — L570 Actinic keratosis: Secondary | ICD-10-CM | POA: Diagnosis not present

## 2015-10-01 DIAGNOSIS — S80811A Abrasion, right lower leg, initial encounter: Secondary | ICD-10-CM | POA: Diagnosis not present

## 2015-10-02 ENCOUNTER — Other Ambulatory Visit: Payer: Self-pay | Admitting: Family Medicine

## 2015-10-02 NOTE — Telephone Encounter (Signed)
Due 10/06/2015

## 2015-10-04 ENCOUNTER — Ambulatory Visit (HOSPITAL_COMMUNITY): Payer: Medicare Other | Attending: Cardiovascular Disease

## 2015-10-04 ENCOUNTER — Other Ambulatory Visit: Payer: Self-pay

## 2015-10-04 DIAGNOSIS — I340 Nonrheumatic mitral (valve) insufficiency: Secondary | ICD-10-CM | POA: Diagnosis not present

## 2015-10-04 DIAGNOSIS — I517 Cardiomegaly: Secondary | ICD-10-CM | POA: Insufficient documentation

## 2015-10-04 DIAGNOSIS — I071 Rheumatic tricuspid insufficiency: Secondary | ICD-10-CM | POA: Insufficient documentation

## 2015-10-05 DIAGNOSIS — J3089 Other allergic rhinitis: Secondary | ICD-10-CM | POA: Diagnosis not present

## 2015-10-07 ENCOUNTER — Ambulatory Visit (INDEPENDENT_AMBULATORY_CARE_PROVIDER_SITE_OTHER): Payer: Medicare Other | Admitting: Family Medicine

## 2015-10-07 VITALS — BP 108/78 | HR 110 | Temp 97.9°F | Ht 66.0 in | Wt 140.1 lb

## 2015-10-07 DIAGNOSIS — R000 Tachycardia, unspecified: Secondary | ICD-10-CM | POA: Diagnosis not present

## 2015-10-07 DIAGNOSIS — I480 Paroxysmal atrial fibrillation: Secondary | ICD-10-CM

## 2015-10-07 DIAGNOSIS — B349 Viral infection, unspecified: Secondary | ICD-10-CM

## 2015-10-07 NOTE — Progress Notes (Signed)
Pre visit review using our clinic review tool, if applicable. No additional management support is needed unless otherwise documented below in the visit note. 

## 2015-10-07 NOTE — Patient Instructions (Signed)
Acute Bronchitis Bronchitis is inflammation of the airways that extend from the windpipe into the lungs (bronchi). The inflammation often causes mucus to develop. This leads to a cough, which is the most common symptom of bronchitis.  In acute bronchitis, the condition usually develops suddenly and goes away over time, usually in a couple weeks. Smoking, allergies, and asthma can make bronchitis worse. Repeated episodes of bronchitis may cause further lung problems.  CAUSES Acute bronchitis is most often caused by the same virus that causes a cold. The virus can spread from person to person (contagious) through coughing, sneezing, and touching contaminated objects. SIGNS AND SYMPTOMS   Cough.   Fever.   Coughing up mucus.   Body aches.   Chest congestion.   Chills.   Shortness of breath.   Sore throat.  DIAGNOSIS  Acute bronchitis is usually diagnosed through a physical exam. Your health care provider will also ask you questions about your medical history. Tests, such as chest X-rays, are sometimes done to rule out other conditions.  TREATMENT  Acute bronchitis usually goes away in a couple weeks. Oftentimes, no medical treatment is necessary. Medicines are sometimes given for relief of fever or cough. Antibiotic medicines are usually not needed but may be prescribed in certain situations. In some cases, an inhaler may be recommended to help reduce shortness of breath and control the cough. A cool mist vaporizer may also be used to help thin bronchial secretions and make it easier to clear the chest.  HOME CARE INSTRUCTIONS  Get plenty of rest.   Drink enough fluids to keep your urine clear or pale yellow (unless you have a medical condition that requires fluid restriction). Increasing fluids may help thin your respiratory secretions (sputum) and reduce chest congestion, and it will prevent dehydration.   Take medicines only as directed by your health care provider.  If  you were prescribed an antibiotic medicine, finish it all even if you start to feel better.  Avoid smoking and secondhand smoke. Exposure to cigarette smoke or irritating chemicals will make bronchitis worse. If you are a smoker, consider using nicotine gum or skin patches to help control withdrawal symptoms. Quitting smoking will help your lungs heal faster.   Reduce the chances of another bout of acute bronchitis by washing your hands frequently, avoiding people with cold symptoms, and trying not to touch your hands to your mouth, nose, or eyes.   Keep all follow-up visits as directed by your health care provider.  SEEK MEDICAL CARE IF: Your symptoms do not improve after 1 week of treatment.  SEEK IMMEDIATE MEDICAL CARE IF:  You develop an increased fever or chills.   You have chest pain.   You have severe shortness of breath.  You have bloody sputum.   You develop dehydration.  You faint or repeatedly feel like you are going to pass out.  You develop repeated vomiting.  You develop a severe headache. MAKE SURE YOU:   Understand these instructions.  Will watch your condition.  Will get help right away if you are not doing well or get worse.   This information is not intended to replace advice given to you by your health care provider. Make sure you discuss any questions you have with your health care provider.   Document Released: 07/16/2004 Document Revised: 06/29/2014 Document Reviewed: 11/29/2012 Elsevier Interactive Patient Education 2016 [place] ahead and take extra dose of Metoprolol tonight Follow up for any fever or increased shortness of  breath.

## 2015-10-07 NOTE — Progress Notes (Signed)
Subjective:    Patient ID: [name], female    DOB: 1945-07-15, 70 y.o.   MRN: [id number]  HPI Patient seen for acute visit. Onset 3-4 days ago of myalgias, cough, body aches, and mild nasal congestion. Husband with similar symptoms. No documented fever but has had some intermittent sweats. Generally feels poor with increased fatigue. She has some chest wall pain with cough. No exertional CP.   She has history of atrial fibrillation and mitral valve prolapse with mitral valve regurgitation. Recent echo showed increase in mitral regurgitation. She has follow-up with cardiology soon. She's had some increased palpitation recently but no chest pains other than chest wall pain with cough-as above No syncope  Past Medical History  Diagnosis Date  . INSOMNIA, CHRONIC 03/29/2009  . Skin cancer of arm     Squamous  . MVP (mitral valve prolapse)     NO MEDS  . Osteopenia 01/2014    T score -1.8 FRAX 8.8%/1.3%  . Heart murmur     mild mvr  . PONV (postoperative nausea and vomiting)   . Prepatellar bursitis     RT KNEE  . History of melanoma excision   . History of palpitations    Past Surgical History  Procedure Laterality Date  . Appendectomy  1969  . Tonsillectomy  1980  . Abdominal hysterectomy  1980    WITH BSO/FIBROIDS  . Oophorectomy      BSO WITH ABDOMINAL HYSTERECTOMY  . Thyroid cyst excision  05-01-11    RIGHT  . Shoulder arthroscopy  2010    right  . Colonoscopy    . Diagnostic laparoscopy      expl  . Excision melanoma with sentinel lymph node biopsy Right 10/02/2013    Procedure: WIDE EXCISION MELANOMA RIGHT ARM WITH SENTINEL LYMPH NODE BIOPSY;  Surgeon: Shann Medal, MD;  Location: [facility];  Service: General;  Laterality: Right;  . Knee bursectomy Right 03/05/2014    Procedure: RIGHT KNEE BURSECTOMY;  Surgeon: Gearlean Alf, MD;  Location: [facility];  Service: Orthopedics;  Laterality: Right;    reports that she quit smoking about 45 years ago.  Her smoking use included Cigarettes. She has a 20 pack-year smoking history. She has never used smokeless tobacco. She reports that she drinks about 4.2 oz of alcohol per week. She reports that she does not use illicit drugs. family history includes Alcohol abuse in her father; Breast cancer in her maternal grandmother; Diabetes type II in her mother; Ovarian cancer in her maternal aunt; Throat cancer in her father. Allergies  Allergen Reactions  . Inderal [Propranolol Hcl]     SEVERE LIP AND SKIN DRYNESS  . Tape     Very sensitive skin-  . Latex Itching    Sensitive to latex      Review of Systems  Constitutional: Positive for fatigue. Negative for fever.  HENT: Positive for congestion. Negative for sore throat.   Respiratory: Positive for cough and shortness of breath (only with exertion).   Cardiovascular: Positive for palpitations. Negative for leg swelling.  Gastrointestinal: Negative for nausea, vomiting and abdominal pain.  Genitourinary: Negative for dysuria.  Neurological: Negative for dizziness and syncope.  Hematological: Negative for adenopathy.       Objective:   Physical Exam  Constitutional: She is oriented to person, place, and time. She appears well-developed and well-nourished.  HENT:  Right Ear: External ear normal.  Left Ear: External ear normal.  Mouth/Throat: Oropharynx is clear and  moist.  Neck: Neck supple.  Cardiovascular:  Murmur heard. Irregularly regular rhythm. On initial auscultation increased tachycardia with heart rate around 130-palpated at 110.    Pulmonary/Chest: Effort normal and breath sounds normal. No respiratory distress. She has no wheezes. She has no rales.  Musculoskeletal: She exhibits no edema.  Lymphadenopathy:    She has no cervical adenopathy.  Neurological: She is alert and oriented to person, place, and time.          Assessment & Plan:  #1  Viral Syndrome.  Non-focal exam.  Stay hydrated.  Follow up for any fever or  other concerns  #2 Atrial fibrillation- rate up slightly today.  EKG-?flutter waves with ventricular rate of about 112.  She will increase her Toprol XL 25 mg to two tablets daily until cardiology follow up. Continue with Eliquis.  #3 MVP with increasing mitral regurgitation.  Follow up with cardiology on Wednesday.

## 2015-10-08 ENCOUNTER — Telehealth: Payer: Self-pay | Admitting: Family Medicine

## 2015-10-08 NOTE — Telephone Encounter (Signed)
Pt is concerned now coughing up chunks of white phlegm was not happening on yesterday and was not given any medicine.

## 2015-10-08 NOTE — Telephone Encounter (Signed)
Would not start antibiotic unless any fever + increasing shortness of breath.  White phlegm production not unusual for acute bronchitis.

## 2015-10-09 ENCOUNTER — Ambulatory Visit: Payer: Medicare Other | Admitting: Cardiology

## 2015-10-09 ENCOUNTER — Encounter: Payer: Self-pay | Admitting: Cardiology

## 2015-10-09 ENCOUNTER — Ambulatory Visit (INDEPENDENT_AMBULATORY_CARE_PROVIDER_SITE_OTHER): Payer: Medicare Other | Admitting: Cardiology

## 2015-10-09 VITALS — BP 114/62 | HR 82 | Ht 66.0 in | Wt 141.0 lb

## 2015-10-09 DIAGNOSIS — I480 Paroxysmal atrial fibrillation: Secondary | ICD-10-CM

## 2015-10-09 DIAGNOSIS — I059 Rheumatic mitral valve disease, unspecified: Secondary | ICD-10-CM

## 2015-10-09 DIAGNOSIS — I4891 Unspecified atrial fibrillation: Secondary | ICD-10-CM | POA: Diagnosis not present

## 2015-10-09 DIAGNOSIS — I340 Nonrheumatic mitral (valve) insufficiency: Secondary | ICD-10-CM

## 2015-10-09 LAB — CBC
HCT: 42.6 % (ref 35.0–45.0)
Hemoglobin: 14.4 g/dL (ref 11.7–15.5)
MCH: 33.2 pg — AB (ref 27.0–33.0)
MCHC: 33.8 g/dL (ref 32.0–36.0)
MCV: 98.2 fL (ref 80.0–100.0)
MPV: 11 fL (ref 7.5–12.5)
Platelets: 209 10*3/uL (ref 140–400)
RBC: 4.34 MIL/uL (ref 3.80–5.10)
RDW: 13.4 % (ref 11.0–15.0)
WBC: 3.3 10*3/uL — ABNORMAL LOW (ref 3.8–10.8)

## 2015-10-09 LAB — BASIC METABOLIC PANEL
BUN: 19 mg/dL (ref 7–25)
CHLORIDE: 106 mmol/L (ref 98–110)
CO2: 22 mmol/L (ref 20–31)
Calcium: 9.6 mg/dL (ref 8.6–10.4)
Creat: 1 mg/dL — ABNORMAL HIGH (ref 0.50–0.99)
Glucose, Bld: 96 mg/dL (ref 65–99)
Potassium: 4.1 mmol/L (ref 3.5–5.3)
Sodium: 142 mmol/L (ref 135–146)

## 2015-10-09 MED ORDER — METOPROLOL SUCCINATE ER 25 MG PO TB24: 25.0000 mg | ORAL_TABLET | Freq: Two times a day (BID) | ORAL | Status: DC

## 2015-10-09 MED ORDER — DOXYCYCLINE HYCLATE 100 MG PO CAPS: 100.0000 mg | ORAL_CAPSULE | Freq: Two times a day (BID) | ORAL | Status: AC

## 2015-10-09 NOTE — Patient Instructions (Signed)
A chest x-ray takes a picture of the organs and structures inside the chest, including the heart, lungs, and blood vessels. This test can show several things, including, whether the heart is enlarges; whether fluid is building up in the lungs; and whether pacemaker / defibrillator leads are still in place.  Your physician has recommended you make the following change in your medication:  1.) doxycycline 100 mg twice daily for 7 days 2.) increase Toprol to 25 mg twice daily  Your physician has requested that you have a cardiac catheterization. Cardiac catheterization is used to diagnose and/or treat various heart conditions. Doctors may recommend this procedure for a number of different reasons. The most common reason is to evaluate chest pain. Chest pain can be a symptom of coronary artery disease (CAD), and cardiac catheterization can show whether plaque is narrowing or blocking your heart's arteries. This procedure is also used to evaluate the valves, as well as measure the blood flow and oxygen levels in different parts of your heart. For further information please visit [email or web address]. Please follow instruction sheet, as given.  Your physician has requested that you have a TEE. During a TEE, sound waves are used to create images of your heart. It provides your doctor with information about the size and shape of your heart and how well your heart's chambers and valves are working. In this test, a transducer is attached to the end of a flexible tube that's guided down your throat and into your esophagus (the tube leading from you mouth to your stomach) to get a more detailed image of your heart. You are not awake for the procedure. Please see the instruction sheet given to you today. For further information please visit [email or web address].   Your physician recommends that you return for lab work in: today (bmet, cbc, inr)  You have been referred to Dr. Roxy Manns to discuss mitral valve  repair/replacement and maze procedure  Your physician recommends that you schedule a follow-up appointment in: 2 months with Dr. Aundra Dubin.

## 2015-10-09 NOTE — Telephone Encounter (Signed)
Pt is aware.  

## 2015-10-10 ENCOUNTER — Telehealth: Payer: Self-pay | Admitting: Cardiology

## 2015-10-10 ENCOUNTER — Ambulatory Visit (INDEPENDENT_AMBULATORY_CARE_PROVIDER_SITE_OTHER)
Admission: RE | Admit: 2015-10-10 | Discharge: 2015-10-10 | Disposition: A | Discharge status: Home / Self Care | Payer: Medicare Other | Source: Ambulatory Visit | Attending: Cardiology | Admitting: Cardiology

## 2015-10-10 DIAGNOSIS — I059 Rheumatic mitral valve disease, unspecified: Secondary | ICD-10-CM | POA: Diagnosis not present

## 2015-10-10 DIAGNOSIS — R0602 Shortness of breath: Secondary | ICD-10-CM | POA: Diagnosis not present

## 2015-10-10 DIAGNOSIS — I480 Paroxysmal atrial fibrillation: Secondary | ICD-10-CM | POA: Diagnosis not present

## 2015-10-10 DIAGNOSIS — R05 Cough: Secondary | ICD-10-CM | POA: Diagnosis not present

## 2015-10-10 LAB — PROTIME-INR
INR: 0.98 (ref <1.50)
PROTHROMBIN TIME: 13.1 s (ref 11.6–15.2)

## 2015-10-10 NOTE — Telephone Encounter (Signed)
Can give her Diflucan 150 mg po x 1.

## 2015-10-10 NOTE — Telephone Encounter (Signed)
Pt requesting prescription for diflucan in addition to doxycycline since she usually gets a yeast infection when she takes doxycycline. Pt advised I will forward to Dr Aundra Dubin for review.

## 2015-10-10 NOTE — Progress Notes (Signed)
Patient ID: [name], female   DOB: [birth year], 70 y.o.   MRN: [id number] PCP: Dr. [name]  70 yo with history of mitral valve prolapse and mitral regurgitation presents for followup.  Workup was done for mitral regurgitation initially in 2012.  She has bileaflet MV prolapse.  Mitral regurgitation was moderate to severe by TEE in 6/12.  ETT showed excellent exercise tolerance with no evidence for exercise limitation from MR.  We planned to continue monitoring her mitral regurgitation periodically.  Patient was then admitted in 10/12 with severe chest pain while working in her yard.  She had a left heart cath showing only mild luminal irregularities. Echo showed moderate mitral regurgitation.  3 week event monitor showed only PACs.    Patient developed sinus pressure/drainage concerning for sinusitis in 5/16.  During this illness, she noted her heart thumping/beating irregularly.  She developed dyspnea with vigorous walking or vacuuming.  No syncope, lightheadedness, or chest pain.  Symptoms persisted, so she saw Dr [name].  She was found to be atrial fibrillation with mild RVR.  She was started on Toprol XL and Eliquis.  She converted back to NSR.  She wore a 30 day event monitor in 5/16 that showed a total of 6 minutes atrial fibrillation.    I had her do a repeat echo in 4/17.  This showed worsened mitral regurgitation, now severe.  EF remained 60-65%.  She still was doing well with no exertional dyspnea until late last week when she developed a cough and congestion.  She has had significant fatigue and mild dyspnea walking for about 4-5 days. No fever.  She is noted today to be back in atrial fibrillation. Weight is down 6 lbs.   ECG: atrial fibrillation at 112 bpm.  Labs (5/12): TSH normal, LDL 68, HDL 58, cardiac enzymes negative x 3 Labs (4/14): K 4.2, creatinine 0.8, TSH normal, BNP 38 Labs (4/15): LDL 100, HDL 93 Labs (9/15): 37.4 Labs (4/16): TSH normal Labs (5/16): K 4.1, creatinine  0.86, HCT 38.3 Labs (10/16): K 3.5, creatinine 0.8  ECG: NSR, normal  PMH:  1. Mitral valve prolapse: Known since teenager.  Echo (5/12): EF 60%, normal LV size, grade II diastolic dysfunction, myxomatous mitral valve with bileaflet prolapse and moderate to severe mitral regurgitation, mild to moderate TR, PA systolic pressure 39 mmHg.  TEE (6/12) with EF 60%, moderate to severe MR with bileaflet prolapse and no systolic flow reversal in the pulmonary vein doppler signal, mild to moderate LAE.  ETT for exercise capacity (7/12): 12'19" on treadmill, no evidence of symptomatic limitation from mitral regurgitation. Echo (10/12): EF 60-65%, severe MVP with moderate MR.  Echo (4/14) with EF 60-65%, moderate MR with bileaflet MVP and normal RV.  Echo (4/16) with EF 55-60%, MVP with moderate mitral regurgitation, severe LAE.  Echo (4/17) with EF 60-65%, bileaflet mitral valve prolapse with severe MR, moderate TR.   2. Right shoulder surgery 2012.  3. Pulmonary nodule: followed by Dr. [name] 4. Thyroid nodule s/p biopsy: benign.  5. TAH-BSO due to endometriosis.  6. Appendectomy.  7. Chest pain: Lexiscan myoview (6/12):  EF 63%, no ischemia or infarction.  Left heart cath (10/12): Mild luminal irregularities only.  8. Atrial fibrillation: Paroxysmal, 1st noted in 4/16.  Event monitor 5/16 with 6 minutes of atrial fibrillation noted.    SH: Quit smoking at age 57.  Moved to [place] from [place] several years ago.  Married.  2 glasses wine/night.   FH: No CAD or  valvular disease.   ROS: all systems reviewed and negative except as per HPI.   Current Outpatient Prescriptions  Medication Sig Dispense Refill  . acetaminophen (TYLENOL) 500 MG tablet Take 500 mg by mouth every 6 (six) hours as needed for mild pain or headache.    . ALPRAZolam (XANAX) 0.5 MG tablet Take one tablet every 8 hours as needed severe anxiety. Take one hour prior to flying. (Patient not taking: Reported on 10/07/2015) 30 tablet 0  .  doxycycline (VIBRAMYCIN) 100 MG capsule Take 1 capsule (100 mg total) by mouth 2 (two) times daily. 14 capsule 0  . ELIQUIS 5 MG TABS tablet TAKE 1 TABLET BY MOUTH TWICE DAILY 60 tablet 2  . metoprolol succinate (TOPROL-XL) 25 MG 24 hr tablet Take 1 tablet (25 mg total) by mouth 2 (two) times daily. 90 tablet 3  . nystatin (MYCOSTATIN/NYSTOP) 100000 UNIT/GM POWD Apply 1 application topically daily as needed.  2  . zolpidem (AMBIEN) 10 MG tablet Take 1 tablet (10 mg total) by mouth at bedtime as needed. 30 tablet 0   No current facility-administered medications for this visit.    BP 114/62 mmHg  Pulse 82  Ht 5\' 6"  (1.676 m)  Wt 141 lb (63.957 kg)  BMI 22.77 kg/m2  LMP 06/22/1977 General: NAD Neck: No JVD, no thyromegaly or thyroid nodule.  Lungs: Clear to auscultation bilaterally with normal respiratory effort. CV: Nondisplaced PMI.  Heart regular S1/S2, no [id number], 3/6 systolic murmur at apex.  No peripheral edema.  No carotid bruit.  Normal pedal pulses.  Abdomen: Soft, nontender, no hepatosplenomegaly, no distention.  Neurologic: Alert and oriented x 3.  Psych: Normal affect. Extremities: No clubbing or cyanosis.   Assessment/Plan: 1. Mitral regurgitation: Moderate MR with MVP on echo in 4/16.  Repeat echo in 4/17 showed clear progression of MR to severe.  Prior to the current acute bronchitis, she did not have exertional symptoms.  However, given the development of paroxysmal atrial fibrillation, I think that she needs mitral valve repair.  We discussed this today.  - I will do TEE and LHC/RHC in preparation for valve repair.  I explained the risks/benefits of these procedures and she agrees to have them done.   - I think that she would be a good candidate for minimally invasive mitral repair.  I will refer her to Dr Roxy Manns.  2. Atrial fibrillation: Paroxysmal.  She has LAE with severe MR, so has substrate for atrial fibrillation.  CHADSVASC = 2 (gender, age).  She is back in atrial  fibrillation today in the setting of acute bronchitis.  - Continue Eliquis 5 mg bid (CBC/BMET today).   - Increase Toprol XL to 25 mg bid.   - Limit ETOH. - She does not screen positive for OSA.   - She will need MV repair.  I think that she would benefit from MAZE procedure at time of MV repair. 3. Acute bronchitis: I will arrange for CXR to rule out PNA and will give her a course of doxycycline.   [name] 10/10/2015

## 2015-10-10 NOTE — Telephone Encounter (Signed)
LMTCB

## 2015-10-10 NOTE — Telephone Encounter (Signed)
New Message:  Pt called in stating that the Doxycycline gives her a yeast infection and that she would rather take Diflucan. Please f/u with her . She would like this called in to the [facility] on [address].

## 2015-10-11 ENCOUNTER — Encounter: Payer: Self-pay | Admitting: *Deleted

## 2015-10-11 ENCOUNTER — Telehealth: Payer: Self-pay | Admitting: Cardiology

## 2015-10-11 MED ORDER — FLUCONAZOLE 150 MG PO TABS: ORAL_TABLET | ORAL | Status: DC

## 2015-10-11 NOTE — Telephone Encounter (Signed)
Pt requesting a letter for her gym stating for medical reasons starting May 1,2017 for 6 weeks she will be unable to attend her gym.  Pt advised I will forward to Dr Aundra Dubin for review.

## 2015-10-11 NOTE — Telephone Encounter (Signed)
That would be fine 

## 2015-10-11 NOTE — Telephone Encounter (Signed)
Pt advised that Dr Aundra Dubin recommends holding Eliquis 2 days before and day of procedure, pt verbalized understanding. Pt advised Dr Aundra Dubin will prescribe diflucan 150mg  po x 1.

## 2015-10-11 NOTE — Telephone Encounter (Signed)
-----   Message from Larey Dresser, MD sent at 10/10/2015  8:33 PM EDT ----- Regarding: RE: TEE R and LHC 5/1 Hold Eliquis 2 days prior to procedure and day of procedure.   ----- Message -----    From: Katrine Coho, RN    Sent: 10/10/2015   2:39 PM      To: Larey Dresser, MD Subject: TEE R and LHC 5/1                              I have scheduled her for a TEE and R and LHC 10/21/15 starting at [facility].  She is on Eliquis 5mg  bid for PAF, should she continue Eliquis prior to both procedures?  Thanks.

## 2015-10-11 NOTE — Telephone Encounter (Signed)
New message   Pt states that rn is sending her documents and she is also requesting that she writes a letter stating that she is   having surgery and that she can no longer attend the gym she don't want to be billed, or pay a cancellation fee

## 2015-10-11 NOTE — Telephone Encounter (Signed)
Pt aware this has been done and letter mailed to her.

## 2015-10-16 DIAGNOSIS — L821 Other seborrheic keratosis: Secondary | ICD-10-CM | POA: Diagnosis not present

## 2015-10-16 DIAGNOSIS — D485 Neoplasm of uncertain behavior of skin: Secondary | ICD-10-CM | POA: Diagnosis not present

## 2015-10-16 DIAGNOSIS — C44729 Squamous cell carcinoma of skin of left lower limb, including hip: Secondary | ICD-10-CM | POA: Diagnosis not present

## 2015-10-17 ENCOUNTER — Telehealth: Payer: Self-pay | Admitting: Cardiology

## 2015-10-17 NOTE — Telephone Encounter (Signed)
Spoke with patient, answered questions about cath.

## 2015-10-17 NOTE — Telephone Encounter (Signed)
New Message:  Pt is calling with some questions for her Cath that she will be having on 5/1. Please f/u with her

## 2015-10-20 ENCOUNTER — Other Ambulatory Visit (HOSPITAL_COMMUNITY): Payer: Self-pay | Admitting: Cardiology

## 2015-10-20 DIAGNOSIS — I340 Nonrheumatic mitral (valve) insufficiency: Secondary | ICD-10-CM

## 2015-10-21 ENCOUNTER — Ambulatory Visit (HOSPITAL_COMMUNITY)
Admission: RE | Admit: 2015-10-21 | Discharge: 2015-10-21 | Disposition: A | Discharge status: Home / Self Care | Payer: Medicare Other | Source: Ambulatory Visit | Attending: Cardiology | Admitting: Cardiology

## 2015-10-21 ENCOUNTER — Encounter (HOSPITAL_COMMUNITY)
Admission: RE | Disposition: A | Discharge status: Home / Self Care | Payer: Self-pay | Source: Ambulatory Visit | Attending: Cardiology

## 2015-10-21 ENCOUNTER — Encounter (HOSPITAL_COMMUNITY): Payer: Self-pay

## 2015-10-21 ENCOUNTER — Ambulatory Visit (HOSPITAL_BASED_OUTPATIENT_CLINIC_OR_DEPARTMENT_OTHER): Payer: Medicare Other

## 2015-10-21 DIAGNOSIS — I340 Nonrheumatic mitral (valve) insufficiency: Secondary | ICD-10-CM | POA: Diagnosis not present

## 2015-10-21 DIAGNOSIS — I480 Paroxysmal atrial fibrillation: Secondary | ICD-10-CM | POA: Diagnosis not present

## 2015-10-21 DIAGNOSIS — Z7901 Long term (current) use of anticoagulants: Secondary | ICD-10-CM | POA: Diagnosis not present

## 2015-10-21 DIAGNOSIS — I2584 Coronary atherosclerosis due to calcified coronary lesion: Secondary | ICD-10-CM | POA: Insufficient documentation

## 2015-10-21 DIAGNOSIS — J209 Acute bronchitis, unspecified: Secondary | ICD-10-CM | POA: Insufficient documentation

## 2015-10-21 DIAGNOSIS — Z87891 Personal history of nicotine dependence: Secondary | ICD-10-CM | POA: Diagnosis not present

## 2015-10-21 DIAGNOSIS — I2510 Atherosclerotic heart disease of native coronary artery without angina pectoris: Secondary | ICD-10-CM | POA: Diagnosis not present

## 2015-10-21 DIAGNOSIS — I341 Nonrheumatic mitral (valve) prolapse: Secondary | ICD-10-CM | POA: Diagnosis not present

## 2015-10-21 HISTORY — PX: CARDIAC CATHETERIZATION: SHX172

## 2015-10-21 HISTORY — PX: TEE WITHOUT CARDIOVERSION: SHX5443

## 2015-10-21 LAB — POCT I-STAT 3, VENOUS BLOOD GAS (G3P V)
ACID-BASE DEFICIT: 5 mmol/L — AB (ref 0.0–2.0)
Acid-base deficit: 8 mmol/L — ABNORMAL HIGH (ref 0.0–2.0)
BICARBONATE: 20 meq/L (ref 20.0–24.0)
Bicarbonate: 17.4 meq/L — ABNORMAL LOW (ref 20.0–24.0)
O2 Saturation: 70 %
O2 Saturation: 71 %
PH VEN: 7.348 — AB (ref 7.250–7.300)
PO2 VEN: 38 mmHg (ref 31.0–45.0)
TCO2: 21 mmol/L (ref 0–100)
TCO2: 18 mmol/L (ref 0–100)
pCO2, Ven: 32.6 mmHg — ABNORMAL LOW (ref 45.0–50.0)
pCO2, Ven: 36.5 mmHg — ABNORMAL LOW (ref 45.0–50.0)
pH, Ven: 7.336 — ABNORMAL HIGH (ref 7.250–7.300)
pO2, Ven: 39 mmHg (ref 31.0–45.0)

## 2015-10-21 SURGERY — ECHOCARDIOGRAM, TRANSESOPHAGEAL
Anesthesia: Moderate Sedation

## 2015-10-21 SURGERY — RIGHT/LEFT HEART CATH AND CORONARY ANGIOGRAPHY
Anesthesia: LOCAL

## 2015-10-21 MED ORDER — NITROGLYCERIN 1 MG/10 ML FOR IR/CATH LAB
INTRA_ARTERIAL | Status: DC | PRN
  Administered 2015-10-21: 100 ug via INTRACORONARY

## 2015-10-21 MED ORDER — SODIUM CHLORIDE 0.9% FLUSH: 3.0000 mL | Freq: Two times a day (BID) | INTRAVENOUS | Status: DC

## 2015-10-21 MED ORDER — SODIUM CHLORIDE 0.9 % WEIGHT BASED INFUSION: 3.0000 mL/kg/h | INTRAVENOUS | Status: AC

## 2015-10-21 MED ORDER — HEPARIN SODIUM (PORCINE) 1000 UNIT/ML IJ SOLN
INTRAMUSCULAR | Status: DC | PRN
  Administered 2015-10-21: 3500 [IU] via INTRAVENOUS

## 2015-10-21 MED ORDER — SODIUM CHLORIDE 0.9 % IV SOLN: 250.0000 mL | INTRAVENOUS | Status: DC | PRN

## 2015-10-21 MED ORDER — MIDAZOLAM HCL 5 MG/ML IJ SOLN
INTRAMUSCULAR | Status: AC
  Filled 2015-10-21: qty 2

## 2015-10-21 MED ORDER — ACETAMINOPHEN 325 MG PO TABS
650.0000 mg | ORAL_TABLET | ORAL | Status: DC | PRN
  Administered 2015-10-21: 650 mg via ORAL

## 2015-10-21 MED ORDER — SODIUM CHLORIDE 0.9 % WEIGHT BASED INFUSION: 1.0000 mL/kg/h | INTRAVENOUS | Status: DC

## 2015-10-21 MED ORDER — ONDANSETRON HCL 4 MG/2ML IJ SOLN: 4.0000 mg | Freq: Four times a day (QID) | INTRAMUSCULAR | Status: DC | PRN

## 2015-10-21 MED ORDER — HEPARIN (PORCINE) IN NACL 2-0.9 UNIT/ML-% IJ SOLN
INTRAMUSCULAR | Status: DC | PRN
  Administered 2015-10-21: 1000 mL

## 2015-10-21 MED ORDER — LIDOCAINE HCL (PF) 1 % IJ SOLN
INTRAMUSCULAR | Status: AC
  Filled 2015-10-21: qty 30

## 2015-10-21 MED ORDER — SODIUM CHLORIDE 0.9 % WEIGHT BASED INFUSION: 3.0000 mL/kg/h | INTRAVENOUS | Status: DC

## 2015-10-21 MED ORDER — VERAPAMIL HCL 2.5 MG/ML IV SOLN
INTRAVENOUS | Status: AC
  Filled 2015-10-21: qty 2

## 2015-10-21 MED ORDER — LIDOCAINE HCL (PF) 1 % IJ SOLN
INTRAMUSCULAR | Status: DC | PRN
  Administered 2015-10-21: 22 mL

## 2015-10-21 MED ORDER — MIDAZOLAM HCL 10 MG/2ML IJ SOLN
INTRAMUSCULAR | Status: DC | PRN
  Administered 2015-10-21 (×3): 2 mg via INTRAVENOUS

## 2015-10-21 MED ORDER — MIDAZOLAM HCL 2 MG/2ML IJ SOLN
INTRAMUSCULAR | Status: AC
  Filled 2015-10-21: qty 2

## 2015-10-21 MED ORDER — NITROGLYCERIN 1 MG/10 ML FOR IR/CATH LAB
INTRA_ARTERIAL | Status: AC
  Filled 2015-10-21: qty 10

## 2015-10-21 MED ORDER — HEPARIN (PORCINE) IN NACL 2-0.9 UNIT/ML-% IJ SOLN
INTRAMUSCULAR | Status: AC
  Filled 2015-10-21: qty 1000

## 2015-10-21 MED ORDER — SODIUM CHLORIDE 0.9% FLUSH: 3.0000 mL | INTRAVENOUS | Status: DC | PRN

## 2015-10-21 MED ORDER — IOPAMIDOL (ISOVUE-370) INJECTION 76%
INTRAVENOUS | Status: AC
  Filled 2015-10-21: qty 100

## 2015-10-21 MED ORDER — FENTANYL CITRATE (PF) 100 MCG/2ML IJ SOLN
INTRAMUSCULAR | Status: AC
  Filled 2015-10-21: qty 2

## 2015-10-21 MED ORDER — IOPAMIDOL (ISOVUE-370) INJECTION 76%
INTRAVENOUS | Status: DC | PRN
  Administered 2015-10-21: 70 mL via INTRAVENOUS

## 2015-10-21 MED ORDER — FENTANYL CITRATE (PF) 100 MCG/2ML IJ SOLN
INTRAMUSCULAR | Status: DC | PRN
  Administered 2015-10-21 (×2): 25 ug via INTRAVENOUS

## 2015-10-21 MED ORDER — BUTAMBEN-TETRACAINE-BENZOCAINE 2-2-14 % EX AERO
INHALATION_SPRAY | CUTANEOUS | Status: DC | PRN
  Administered 2015-10-21: 2 via TOPICAL

## 2015-10-21 MED ORDER — ONDANSETRON HCL 4 MG/2ML IJ SOLN
INTRAMUSCULAR | Status: AC
  Filled 2015-10-21: qty 2

## 2015-10-21 MED ORDER — HEPARIN SODIUM (PORCINE) 1000 UNIT/ML IJ SOLN
INTRAMUSCULAR | Status: AC
  Filled 2015-10-21: qty 1

## 2015-10-21 MED ORDER — ASPIRIN 81 MG PO CHEW: 81.0000 mg | CHEWABLE_TABLET | ORAL | Status: DC

## 2015-10-21 MED ORDER — ACETAMINOPHEN 325 MG PO TABS
ORAL_TABLET | ORAL | Status: AC
  Filled 2015-10-21: qty 2

## 2015-10-21 MED ORDER — MIDAZOLAM HCL 2 MG/2ML IJ SOLN
INTRAMUSCULAR | Status: DC | PRN
  Administered 2015-10-21 (×2): 1 mg via INTRAVENOUS

## 2015-10-21 MED ORDER — SODIUM CHLORIDE 0.9 % IV SOLN
INTRAVENOUS | Status: DC
Start: 2015-10-21 — End: 2015-10-21

## 2015-10-21 MED ORDER — ONDANSETRON HCL 4 MG/2ML IJ SOLN
4.0000 mg | Freq: Once | INTRAMUSCULAR | Status: AC
  Administered 2015-10-21: 4 mg via INTRAVENOUS

## 2015-10-21 MED ORDER — VERAPAMIL HCL 2.5 MG/ML IV SOLN
INTRAVENOUS | Status: DC | PRN
  Administered 2015-10-21: 10:00:00 via INTRA_ARTERIAL

## 2015-10-21 SURGICAL SUPPLY — 14 items
CATH INFINITI 5 FR JL3.5 (CATHETERS) ×2 IMPLANT
CATH INFINITI 5FR ANG PIGTAIL (CATHETERS) ×1 IMPLANT
CATH INFINITI JR4 5F (CATHETERS) ×1 IMPLANT
CATH SWAN GANZ 7F STRAIGHT (CATHETERS) ×1 IMPLANT
DEVICE RAD COMP TR BAND LRG (VASCULAR PRODUCTS) ×1 IMPLANT
GLIDESHEATH SLEND SS 6F .021 (SHEATH) ×1 IMPLANT
KIT HEART LEFT (KITS) ×2 IMPLANT
PACK CARDIAC CATHETERIZATION (CUSTOM PROCEDURE TRAY) ×2 IMPLANT
SHEATH PINNACLE 7F 10CM (SHEATH) ×1 IMPLANT
SYR MEDRAD MARK V 150ML (SYRINGE) ×2 IMPLANT
TRANSDUCER W/STOPCOCK (MISCELLANEOUS) ×3 IMPLANT
TUBING CIL FLEX 10 FLL-RA (TUBING) ×2 IMPLANT
WIRE HI TORQ VERSACORE-J 145CM (WIRE) ×1 IMPLANT
WIRE SAFE-T 1.5MM-J .035X260CM (WIRE) ×1 IMPLANT

## 2015-10-21 NOTE — Progress Notes (Signed)
Site area: rt groin fv sheath Site Prior to Removal:  Level 0 Pressure Applied For:  15 minutes Manual:   yes Patient Status During Pull:  stable Post Pull Site:  Level  0  Post Pull Instructions Given:  yes Post Pull Pulses Present: yes Dressing Applied:  Small tegaderm Bedrest begins @ 1045 Comments:

## 2015-10-21 NOTE — Interval H&P Note (Signed)
History and Physical Interval Note:  10/21/2015 8:11 AM  [name]  has presented today for surgery, with the diagnosis of MITRAL REGURGITAION  The various methods of treatment have been discussed with the patient and family. After consideration of risks, benefits and other options for treatment, the patient has consented to  Procedure(s): TRANSESOPHAGEAL ECHOCARDIOGRAM (TEE) (N/A) as a surgical intervention .  The patient's history has been reviewed, patient examined, no change in status, stable for surgery.  I have reviewed the patient's chart and labs.  Questions were answered to the patient's satisfaction.     [name] [employer]

## 2015-10-21 NOTE — CV Procedure (Signed)
Procedure: TEE  Indication: Mitral regurgitation  Sedation: Versed 6 mg IV, Fentanyl 50 mcg IV  Findings: Please see echo section for full report.  Normal LV size and systolic function, EF 123456.  Normal RV size and systolic function.  Mild left atrial enlargement with no LA appendage thrombus.  Normal right atrium.  No ASD/PFO, negative bubble study.  Mild TR.  The mitral valve was thickened with bileaflet prolapse.  There was severe mitral regurgitation.  ERO was 0.36 cm^2 but there were two jets and only one jet (the larger) was measured by PISA.  The aortic valve was trileaflet with no AS and trivial AI.  Trivial PI.  Normal caliber aorta with mild plaque in the descending thoracic aorta.   Impression: Severe mitral regurgitation from bileaflet mitral valve prolapse.    [name] 10/21/2015 8:41 AM

## 2015-10-21 NOTE — H&P (View-Only) (Signed)
Patient ID: [name], female   DOB: August 09, 1945, 70 y.o.   MRN: [id number] PCP: Dr. [name]  70 yo with history of mitral valve prolapse and mitral regurgitation presents for followup.  Workup was done for mitral regurgitation initially in 2012.  She has bileaflet MV prolapse.  Mitral regurgitation was moderate to severe by TEE in 6/12.  ETT showed excellent exercise tolerance with no evidence for exercise limitation from MR.  We planned to continue monitoring her mitral regurgitation periodically.  Patient was then admitted in 10/12 with severe chest pain while working in her yard.  She had a left heart cath showing only mild luminal irregularities. Echo showed moderate mitral regurgitation.  3 week event monitor showed only PACs.    Patient developed sinus pressure/drainage concerning for sinusitis in 5/16.  During this illness, she noted her heart thumping/beating irregularly.  She developed dyspnea with vigorous walking or vacuuming.  No syncope, lightheadedness, or chest pain.  Symptoms persisted, so she saw Dr [name].  She was found to be atrial fibrillation with mild RVR.  She was started on Toprol XL and Eliquis.  She converted back to NSR.  She wore a 30 day event monitor in 5/16 that showed a total of 6 minutes atrial fibrillation.    I had her do a repeat echo in 4/17.  This showed worsened mitral regurgitation, now severe.  EF remained 60-65%.  She still was doing well with no exertional dyspnea until late last week when she developed a cough and congestion.  She has had significant fatigue and mild dyspnea walking for about 4-5 days. No fever.  She is noted today to be back in atrial fibrillation. Weight is down 6 lbs.   ECG: atrial fibrillation at 112 bpm.  Labs (5/12): TSH normal, LDL 68, HDL 58, cardiac enzymes negative x 3 Labs (4/14): K 4.2, creatinine 0.8, TSH normal, BNP 38 Labs (4/15): LDL 100, HDL 93 Labs (9/15): 37.4 Labs (4/16): TSH normal Labs (5/16): K 4.1, creatinine  0.86, HCT 38.3 Labs (10/16): K 3.5, creatinine 0.8  ECG: NSR, normal  PMH:  1. Mitral valve prolapse: Known since teenager.  Echo (5/12): EF 60%, normal LV size, grade II diastolic dysfunction, myxomatous mitral valve with bileaflet prolapse and moderate to severe mitral regurgitation, mild to moderate TR, PA systolic pressure 39 mmHg.  TEE (6/12) with EF 60%, moderate to severe MR with bileaflet prolapse and no systolic flow reversal in the pulmonary vein doppler signal, mild to moderate LAE.  ETT for exercise capacity (7/12): 12'19" on treadmill, no evidence of symptomatic limitation from mitral regurgitation. Echo (10/12): EF 60-65%, severe MVP with moderate MR.  Echo (4/14) with EF 60-65%, moderate MR with bileaflet MVP and normal RV.  Echo (4/16) with EF 55-60%, MVP with moderate mitral regurgitation, severe LAE.  Echo (4/17) with EF 60-65%, bileaflet mitral valve prolapse with severe MR, moderate TR.   2. Right shoulder surgery 2012.  3. Pulmonary nodule: followed by Dr. [name] 4. Thyroid nodule s/p biopsy: benign.  5. TAH-BSO due to endometriosis.  6. Appendectomy.  7. Chest pain: Lexiscan myoview (6/12):  EF 63%, no ischemia or infarction.  Left heart cath (10/12): Mild luminal irregularities only.  8. Atrial fibrillation: Paroxysmal, 1st noted in 4/16.  Event monitor 5/16 with 6 minutes of atrial fibrillation noted.    SH: Quit smoking at age 25.  Moved to [place] from [place] several years ago.  Married.  2 glasses wine/night.   FH: No CAD or  valvular disease.   ROS: all systems reviewed and negative except as per HPI.   Current Outpatient Prescriptions  Medication Sig Dispense Refill  . acetaminophen (TYLENOL) 500 MG tablet Take 500 mg by mouth every 6 (six) hours as needed for mild pain or headache.    . ALPRAZolam (XANAX) 0.5 MG tablet Take one tablet every 8 hours as needed severe anxiety. Take one hour prior to flying. (Patient not taking: Reported on 10/07/2015) 30 tablet 0  .  doxycycline (VIBRAMYCIN) 100 MG capsule Take 1 capsule (100 mg total) by mouth 2 (two) times daily. 14 capsule 0  . ELIQUIS 5 MG TABS tablet TAKE 1 TABLET BY MOUTH TWICE DAILY 60 tablet 2  . metoprolol succinate (TOPROL-XL) 25 MG 24 hr tablet Take 1 tablet (25 mg total) by mouth 2 (two) times daily. 90 tablet 3  . nystatin (MYCOSTATIN/NYSTOP) 100000 UNIT/GM POWD Apply 1 application topically daily as needed.  2  . zolpidem (AMBIEN) 10 MG tablet Take 1 tablet (10 mg total) by mouth at bedtime as needed. 30 tablet 0   No current facility-administered medications for this visit.    BP 114/62 mmHg  Pulse 82  Ht 5\' 6"  (1.676 m)  Wt 141 lb (63.957 kg)  BMI 22.77 kg/m2  LMP 06/22/1977 General: NAD Neck: No JVD, no thyromegaly or thyroid nodule.  Lungs: Clear to auscultation bilaterally with normal respiratory effort. CV: Nondisplaced PMI.  Heart regular S1/S2, no [id number], 3/6 systolic murmur at apex.  No peripheral edema.  No carotid bruit.  Normal pedal pulses.  Abdomen: Soft, nontender, no hepatosplenomegaly, no distention.  Neurologic: Alert and oriented x 3.  Psych: Normal affect. Extremities: No clubbing or cyanosis.   Assessment/Plan: 1. Mitral regurgitation: Moderate MR with MVP on echo in 4/16.  Repeat echo in 4/17 showed clear progression of MR to severe.  Prior to the current acute bronchitis, she did not have exertional symptoms.  However, given the development of paroxysmal atrial fibrillation, I think that she needs mitral valve repair.  We discussed this today.  - I will do TEE and LHC/RHC in preparation for valve repair.  I explained the risks/benefits of these procedures and she agrees to have them done.   - I think that she would be a good candidate for minimally invasive mitral repair.  I will refer her to Dr Roxy Manns.  2. Atrial fibrillation: Paroxysmal.  She has LAE with severe MR, so has substrate for atrial fibrillation.  CHADSVASC = 2 (gender, age).  She is back in atrial  fibrillation today in the setting of acute bronchitis.  - Continue Eliquis 5 mg bid (CBC/BMET today).   - Increase Toprol XL to 25 mg bid.   - Limit ETOH. - She does not screen positive for OSA.   - She will need MV repair.  I think that she would benefit from MAZE procedure at time of MV repair. 3. Acute bronchitis: I will arrange for CXR to rule out PNA and will give her a course of doxycycline.   [name] 10/10/2015

## 2015-10-21 NOTE — Progress Notes (Signed)
Pt transferred to cath lab.  Vista Lawman, RN

## 2015-10-21 NOTE — Discharge Instructions (Signed)
Radial Site Care °Refer to this sheet in the next few weeks. These instructions provide you with information about caring for yourself after your procedure. Your health care provider may also give you more specific instructions. Your treatment has been planned according to current medical practices, but problems sometimes occur. Call your health care provider if you have any problems or questions after your procedure. °WHAT TO EXPECT AFTER THE PROCEDURE °After your procedure, it is typical to have the following: °· Bruising at the radial site that usually fades within 1-2 weeks. °· Blood collecting in the tissue (hematoma) that may be painful to the touch. It should usually decrease in size and tenderness within 1-2 weeks. °HOME CARE INSTRUCTIONS °· Take medicines only as directed by your health care provider. °· You may shower 24-48 hours after the procedure or as directed by your health care provider. Remove the bandage (dressing) and gently wash the site with plain soap and water. Pat the area dry with a clean towel. Do not rub the site, because this may cause bleeding. °· Do not take baths, swim, or use a hot tub until your health care provider approves. °· Check your insertion site every day for redness, swelling, or drainage. °· Do not apply powder or lotion to the site. °· Do not flex or bend the affected arm for 24 hours or as directed by your health care provider. °· Do not push or pull heavy objects with the affected arm for 24 hours or as directed by your health care provider. °· Do not lift over 10 lb (4.5 kg) for 5 days after your procedure or as directed by your health care provider. °· Ask your health care provider when it is okay to: °¨ Return to work or school. °¨ Resume usual physical activities or sports. °¨ Resume sexual activity. °· Do not drive home if you are discharged the same day as the procedure. Have someone else drive you. °· You may drive 24 hours after the procedure unless otherwise  instructed by your health care provider. °· Do not operate machinery or power tools for 24 hours after the procedure. °· If your procedure was done as an outpatient procedure, which means that you went home the same day as your procedure, a responsible adult should be with you for the first 24 hours after you arrive home. °· Keep all follow-up visits as directed by your health care provider. This is important. °SEEK MEDICAL CARE IF: °· You have a fever. °· You have chills. °· You have increased bleeding from the radial site. Hold pressure on the site. °SEEK IMMEDIATE MEDICAL CARE IF: °· You have unusual pain at the radial site. °· You have redness, warmth, or swelling at the radial site. °· You have drainage (other than a small amount of blood on the dressing) from the radial site. °· The radial site is bleeding, and the bleeding does not stop after 30 minutes of holding steady pressure on the site. °· Your arm or hand becomes pale, cool, tingly, or numb. °  °This information is not intended to replace advice given to you by your health care provider. Make sure you discuss any questions you have with your health care provider. °  °Document Released: 07/11/2010 Document Revised: 06/29/2014 Document Reviewed: 12/25/2013 °[employer] Interactive Patient Education ©2016 [employer] ° °

## 2015-10-21 NOTE — Interval H&P Note (Signed)
History and Physical Interval Note:  10/21/2015 9:31 AM  [name]  has presented today for surgery, with the diagnosis of mitrial regurgetation  The various methods of treatment have been discussed with the patient and family. After consideration of risks, benefits and other options for treatment, the patient has consented to  Procedure(s): Right/Left Heart Cath and Coronary Angiography (N/A) as a surgical intervention .  The patient's history has been reviewed, patient examined, no change in status, stable for surgery.  I have reviewed the patient's chart and labs.  Questions were answered to the patient's satisfaction.     Johathan Province [employer]

## 2015-10-21 NOTE — Progress Notes (Signed)
  Echocardiogram Echocardiogram Transesophageal has been performed.  Bobbye Charleston 10/21/2015, 8:49 AM

## 2015-10-22 ENCOUNTER — Encounter (HOSPITAL_COMMUNITY): Payer: Self-pay | Admitting: Cardiology

## 2015-10-29 ENCOUNTER — Encounter: Payer: Medicare Other | Admitting: Thoracic Surgery (Cardiothoracic Vascular Surgery)

## 2015-11-04 ENCOUNTER — Other Ambulatory Visit: Payer: Self-pay | Admitting: Thoracic Surgery (Cardiothoracic Vascular Surgery)

## 2015-11-04 ENCOUNTER — Encounter: Payer: Self-pay | Admitting: Thoracic Surgery (Cardiothoracic Vascular Surgery)

## 2015-11-04 ENCOUNTER — Ambulatory Visit (HOSPITAL_COMMUNITY)
Admission: RE | Admit: 2015-11-04 | Discharge: 2015-11-04 | Disposition: A | Discharge status: Home / Self Care | Payer: Medicare Other | Source: Ambulatory Visit | Attending: Thoracic Surgery (Cardiothoracic Vascular Surgery) | Admitting: Thoracic Surgery (Cardiothoracic Vascular Surgery)

## 2015-11-04 ENCOUNTER — Other Ambulatory Visit: Payer: Self-pay | Admitting: *Deleted

## 2015-11-04 ENCOUNTER — Institutional Professional Consult (permissible substitution) (INDEPENDENT_AMBULATORY_CARE_PROVIDER_SITE_OTHER): Payer: Medicare Other | Admitting: Thoracic Surgery (Cardiothoracic Vascular Surgery)

## 2015-11-04 ENCOUNTER — Other Ambulatory Visit: Payer: Self-pay | Admitting: Family Medicine

## 2015-11-04 VITALS — BP 130/80 | HR 70 | Resp 20 | Ht 67.0 in | Wt 140.0 lb

## 2015-11-04 DIAGNOSIS — N281 Cyst of kidney, acquired: Secondary | ICD-10-CM | POA: Diagnosis not present

## 2015-11-04 DIAGNOSIS — I2510 Atherosclerotic heart disease of native coronary artery without angina pectoris: Secondary | ICD-10-CM | POA: Insufficient documentation

## 2015-11-04 DIAGNOSIS — I7409 Other arterial embolism and thrombosis of abdominal aorta: Secondary | ICD-10-CM

## 2015-11-04 DIAGNOSIS — I340 Nonrheumatic mitral (valve) insufficiency: Secondary | ICD-10-CM

## 2015-11-04 DIAGNOSIS — R911 Solitary pulmonary nodule: Secondary | ICD-10-CM | POA: Diagnosis not present

## 2015-11-04 DIAGNOSIS — I4891 Unspecified atrial fibrillation: Secondary | ICD-10-CM

## 2015-11-04 DIAGNOSIS — I480 Paroxysmal atrial fibrillation: Secondary | ICD-10-CM

## 2015-11-04 DIAGNOSIS — I719 Aortic aneurysm of unspecified site, without rupture: Secondary | ICD-10-CM | POA: Diagnosis not present

## 2015-11-04 DIAGNOSIS — I341 Nonrheumatic mitral (valve) prolapse: Secondary | ICD-10-CM | POA: Diagnosis not present

## 2015-11-04 MED ORDER — GLUTARALDEHYDE 0.625% SOAKING SOLUTION: TOPICAL | Status: DC | PRN

## 2015-11-04 MED ORDER — AMIODARONE HCL 200 MG PO TABS: 200.0000 mg | ORAL_TABLET | Freq: Two times a day (BID) | ORAL | Status: DC

## 2015-11-04 MED ORDER — IOPAMIDOL (ISOVUE-370) INJECTION 76%
INTRAVENOUS | Status: AC
  Administered 2015-11-04: 100 mL
  Filled 2015-11-04: qty 100

## 2015-11-04 NOTE — Telephone Encounter (Signed)
Due for refill tomorrow.  

## 2015-11-04 NOTE — Progress Notes (Signed)
[address]       [place] [address]             [phone]     CARDIOTHORACIC SURGERY CONSULTATION REPORT  Referring Provider is Larey Dresser, MD PCP is [name], MD  Chief Complaint  Patient presents with  . Mitral Regurgitation    eval for MVR/MAZE...ECHO 10/04/15, TEE 10/21/15  . Atrial Fibrillation    HPI:  Patient is a 70 year old female with long-standing history of mitral valve prolapse and mitral regurgitation with been referred for surgical consultation to discuss treatment options for management of severe symptomatic primary mitral regurgitation with recurrent paroxysmal atrial fibrillation. The patient states that she was noted to have a heart murmur during her youth and has been followed intermittently ever since with known history of mitral valve prolapse.  Several years ago she moved to [place]. In May 2012 she was hospitalized with shortness of breath and atypical chest discomfort. She ruled out for myocardial infarction but echocardiogram revealed bileaflet prolapse with at least moderate mitral regurgitation and normal left ventricular systolic function. She was referred to Dr. Aundra Dubin who performed transesophageal echocardiogram and exercise treadmill test. The patient was found to have moderate to severe mitral regurgitation but normal exercise tolerance without signs of symptomatic mitral regurgitation. She has been followed carefully ever since.  The patient has remained physically active and exercises on a regular basis. In May 2016 the patient developed paroxysmal atrial fibrillation. She was started on Toprol-XL and Eliquis.  Symptoms of intermittent palpitations have persisted ever since.  Several months ago she began to experience shortness of breath with more strenuous activity.  Follow-up echocardiogram performed 10/04/2015 revealed progression in the severity of mitral regurgitation with preserved left ventricular systolic  function.  She was seen in follow-up by Dr. Benjamine Mola and subsequently underwent TEE and diagnostic cardiac catheterization on 10/21/2015. TEE confirmed the presence of bileaflet prolapse of the mitral valve with severe mitral regurgitation. Left ventricular size and systolic function remains normal. There was moderate left atrial enlargement.  Catheterization revealed moderate nonobstructive coronary artery disease with normal pulmonary artery pressures. [facility] surgical consultation was requested.  The patient is married and lives locally in [place] with her husband. She has remained physically active all of her adult life. She enjoys playing tennis and other aerobic exercise activity. She typically goes to the gym nearly every day and frequently walks 5 miles at a time. Last 2 or 3 months she has begun to experience exertional shortness of breath. This typically only occurs with more strenuous activity. She has never had any resting shortness of breath, PND, orthopnea, or lower extremity edema. Episodes of shortness of breath associated with mild "discomfort" in her chest. She has frequent palpitations. She has never had any dizzy spells or syncope.  This past week she has developed some mild nasal congestion without productive cough or fever.  She suspects that she may have a "cold"    Past Medical History  Diagnosis Date  . INSOMNIA, CHRONIC 03/29/2009  . Skin cancer of arm     Squamous  . MVP (mitral valve prolapse)   . Osteopenia 01/2014    T score -1.8 FRAX 8.8%/1.3%  . Heart murmur   . PONV (postoperative nausea and vomiting)   . Prepatellar bursitis     RT KNEE  . History of melanoma excision   . History of palpitations   . Mitral regurgitation     severe  by TEE  . Paroxysmal atrial fibrillation ([facility]) 10/18/2014    Past Surgical History  Procedure Laterality Date  . Appendectomy  1969  . Tonsillectomy  1980  . Abdominal hysterectomy  1980    WITH BSO/FIBROIDS  .  Oophorectomy      BSO WITH ABDOMINAL HYSTERECTOMY  . Thyroid cyst excision  05-01-11    RIGHT  . Shoulder arthroscopy  2010    right  . Colonoscopy    . Diagnostic laparoscopy      expl  . Excision melanoma with sentinel lymph node biopsy Right 10/02/2013    Procedure: WIDE EXCISION MELANOMA RIGHT ARM WITH SENTINEL LYMPH NODE BIOPSY;  Surgeon: Shann Medal, MD;  Location: [facility];  Service: General;  Laterality: Right;  . Knee bursectomy Right 03/05/2014    Procedure: RIGHT KNEE BURSECTOMY;  Surgeon: Gearlean Alf, MD;  Location: [facility];  Service: Orthopedics;  Laterality: Right;  . Cardiac catheterization N/A 10/21/2015    Procedure: Right/Left Heart Cath and Coronary Angiography;  Surgeon: Larey Dresser, MD;  Location: [facility];  Service: Cardiovascular;  Laterality: N/A;  . Tee without cardioversion N/A 10/21/2015    Procedure: TRANSESOPHAGEAL ECHOCARDIOGRAM (TEE);  Surgeon: Larey Dresser, MD;  Location: [facility];  Service: Cardiovascular;  Laterality: N/A;    Family History  Problem Relation Age of Onset  . Diabetes type II Mother   . Alcohol abuse Father   . Throat cancer Father   . Ovarian cancer Maternal Aunt   . Breast cancer Maternal Grandmother     Social History   Social History  . Marital Status: Married    Spouse Name: N/A  . Number of Children: N/A  . Years of Education: N/A   Occupational History  . retired    Social History Main Topics  . Smoking status: Former Smoker -- 1.00 packs/day for 20 years    Types: Cigarettes    Quit date: 08/11/1970  . Smokeless tobacco: Never Used  . Alcohol Use: 4.2 oz/week    7 Glasses of wine per week  . Drug Use: No  . Sexual Activity: Yes    Birth Control/ Protection: Surgical     Comment: intercourse age 63, sexual partners less than 5   Other Topics Concern  . Not on file   Social History Narrative    Current Outpatient Prescriptions  Medication Sig Dispense Refill  .  acetaminophen (TYLENOL) 500 MG tablet Take 500 mg by mouth every 6 (six) hours as needed for mild pain or headache.    . ALPRAZolam (XANAX) 0.5 MG tablet Take one tablet every 8 hours as needed severe anxiety. Take one hour prior to flying. 30 tablet 0  . atorvastatin (LIPITOR) 20 MG tablet TK 1 T PO QD  6  . ELIQUIS 5 MG TABS tablet TAKE 1 TABLET BY MOUTH TWICE DAILY 60 tablet 2  . fluconazole (DIFLUCAN) 150 MG tablet TK 1 T PO ONCE FOR 1 DOSE.  0  . fluticasone (FLONASE) 50 MCG/ACT nasal spray Place 1 spray into both nostrils daily as needed for allergies or rhinitis.    . hydroxypropyl methylcellulose / hypromellose (ISOPTO TEARS / GONIOVISC) 2.5 % ophthalmic solution Place 1 drop into both eyes as needed for dry eyes.    . metoprolol succinate (TOPROL-XL) 25 MG 24 hr tablet Take 1 tablet (25 mg total) by mouth 2 (two) times daily. 90 tablet 3  . nystatin (MYCOSTATIN/NYSTOP) 100000 UNIT/GM POWD Apply 1 application  topically daily as needed.  2  . zolpidem (AMBIEN) 10 MG tablet Take 1 tablet (10 mg total) by mouth at bedtime as needed. 30 tablet 0   No current facility-administered medications for this visit.    Allergies  Allergen Reactions  . Inderal [Propranolol Hcl]     SEVERE LIP AND SKIN DRYNESS  . Tape     Very sensitive skin-  . Latex Itching    Sensitive to latex      Review of Systems:   General:  normal appetite, decreased energy, no weight gain, no weight loss, no fever  Cardiac:  + chest pain with exertion, no chest pain at rest, +SOB with exertion, no resting SOB, no PND, no orthopnea, + palpitations, + arrhythmia, + atrial fibrillation, no LE edema, no dizzy spells, no syncope  Respiratory:  no shortness of breath, no home oxygen, no productive cough, no dry cough, + recent bronchitis, no wheezing, no hemoptysis, no asthma, no pain with inspiration or cough, no sleep apnea, no CPAP at night  GI:   no difficulty swallowing, no reflux, no frequent heartburn, no hiatal  hernia, no abdominal pain, no constipation, no diarrhea, no hematochezia, no hematemesis, no melena  GU:   no dysuria,  no frequency, no urinary tract infection, no hematuria, no kidney stones, no kidney disease  Vascular:  no pain suggestive of claudication, no pain in feet, + occasional leg cramps, no varicose veins, no DVT, no non-healing foot ulcer  Neuro:   no stroke, no TIA's, no seizures, no headaches, no temporary blindness one eye,  no slurred speech, no peripheral neuropathy, no chronic pain, no instability of gait, no memory/cognitive dysfunction  Musculoskeletal: no arthritis, no joint swelling, no myalgias, no difficulty walking, normal mobility   Skin:   no rash, no itching, no skin infections, no pressure sores or ulcerations  Psych:   no anxiety, no depression, no nervousness, no unusual recent stress  Eyes:   no blurry vision, no floaters, no recent vision changes, + wears glasses or contacts  ENT:   no hearing loss, no loose or painful teeth, no dentures, last saw dentist within the past 3 months  Hematologic:  + easy bruising, no abnormal bleeding, no clotting disorder, no frequent epistaxis  Endocrine:  no diabetes, does not check CBG's at home     Physical Exam:   BP 130/80 mmHg  Pulse 70  Resp 20  Ht 5\' 7"  (1.702 m)  Wt 140 lb (63.504 kg)  BMI 21.92 kg/m2  SpO2 98%  LMP 06/22/1977  General:    well-appearing  HEENT:  Unremarkable   Neck:   no JVD, no bruits, no adenopathy   Chest:   clear to auscultation, symmetrical breath sounds, no wheezes, no rhonchi   CV:   RRR, grade III/VI holosystolic murmur best at LV apex  Abdomen:  soft, non-tender, no masses   Extremities:  warm, well-perfused, pulses palpable, no LE edema  Rectal/GU  Deferred  Neuro:   Grossly non-focal and symmetrical throughout  Skin:   Clean and dry, no rashes, no breakdown   Diagnostic Tests:  Transthoracic Echocardiography  Patient: Eleisha, Antonio MR #: [id number] Study Date:  10/04/2015 Gender: F Age: 75 Height: 167.6 cm Weight: 66.7 kg BSA: 1.77 m^2 Pt. Status: Room:  SONOGRAPHER Blondell Reveal ATTENDING [name], M.D. ORDERING [name], M.D. REFERRING [name], M.D. PERFORMING [facility], Outpatient  cc:  ------------------------------------------------------------------- LV EF: 60% - 65%  ------------------------------------------------------------------- Indications: (I34.0).  ------------------------------------------------------------------- History: PMH:  PAF. Acquired from the patient and from the patient&'s chart. Mitral regurgitation. Mitral valve prolapse.  ------------------------------------------------------------------- Study Conclusions  - Left ventricle: The cavity size was normal. Systolic function was  normal. The estimated ejection fraction was in the range of 60%  to 65%. Wall motion was normal; there were no regional wall  motion abnormalities. Left ventricular diastolic function  parameters were normal. - Mitral valve: Moderate thickening, consistent with myxomatous  proliferation. Moderate, late systolicprolapse, involving the  anterior leaflet and the posterior leaflet. There was severe  regurgitation directed eccentrically and posteriorly. Severe  regurgitation is suggested by pulmonary vein systolic flow  reversal. - Left atrium: The atrium was mildly dilated. - Tricuspid valve: Mild prolapse. There was moderate regurgitation.  Impressions:  - There has been marked interval worsening of mitral regurgitation.  Transthoracic echocardiography. M-mode, complete 2D, spectral Doppler, and color Doppler. Birthdate: Patient birthdate: 03-12-1946. Age: Patient is 70 yr old. Sex: Gender: female. BMI: 23.7 kg/m^2. Blood pressure: 132/74 Patient status: Outpatient. Study date: Study date: 10/04/2015. Study time: 01:14 PM.  Location: [facility]  -------------------------------------------------------------------  ------------------------------------------------------------------- Left ventricle: The cavity size was normal. Systolic function was normal. The estimated ejection fraction was in the range of 60% to 65%. Wall motion was normal; there were no regional wall motion abnormalities. The transmitral flow pattern was normal. The deceleration time of the early transmitral flow velocity was normal. The pulmonary vein flow pattern was normal. The tissue Doppler parameters were normal. Left ventricular diastolic function parameters were normal.  ------------------------------------------------------------------- Aortic valve: Structurally normal valve. Trileaflet. Cusp separation was normal. Doppler: Transvalvular velocity was within the normal range. There was no stenosis. There was no regurgitation.  ------------------------------------------------------------------- Aorta: Aortic root: The aortic root was normal in size. Ascending aorta: The ascending aorta was normal in size.  ------------------------------------------------------------------- Mitral valve: Moderate thickening, consistent with myxomatous proliferation. Moderate, late systolicprolapse, involving the anterior leaflet and the posterior leaflet. Doppler: There was severe regurgitation directed eccentrically and posteriorly. Severe regurgitation is suggested by pulmonary vein systolic flow reversal. Peak gradient (D): 3 mm Hg.  ------------------------------------------------------------------- Left atrium: The atrium was mildly dilated.  ------------------------------------------------------------------- Right ventricle: The cavity size was normal. Systolic function was normal.  ------------------------------------------------------------------- Pulmonic valve: Poorly visualized. The valve  appears to be grossly normal. Doppler: There was no significant regurgitation.  ------------------------------------------------------------------- Tricuspid valve: Leaflet separation was normal. Mild prolapse. Doppler: Transvalvular velocity was within the normal range. There was moderate regurgitation.  ------------------------------------------------------------------- Pulmonary artery: Systolic pressure was within the normal range.  ------------------------------------------------------------------- Right atrium: The atrium was normal in size.  ------------------------------------------------------------------- Pericardium: There was no pericardial effusion.  ------------------------------------------------------------------- Measurements  Left ventricle Value Reference LV ID, ED, PLAX chordal (L) 41.2 mm 43 - 52 LV ID, ES, PLAX chordal 26.3 mm 23 - 38 LV fx shortening, PLAX chordal 36 % >=29 LV PW thickness, ED 10.91 mm --------- IVS/LV PW ratio, ED 0.98 <=1.3 Stroke volume, 2D 55 ml --------- Stroke volume/bsa, 2D 31 ml/m^2 --------- LV e&', lateral 8.97 cm/s --------- LV E/e&', lateral 9.86 --------- LV e&', medial 7.8 cm/s --------- LV E/e&', medial 11.33 --------- LV e&', average 8.39 cm/s --------- LV E/e&', average 10.54 ---------  Ventricular septum Value Reference IVS thickness, ED 10.68 mm ---------  LVOT Value Reference LVOT ID, S 19 mm  --------- LVOT area 2.84 cm^2 --------- LVOT ID 19 mm --------- LVOT peak velocity, S 102 cm/s --------- LVOT mean velocity, S 60.7 cm/s --------- LVOT VTI, S 19.2 cm --------- LVOT peak gradient,  S 4 mm Hg --------- Stroke volume (SV), LVOT DP 54.4 ml --------- Stroke index (SV/bsa), LVOT DP 30.8 ml/m^2 ---------  Aorta Value Reference Aortic root ID, ED 35 mm --------- Ascending aorta ID, A-P, S 36 mm ---------  Left atrium Value Reference LA ID, A-P, ES 39 mm --------- LA ID/bsa, A-P (H) 2.21 cm/m^2 <=2.2 LA volume, S 62 ml --------- LA volume/bsa, S 35.1 ml/m^2 --------- LA volume, ES, 1-p A4C 42 ml --------- LA volume/bsa, ES, 1-p A4C 23.7 ml/m^2 --------- LA volume, ES, 1-p A2C 80 ml --------- LA volume/bsa, ES, 1-p A2C 45.2 ml/m^2 ---------  Mitral valve Value Reference Mitral E-wave peak velocity 88.4 cm/s --------- Mitral A-wave peak velocity 68.1 cm/s --------- Mitral deceleration time (H) 257 ms 150 - 230 Mitral peak gradient, D 3 mm Hg --------- Mitral E/A ratio, peak 1.3 ---------  Pulmonary arteries Value Reference PA pressure, S, DP 29 mm Hg <=30  Tricuspid valve Value Reference Tricuspid regurg peak velocity 255 cm/s  --------- Tricuspid peak RV-RA gradient 26 mm Hg ---------  Systemic veins Value Reference Estimated CVP 3 mm Hg ---------  Right ventricle Value Reference RV pressure, S, DP 29 mm Hg <=30 RV s&', lateral, S 19.4 cm/s ---------  Legend: (L) and (H) mark values outside specified reference range.  ------------------------------------------------------------------- Prepared and Electronically Authenticated by  Sanda Klein, MD 2017-04-14T15:45:18       Transesophageal Echocardiography  Patient: [name], [name] MR #: [id number] Study Date: 10/21/2015 Gender: F Age: 55 Height: 167.6 cm Weight: 64 kg BSA: 1.73 m^2 Pt. Status: Room:  ADMITTING [name], M.D. ATTENDING [name], M.D. ORDERING [name], M.D. PERFORMING [name], M.D. REFERRING [name], M.D. SONOGRAPHER Roseanna Rainbow  cc:  ------------------------------------------------------------------- LV EF: 60% - 65%  ------------------------------------------------------------------- Indications: Mitral valve prolapse 424.0.  ------------------------------------------------------------------- History: PMH: Palpitations. MR. Atrial fibrillation. Atrial flutter. Coronary artery disease. Angina pectoris.  ------------------------------------------------------------------- Study Conclusions  - Left ventricle: The cavity size was normal. Wall thickness was  normal. Systolic function was normal. The estimated ejection  fraction was in the range of 60% to 65%. Wall motion was normal;  there were no regional wall motion abnormalities. - Aortic valve: There was no stenosis. There was trivial  regurgitation. - Aorta: Normal caliber aorta, mild  plaque descending thoracic  aorta. - Mitral valve: The mitral valve was thickened with bileaflet  prolapse. There was severe eccentric mitral regurgitation. ERO  was 0.36 cm^2 but there were two jets and only one jet (the  larger) was measured by PISA. Effective regurgitant orifice  (PISA): 0.36 cm^2. Regurgitant volume (PISA): 64 ml. - Left atrium: The atrium was moderately dilated. No evidence of  thrombus in the atrial cavity or appendage. - Right ventricle: The cavity size was normal. Systolic function  was normal. - Right atrium: No evidence of thrombus in the atrial cavity or  appendage. - Atrial septum: No defect or patent foramen ovale was identified.  Echo contrast study showed no right-to-left atrial level shunt,  at baseline or with provocation. - Tricuspid valve: Peak RV-RA gradient (S): 27 mm Hg.  Impressions:  - Severe mitral regurgitation from bileaflet mitral valve prolapse.  Diagnostic transesophageal echocardiography. 2D and color Doppler. Birthdate: Patient birthdate: 1946-01-01. Age: Patient is 70 yr old. Sex: Gender: female. BMI: 22.8 kg/m^2. Blood pressure:  152/98 Patient status: Inpatient. Study date: Study date: 10/21/2015. Study time: 08:02 AM. Location: [facility].  -------------------------------------------------------------------  ------------------------------------------------------------------- Left ventricle: The cavity size was normal. Wall thickness was normal. Systolic function was  normal. The estimated ejection fraction was in the range of 60% to 65%. Wall motion was normal; there were no regional wall motion abnormalities.  ------------------------------------------------------------------- Aortic valve: Trileaflet. Doppler: There was no stenosis. There was trivial regurgitation.  ------------------------------------------------------------------- Aorta: Normal caliber aorta, mild plaque  descending thoracic aorta.  ------------------------------------------------------------------- Mitral valve: The mitral valve was thickened with bileaflet prolapse. There was severe eccentric mitral regurgitation. ERO was 0.36 cm^2 but there were two jets and only one jet (the larger) was measured by PISA. Doppler: There was no evidence for stenosis.  ------------------------------------------------------------------- Left atrium: The atrium was moderately dilated. No evidence of thrombus in the atrial cavity or appendage.  ------------------------------------------------------------------- Atrial septum: No defect or patent foramen ovale was identified. Echo contrast study showed no right-to-left atrial level shunt, at baseline or with provocation.  ------------------------------------------------------------------- Right ventricle: The cavity size was normal. Systolic function was normal.  ------------------------------------------------------------------- Pulmonic valve: Doppler: There was trivial regurgitation.  ------------------------------------------------------------------- Tricuspid valve: Doppler: There was mild regurgitation.  ------------------------------------------------------------------- Right atrium: The atrium was normal in size. No evidence of thrombus in the atrial cavity or appendage.  ------------------------------------------------------------------- Pericardium: There was no pericardial effusion.  ------------------------------------------------------------------- Post procedure conclusions Ascending Aorta:  - Normal caliber aorta, mild plaque descending thoracic aorta.  ------------------------------------------------------------------- Measurements  Mitral valve Value Mitral regurg VTI, PISA 179 cm Mitral ERO, PISA 0.36 cm^2 Mitral regurg volume,  PISA 64 ml  Tricuspid valve Value Tricuspid regurg peak velocity 262 cm/s Tricuspid peak RV-RA gradient 27 mm Hg  Legend: (L) and (H) mark values outside specified reference range.  ------------------------------------------------------------------- Prepared and Electronically Authenticated by  [name], M.D. 2017-05-01T15:22:16   CARDIAC CATHETERIZATION Procedures    Right/Left Heart Cath and Coronary Angiography    Conclusion    1. Normal left and right heart filling pressures.  2. Nonobstructive CAD.  3. Normal EF, mitral regurgitation does appear as marked by LV-gram as by TEE, likely due to eccentricity.     Technique and Indications    Procedure: Right Heart Cath, Left Heart Cath, Selective Coronary Angiography, LV angiography  Indication: Severe mitral regurgitation.   Procedural Details: The right groin and right radial area were prepped, draped, and anesthetized with 1% lidocaine. Using the modified Seldinger technique a 7 French sheath was placed in the right femoral vein. A Swan-Ganz catheter was used for the right heart catheterization. Standard protocol was followed for recording of right heart pressures and sampling of oxygen saturations. Fick cardiac output was calculated. The right radial artery was entered using modified Seldinger technique and a 6 French sheath was placed. The patient received 3 mg IA verapamil and weight-based IV heparin. Standard Judkins catheters were used for selective coronary angiography and left ventriculography. There were no immediate procedural complications. The patient was transferred to the post catheterization recovery area for further monitoring.  During this procedure the patient is administered a total of Versed 2 mg and Fentanyl 50 mg to achieve and maintain moderate conscious sedation. The patient's heart rate, blood pressure, and oxygen saturation are  monitored continuously during the procedure. The period of conscious sedation is 39 minutes, of which I was present face-to-face 100% of this time.Estimated blood loss <50 mL.    Coronary Findings    Dominance: Right   Left Main  Short, no significant disease.     Left Anterior Descending  No significant disease.     Left Circumflex  Large vessel. Ostial calcified plaque with positive remodeling. Only about 20% stenosis.     Right  Coronary Artery  Proximal 40% stenosis.       Right Heart Pressures [facility] Procedural Findings: Hemodynamics (mmHg) RA mean 5 RV 23/6 PA 22/9 PCWP mean 11  Oxygen saturations: PA 70 AO 100  Cardiac Output (Fick) 3.89  Cardiac Index (Fick) 2.26    Wall Motion    EF 60-65%. 3+ mitral regurgitation.           Implants     No implant documentation for this case.    PACS Images    Show images for Cardiac catheterization     Link to Procedure Log    Procedure Log      Hemo Data       Most Recent Value   Fick Cardiac Output  3.89 L/min   Fick Cardiac Output Index  2.26 (L/min)/BSA   RA A Wave  5 mmHg   RA V Wave  7 mmHg   RA Mean  5 mmHg   RV Systolic Pressure  23 mmHg   RV Diastolic Pressure  1 mmHg   RV EDP  6 mmHg   PA Systolic Pressure  22 mmHg   PA Diastolic Pressure  9 mmHg   PA Mean  14 mmHg   PW A Wave  14 mmHg   PW V Wave  16 mmHg   PW Mean  11 mmHg   AO Systolic Pressure  123456 mmHg   AO Diastolic Pressure  61 mmHg   AO Mean  83 mmHg   LV Systolic Pressure  [id number] mmHg   LV Diastolic Pressure  4 mmHg   LV EDP  10 mmHg   Arterial Occlusion Pressure Extended Systolic Pressure  [id number] mmHg   Arterial Occlusion Pressure Extended Diastolic Pressure  67 mmHg   Arterial Occlusion Pressure Extended Mean Pressure  87 mmHg   Left Ventricular Apex Extended Systolic Pressure  123XX123 mmHg   Left Ventricular Apex Extended Diastolic Pressure  5 mmHg   Left Ventricular Apex Extended EDP Pressure   11 mmHg   QP/QS  1   TPVR Index  6.19 HRUI   TSVR Index  36.66 HRUI   PVR SVR Ratio  0.04   TPVR/TSVR Ratio  0.17        Impression:  Patient has stage D severe symptomatic primary mitral regurgitation with recurrent paroxysmal atrial fibrillation. She presents with recent onset symptoms of exertional shortness of breath consistent with chronic diastolic congestive heart failure, New York Heart Association functional class 1-2. I have personally reviewed the patient's recent transthoracic and transesophageal echocardiograms and diagnostic cardiac catheterization. 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 There is elongation of multiple primary chordae tendoneae without evidence for ruptured cords or flail segments. There are too broad jets of regurgitation directed centrally across the dilated left atrium.  The leaflets are moderately thickened and mildly redundant. Left ventricular size and systolic function appears normal. Diagnostic cardiac catheterization a double for moderate nonobstructive coronary artery disease with long segment diffuse 40% proximal stenosis of the right coronary artery but otherwise no significant flow limiting disease. There is codominant coronary circulation with a relatively large left circumflex coronary artery. Pulmonary artery pressures were normal. I agree the patient would likely best be treated with elective mitral valve repair and Maze procedure. She may be a good candidate for minimally invasive approach or surgery.   Plan:  The patient and her husband were counseled at length regarding the indications, risks and potential benefits of mitral valve  repair.  The rationale for elective surgery has been explained, including a comparison between surgery and continued medical therapy with close follow-up.  The likelihood of successful and durable valve repair has been discussed with particular reference to the  findings of their recent echocardiogram.  Based upon these findings and previous experience, I have quoted them a greater than 80 percent likelihood of successful valve repair.  Expectations for the patient's postoperative recovery have been discussed. The patient is very interested in proceeding with surgery in the near future.  We will arrange for CT angiography of the chest, abdomen, and pelvis to evaluate the feasibility of peripheral arterial cannulation for surgery.  We tentatively plan for surgery on Tuesday, 11/19/2015. The patient has been instructed to stop taking Eliquis and begin taking amiodarone on Wednesday, 11/13/2015. She will return for follow-up at that time.  All of her questions have been addressed.   I spent in excess of 90 minutes during the conduct of this office consultation and >50% of this time involved direct face-to-face encounter with the patient for counseling and/or coordination of their care.    [name], MD 11/04/2015 10:33 AM

## 2015-11-04 NOTE — Patient Instructions (Signed)
Patient has been instructed to stop taking Eliquis on Wednesday May 24th and begin taking Amiodarone  Patient should continue taking all other medications without change through the day before surgery.  Patient should have nothing to eat or drink after midnight the night before surgery.  On the morning of surgery patient should take only Toprol XL with a sip of water.

## 2015-11-06 ENCOUNTER — Ambulatory Visit (INDEPENDENT_AMBULATORY_CARE_PROVIDER_SITE_OTHER): Payer: Medicare Other | Admitting: Family Medicine

## 2015-11-06 VITALS — BP 110/80 | HR 122 | Temp 98.6°F | Ht 67.0 in | Wt 141.0 lb

## 2015-11-06 DIAGNOSIS — J019 Acute sinusitis, unspecified: Secondary | ICD-10-CM

## 2015-11-06 DIAGNOSIS — C44729 Squamous cell carcinoma of skin of left lower limb, including hip: Secondary | ICD-10-CM | POA: Diagnosis not present

## 2015-11-06 MED ORDER — AMOXICILLIN-POT CLAVULANATE 875-125 MG PO TABS: 1.0000 | ORAL_TABLET | Freq: Two times a day (BID) | ORAL | Status: DC

## 2015-11-06 MED ORDER — FLUCONAZOLE 150 MG PO TABS: 150.0000 mg | ORAL_TABLET | Freq: Once | ORAL | Status: DC

## 2015-11-06 NOTE — Patient Instructions (Signed)

## 2015-11-06 NOTE — Progress Notes (Signed)
Pre visit review using our clinic review tool, if applicable. No additional management support is needed unless otherwise documented below in the visit note. 

## 2015-11-06 NOTE — Progress Notes (Signed)
Subjective:    Patient ID: [name], female    DOB: [birth year], 70 y.o.   MRN: [id number]  HPI   Patient seen for acute visit with over one week history of bilateral maxillary facial pain and brownish to bloody nasal discharge. She has upper teeth pain. Increased malaise. Intermittent headaches. No fever. She is already taking Flonase for allergies symptoms and feels this is more than allergy.  She denies any cough at this time. She has mitral valve prolapse with severe mitral regurgitation and planned mitral valve surgery May 30.  Past Medical History  Diagnosis Date  . INSOMNIA, CHRONIC 03/29/2009  . Skin cancer of arm     Squamous  . MVP (mitral valve prolapse)   . Osteopenia 01/2014    T score -1.8 FRAX 8.8%/1.3%  . Heart murmur   . PONV (postoperative nausea and vomiting)   . Prepatellar bursitis     RT KNEE  . History of melanoma excision   . History of palpitations   . Mitral regurgitation     severe by TEE  . Paroxysmal atrial fibrillation ([place]) 10/18/2014   Past Surgical History  Procedure Laterality Date  . Appendectomy  1969  . Tonsillectomy  1980  . Abdominal hysterectomy  1980    WITH BSO/FIBROIDS  . Oophorectomy      BSO WITH ABDOMINAL HYSTERECTOMY  . Thyroid cyst excision  05-01-11    RIGHT  . Shoulder arthroscopy  2010    right  . Colonoscopy    . Diagnostic laparoscopy      expl  . Excision melanoma with sentinel lymph node biopsy Right 10/02/2013    Procedure: WIDE EXCISION MELANOMA RIGHT ARM WITH SENTINEL LYMPH NODE BIOPSY;  Surgeon: Shann Medal, MD;  Location: [facility];  Service: General;  Laterality: Right;  . Knee bursectomy Right 03/05/2014    Procedure: RIGHT KNEE BURSECTOMY;  Surgeon: Gearlean Alf, MD;  Location: [facility];  Service: Orthopedics;  Laterality: Right;  . Cardiac catheterization N/A 10/21/2015    Procedure: Right/Left Heart Cath and Coronary Angiography;  Surgeon: Larey Dresser, MD;  Location: [facility];  Service: Cardiovascular;  Laterality: N/A;  . Tee without cardioversion N/A 10/21/2015    Procedure: TRANSESOPHAGEAL ECHOCARDIOGRAM (TEE);  Surgeon: Larey Dresser, MD;  Location: [facility];  Service: Cardiovascular;  Laterality: N/A;    reports that she quit smoking about 45 years ago. Her smoking use included Cigarettes. She has a 20 pack-year smoking history. She has never used smokeless tobacco. She reports that she drinks about 4.2 oz of alcohol per week. She reports that she does not use illicit drugs. family history includes Alcohol abuse in her father; Breast cancer in her maternal grandmother; Diabetes type II in her mother; Ovarian cancer in her maternal aunt; Throat cancer in her father. Allergies  Allergen Reactions  . Inderal [Propranolol Hcl]     SEVERE LIP AND SKIN DRYNESS  . Tape     Very sensitive skin-  . Latex Itching    Sensitive to latex      Review of Systems  Constitutional: Positive for fatigue. Negative for fever and chills.  HENT: Positive for congestion and sinus pressure.   Respiratory: Negative for cough and shortness of breath.        Objective:   Physical Exam  Constitutional: She appears well-developed and well-nourished.  HENT:  Right Ear: External ear normal.  Left Ear: External ear normal.  Erythematous nasal  mucosa. Minimal dried blood left naris  Neck: Neck supple.  Cardiovascular: Normal rate.   Murmur heard. Pulmonary/Chest: Effort normal and breath sounds normal. No respiratory distress. She has no wheezes. She has no rales.  Lymphadenopathy:    She has no cervical adenopathy.          Assessment & Plan:  Probable acute bilateral maxillary sinusitis. She's had history of frequent sinusitis in the past. Augmentin 875 mg twice daily for 10 days. Stay well-hydrated. Follow-up promptly for any fever or worsening symptoms. She will take probiotic such as Maceo Pro MD [facility]

## 2015-11-12 ENCOUNTER — Other Ambulatory Visit: Payer: Self-pay | Admitting: Cardiology

## 2015-11-12 ENCOUNTER — Other Ambulatory Visit: Payer: Medicare Other

## 2015-11-13 ENCOUNTER — Ambulatory Visit: Payer: Medicare Other | Admitting: Thoracic Surgery (Cardiothoracic Vascular Surgery)

## 2015-11-14 ENCOUNTER — Ambulatory Visit (INDEPENDENT_AMBULATORY_CARE_PROVIDER_SITE_OTHER): Payer: Medicare Other | Admitting: Thoracic Surgery (Cardiothoracic Vascular Surgery)

## 2015-11-14 ENCOUNTER — Encounter: Payer: Self-pay | Admitting: Thoracic Surgery (Cardiothoracic Vascular Surgery)

## 2015-11-14 VITALS — BP 141/85 | HR 59 | Resp 16 | Ht 67.0 in | Wt 141.0 lb

## 2015-11-14 DIAGNOSIS — I341 Nonrheumatic mitral (valve) prolapse: Secondary | ICD-10-CM

## 2015-11-14 DIAGNOSIS — I340 Nonrheumatic mitral (valve) insufficiency: Secondary | ICD-10-CM

## 2015-11-14 DIAGNOSIS — I480 Paroxysmal atrial fibrillation: Secondary | ICD-10-CM | POA: Diagnosis not present

## 2015-11-14 NOTE — Patient Instructions (Signed)
Patient has been instructed to stop taking Eliquis  Patient should continue taking all other medications without change through the day before surgery.  Patient should have nothing to eat or drink after midnight the night before surgery.  On the morning of surgery patient should take only Toprol XL with a sip of water.

## 2015-11-14 NOTE — Progress Notes (Signed)
[address]       [place] [address]             [phone]     CARDIOTHORACIC SURGERY OFFICE NOTE  Referring Provider is Larey Dresser, MD PCP is [name], MD   HPI:  Patient returns to the office today for follow-up of stage D severe symptomatic primary mitral regurgitation with recurrent paroxysmal atrial fibrillation.  She was originally seen in consultation on 11/04/2015 and she returns to the office today with tentative plans to proceed with elective mitral valve repair and Maze procedure next week. Approximately 10 days ago she was seen by her primary care physician with complaints of sinus congestion and nasal discharge. She was given a prescription for Augmentin which she has nearly completed. She states that her symptoms have resolved completely and she now feels back to her baseline. She denies any history of fevers or chills. Her nasal congestion has resolved. She has not had any significant cough.  She reports no other problems or complaints.   Current Outpatient Prescriptions  Medication Sig Dispense Refill  . acetaminophen (TYLENOL) 500 MG tablet Take 500 mg by mouth every 6 (six) hours as needed for mild pain or headache.    Marland Kitchen amiodarone (PACERONE) 200 MG tablet Take 1 tablet (200 mg total) by mouth 2 (two) times daily. 60 tablet 0  . amoxicillin-clavulanate (AUGMENTIN) 875-125 MG tablet Take 1 tablet by mouth 2 (two) times daily. 20 tablet 0  . atorvastatin (LIPITOR) 20 MG tablet take 1 tablet by mouth daily  6  . fluticasone (FLONASE) 50 MCG/ACT nasal spray Place 1 spray into both nostrils daily as needed for allergies or rhinitis.    . hydroxypropyl methylcellulose / hypromellose (ISOPTO TEARS / GONIOVISC) 2.5 % ophthalmic solution Place 1 drop into both eyes as needed for dry eyes.    Marland Kitchen nystatin (MYCOSTATIN/NYSTOP) 100000 UNIT/GM POWD Apply 1 application topically daily as needed.  2  . zolpidem (AMBIEN) 10 MG tablet TAKE 1 TABLET  BY MOUTH AT BEDTIME AS NEEDED FOR SLEEP 30 tablet 0  . ELIQUIS 5 MG TABS tablet TAKE 1 TABLET BY MOUTH TWICE DAILY 60 tablet 2  . metoprolol succinate (TOPROL-XL) 25 MG 24 hr tablet Take 1 tablet (25 mg total) by mouth 2 (two) times daily. 180 tablet 3   No current facility-administered medications for this visit.   Facility-Administered Medications Ordered in Other Visits  Medication Dose Route Frequency Provider Last Rate Last Dose  . glutaraldehyde 0.625% soaking solution   Topical PRN Rexene Alberts, MD          Physical Exam:   BP 141/85 mmHg  Pulse 59  Resp 16  Ht 5\' 7"  (1.702 m)  Wt 141 lb (63.957 kg)  BMI 22.08 kg/m2  SpO2 100%  LMP 06/22/1977  General:  Well-appearing  Chest:   Clear to auscultation  CV:   Regular rate and rhythm with prominent systolic murmur  Incisions:  n/a  Abdomen:  Soft nontender  Extremities:  Warm and well-perfused  Diagnostic Tests:  CT ANGIOGRAPHY CHEST, ABDOMEN AND PELVIS  TECHNIQUE: Multidetector CT imaging through the chest, abdomen and pelvis was performed using the standard protocol during bolus administration of intravenous contrast. Multiplanar reconstructed images and MIPs were obtained and reviewed to evaluate the vascular anatomy.  CONTRAST: 100 mL of Isovue 370 intravenously.  COMPARISON: CT scan of November 30, 2013.  FINDINGS: CTA CHEST FINDINGS  No pneumothorax or pleural  effusion is noted. 14 x 10 mm nodular density is noted posteriorly in left lower lobe which is not significantly changed compared to prior exam. Stable 15 x 14 mm left retro hilar density is noted as well which is unchanged. These most likely represent benign lesions given the lack of change. Minimal bilateral posterior basilar subsegmental atelectasis is noted. There is no evidence of thoracic aortic dissection or aneurysm. Great vessels are widely patent at the origins, although most of the vessels are not included in the field-of-view on  this study and therefore cannot be evaluated. No mediastinal mass or adenopathy is noted. Mild coronary artery calcifications are noted. No significant osseous abnormality is noted.  Review of the MIP images confirms the above findings.  CTA ABDOMEN AND PELVIS FINDINGS  No gallstones are noted. The liver, spleen and pancreas unremarkable. Adrenal glands appear normal. No hydronephrosis or renal obstruction is noted. 7.4 cm cyst is seen involving the lower pole of the left kidney with peripheral calcification. Atherosclerosis of abdominal aorta is noted without aneurysm or dissection. The mesenteric and renal arteries are widely patent without significant stenosis. Iliac arteries are widely patent without significant stenosis. There is no evidence of bowel obstruction. No abnormal fluid collection is noted. Urinary bladder appears normal. Status post hysterectomy and appendectomy. Multilevel degenerative disc disease is noted in the thoracic spine.  Review of the MIP images confirms the above findings.  IMPRESSION: No evidence of thoracic or abdominal aortic aneurysm or dissection.  Mild coronary artery calcifications are noted suggesting coronary artery disease.  Stable left lower lobe nodular densities are noted compared to prior exam. These can be considered benign at this point with no further follow-up required.  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   Electronically Signed  By: Marijo Conception, M.D.  On: 11/04/2015 18:42    Impression:  Patient has stage D severe symptomatic primary mitral regurgitation with recurrent paroxysmal atrial fibrillation. She presents with recent onset symptoms of exertional shortness of breath consistent with chronic diastolic congestive heart failure, New York Heart Association functional class 1-2.  The patient has myxomatous  degenerative disease of the mitral valve with bileaflet prolapse. There is elongation of multiple primary chordae tendoneae without evidence for ruptured cords or flail segments. There are too broad jets of regurgitation directed centrally across the dilated left atrium. The leaflets are moderately thickened and mildly redundant. Left ventricular size and systolic function appears normal. Diagnostic cardiac catheterization a double for moderate nonobstructive coronary artery disease with long segment diffuse 40% proximal stenosis of the right coronary artery but otherwise no significant flow limiting disease. There is codominant coronary circulation with a relatively large left circumflex coronary artery. Pulmonary artery pressures were normal. I agree the patient would likely best be treated with elective mitral valve repair and Maze procedure. She appears to be a good candidate for minimally invasive approach or surgery.   Plan:  The patient and her husband were again counseled at length regarding the indications, risks and potential benefits of mitral valve repair.  The rationale for elective surgery has been explained, including a comparison between surgery and continued medical therapy with close follow-up.  The likelihood of successful and durable valve repair has been discussed with particular reference to the findings of their recent echocardiogram.  Based upon these findings and previous experience, I have quoted them a greater than 80 percent likelihood of successful valve repair.  In the unlikely event that their valve cannot be successfully repaired, we discussed the possibility of replacing the mitral valve using a mechanical prosthesis with the attendant need for long-term anticoagulation versus the alternative of replacing it using a bioprosthetic tissue valve with its potential for late structural valve deterioration and failure, depending upon the patient's longevity.  The patient specifically  requests that if the mitral valve must be replaced that it be done using a bioprosthetic valve.   The patient understands and accepts all potential risks of surgery including but not limited to risk of death, stroke or other neurologic complication, myocardial infarction, congestive heart failure, respiratory failure, renal failure, bleeding requiring transfusion and/or reexploration, arrhythmia, infection or other wound complications, pneumonia, pleural and/or pericardial effusion, pulmonary embolus, aortic dissection or other major vascular complication, or delayed complications related to valve repair or replacement including but not limited to structural valve deterioration and failure, thrombosis, embolization, endocarditis, or paravalvular leak.  Alternative surgical approaches have been discussed including a comparison between conventional sternotomy and minimally-invasive techniques.  The relative risks and benefits of each have been reviewed as they pertain to the patient's specific circumstances, and all of their questions have been addressed.  Specific risks potentially related to the minimally-invasive approach were discussed at length, including but not limited to risk of conversion to full or partial sternotomy, aortic dissection or other major vascular complication, unilateral acute lung injury or pulmonary edema, phrenic nerve dysfunction or paralysis, rib fracture, chronic pain, lung hernia, or lymphocele.   The relative risks and benefits of performing a maze procedure at the time of their surgery was discussed at length, including the expected likelihood of long term freedom from recurrent symptomatic atrial fibrillation and/or atrial flutter.  All of their questions have been answered.    I spent in excess of 30 minutes during the conduct of this office consultation and >50% of this time involved direct face-to-face encounter with the patient for counseling and/or coordination of their  care.    [name], MD 11/14/2015 4:27 PM

## 2015-11-15 ENCOUNTER — Encounter (HOSPITAL_COMMUNITY)
Admission: RE | Admit: 2015-11-15 | Discharge: 2015-11-15 | Disposition: A | Discharge status: Home / Self Care | Payer: Medicare Other | Source: Ambulatory Visit | Attending: Thoracic Surgery (Cardiothoracic Vascular Surgery) | Admitting: Thoracic Surgery (Cardiothoracic Vascular Surgery)

## 2015-11-15 ENCOUNTER — Ambulatory Visit (HOSPITAL_COMMUNITY)
Admission: RE | Admit: 2015-11-15 | Discharge: 2015-11-15 | Disposition: A | Discharge status: Home / Self Care | Payer: Medicare Other | Source: Ambulatory Visit | Attending: Thoracic Surgery (Cardiothoracic Vascular Surgery) | Admitting: Thoracic Surgery (Cardiothoracic Vascular Surgery)

## 2015-11-15 ENCOUNTER — Ambulatory Visit (HOSPITAL_BASED_OUTPATIENT_CLINIC_OR_DEPARTMENT_OTHER)
Admission: RE | Admit: 2015-11-15 | Discharge: 2015-11-15 | Disposition: A | Discharge status: Home / Self Care | Payer: Medicare Other | Source: Ambulatory Visit | Attending: Thoracic Surgery (Cardiothoracic Vascular Surgery) | Admitting: Thoracic Surgery (Cardiothoracic Vascular Surgery)

## 2015-11-15 ENCOUNTER — Encounter (HOSPITAL_COMMUNITY): Payer: Self-pay

## 2015-11-15 VITALS — BP 139/75 | HR 55 | Temp 97.6°F | Resp 18 | Wt 143.0 lb

## 2015-11-15 DIAGNOSIS — Z87891 Personal history of nicotine dependence: Secondary | ICD-10-CM | POA: Insufficient documentation

## 2015-11-15 DIAGNOSIS — I341 Nonrheumatic mitral (valve) prolapse: Secondary | ICD-10-CM | POA: Insufficient documentation

## 2015-11-15 DIAGNOSIS — I6522 Occlusion and stenosis of left carotid artery: Secondary | ICD-10-CM | POA: Insufficient documentation

## 2015-11-15 DIAGNOSIS — I4891 Unspecified atrial fibrillation: Secondary | ICD-10-CM | POA: Diagnosis not present

## 2015-11-15 DIAGNOSIS — I480 Paroxysmal atrial fibrillation: Secondary | ICD-10-CM | POA: Diagnosis not present

## 2015-11-15 DIAGNOSIS — I340 Nonrheumatic mitral (valve) insufficiency: Secondary | ICD-10-CM | POA: Insufficient documentation

## 2015-11-15 DIAGNOSIS — Z79899 Other long term (current) drug therapy: Secondary | ICD-10-CM | POA: Diagnosis not present

## 2015-11-15 DIAGNOSIS — Z01818 Encounter for other preprocedural examination: Secondary | ICD-10-CM | POA: Insufficient documentation

## 2015-11-15 DIAGNOSIS — Z01812 Encounter for preprocedural laboratory examination: Secondary | ICD-10-CM | POA: Diagnosis not present

## 2015-11-15 DIAGNOSIS — Z7901 Long term (current) use of anticoagulants: Secondary | ICD-10-CM | POA: Diagnosis not present

## 2015-11-15 DIAGNOSIS — Z0183 Encounter for blood typing: Secondary | ICD-10-CM | POA: Diagnosis not present

## 2015-11-15 DIAGNOSIS — R001 Bradycardia, unspecified: Secondary | ICD-10-CM | POA: Insufficient documentation

## 2015-11-15 HISTORY — DX: Peripheral vascular disease, unspecified: I73.9

## 2015-11-15 HISTORY — DX: Pneumonia, unspecified organism: J18.9

## 2015-11-15 LAB — URINALYSIS, ROUTINE W REFLEX MICROSCOPIC
Bilirubin Urine: NEGATIVE
Glucose, UA: NEGATIVE mg/dL
Hgb urine dipstick: NEGATIVE
Ketones, ur: NEGATIVE mg/dL
LEUKOCYTES UA: NEGATIVE
NITRITE: NEGATIVE
PH: 5.5 (ref 5.0–8.0)
Protein, ur: NEGATIVE mg/dL
SPECIFIC GRAVITY, URINE: 1.016 (ref 1.005–1.030)

## 2015-11-15 LAB — PULMONARY FUNCTION TEST
DL/VA % PRED: 67 %
DL/VA: 3.46 ml/min/mmHg/L
DLCO UNC: 17.2 ml/min/mmHg
DLCO unc % pred: 60 %
FEF 25-75 POST: 1.79 L/s
FEF 25-75 Pre: 2.13 L/sec
FEF2575-%Change-Post: -15 %
FEF2575-%PRED-PRE: 97 %
FEF2575-%Pred-Post: 81 %
FEV1-%Change-Post: -7 %
FEV1-%PRED-POST: 87 %
FEV1-%PRED-PRE: 94 %
FEV1-PRE: 2.48 L
FEV1-Post: 2.3 L
FEV1FVC-%Change-Post: 0 %
FEV1FVC-%PRED-PRE: 99 %
FEV6-%Change-Post: -6 %
FEV6-%PRED-POST: 91 %
FEV6-%PRED-PRE: 97 %
FEV6-POST: 3.02 L
FEV6-Pre: 3.23 L
FEV6FVC-%CHANGE-POST: 0 %
FEV6FVC-%PRED-POST: 104 %
FEV6FVC-%Pred-Pre: 103 %
FVC-%Change-Post: -6 %
FVC-%Pred-Post: 88 %
FVC-%Pred-Pre: 94 %
FVC-Post: 3.05 L
FVC-Pre: 3.27 L
POST FEV6/FVC RATIO: 100 %
PRE FEV1/FVC RATIO: 76 %
PRE FEV6/FVC RATIO: 100 %
Post FEV1/FVC ratio: 75 %
RV % pred: 102 %
RV: 2.34 L
TLC % PRED: 103 %
TLC: 5.7 L

## 2015-11-15 LAB — CBC
HEMATOCRIT: 40.8 % (ref 36.0–46.0)
Hemoglobin: 13.7 g/dL (ref 12.0–15.0)
MCH: 33 pg (ref 26.0–34.0)
MCHC: 33.6 g/dL (ref 30.0–36.0)
MCV: 98.3 fL (ref 78.0–100.0)
PLATELETS: 331 10*3/uL (ref 150–400)
RBC: 4.15 MIL/uL (ref 3.87–5.11)
RDW: 13.2 % (ref 11.5–15.5)
WBC: 5.8 10*3/uL (ref 4.0–10.5)

## 2015-11-15 LAB — BLOOD GAS, ARTERIAL
ACID-BASE DEFICIT: 4.3 mmol/L — AB (ref 0.0–2.0)
BICARBONATE: 18.7 meq/L — AB (ref 20.0–24.0)
FIO2: 0.21
O2 Saturation: 97.9 %
PCO2 ART: 26 mmHg — AB (ref 35.0–45.0)
PH ART: 7.471 — AB (ref 7.350–7.450)
PO2 ART: 99.7 mmHg (ref 80.0–100.0)
Patient temperature: 98.6
TCO2: 19.5 mmol/L (ref 0–100)

## 2015-11-15 LAB — SURGICAL PCR SCREEN
MRSA, PCR: NEGATIVE
STAPHYLOCOCCUS AUREUS: NEGATIVE

## 2015-11-15 LAB — ABO/RH: ABO/RH(D): O POS

## 2015-11-15 LAB — COMPREHENSIVE METABOLIC PANEL
ALT: 21 U/L (ref 14–54)
AST: 27 U/L (ref 15–41)
Albumin: 4.2 g/dL (ref 3.5–5.0)
Alkaline Phosphatase: 70 U/L (ref 38–126)
Anion gap: 12 (ref 5–15)
BILIRUBIN TOTAL: 0.7 mg/dL (ref 0.3–1.2)
BUN: 14 mg/dL (ref 6–20)
CHLORIDE: 109 mmol/L (ref 101–111)
CO2: 16 mmol/L — ABNORMAL LOW (ref 22–32)
Calcium: 9.7 mg/dL (ref 8.9–10.3)
Creatinine, Ser: 0.75 mg/dL (ref 0.44–1.00)
Glucose, Bld: 102 mg/dL — ABNORMAL HIGH (ref 65–99)
POTASSIUM: 3.7 mmol/L (ref 3.5–5.1)
Sodium: 137 mmol/L (ref 135–145)
TOTAL PROTEIN: 7.4 g/dL (ref 6.5–8.1)

## 2015-11-15 LAB — PROTIME-INR
INR: 1.04 (ref 0.00–1.49)
PROTHROMBIN TIME: 13.8 s (ref 11.6–15.2)

## 2015-11-15 LAB — APTT: aPTT: 32 seconds (ref 24–37)

## 2015-11-15 MED ORDER — ALBUTEROL SULFATE (2.5 MG/3ML) 0.083% IN NEBU
2.5000 mg | INHALATION_SOLUTION | Freq: Once | RESPIRATORY_TRACT | Status: AC
  Administered 2015-11-15: 2.5 mg via RESPIRATORY_TRACT

## 2015-11-15 NOTE — H&P (Signed)
[address]       [place] [address]             [phone]          CARDIOTHORACIC SURGERY HISTORY AND PHYSICAL EXAM  Referring Provider is Larey Dresser, MD PCP is [name], MD  Chief Complaint  Patient presents with  . Mitral Regurgitation    eval for MVR/MAZE...ECHO 10/04/15, TEE 10/21/15  . Atrial Fibrillation    HPI:  Patient is a 70 year old female with long-standing history of mitral valve prolapse and mitral regurgitation with been referred for surgical consultation to discuss treatment options for management of severe symptomatic primary mitral regurgitation with recurrent paroxysmal atrial fibrillation. The patient states that she was noted to have a heart murmur during her youth and has been followed intermittently ever since with known history of mitral valve prolapse. Several years ago she moved to [place]. In May 2012 she was hospitalized with shortness of breath and atypical chest discomfort. She ruled out for myocardial infarction but echocardiogram revealed bileaflet prolapse with at least moderate mitral regurgitation and normal left ventricular systolic function. She was referred to Dr. Aundra Dubin who performed transesophageal echocardiogram and exercise treadmill test. The patient was found to have moderate to severe mitral regurgitation but normal exercise tolerance without signs of symptomatic mitral regurgitation. She has been followed carefully ever since. The patient has remained physically active and exercises on a regular basis. In May 2016 the patient developed paroxysmal atrial fibrillation. She was started on Toprol-XL and Eliquis. Symptoms of intermittent palpitations have persisted ever since. Several months ago she began to experience shortness of breath with more strenuous activity. Follow-up echocardiogram performed 10/04/2015 revealed progression in the severity of mitral regurgitation with preserved left  ventricular systolic function. She was seen in follow-up by Dr. Benjamine Mola and subsequently underwent TEE and diagnostic cardiac catheterization on 10/21/2015. TEE confirmed the presence of bileaflet prolapse of the mitral valve with severe mitral regurgitation. Left ventricular size and systolic function remains normal. There was moderate left atrial enlargement. Catheterization revealed moderate nonobstructive coronary artery disease with normal pulmonary artery pressures. [facility] thoracic surgical consultation was requested.  The patient is married and lives locally in [place] with her husband. She has remained physically active all of her adult life. She enjoys playing tennis and other aerobic exercise activity. She typically goes to the gym nearly every day and frequently walks 5 miles at a time. Last 2 or 3 months she has begun to experience exertional shortness of breath. This typically only occurs with more strenuous activity. She has never had any resting shortness of breath, PND, orthopnea, or lower extremity edema. Episodes of shortness of breath associated with mild "discomfort" in her chest. She has frequent palpitations. She has never had any dizzy spells or syncope. This past week she has developed some mild nasal congestion without productive cough or fever. She suspects that she may have a "cold"  Patient returns to the office today for follow-up of stage D severe symptomatic primary mitral regurgitation with recurrent paroxysmal atrial fibrillation. She was originally seen in consultation on 11/04/2015 and she returns to the office today with tentative plans to proceed with elective mitral valve repair and Maze procedure next week. Approximately 10 days ago she was seen by her primary care physician with complaints of sinus congestion and nasal discharge. She was given a prescription for Augmentin which she has nearly completed. She states that her symptoms have resolved  completely and she  now feels back to her baseline. She denies any history of fevers or chills. Her nasal congestion has resolved. She has not had any significant cough. She reports no other problems or complaints.          Past Medical History  Diagnosis Date  . INSOMNIA, CHRONIC 03/29/2009  . Skin cancer of arm     Squamous  . MVP (mitral valve prolapse)   . Osteopenia 01/2014    T score -1.8 FRAX 8.8%/1.3%  . Heart murmur   . PONV (postoperative nausea and vomiting)   . Prepatellar bursitis     RT KNEE  . History of melanoma excision   . History of palpitations   . Mitral regurgitation     severe by TEE  . Paroxysmal atrial fibrillation ([place]) 10/18/2014    Past Surgical History  Procedure Laterality Date  . Appendectomy  1969  . Tonsillectomy  1980  . Abdominal hysterectomy  1980    WITH BSO/FIBROIDS  . Oophorectomy      BSO WITH ABDOMINAL HYSTERECTOMY  . Thyroid cyst excision  05-01-11    RIGHT  . Shoulder arthroscopy  2010    right  . Colonoscopy    . Diagnostic laparoscopy      expl  . Excision melanoma with sentinel lymph node biopsy Right 10/02/2013    Procedure: WIDE EXCISION MELANOMA RIGHT ARM WITH SENTINEL LYMPH NODE BIOPSY;  Surgeon: Shann Medal, MD;  Location: [facility];  Service: General;  Laterality: Right;  . Knee bursectomy Right 03/05/2014    Procedure: RIGHT KNEE BURSECTOMY;  Surgeon: Gearlean Alf, MD;  Location: [facility];  Service: Orthopedics;  Laterality: Right;  . Cardiac catheterization N/A 10/21/2015    Procedure: Right/Left Heart Cath and Coronary Angiography;  Surgeon: Larey Dresser, MD;  Location: [facility];  Service: Cardiovascular;  Laterality: N/A;  . Tee without cardioversion N/A 10/21/2015    Procedure: TRANSESOPHAGEAL ECHOCARDIOGRAM (TEE);  Surgeon: Larey Dresser, MD;  Location: [facility];  Service: Cardiovascular;  Laterality: N/A;    Family History  Problem Relation Age of Onset  . Diabetes type II Mother   . Alcohol  abuse Father   . Throat cancer Father   . Ovarian cancer Maternal Aunt   . Breast cancer Maternal Grandmother     Social History Social History  Substance Use Topics  . Smoking status: Former Smoker -- 1.00 packs/day for 20 years    Types: Cigarettes    Quit date: 08/11/1970  . Smokeless tobacco: Never Used  . Alcohol Use: 4.2 oz/week    7 Glasses of wine per week    Prior to Admission medications   Medication Sig Start Date End Date Taking? Authorizing Provider  acetaminophen (TYLENOL) 500 MG tablet Take 500 mg by mouth every 6 (six) hours as needed for mild pain or headache.   Yes Historical Provider, MD  amiodarone (PACERONE) 200 MG tablet Take 1 tablet (200 mg total) by mouth 2 (two) times daily. 11/04/15  Yes Rexene Alberts, MD  amoxicillin-clavulanate (AUGMENTIN) 875-125 MG tablet Take 1 tablet by mouth 2 (two) times daily. 11/06/15  Yes [name], MD  atorvastatin (LIPITOR) 20 MG tablet take 1 tablet by mouth daily 10/21/15  Yes Historical Provider, MD  ELIQUIS 5 MG TABS tablet TAKE 1 TABLET BY MOUTH TWICE DAILY 06/18/15  Yes Larey Dresser, MD  hydroxypropyl methylcellulose / hypromellose (ISOPTO TEARS / GONIOVISC) 2.5 % ophthalmic solution  Place 1 drop into both eyes as needed for dry eyes.   Yes Historical Provider, MD  nystatin (MYCOSTATIN/NYSTOP) 100000 UNIT/GM POWD Apply 1 application topically daily as needed. 03/07/15  Yes Historical Provider, MD  zolpidem (AMBIEN) 10 MG tablet TAKE 1 TABLET BY MOUTH AT BEDTIME AS NEEDED FOR SLEEP 11/05/15  Yes [name], MD  fluticasone (FLONASE) 50 MCG/ACT nasal spray Place 1 spray into both nostrils daily as needed for allergies or rhinitis.    Historical Provider, MD  metoprolol succinate (TOPROL-XL) 25 MG 24 hr tablet Take 1 tablet (25 mg total) by mouth 2 (two) times daily. 11/13/15   Larey Dresser, MD    Allergies  Allergen Reactions  . Inderal [Propranolol Hcl]     SEVERE LIP AND SKIN DRYNESS  . Tape     Very  sensitive skin-  . Latex Itching    Sensitive to latex      Review of Systems:  General:normal appetite, decreased energy, no weight gain, no weight loss, no fever Cardiac:+ chest pain with exertion, no chest pain at rest, +SOB with exertion, no resting SOB, no PND, no orthopnea, + palpitations, + arrhythmia, + atrial fibrillation, no LE edema, no dizzy spells, no syncope Respiratory:no shortness of breath, no home oxygen, no productive cough, no dry cough, + recent bronchitis, no wheezing, no hemoptysis, no asthma, no pain with inspiration or cough, no sleep apnea, no CPAP at night GI:no difficulty swallowing, no reflux, no frequent heartburn, no hiatal hernia, no abdominal pain, no constipation, no diarrhea, no hematochezia, no hematemesis, no melena GU:no dysuria, no frequency, no urinary tract infection, no hematuria, no kidney stones, no kidney disease Vascular:no pain suggestive of claudication, no pain in feet, + occasional leg cramps, no varicose veins, no DVT, no non-healing foot ulcer Neuro:no stroke, no TIA's, no seizures, no headaches, no temporary blindness one eye, no slurred speech, no peripheral neuropathy, no chronic pain, no instability of gait, no memory/cognitive dysfunction Musculoskeletal:no arthritis, no joint swelling, no myalgias, no difficulty walking, normal mobility  Skin:no rash, no itching, no skin infections, no pressure sores or ulcerations Psych:no anxiety, no depression, no nervousness, no unusual recent stress Eyes:no blurry vision, no floaters, no recent vision changes, + wears  glasses or contacts ENT:no hearing loss, no loose or painful teeth, no dentures, last saw dentist within the past 3 months Hematologic:+ easy bruising, no abnormal bleeding, no clotting disorder, no frequent epistaxis Endocrine:no diabetes, does not check CBG's at home   Physical Exam:  BP 130/80 mmHg  Pulse 70  Resp 20  Ht 5\' 7"  (1.702 m)  Wt 140 lb (63.504 kg)  BMI 21.92 kg/m2  SpO2 98%  LMP 06/22/1977 General: well-appearing HEENT:Unremarkable  Neck:no JVD, no bruits, no adenopathy  Chest:clear to auscultation, symmetrical breath sounds, no wheezes, no rhonchi  CV:RRR, grade III/VI holosystolic murmur best at LV apex Abdomen:soft, non-tender, no masses  Extremities:warm, well-perfused, pulses palpable, no LE edema Rectal/GUDeferred Neuro:Grossly non-focal and symmetrical throughout Skin:Clean and dry, no rashes, no breakdown   Diagnostic Tests:  Transthoracic Echocardiography  Patient: [name], [name] MR #: [id number] Study Date: 10/04/2015 Gender: F Age: 85 Height: 167.6 cm Weight: 66.7 kg BSA: 1.77 m^2 Pt. Status: Room:  SONOGRAPHER Blondell Reveal ATTENDING [name], M.D. ORDERING [name], M.D. REFERRING [name], M.D. PERFORMING [facility] Outpatient  cc:  ------------------------------------------------------------------- LV EF: 60% -  65%  ------------------------------------------------------------------- Indications: (I34.0).  ------------------------------------------------------------------- History: PMH: PAF. Acquired from the patient and from the patient&'s chart. Mitral  regurgitation. Mitral valve prolapse.  ------------------------------------------------------------------- Study Conclusions  - Left ventricle: The cavity size was normal. Systolic function was  normal. The estimated ejection fraction was in the range of 60%  to 65%. Wall motion was normal; there were no regional wall  motion abnormalities. Left ventricular diastolic function  parameters were normal. - Mitral valve: Moderate thickening, consistent with myxomatous  proliferation. Moderate, late systolicprolapse, involving the  anterior leaflet and the posterior leaflet. There was severe  regurgitation directed eccentrically and posteriorly. Severe  regurgitation is suggested by pulmonary vein systolic flow  reversal. - Left atrium: The atrium was mildly dilated. - Tricuspid valve: Mild prolapse. There was moderate regurgitation.  Impressions:  - There has been marked interval worsening of mitral regurgitation.  Transthoracic echocardiography. M-mode, complete 2D, spectral Doppler, and color Doppler. Birthdate: Patient birthdate: 14-Nov-1945. Age: Patient is 70 yr old. Sex: Gender: female. BMI: 23.7 kg/m^2. Blood pressure: 132/74 Patient status: Outpatient. Study date: Study date: 10/04/2015. Study time: 01:14 PM. Location: [facility]  -------------------------------------------------------------------  ------------------------------------------------------------------- Left ventricle: The cavity size was normal. Systolic function was normal. The estimated ejection fraction was in the range of 60% to 65%. Wall motion was normal; there were no regional wall motion abnormalities. The  transmitral flow pattern was normal. The deceleration time of the early transmitral flow velocity was normal. The pulmonary vein flow pattern was normal. The tissue Doppler parameters were normal. Left ventricular diastolic function parameters were normal.  ------------------------------------------------------------------- Aortic valve: Structurally normal valve. Trileaflet. Cusp separation was normal. Doppler: Transvalvular velocity was within the normal range. There was no stenosis. There was no regurgitation.  ------------------------------------------------------------------- Aorta: Aortic root: The aortic root was normal in size. Ascending aorta: The ascending aorta was normal in size.  ------------------------------------------------------------------- Mitral valve: Moderate thickening, consistent with myxomatous proliferation. Moderate, late systolicprolapse, involving the anterior leaflet and the posterior leaflet. Doppler: There was severe regurgitation directed eccentrically and posteriorly. Severe regurgitation is suggested by pulmonary vein systolic flow reversal. Peak gradient (D): 3 mm Hg.  ------------------------------------------------------------------- Left atrium: The atrium was mildly dilated.  ------------------------------------------------------------------- Right ventricle: The cavity size was normal. Systolic function was normal.  ------------------------------------------------------------------- Pulmonic valve: Poorly visualized. The valve appears to be grossly normal. Doppler: There was no significant regurgitation.  ------------------------------------------------------------------- Tricuspid valve: Leaflet separation was normal. Mild prolapse. Doppler: Transvalvular velocity was within the normal range. There was moderate  regurgitation.  ------------------------------------------------------------------- Pulmonary artery: Systolic pressure was within the normal range.  ------------------------------------------------------------------- Right atrium: The atrium was normal in size.  ------------------------------------------------------------------- Pericardium: There was no pericardial effusion.  ------------------------------------------------------------------- Measurements  Left ventricle Value Reference LV ID, ED, PLAX chordal (L) 41.2 mm 43 - 52 LV ID, ES, PLAX chordal 26.3 mm 23 - 38 LV fx shortening, PLAX chordal 36 % >=29 LV PW thickness, ED 10.91 mm --------- IVS/LV PW ratio, ED 0.98 <=1.3 Stroke volume, 2D 55 ml --------- Stroke volume/bsa, 2D 31 ml/m^2 --------- LV e&', lateral 8.97 cm/s --------- LV E/e&', lateral 9.86 --------- LV e&', medial 7.8 cm/s --------- LV E/e&', medial 11.33 --------- LV e&', average 8.39 cm/s --------- LV E/e&', average 10.54 ---------  Ventricular septum Value Reference IVS thickness, ED 10.68 mm ---------  LVOT Value Reference LVOT ID, S 19 mm --------- LVOT area 2.84 cm^2 --------- LVOT ID 19 mm --------- LVOT peak velocity, S 102 cm/s --------- LVOT mean velocity, S 60.7 cm/s --------- LVOT VTI, S 19.2 cm  --------- LVOT peak gradient, S 4 mm Hg --------- Stroke volume (SV), LVOT DP 54.4  ml --------- Stroke index (SV/bsa), LVOT DP 30.8 ml/m^2 ---------  Aorta Value Reference Aortic root ID, ED 35 mm --------- Ascending aorta ID, A-P, S 36 mm ---------  Left atrium Value Reference LA ID, A-P, ES 39 mm --------- LA ID/bsa, A-P (H) 2.21 cm/m^2 <=2.2 LA volume, S 62 ml --------- LA volume/bsa, S 35.1 ml/m^2 --------- LA volume, ES, 1-p A4C 42 ml --------- LA volume/bsa, ES, 1-p A4C 23.7 ml/m^2 --------- LA volume, ES, 1-p A2C 80 ml --------- LA volume/bsa, ES, 1-p A2C 45.2 ml/m^2 ---------  Mitral valve Value Reference Mitral E-wave peak velocity 88.4 cm/s --------- Mitral A-wave peak velocity 68.1 cm/s --------- Mitral deceleration time (H) 257 ms 150 - 230 Mitral peak gradient, D 3 mm Hg --------- Mitral E/A ratio, peak 1.3 ---------  Pulmonary arteries Value Reference PA pressure, S, DP 29 mm Hg <=30  Tricuspid valve Value Reference Tricuspid regurg peak velocity 255 cm/s --------- Tricuspid peak RV-RA gradient 26 mm Hg ---------  Systemic veins Value Reference Estimated CVP 3 mm Hg ---------  Right ventricle Value Reference RV pressure, S, DP 29 mm Hg <=30 RV  s&', lateral, S 19.4 cm/s ---------  Legend: (L) and (H) mark values outside specified reference range.  ------------------------------------------------------------------- Prepared and Electronically Authenticated by  Sanda Klein, MD 2017-04-14T15:45:18       Transesophageal Echocardiography  Patient: [name], [name] MR #: [id number] Study Date: 10/21/2015 Gender: F Age: 7 Height: 167.6 cm Weight: 64 kg BSA: 1.73 m^2 Pt. Status: Room:  ADMITTING [name], M.D. ATTENDING [name], M.D. ORDERING [name], M.D. PERFORMING [name], M.D. REFERRING [name], M.D. SONOGRAPHER Roseanna Rainbow  cc:  ------------------------------------------------------------------- LV EF: 60% - 65%  ------------------------------------------------------------------- Indications: Mitral valve prolapse 424.0.  ------------------------------------------------------------------- History: PMH: Palpitations. MR. Atrial fibrillation. Atrial flutter. Coronary artery disease. Angina pectoris.  ------------------------------------------------------------------- Study Conclusions  - Left ventricle: The cavity size was normal. Wall thickness was  normal. Systolic function was normal. The estimated ejection  fraction was in the range of 60% to 65%. Wall motion was normal;  there were no regional wall motion abnormalities. - Aortic valve: There was no stenosis. There was trivial  regurgitation. - Aorta: Normal caliber aorta, mild plaque descending thoracic  aorta. - Mitral valve: The mitral valve was thickened with bileaflet  prolapse. There was severe eccentric mitral regurgitation. ERO  was 0.36 cm^2 but there were two jets and only one jet (the  larger) was measured by PISA. Effective regurgitant orifice  (PISA): 0.36 cm^2. Regurgitant volume (PISA):  64 ml. - Left atrium: The atrium was moderately dilated. No evidence of  thrombus in the atrial cavity or appendage. - Right ventricle: The cavity size was normal. Systolic function  was normal. - Right atrium: No evidence of thrombus in the atrial cavity or  appendage. - Atrial septum: No defect or patent foramen ovale was identified.  Echo contrast study showed no right-to-left atrial level shunt,  at baseline or with provocation. - Tricuspid valve: Peak RV-RA gradient (S): 27 mm Hg.  Impressions:  - Severe mitral regurgitation from bileaflet mitral valve prolapse.  Diagnostic transesophageal echocardiography. 2D and color Doppler. Birthdate: Patient birthdate: 11-Feb-1946. Age: Patient is 70 yr old. Sex: Gender: female. BMI: 22.8 kg/m^2. Blood pressure:  152/98 Patient status: Inpatient. Study date: Study date: 10/21/2015. Study time: 08:02 AM. Location: Endoscopy.  -------------------------------------------------------------------  ------------------------------------------------------------------- Left ventricle: The cavity size was normal. Wall thickness was normal. Systolic function was normal. The estimated ejection fraction was in the range of 60%  to 65%. Wall motion was normal; there were no regional wall motion abnormalities.  ------------------------------------------------------------------- Aortic valve: Trileaflet. Doppler: There was no stenosis. There was trivial regurgitation.  ------------------------------------------------------------------- Aorta: Normal caliber aorta, mild plaque descending thoracic aorta.  ------------------------------------------------------------------- Mitral valve: The mitral valve was thickened with bileaflet prolapse. There was severe eccentric mitral regurgitation. ERO was 0.36 cm^2 but there were two jets and only one jet (the larger) was measured by PISA. Doppler: There was no  evidence for stenosis.  ------------------------------------------------------------------- Left atrium: The atrium was moderately dilated. No evidence of thrombus in the atrial cavity or appendage.  ------------------------------------------------------------------- Atrial septum: No defect or patent foramen ovale was identified. Echo contrast study showed no right-to-left atrial level shunt, at baseline or with provocation.  ------------------------------------------------------------------- Right ventricle: The cavity size was normal. Systolic function was normal.  ------------------------------------------------------------------- Pulmonic valve: Doppler: There was trivial regurgitation.  ------------------------------------------------------------------- Tricuspid valve: Doppler: There was mild regurgitation.  ------------------------------------------------------------------- Right atrium: The atrium was normal in size. No evidence of thrombus in the atrial cavity or appendage.  ------------------------------------------------------------------- Pericardium: There was no pericardial effusion.  ------------------------------------------------------------------- Post procedure conclusions Ascending Aorta:  - Normal caliber aorta, mild plaque descending thoracic aorta.  ------------------------------------------------------------------- Measurements  Mitral valve Value Mitral regurg VTI, PISA 179 cm Mitral ERO, PISA 0.36 cm^2 Mitral regurg volume, PISA 64 ml  Tricuspid valve Value Tricuspid regurg peak velocity 262 cm/s Tricuspid peak RV-RA gradient 27 mm Hg  Legend: (L) and (H) mark values outside specified reference range.  ------------------------------------------------------------------- Prepared and  Electronically Authenticated by  [name], M.D. 2017-05-01T15:22:16   CARDIAC CATHETERIZATION Procedures    Right/Left Heart Cath and Coronary Angiography    Conclusion    1. Normal left and right heart filling pressures.  2. Nonobstructive CAD.  3. Normal EF, mitral regurgitation does appear as marked by LV-gram as by TEE, likely due to eccentricity.     Technique and Indications    Procedure: Right Heart Cath, Left Heart Cath, Selective Coronary Angiography, LV angiography  Indication: Severe mitral regurgitation.   Procedural Details: The right groin and right radial area were prepped, draped, and anesthetized with 1% lidocaine. Using the modified Seldinger technique a 7 French sheath was placed in the right femoral vein. A Swan-Ganz catheter was used for the right heart catheterization. Standard protocol was followed for recording of right heart pressures and sampling of oxygen saturations. Fick cardiac output was calculated. The right radial artery was entered using modified Seldinger technique and a 6 French sheath was placed. The patient received 3 mg IA verapamil and weight-based IV heparin. Standard Judkins catheters were used for selective coronary angiography and left ventriculography. There were no immediate procedural complications. The patient was transferred to the post catheterization recovery area for further monitoring.  During this procedure the patient is administered a total of Versed 2 mg and Fentanyl 50 mg to achieve and maintain moderate conscious sedation. The patient's heart rate, blood pressure, and oxygen saturation are monitored continuously during the procedure. The period of conscious sedation is 39 minutes, of which I was present face-to-face 100% of this time.Estimated blood loss <50 mL.    Coronary Findings    Dominance: Right   Left Main  Short, no significant disease.     Left Anterior Descending  No significant  disease.     Left Circumflex  Large vessel. Ostial calcified plaque with positive remodeling. Only about 20% stenosis.     Right Coronary Artery  Proximal 40% stenosis.  Right Heart Pressures [facility] Procedural Findings: Hemodynamics (mmHg) RA mean 5 RV 23/6 PA 22/9 PCWP mean 11  Oxygen saturations: PA 70 AO 100  Cardiac Output (Fick) 3.89  Cardiac Index (Fick) 2.26    Wall Motion    EF 60-65%. 3+ mitral regurgitation.           Implants    No implant documentation for this case.    PACS Images    Show images for Cardiac catheterization     Link to Procedure Log    Procedure Log      Hemo Data       Most Recent Value   Fick Cardiac Output  3.89 L/min   Fick Cardiac Output Index  2.26 (L/min)/BSA   RA A Wave  5 mmHg   RA V Wave  7 mmHg   RA Mean  5 mmHg   RV Systolic Pressure  23 mmHg   RV Diastolic Pressure  1 mmHg   RV EDP  6 mmHg   PA Systolic Pressure  22 mmHg   PA Diastolic Pressure  9 mmHg   PA Mean  14 mmHg   PW A Wave  14 mmHg   PW V Wave  16 mmHg   PW Mean  11 mmHg   AO Systolic Pressure  123456 mmHg   AO Diastolic Pressure  61 mmHg   AO Mean  83 mmHg   LV Systolic Pressure  XX123456 mmHg   LV Diastolic Pressure  4 mmHg   LV EDP  10 mmHg   Arterial Occlusion Pressure Extended Systolic Pressure  XX123456 mmHg   Arterial Occlusion Pressure Extended Diastolic Pressure  67 mmHg   Arterial Occlusion Pressure Extended Mean Pressure  87 mmHg   Left Ventricular Apex Extended Systolic Pressure  123XX123 mmHg   Left Ventricular Apex Extended Diastolic Pressure  5 mmHg   Left Ventricular Apex Extended EDP Pressure  11 mmHg   QP/QS  1   TPVR Index  6.19 HRUI   TSVR Index  36.66 HRUI   PVR SVR Ratio  0.04   TPVR/TSVR Ratio  0.17    CT ANGIOGRAPHY CHEST,  ABDOMEN AND PELVIS  TECHNIQUE: Multidetector CT imaging through the chest, abdomen and pelvis was performed using the standard protocol during bolus administration of intravenous contrast. Multiplanar reconstructed images and MIPs were obtained and reviewed to evaluate the vascular anatomy.  CONTRAST: 100 mL of Isovue 370 intravenously.  COMPARISON: CT scan of November 30, 2013.  FINDINGS: CTA CHEST FINDINGS  No pneumothorax or pleural effusion is noted. 14 x 10 mm nodular density is noted posteriorly in left lower lobe which is not significantly changed compared to prior exam. Stable 15 x 14 mm left retro hilar density is noted as well which is unchanged. These most likely represent benign lesions given the lack of change. Minimal bilateral posterior basilar subsegmental atelectasis is noted. There is no evidence of thoracic aortic dissection or aneurysm. Great vessels are widely patent at the origins, although most of the vessels are not included in the field-of-view on this study and therefore cannot be evaluated. No mediastinal mass or adenopathy is noted. Mild coronary artery calcifications are noted. No significant osseous abnormality is noted.  Review of the MIP images confirms the above findings.  CTA ABDOMEN AND PELVIS FINDINGS  No gallstones are noted. The liver, spleen and pancreas unremarkable. Adrenal glands appear normal. No hydronephrosis or renal obstruction is noted. 7.4 cm cyst is seen involving the lower pole of  the left kidney with peripheral calcification. Atherosclerosis of abdominal aorta is noted without aneurysm or dissection. The mesenteric and renal arteries are widely patent without significant stenosis. Iliac arteries are widely patent without significant stenosis. There is no evidence of bowel obstruction. No abnormal fluid collection is noted. Urinary bladder appears normal. Status post hysterectomy and appendectomy. 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  Review of the MIP images confirms the above findings.  IMPRESSION: No evidence of thoracic or abdominal aortic aneurysm or dissection.  Mild coronary artery calcifications are noted suggesting coronary artery disease.  Stable left lower lobe nodular densities are noted compared to prior exam. These can be considered benign at this point with no further follow-up required.  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   Electronically Signed  By: Marijo Conception, M.D.  On: 11/04/2015 18:42          Impression:  Patient has stage D severe symptomatic primary mitral regurgitation with recurrent paroxysmal atrial fibrillation. She presents with recent onset symptoms of exertional shortness of breath consistent with chronic diastolic congestive heart failure, New York Heart Association functional class 1-2. 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 There is elongation of multiple primary chordae tendoneae without evidence for ruptured cords or flail segments. There are too broad jets of regurgitation directed centrally across the dilated left atrium. The leaflets are moderately thickened and mildly redundant. Left ventricular size and systolic function appears normal. Diagnostic cardiac catheterization a double for moderate nonobstructive coronary artery disease with long segment diffuse 40% proximal stenosis of the right coronary artery but otherwise no significant flow limiting disease. There is codominant coronary circulation with a relatively large left circumflex coronary artery. Pulmonary artery pressures were normal. I agree the patient would likely best be treated with elective mitral valve repair and Maze procedure. She appears to be a good candidate for minimally invasive approach or  surgery.   Plan:  The patient and her husband were again counseled at length regarding the indications, risks and potential benefits of mitral valve repair. The rationale for elective surgery has been explained, including a comparison between surgery and continued medical therapy with close follow-up. The likelihood of successful and durable valve repair has been discussed with particular reference to the findings of their recent echocardiogram. Based upon these findings and previous experience, I have quoted them a greater than 80 percent likelihood of successful valve repair. In the unlikely event that their valve cannot be successfully repaired, we discussed the possibility of replacing the mitral valve using a mechanical prosthesis with the attendant need for long-term anticoagulation versus the alternative of replacing it using a bioprosthetic tissue valve with its potential for late structural valve deterioration and failure, depending upon the patient's longevity. The patient specifically requests that if the mitral valve must be replaced that it be done using a bioprosthetic valve. The patient understands and accepts all potential risks of surgery including but not limited to risk of death, stroke or other neurologic complication, myocardial infarction, congestive heart failure, respiratory failure, renal failure, bleeding requiring transfusion and/or reexploration, arrhythmia, infection or other wound complications, pneumonia, pleural and/or pericardial effusion, pulmonary embolus, aortic dissection or other major vascular complication, or delayed complications related to valve repair or replacement including but not limited to structural valve deterioration and failure, thrombosis, embolization, endocarditis, or paravalvular leak. Alternative surgical approaches have  been discussed including a comparison between conventional sternotomy and minimally-invasive techniques. The relative risks and  benefits of each have been reviewed as they pertain to the patient's specific circumstances, and all of their questions have been addressed. Specific risks potentially related to the minimally-invasive approach were discussed at length, including but not limited to risk of conversion to full or partial sternotomy, aortic dissection or other major vascular complication, unilateral acute lung injury or pulmonary edema, phrenic nerve dysfunction or paralysis, rib fracture, chronic pain, lung hernia, or lymphocele. The relative risks and benefits of performing a maze procedure at the time of their surgery was discussed at length, including the expected likelihood of long term freedom from recurrent symptomatic atrial fibrillation and/or atrial flutter. All of their questions have been answered.    I spent in excess of 30 minutes during the conduct of this office consultation and >50% of this time involved direct face-to-face encounter with the patient for counseling and/or coordination of their care.    [name], MD 11/14/2015 4:27 PM

## 2015-11-15 NOTE — Pre-Procedure Instructions (Addendum)
[name]  11/15/2015      [id number] - [place], [address] AT [facility] OF [address] [place] [address] Phone: [phone] Fax: [phone]    Your procedure is scheduled on 11/19/15.  Report to [facility] Admitting at 530 A.M.  Call this number if you have problems the morning of surgery:  [phone]   Remember:  Do not eat food or drink liquids after midnight.  Take these medicines the morning of surgery with A SIP OF WATER , metoprolol(troprol)  STOP all herbel meds, nsaids (aleve,naproxen,advil,ibuprofen) stating now including vitamins,   Stop eliquis 11/13/15 per dr Roxy Manns   Do not wear jewelry, make-up or nail polish.  Do not wear lotions, powders, or perfumes.  You may wear deodorant.  Do not shave 48 hours prior to surgery.  Men may shave face and neck.  Do not bring valuables to the hospital.  [facility] is not responsible for any belongings or valuables.  Contacts, dentures or bridgework may not be worn into surgery.  Leave your suitcase in the car.  After surgery it may be brought to your room.  For patients admitted to the hospital, discharge time will be determined by your treatment team.  Patients discharged the day of surgery will not be allowed to drive home.   Name and phone number of your driver:    Special instructions:   Special Instructions: [name] - Preparing for Surgery  Before surgery, you can play an important role.  Because skin is not sterile, your skin needs to be as free of germs as possible.  You can reduce the number of germs on you skin by washing with CHG (chlorahexidine gluconate) soap before surgery.  CHG is an antiseptic cleaner which kills germs and bonds with the skin to continue killing germs even after washing.  Please DO NOT use if you have an allergy to CHG or antibacterial soaps.  If your skin becomes reddened/irritated stop using the CHG and  inform your nurse when you arrive at Short Stay.  Do not shave (including legs and underarms) for at least 48 hours prior to the first CHG shower.  You may shave your face.  Please follow these instructions carefully:   1.  Shower with CHG Soap the night before surgery and the morning of Surgery.  2.  If you choose to wash your hair, wash your hair first as usual with your normal shampoo.  3.  After you shampoo, rinse your hair and body thoroughly to remove the Shampoo.  4.  Use CHG as you would any other liquid soap.  You can apply chg directly  to the skin and wash gently with scrungie or a clean washcloth.  5.  Apply the CHG Soap to your body ONLY FROM THE NECK DOWN.  Do not use on open wounds or open sores.  Avoid contact with your eyes ears, mouth and genitals (private parts).  Wash genitals (private parts)       with your normal soap.  6.  Wash thoroughly, paying special attention to the area where your surgery will be performed.  7.  Thoroughly rinse your body with warm water from the neck down.  8.  DO NOT shower/wash with your normal soap after using and rinsing off the CHG Soap.  9.  Pat yourself dry with a clean towel.  10.  Wear clean pajamas.            11.  Place clean sheets on your bed the night of your first shower and do not sleep with pets.  Day of Surgery  Do not apply any lotions/deodorants the morning of surgery.  Please wear clean clothes to the hospital/surgery center.  Please read over the following fact sheets that you were given. Pain Booklet, MRSA Information and Surgical Site Infection Prevention

## 2015-11-15 NOTE — Progress Notes (Signed)
Pre-op Cardiac Surgery  Carotid Findings:  Findings consistent with 1- 39 percent stenosis involving the left internal carotid artery. No evidence stenosis on right ICA.  Upper Extremity Right Left  Brachial Pressures 126 Triphasic 129 Triphasic  Radial Waveforms Triphasic Triphasic  Ulnar Waveforms Triphasic Triphasic  Palmar Arch (Allen's Test) Normal normal   Findings:   Pal[place] Arch evaluation-Doppler waveforms remained normal with both radial and ulnar compressions bilaterally.

## 2015-11-16 ENCOUNTER — Encounter (HOSPITAL_COMMUNITY): Payer: Self-pay | Admitting: Certified Registered Nurse Anesthetist

## 2015-11-16 LAB — HEMOGLOBIN A1C
Hgb A1c MFr Bld: 5.6 % (ref 4.8–5.6)
Mean Plasma Glucose: 114 mg/dL

## 2015-11-17 MED ORDER — POTASSIUM CHLORIDE 2 MEQ/ML IV SOLN
80.0000 meq | INTRAVENOUS | Status: DC
  Filled 2015-11-17: qty 40

## 2015-11-17 MED ORDER — VANCOMYCIN HCL 10 G IV SOLR
1250.0000 mg | INTRAVENOUS | Status: AC
  Administered 2015-11-19: 1250 mg via INTRAVENOUS
  Filled 2015-11-17: qty 1250

## 2015-11-17 MED ORDER — DEXTROSE 5 % IV SOLN
1.5000 g | INTRAVENOUS | Status: AC
  Administered 2015-11-19: .75 g via INTRAVENOUS
  Administered 2015-11-19: 1.5 g via INTRAVENOUS
  Filled 2015-11-17 (×2): qty 1.5

## 2015-11-17 MED ORDER — PHENYLEPHRINE HCL 10 MG/ML IJ SOLN
30.0000 ug/min | INTRAVENOUS | Status: DC
  Filled 2015-11-17: qty 2

## 2015-11-17 MED ORDER — SODIUM CHLORIDE 0.9 % IV SOLN
INTRAVENOUS | Status: AC
  Administered 2015-11-19: 1 [IU]/h via INTRAVENOUS
  Filled 2015-11-17: qty 2.5

## 2015-11-17 MED ORDER — DOPAMINE-DEXTROSE 3.2-5 MG/ML-% IV SOLN
0.0000 ug/kg/min | INTRAVENOUS | Status: DC
  Filled 2015-11-17: qty 250

## 2015-11-17 MED ORDER — NITROGLYCERIN IN D5W 200-5 MCG/ML-% IV SOLN
2.0000 ug/min | INTRAVENOUS | Status: AC
  Administered 2015-11-19: 5 ug/min via INTRAVENOUS
  Filled 2015-11-17: qty 250

## 2015-11-17 MED ORDER — METOPROLOL TARTRATE 12.5 MG HALF TABLET: 12.5000 mg | ORAL_TABLET | Freq: Once | ORAL | Status: DC

## 2015-11-17 MED ORDER — MAGNESIUM SULFATE 50 % IJ SOLN
40.0000 meq | INTRAMUSCULAR | Status: DC
  Filled 2015-11-17: qty 10

## 2015-11-17 MED ORDER — CHLORHEXIDINE GLUCONATE 0.12 % MT SOLN
15.0000 mL | Freq: Once | OROMUCOSAL | Status: AC
  Administered 2015-11-19: 15 mL via OROMUCOSAL
  Filled 2015-11-17: qty 15

## 2015-11-17 MED ORDER — DEXMEDETOMIDINE HCL IN NACL 400 MCG/100ML IV SOLN
0.1000 ug/kg/h | INTRAVENOUS | Status: AC
  Administered 2015-11-19: .3 ug/kg/h via INTRAVENOUS
  Filled 2015-11-17: qty 100

## 2015-11-17 MED ORDER — EPINEPHRINE HCL 1 MG/ML IJ SOLN
0.0000 ug/min | INTRAVENOUS | Status: DC
  Filled 2015-11-17: qty 4

## 2015-11-17 MED ORDER — AMINOCAPROIC ACID 250 MG/ML IV SOLN
INTRAVENOUS | Status: AC
  Administered 2015-11-19: 69.8 mL/h via INTRAVENOUS
  Filled 2015-11-17: qty 40

## 2015-11-17 MED ORDER — PAPAVERINE HCL 30 MG/ML IJ SOLN
INTRAMUSCULAR | Status: DC
  Filled 2015-11-17: qty 2.5

## 2015-11-17 MED ORDER — VANCOMYCIN HCL 1000 MG IV SOLR
INTRAVENOUS | Status: AC
  Administered 2015-11-19: 1000 mL
  Filled 2015-11-17: qty 1000

## 2015-11-17 MED ORDER — HEPARIN SODIUM (PORCINE) 1000 UNIT/ML IJ SOLN
INTRAMUSCULAR | Status: DC
  Filled 2015-11-17: qty 30

## 2015-11-17 MED ORDER — DEXTROSE 5 % IV SOLN
750.0000 mg | INTRAVENOUS | Status: DC
  Filled 2015-11-17: qty 750

## 2015-11-18 NOTE — Anesthesia Preprocedure Evaluation (Addendum)
Anesthesia Evaluation  Patient identified by MRN, date of birth, ID band Patient awake    Reviewed: Allergy & Precautions, NPO status , Patient's Chart, lab work & pertinent test results, reviewed documented beta blocker date and time   History of Anesthesia Complications (+) PONV and history of anesthetic complications  Airway Mallampati: II  TM Distance: >3 FB Neck ROM: Full    Dental  (+) Teeth Intact, Dental Advisory Given   Pulmonary former smoker,    breath sounds clear to auscultation       Cardiovascular + Peripheral Vascular Disease  + Valvular Problems/Murmurs MVP  Rhythm:Regular Rate:Normal     Neuro/Psych PSYCHIATRIC DISORDERS Anxiety negative neurological ROS     GI/Hepatic negative GI ROS, Neg liver ROS,   Endo/Other  negative endocrine ROS  Renal/GU negative Renal ROS  negative genitourinary   Musculoskeletal  (+) Arthritis ,   Abdominal Normal abdominal exam  (+)   Peds negative pediatric ROS (+)  Hematology negative hematology ROS (+)   Anesthesia Other Findings   Reproductive/Obstetrics negative OB ROS                            Lab Results  Component Value Date   WBC 5.8 11/15/2015   HGB 13.7 11/15/2015   HCT 40.8 11/15/2015   MCV 98.3 11/15/2015   PLT 331 11/15/2015   Lab Results  Component Value Date   CREATININE 0.75 11/15/2015   BUN 14 11/15/2015   NA 137 11/15/2015   K 3.7 11/15/2015   CL 109 11/15/2015   CO2 16* 11/15/2015   Lab Results  Component Value Date   INR 1.04 11/15/2015   INR 0.98 10/09/2015   INR 0.88 04/01/2011   10/2015 EKG: Sb, h/o Afib  10/2015 Echo - Left ventricle: The cavity size was normal. Wall thickness was normal. Systolic function was normal. The estimated ejection fraction was in the range of 60% to 65%. Wall motion was normal; there were no regional wall motion abnormalities. - Aortic valve: There was no stenosis. There  was trivial regurgitation. - Aorta: Normal caliber aorta, mild plaque descending thoracic aorta. - Mitral valve: The mitral valve was thickened with bileaflet prolapse. There was severe eccentric mitral regurgitation. ERO was 0.36 cm^2 but there were two jets and only one jet (the larger) was measured by PISA. Effective regurgitant orifice (PISA): 0.36 cm^2. Regurgitant volume (PISA): 64 ml. - Left atrium: The atrium was moderately dilated. No evidence of thrombus in the atrial cavity or appendage. - Right ventricle: The cavity size was normal. Systolic function was normal. - Right atrium: No evidence of thrombus in the atrial cavity or appendage. - Atrial septum: No defect or patent foramen ovale was identified. Echo contrast study showed no right-to-left atrial level shunt, at baseline or with provocation. - Tricuspid valve: Peak RV-RA gradient (S): 27 mm Hg.   Anesthesia Physical Anesthesia Plan  ASA: IV  Anesthesia Plan: General   Post-op Pain Management:    Induction: Intravenous  Airway Management Planned: Oral ETT  Additional Equipment: Arterial line, CVP, PA Cath and TEE  Intra-op Plan:   Post-operative Plan: Post-operative intubation/ventilation  Informed Consent: I have reviewed the patients History and Physical, chart, labs and discussed the procedure including the risks, benefits and alternatives for the proposed anesthesia with the patient or authorized representative who has indicated his/her understanding and acceptance.   Dental advisory given  Plan Discussed with: CRNA  Anesthesia Plan Comments:  Anesthesia Quick Evaluation  

## 2015-11-19 ENCOUNTER — Inpatient Hospital Stay (HOSPITAL_COMMUNITY): Payer: Medicare Other

## 2015-11-19 ENCOUNTER — Inpatient Hospital Stay (HOSPITAL_COMMUNITY): Payer: Medicare Other | Admitting: Certified Registered Nurse Anesthetist

## 2015-11-19 ENCOUNTER — Encounter (HOSPITAL_COMMUNITY): Payer: Self-pay | Admitting: *Deleted

## 2015-11-19 ENCOUNTER — Encounter (HOSPITAL_COMMUNITY)
Admission: RE | Disposition: A | Discharge status: Home / Self Care | Payer: Self-pay | Source: Ambulatory Visit | Attending: Thoracic Surgery (Cardiothoracic Vascular Surgery)

## 2015-11-19 ENCOUNTER — Inpatient Hospital Stay (HOSPITAL_COMMUNITY)
Admission: RE | Admit: 2015-11-19 | Discharge: 2015-11-25 | DRG: 220 | Disposition: A | Discharge status: Home / Self Care | Payer: Medicare Other | Source: Ambulatory Visit | Attending: Thoracic Surgery (Cardiothoracic Vascular Surgery) | Admitting: Thoracic Surgery (Cardiothoracic Vascular Surgery)

## 2015-11-19 DIAGNOSIS — Z91040 Latex allergy status: Secondary | ICD-10-CM | POA: Diagnosis not present

## 2015-11-19 DIAGNOSIS — R443 Hallucinations, unspecified: Secondary | ICD-10-CM | POA: Diagnosis not present

## 2015-11-19 DIAGNOSIS — I341 Nonrheumatic mitral (valve) prolapse: Secondary | ICD-10-CM | POA: Diagnosis present

## 2015-11-19 DIAGNOSIS — R001 Bradycardia, unspecified: Secondary | ICD-10-CM | POA: Diagnosis not present

## 2015-11-19 DIAGNOSIS — J9811 Atelectasis: Secondary | ICD-10-CM | POA: Diagnosis not present

## 2015-11-19 DIAGNOSIS — F5104 Psychophysiologic insomnia: Secondary | ICD-10-CM | POA: Diagnosis present

## 2015-11-19 DIAGNOSIS — Z87891 Personal history of nicotine dependence: Secondary | ICD-10-CM | POA: Diagnosis not present

## 2015-11-19 DIAGNOSIS — Z888 Allergy status to other drugs, medicaments and biological substances status: Secondary | ICD-10-CM

## 2015-11-19 DIAGNOSIS — I480 Paroxysmal atrial fibrillation: Secondary | ICD-10-CM | POA: Diagnosis present

## 2015-11-19 DIAGNOSIS — K5900 Constipation, unspecified: Secondary | ICD-10-CM | POA: Diagnosis not present

## 2015-11-19 DIAGNOSIS — E876 Hypokalemia: Secondary | ICD-10-CM | POA: Diagnosis present

## 2015-11-19 DIAGNOSIS — Z79899 Other long term (current) drug therapy: Secondary | ICD-10-CM | POA: Diagnosis not present

## 2015-11-19 DIAGNOSIS — J90 Pleural effusion, not elsewhere classified: Secondary | ICD-10-CM | POA: Diagnosis not present

## 2015-11-19 DIAGNOSIS — Z9889 Other specified postprocedural states: Secondary | ICD-10-CM

## 2015-11-19 DIAGNOSIS — Z85820 Personal history of malignant melanoma of skin: Secondary | ICD-10-CM | POA: Diagnosis not present

## 2015-11-19 DIAGNOSIS — F419 Anxiety disorder, unspecified: Secondary | ICD-10-CM | POA: Diagnosis present

## 2015-11-19 DIAGNOSIS — I2510 Atherosclerotic heart disease of native coronary artery without angina pectoris: Secondary | ICD-10-CM | POA: Diagnosis present

## 2015-11-19 DIAGNOSIS — Z952 Presence of prosthetic heart valve: Secondary | ICD-10-CM | POA: Diagnosis not present

## 2015-11-19 DIAGNOSIS — Z4682 Encounter for fitting and adjustment of non-vascular catheter: Secondary | ICD-10-CM | POA: Diagnosis not present

## 2015-11-19 DIAGNOSIS — I739 Peripheral vascular disease, unspecified: Secondary | ICD-10-CM | POA: Diagnosis not present

## 2015-11-19 DIAGNOSIS — J939 Pneumothorax, unspecified: Secondary | ICD-10-CM

## 2015-11-19 DIAGNOSIS — D696 Thrombocytopenia, unspecified: Secondary | ICD-10-CM | POA: Diagnosis not present

## 2015-11-19 DIAGNOSIS — I4891 Unspecified atrial fibrillation: Secondary | ICD-10-CM | POA: Diagnosis not present

## 2015-11-19 DIAGNOSIS — I340 Nonrheumatic mitral (valve) insufficiency: Principal | ICD-10-CM | POA: Diagnosis present

## 2015-11-19 DIAGNOSIS — Z7901 Long term (current) use of anticoagulants: Secondary | ICD-10-CM | POA: Diagnosis not present

## 2015-11-19 DIAGNOSIS — I5032 Chronic diastolic (congestive) heart failure: Secondary | ICD-10-CM | POA: Diagnosis present

## 2015-11-19 DIAGNOSIS — Z8679 Personal history of other diseases of the circulatory system: Secondary | ICD-10-CM

## 2015-11-19 DIAGNOSIS — I110 Hypertensive heart disease with heart failure: Secondary | ICD-10-CM | POA: Diagnosis not present

## 2015-11-19 DIAGNOSIS — Z91048 Other nonmedicinal substance allergy status: Secondary | ICD-10-CM | POA: Diagnosis not present

## 2015-11-19 DIAGNOSIS — D62 Acute posthemorrhagic anemia: Secondary | ICD-10-CM | POA: Diagnosis not present

## 2015-11-19 HISTORY — DX: Other specified postprocedural states: Z98.890

## 2015-11-19 HISTORY — PX: TEE WITHOUT CARDIOVERSION: SHX5443

## 2015-11-19 HISTORY — PX: CLIPPING OF ATRIAL APPENDAGE: SHX5773

## 2015-11-19 HISTORY — DX: Personal history of other diseases of the circulatory system: Z86.79

## 2015-11-19 HISTORY — PX: MINIMALLY INVASIVE MAZE PROCEDURE: SHX6244

## 2015-11-19 HISTORY — PX: MITRAL VALVE REPAIR: SHX2039

## 2015-11-19 LAB — POCT I-STAT 3, ART BLOOD GAS (G3+)
ACID-BASE DEFICIT: 2 mmol/L (ref 0.0–2.0)
ACID-BASE DEFICIT: 5 mmol/L — AB (ref 0.0–2.0)
ACID-BASE DEFICIT: 5 mmol/L — AB (ref 0.0–2.0)
ACID-BASE DEFICIT: 6 mmol/L — AB (ref 0.0–2.0)
ACID-BASE DEFICIT: 6 mmol/L — AB (ref 0.0–2.0)
Acid-base deficit: 4 mmol/L — ABNORMAL HIGH (ref 0.0–2.0)
Acid-base deficit: 5 mmol/L — ABNORMAL HIGH (ref 0.0–2.0)
BICARBONATE: 22.2 meq/L (ref 20.0–24.0)
BICARBONATE: 28 meq/L — AB (ref 20.0–24.0)
BICARBONATE: 20.2 meq/L (ref 20.0–24.0)
BICARBONATE: 20.4 meq/L (ref 20.0–24.0)
BICARBONATE: 19.8 meq/L — AB (ref 20.0–24.0)
Bicarbonate: 22.1 mEq/L (ref 20.0–24.0)
Bicarbonate: 19.8 mEq/L — ABNORMAL LOW (ref 20.0–24.0)
Bicarbonate: 21.2 mEq/L (ref 20.0–24.0)
O2 SAT: 100 %
O2 SAT: 100 %
O2 SAT: 100 %
O2 SAT: 98 %
O2 SAT: 99 %
O2 SAT: 99 %
O2 Saturation: 99 %
O2 Saturation: 98 %
PCO2 ART: 42.8 mmHg (ref 35.0–45.0)
PCO2 ART: 34.1 mmHg — AB (ref 35.0–45.0)
PCO2 ART: 43.4 mmHg (ref 35.0–45.0)
PCO2 ART: 40.4 mmHg (ref 35.0–45.0)
PCO2 ART: 45 mmHg (ref 35.0–45.0)
PH ART: 7.32 — AB (ref 7.350–7.450)
PH ART: 7.203 — AB (ref 7.350–7.450)
PH ART: 7.387 (ref 7.350–7.450)
PH ART: 7.277 — AB (ref 7.350–7.450)
PH ART: 7.298 — AB (ref 7.350–7.450)
PH ART: 7.282 — AB (ref 7.350–7.450)
PO2 ART: 457 mmHg — AB (ref 80.0–100.0)
PO2 ART: 408 mmHg — AB (ref 80.0–100.0)
PO2 ART: 139 mmHg — AB (ref 80.0–100.0)
PO2 ART: 120 mmHg — AB (ref 80.0–100.0)
PO2 ART: 163 mmHg — AB (ref 80.0–100.0)
Patient temperature: 35.8
Patient temperature: 36.4
Patient temperature: 36.9
Patient temperature: 37.4
TCO2: 23 mmol/L (ref 0–100)
TCO2: 23 mmol/L (ref 0–100)
TCO2: 30 mmol/L (ref 0–100)
TCO2: 21 mmol/L (ref 0–100)
TCO2: 21 mmol/L (ref 0–100)
TCO2: 22 mmol/L (ref 0–100)
TCO2: 21 mmol/L (ref 0–100)
TCO2: 22 mmol/L (ref 0–100)
pCO2 arterial: 71.1 mmHg (ref 35.0–45.0)
pCO2 arterial: 38.6 mmHg (ref 35.0–45.0)
pCO2 arterial: 32.5 mmHg — ABNORMAL LOW (ref 35.0–45.0)
pH, Arterial: 7.423 (ref 7.350–7.450)
pH, Arterial: 7.327 — ABNORMAL LOW (ref 7.350–7.450)
pO2, Arterial: 579 mmHg — ABNORMAL HIGH (ref 80.0–100.0)
pO2, Arterial: 105 mmHg — ABNORMAL HIGH (ref 80.0–100.0)
pO2, Arterial: 139 mmHg — ABNORMAL HIGH (ref 80.0–100.0)

## 2015-11-19 LAB — POCT I-STAT, CHEM 8
BUN: 13 mg/dL (ref 6–20)
BUN: 12 mg/dL (ref 6–20)
BUN: 9 mg/dL (ref 6–20)
BUN: 11 mg/dL (ref 6–20)
BUN: 11 mg/dL (ref 6–20)
BUN: 11 mg/dL (ref 6–20)
BUN: 9 mg/dL (ref 6–20)
BUN: 11 mg/dL (ref 6–20)
CALCIUM ION: 1.25 mmol/L (ref 1.13–1.30)
CALCIUM ION: 1.14 mmol/L (ref 1.13–1.30)
CALCIUM ION: 1.06 mmol/L — AB (ref 1.13–1.30)
CHLORIDE: 106 mmol/L (ref 101–111)
CHLORIDE: 103 mmol/L (ref 101–111)
CHLORIDE: 105 mmol/L (ref 101–111)
CHLORIDE: 105 mmol/L (ref 101–111)
CHLORIDE: 102 mmol/L (ref 101–111)
CHLORIDE: 104 mmol/L (ref 101–111)
CHLORIDE: 109 mmol/L (ref 101–111)
CREATININE: 0.3 mg/dL — AB (ref 0.44–1.00)
CREATININE: 0.3 mg/dL — AB (ref 0.44–1.00)
CREATININE: 0.5 mg/dL (ref 0.44–1.00)
Calcium, Ion: 1.3 mmol/L (ref 1.13–1.30)
Calcium, Ion: 0.96 mmol/L — ABNORMAL LOW (ref 1.13–1.30)
Calcium, Ion: 1.05 mmol/L — ABNORMAL LOW (ref 1.13–1.30)
Calcium, Ion: 1.04 mmol/L — ABNORMAL LOW (ref 1.13–1.30)
Calcium, Ion: 1.07 mmol/L — ABNORMAL LOW (ref 1.13–1.30)
Chloride: 106 mmol/L (ref 101–111)
Creatinine, Ser: 0.5 mg/dL (ref 0.44–1.00)
Creatinine, Ser: 0.5 mg/dL (ref 0.44–1.00)
Creatinine, Ser: 0.3 mg/dL — ABNORMAL LOW (ref 0.44–1.00)
Creatinine, Ser: 0.3 mg/dL — ABNORMAL LOW (ref 0.44–1.00)
Creatinine, Ser: 0.5 mg/dL (ref 0.44–1.00)
GLUCOSE: 136 mg/dL — AB (ref 65–99)
GLUCOSE: 138 mg/dL — AB (ref 65–99)
GLUCOSE: 130 mg/dL — AB (ref 65–99)
Glucose, Bld: 106 mg/dL — ABNORMAL HIGH (ref 65–99)
Glucose, Bld: 102 mg/dL — ABNORMAL HIGH (ref 65–99)
Glucose, Bld: 122 mg/dL — ABNORMAL HIGH (ref 65–99)
Glucose, Bld: 126 mg/dL — ABNORMAL HIGH (ref 65–99)
Glucose, Bld: 126 mg/dL — ABNORMAL HIGH (ref 65–99)
HCT: 32 % — ABNORMAL LOW (ref 36.0–46.0)
HCT: 26 % — ABNORMAL LOW (ref 36.0–46.0)
HCT: 24 % — ABNORMAL LOW (ref 36.0–46.0)
HCT: 26 % — ABNORMAL LOW (ref 36.0–46.0)
HEMATOCRIT: 35 % — AB (ref 36.0–46.0)
HEMATOCRIT: 26 % — AB (ref 36.0–46.0)
HEMATOCRIT: 23 % — AB (ref 36.0–46.0)
HEMATOCRIT: 29 % — AB (ref 36.0–46.0)
HEMOGLOBIN: 9.9 g/dL — AB (ref 12.0–15.0)
Hemoglobin: 11.9 g/dL — ABNORMAL LOW (ref 12.0–15.0)
Hemoglobin: 10.9 g/dL — ABNORMAL LOW (ref 12.0–15.0)
Hemoglobin: 8.8 g/dL — ABNORMAL LOW (ref 12.0–15.0)
Hemoglobin: 8.8 g/dL — ABNORMAL LOW (ref 12.0–15.0)
Hemoglobin: 8.2 g/dL — ABNORMAL LOW (ref 12.0–15.0)
Hemoglobin: 7.8 g/dL — ABNORMAL LOW (ref 12.0–15.0)
Hemoglobin: 8.8 g/dL — ABNORMAL LOW (ref 12.0–15.0)
POTASSIUM: 3.7 mmol/L (ref 3.5–5.1)
POTASSIUM: 3.8 mmol/L (ref 3.5–5.1)
POTASSIUM: 5.8 mmol/L — AB (ref 3.5–5.1)
POTASSIUM: 4 mmol/L (ref 3.5–5.1)
POTASSIUM: 4.4 mmol/L (ref 3.5–5.1)
Potassium: 3.8 mmol/L (ref 3.5–5.1)
Potassium: 4.7 mmol/L (ref 3.5–5.1)
Potassium: 4.7 mmol/L (ref 3.5–5.1)
SODIUM: 140 mmol/L (ref 135–145)
SODIUM: 139 mmol/L (ref 135–145)
SODIUM: 140 mmol/L (ref 135–145)
SODIUM: 140 mmol/L (ref 135–145)
SODIUM: 134 mmol/L — AB (ref 135–145)
SODIUM: 142 mmol/L (ref 135–145)
Sodium: 138 mmol/L (ref 135–145)
Sodium: 141 mmol/L (ref 135–145)
TCO2: 25 mmol/L (ref 0–100)
TCO2: 23 mmol/L (ref 0–100)
TCO2: 23 mmol/L (ref 0–100)
TCO2: 26 mmol/L (ref 0–100)
TCO2: 24 mmol/L (ref 0–100)
TCO2: 23 mmol/L (ref 0–100)
TCO2: 20 mmol/L (ref 0–100)
TCO2: 21 mmol/L (ref 0–100)

## 2015-11-19 LAB — MAGNESIUM: Magnesium: 3 mg/dL — ABNORMAL HIGH (ref 1.7–2.4)

## 2015-11-19 LAB — CBC
HCT: 29.8 % — ABNORMAL LOW (ref 36.0–46.0)
HEMATOCRIT: 31.3 % — AB (ref 36.0–46.0)
HEMOGLOBIN: 10.4 g/dL — AB (ref 12.0–15.0)
Hemoglobin: 9.7 g/dL — ABNORMAL LOW (ref 12.0–15.0)
MCH: 32.6 pg (ref 26.0–34.0)
MCH: 32.6 pg (ref 26.0–34.0)
MCHC: 33.2 g/dL (ref 30.0–36.0)
MCHC: 32.6 g/dL (ref 30.0–36.0)
MCV: 98.1 fL (ref 78.0–100.0)
MCV: 100 fL (ref 78.0–100.0)
PLATELETS: 148 10*3/uL — AB (ref 150–400)
Platelets: 154 10*3/uL (ref 150–400)
RBC: 3.19 MIL/uL — ABNORMAL LOW (ref 3.87–5.11)
RBC: 2.98 MIL/uL — ABNORMAL LOW (ref 3.87–5.11)
RDW: 13 % (ref 11.5–15.5)
RDW: 13.1 % (ref 11.5–15.5)
WBC: 12.6 10*3/uL — AB (ref 4.0–10.5)
WBC: 10.9 10*3/uL — AB (ref 4.0–10.5)

## 2015-11-19 LAB — CREATININE, SERUM
CREATININE: 0.75 mg/dL (ref 0.44–1.00)
GFR calc Af Amer: >60 mL/min (ref >60)

## 2015-11-19 LAB — POCT I-STAT 4, (NA,K, GLUC, HGB,HCT)
GLUCOSE: 103 mg/dL — AB (ref 65–99)
HEMATOCRIT: 31 % — AB (ref 36.0–46.0)
HEMOGLOBIN: 10.5 g/dL — AB (ref 12.0–15.0)
POTASSIUM: 4 mmol/L (ref 3.5–5.1)
SODIUM: 141 mmol/L (ref 135–145)

## 2015-11-19 LAB — PLATELET COUNT: Platelets: 121 10*3/uL — ABNORMAL LOW (ref 150–400)

## 2015-11-19 LAB — GLUCOSE, CAPILLARY
GLUCOSE-CAPILLARY: 128 mg/dL — AB (ref 65–99)
Glucose-Capillary: 108 mg/dL — ABNORMAL HIGH (ref 65–99)
Glucose-Capillary: 73 mg/dL (ref 65–99)

## 2015-11-19 LAB — PROTIME-INR
INR: 1.49 (ref 0.00–1.49)
PROTHROMBIN TIME: 18.1 s — AB (ref 11.6–15.2)

## 2015-11-19 LAB — APTT: aPTT: 31 seconds (ref 24–37)

## 2015-11-19 LAB — HEMOGLOBIN AND HEMATOCRIT, BLOOD
HCT: 23 % — ABNORMAL LOW (ref 36.0–46.0)
HEMOGLOBIN: 7.5 g/dL — AB (ref 12.0–15.0)

## 2015-11-19 LAB — PREPARE RBC (CROSSMATCH)

## 2015-11-19 SURGERY — REPAIR, MITRAL VALVE, MINIMALLY INVASIVE
Anesthesia: General | Site: Chest | Laterality: Right

## 2015-11-19 MED ORDER — SODIUM CHLORIDE 0.9 % IV SOLN
INTRAVENOUS | Status: DC
  Filled 2015-11-19: qty 2.5

## 2015-11-19 MED ORDER — LACTATED RINGERS IV SOLN
INTRAVENOUS | Status: DC | PRN
Start: 2015-11-19 — End: 2015-11-19
  Administered 2015-11-19 (×2): via INTRAVENOUS

## 2015-11-19 MED ORDER — NITROGLYCERIN IN D5W 200-5 MCG/ML-% IV SOLN: 0.0000 ug/min | INTRAVENOUS | Status: DC

## 2015-11-19 MED ORDER — VANCOMYCIN HCL IN DEXTROSE 1-5 GM/200ML-% IV SOLN
1000.0000 mg | Freq: Once | INTRAVENOUS | Status: AC
  Administered 2015-11-19: 1000 mg via INTRAVENOUS
  Filled 2015-11-19: qty 200

## 2015-11-19 MED ORDER — INSULIN ASPART 100 UNIT/ML ~~LOC~~ SOLN
0.0000 [IU] | SUBCUTANEOUS | Status: DC
  Administered 2015-11-19 – 2015-11-21 (×7): 2 [IU] via SUBCUTANEOUS

## 2015-11-19 MED ORDER — METOPROLOL TARTRATE 12.5 MG HALF TABLET: 12.5000 mg | ORAL_TABLET | Freq: Two times a day (BID) | ORAL | Status: DC

## 2015-11-19 MED ORDER — HEPARIN SODIUM (PORCINE) 1000 UNIT/ML IJ SOLN
INTRAMUSCULAR | Status: DC | PRN
  Administered 2015-11-19: 22000 [IU] via INTRAVENOUS

## 2015-11-19 MED ORDER — ANTISEPTIC ORAL RINSE SOLUTION (CORINZ)
7.0000 mL | Freq: Four times a day (QID) | OROMUCOSAL | Status: DC
  Administered 2015-11-20 (×2): 7 mL via OROMUCOSAL

## 2015-11-19 MED ORDER — ALBUMIN HUMAN 5 % IV SOLN
250.0000 mL | INTRAVENOUS | Status: DC | PRN
  Administered 2015-11-19 (×2): 250 mL via INTRAVENOUS
  Filled 2015-11-19: qty 250

## 2015-11-19 MED ORDER — ACETAMINOPHEN 160 MG/5ML PO SOLN: 650.0000 mg | Freq: Once | ORAL | Status: AC

## 2015-11-19 MED ORDER — LACTATED RINGERS IV SOLN
INTRAVENOUS | Status: DC | PRN
  Administered 2015-11-19: 07:00:00 via INTRAVENOUS

## 2015-11-19 MED ORDER — ACETAMINOPHEN 160 MG/5ML PO SOLN: 1000.0000 mg | Freq: Four times a day (QID) | ORAL | Status: DC

## 2015-11-19 MED ORDER — MORPHINE SULFATE (PF) 2 MG/ML IV SOLN: 1.0000 mg | INTRAVENOUS | Status: DC | PRN

## 2015-11-19 MED ORDER — METOPROLOL TARTRATE 5 MG/5ML IV SOLN: 2.5000 mg | INTRAVENOUS | Status: DC | PRN

## 2015-11-19 MED ORDER — BISACODYL 10 MG RE SUPP: 10.0000 mg | Freq: Every day | RECTAL | Status: DC

## 2015-11-19 MED ORDER — MAGNESIUM SULFATE 4 GM/100ML IV SOLN
4.0000 g | Freq: Once | INTRAVENOUS | Status: AC
  Administered 2015-11-19: 4 g via INTRAVENOUS
  Filled 2015-11-19: qty 100

## 2015-11-19 MED ORDER — LACTATED RINGERS IV SOLN: 500.0000 mL | Freq: Once | INTRAVENOUS | Status: DC | PRN

## 2015-11-19 MED ORDER — SODIUM BICARBONATE 8.4 % IV SOLN
50.0000 meq | Freq: Once | INTRAVENOUS | Status: AC
  Administered 2015-11-19: 50 meq via INTRAVENOUS

## 2015-11-19 MED ORDER — MIDAZOLAM HCL 2 MG/2ML IJ SOLN: 2.0000 mg | INTRAMUSCULAR | Status: DC | PRN

## 2015-11-19 MED ORDER — FENTANYL CITRATE (PF) 250 MCG/5ML IJ SOLN
INTRAMUSCULAR | Status: AC
  Filled 2015-11-19: qty 10

## 2015-11-19 MED ORDER — ALBUMIN HUMAN 5 % IV SOLN
INTRAVENOUS | Status: DC | PRN
Start: 2015-11-19 — End: 2015-11-19
  Administered 2015-11-19: 14:00:00 via INTRAVENOUS

## 2015-11-19 MED ORDER — PROTAMINE SULFATE 10 MG/ML IV SOLN
INTRAVENOUS | Status: DC | PRN
  Administered 2015-11-19: 200 mg via INTRAVENOUS
  Administered 2015-11-19: 20 mg via INTRAVENOUS

## 2015-11-19 MED ORDER — DEXTROSE 5 % IV SOLN
1.5000 g | Freq: Two times a day (BID) | INTRAVENOUS | Status: AC
  Administered 2015-11-20 – 2015-11-21 (×4): 1.5 g via INTRAVENOUS
  Filled 2015-11-19 (×7): qty 1.5

## 2015-11-19 MED ORDER — ASPIRIN EC 325 MG PO TBEC
325.0000 mg | DELAYED_RELEASE_TABLET | Freq: Every day | ORAL | Status: DC
  Administered 2015-11-20 – 2015-11-25 (×6): 325 mg via ORAL
  Filled 2015-11-19 (×6): qty 1

## 2015-11-19 MED ORDER — ONDANSETRON HCL 4 MG/2ML IJ SOLN
4.0000 mg | Freq: Four times a day (QID) | INTRAMUSCULAR | Status: DC | PRN
  Administered 2015-11-19 – 2015-11-20 (×4): 4 mg via INTRAVENOUS
  Filled 2015-11-19 (×4): qty 2

## 2015-11-19 MED ORDER — BISACODYL 5 MG PO TBEC
10.0000 mg | DELAYED_RELEASE_TABLET | Freq: Every day | ORAL | Status: DC
  Administered 2015-11-20 – 2015-11-25 (×4): 10 mg via ORAL
  Filled 2015-11-19 (×5): qty 2

## 2015-11-19 MED ORDER — FENTANYL CITRATE (PF) 100 MCG/2ML IJ SOLN
INTRAMUSCULAR | Status: DC | PRN
  Administered 2015-11-19: 250 ug via INTRAVENOUS
  Administered 2015-11-19: 100 ug via INTRAVENOUS
  Administered 2015-11-19: 250 ug via INTRAVENOUS
  Administered 2015-11-19 (×2): 150 ug via INTRAVENOUS
  Administered 2015-11-19: 50 ug via INTRAVENOUS
  Administered 2015-11-19: 100 ug via INTRAVENOUS
  Administered 2015-11-19: 150 ug via INTRAVENOUS
  Administered 2015-11-19: 100 ug via INTRAVENOUS
  Administered 2015-11-19: 150 ug via INTRAVENOUS
  Administered 2015-11-19: 50 ug via INTRAVENOUS
  Administered 2015-11-19: 250 ug via INTRAVENOUS

## 2015-11-19 MED ORDER — ACETAMINOPHEN 650 MG RE SUPP
650.0000 mg | Freq: Once | RECTAL | Status: AC
  Administered 2015-11-19: 650 mg via RECTAL

## 2015-11-19 MED ORDER — OXYCODONE HCL 5 MG PO TABS
5.0000 mg | ORAL_TABLET | ORAL | Status: DC | PRN
  Administered 2015-11-20 (×4): 10 mg via ORAL
  Filled 2015-11-19 (×4): qty 2

## 2015-11-19 MED ORDER — SODIUM CHLORIDE 0.9 % IV SOLN
INTRAVENOUS | Status: DC
  Administered 2015-11-19: 16:00:00 via INTRAVENOUS

## 2015-11-19 MED ORDER — INSULIN REGULAR BOLUS VIA INFUSION
0.0000 [IU] | Freq: Three times a day (TID) | INTRAVENOUS | Status: DC
  Filled 2015-11-19: qty 10

## 2015-11-19 MED ORDER — SODIUM BICARBONATE 8.4 % IV SOLN
50.0000 meq | Freq: Once | INTRAVENOUS | Status: AC
  Administered 2015-11-19: 50 meq via INTRAVENOUS
  Filled 2015-11-19: qty 50

## 2015-11-19 MED ORDER — PROPOFOL 10 MG/ML IV BOLUS
INTRAVENOUS | Status: AC
  Filled 2015-11-19: qty 20

## 2015-11-19 MED ORDER — FENTANYL CITRATE (PF) 250 MCG/5ML IJ SOLN
INTRAMUSCULAR | Status: AC
  Filled 2015-11-19: qty 25

## 2015-11-19 MED ORDER — 0.9 % SODIUM CHLORIDE (POUR BTL) OPTIME
TOPICAL | Status: DC | PRN
  Administered 2015-11-19: 5000 mL

## 2015-11-19 MED ORDER — ACETAMINOPHEN 500 MG PO TABS
1000.0000 mg | ORAL_TABLET | Freq: Four times a day (QID) | ORAL | Status: AC
  Administered 2015-11-19 – 2015-11-24 (×14): 1000 mg via ORAL
  Filled 2015-11-19 (×16): qty 2

## 2015-11-19 MED ORDER — SODIUM CHLORIDE 0.9% FLUSH
3.0000 mL | Freq: Two times a day (BID) | INTRAVENOUS | Status: DC
  Administered 2015-11-20 – 2015-11-23 (×6): 3 mL via INTRAVENOUS

## 2015-11-19 MED ORDER — CHLORHEXIDINE GLUCONATE 0.12% ORAL RINSE (MEDLINE KIT): 15.0000 mL | Freq: Two times a day (BID) | OROMUCOSAL | Status: DC

## 2015-11-19 MED ORDER — DEXMEDETOMIDINE HCL IN NACL 200 MCG/50ML IV SOLN
0.0000 ug/kg/h | INTRAVENOUS | Status: DC
  Filled 2015-11-19: qty 50

## 2015-11-19 MED ORDER — CHLORHEXIDINE GLUCONATE 0.12 % MT SOLN
15.0000 mL | OROMUCOSAL | Status: AC
  Administered 2015-11-19: 15 mL via OROMUCOSAL

## 2015-11-19 MED ORDER — LACTATED RINGERS IV SOLN
INTRAVENOUS | Status: DC | PRN
  Administered 2015-11-19 (×2): via INTRAVENOUS

## 2015-11-19 MED ORDER — MIDAZOLAM HCL 10 MG/2ML IJ SOLN
INTRAMUSCULAR | Status: AC
  Filled 2015-11-19: qty 2

## 2015-11-19 MED ORDER — POTASSIUM CHLORIDE 10 MEQ/50ML IV SOLN: 10.0000 meq | INTRAVENOUS | Status: AC

## 2015-11-19 MED ORDER — SODIUM CHLORIDE 0.45 % IV SOLN
INTRAVENOUS | Status: DC | PRN
  Administered 2015-11-19: 15:00:00 via INTRAVENOUS

## 2015-11-19 MED ORDER — LACTATED RINGERS IV SOLN: INTRAVENOUS | Status: DC

## 2015-11-19 MED ORDER — ASPIRIN 81 MG PO CHEW: 324.0000 mg | CHEWABLE_TABLET | Freq: Every day | ORAL | Status: DC

## 2015-11-19 MED ORDER — PHENYLEPHRINE HCL 10 MG/ML IJ SOLN
20.0000 mg | INTRAVENOUS | Status: DC | PRN
  Administered 2015-11-19: 25 ug/min via INTRAVENOUS

## 2015-11-19 MED ORDER — BUPIVACAINE 0.5 % ON-Q PUMP SINGLE CATH 400 ML
400.0000 mL | INJECTION | Status: DC
  Filled 2015-11-19: qty 400

## 2015-11-19 MED ORDER — DOCUSATE SODIUM 100 MG PO CAPS
200.0000 mg | ORAL_CAPSULE | Freq: Every day | ORAL | Status: DC
  Administered 2015-11-20 – 2015-11-25 (×5): 200 mg via ORAL
  Filled 2015-11-19 (×7): qty 2

## 2015-11-19 MED ORDER — METOPROLOL TARTRATE 25 MG/10 ML ORAL SUSPENSION: 12.5000 mg | Freq: Two times a day (BID) | ORAL | Status: DC

## 2015-11-19 MED ORDER — PHENYLEPHRINE HCL 10 MG/ML IJ SOLN
0.0000 ug/min | INTRAVENOUS | Status: DC
  Filled 2015-11-19: qty 2

## 2015-11-19 MED ORDER — FAMOTIDINE IN NACL 20-0.9 MG/50ML-% IV SOLN
20.0000 mg | Freq: Two times a day (BID) | INTRAVENOUS | Status: DC
  Administered 2015-11-19: 20 mg via INTRAVENOUS

## 2015-11-19 MED ORDER — GLYCOPYRROLATE 0.2 MG/ML IJ SOLN
INTRAMUSCULAR | Status: DC | PRN
Start: 2015-11-19 — End: 2015-11-19
  Administered 2015-11-19 (×2): 0.2 mg via INTRAVENOUS

## 2015-11-19 MED ORDER — CHLORHEXIDINE GLUCONATE 4 % EX LIQD: 30.0000 mL | CUTANEOUS | Status: DC

## 2015-11-19 MED ORDER — SODIUM CHLORIDE 0.9 % IV SOLN
250.0000 mL | INTRAVENOUS | Status: DC
  Administered 2015-11-21: 250 mL via INTRAVENOUS

## 2015-11-19 MED ORDER — PANTOPRAZOLE SODIUM 40 MG PO TBEC
40.0000 mg | DELAYED_RELEASE_TABLET | Freq: Every day | ORAL | Status: DC
  Administered 2015-11-21 – 2015-11-25 (×5): 40 mg via ORAL
  Filled 2015-11-19 (×5): qty 1

## 2015-11-19 MED ORDER — ROCURONIUM BROMIDE 100 MG/10ML IV SOLN
INTRAVENOUS | Status: DC | PRN
  Administered 2015-11-19: 50 mg via INTRAVENOUS

## 2015-11-19 MED ORDER — MIDAZOLAM HCL 5 MG/5ML IJ SOLN
INTRAMUSCULAR | Status: DC | PRN
  Administered 2015-11-19 (×3): 1 mg via INTRAVENOUS
  Administered 2015-11-19 (×2): 2 mg via INTRAVENOUS
  Administered 2015-11-19: 3 mg via INTRAVENOUS

## 2015-11-19 MED ORDER — SODIUM CHLORIDE 0.9% FLUSH: 3.0000 mL | INTRAVENOUS | Status: DC | PRN

## 2015-11-19 MED ORDER — MORPHINE SULFATE (PF) 2 MG/ML IV SOLN
2.0000 mg | INTRAVENOUS | Status: DC | PRN
  Administered 2015-11-19 – 2015-11-20 (×3): 2 mg via INTRAVENOUS
  Administered 2015-11-20 (×2): 4 mg via INTRAVENOUS
  Filled 2015-11-19 (×3): qty 1
  Filled 2015-11-19 (×2): qty 2

## 2015-11-19 MED ORDER — TRAMADOL HCL 50 MG PO TABS
50.0000 mg | ORAL_TABLET | ORAL | Status: DC | PRN
  Administered 2015-11-20 – 2015-11-25 (×7): 100 mg via ORAL
  Filled 2015-11-19 (×8): qty 2

## 2015-11-19 MED ORDER — PROPOFOL 10 MG/ML IV BOLUS
INTRAVENOUS | Status: DC | PRN
  Administered 2015-11-19: 140 mg via INTRAVENOUS

## 2015-11-19 MED ORDER — VECURONIUM BROMIDE 10 MG IV SOLR
INTRAVENOUS | Status: DC | PRN
  Administered 2015-11-19: 5 mg via INTRAVENOUS
  Administered 2015-11-19: 3 mg via INTRAVENOUS
  Administered 2015-11-19 (×2): 5 mg via INTRAVENOUS

## 2015-11-19 MED FILL — Heparin Sodium (Porcine) Inj 1000 Unit/ML: INTRAMUSCULAR | Qty: 10 | Status: AC

## 2015-11-19 MED FILL — Mannitol IV Soln 20%: INTRAVENOUS | Qty: 500 | Status: AC

## 2015-11-19 MED FILL — Sodium Bicarbonate IV Soln 8.4%: INTRAVENOUS | Qty: 50 | Status: AC

## 2015-11-19 MED FILL — Potassium Chloride Inj 2 mEq/ML: INTRAVENOUS | Qty: 40 | Status: AC

## 2015-11-19 MED FILL — Heparin Sodium (Porcine) Inj 1000 Unit/ML: INTRAMUSCULAR | Qty: 30 | Status: AC

## 2015-11-19 MED FILL — Electrolyte-R (PH 7.4) Solution: INTRAVENOUS | Qty: 4000 | Status: AC

## 2015-11-19 MED FILL — Magnesium Sulfate Inj 50%: INTRAMUSCULAR | Qty: 10 | Status: AC

## 2015-11-19 MED FILL — Sodium Chloride IV Soln 0.9%: INTRAVENOUS | Qty: 2000 | Status: AC

## 2015-11-19 MED FILL — Lidocaine HCl IV Inj 20 MG/ML: INTRAVENOUS | Qty: 5 | Status: AC

## 2015-11-19 SURGICAL SUPPLY — 133 items
ADAPTER CARDIO PERF ANTE/RETRO (ADAPTER) ×4 IMPLANT
ADH SKN CLS APL DERMABOND .7 (GAUZE/BANDAGES/DRESSINGS) ×6
ADPR PRFSN 84XANTGRD RTRGD (ADAPTER) ×3
ARTICLIP LAA PROCLIP II 40 (Clip) ×4 IMPLANT
BAG DECANTER FOR FLEXI CONT (MISCELLANEOUS) ×8 IMPLANT
BLADE SURG 11 STRL SS (BLADE) ×4 IMPLANT
CANISTER SUCTION 2500CC (MISCELLANEOUS) ×8 IMPLANT
CANNULA FEM VENOUS REMOTE 22FR (CANNULA) ×1 IMPLANT
CANNULA FEMORAL ART 14 SM (MISCELLANEOUS) ×4 IMPLANT
CANNULA GUNDRY RCSP 15FR (MISCELLANEOUS) ×4 IMPLANT
CANNULA OPTISITE PERFUSION 16F (CANNULA) ×1 IMPLANT
CANNULA OPTISITE PERFUSION 18F (CANNULA) IMPLANT
CANNULA SUMP PERICARDIAL (CANNULA) ×8 IMPLANT
CARDIOBLATE CARDIAC ABLATION (MISCELLANEOUS)
CATH KIT ON Q 5IN SLV (PAIN MANAGEMENT) ×2 IMPLANT
CLAMP OLL ABLATION (MISCELLANEOUS) ×1 IMPLANT
CONN ST 1/4X3/8  BEN (MISCELLANEOUS) ×2
CONN ST 1/4X3/8 BEN (MISCELLANEOUS) ×6 IMPLANT
CONNECTOR 1/2X3/8X1/2 3 WAY (MISCELLANEOUS) ×2
CONNECTOR 1/2X3/8X1/2 3WAY (MISCELLANEOUS) ×3 IMPLANT
CONT SPEC STER OR (MISCELLANEOUS) ×4 IMPLANT
COVER BACK TABLE 24X17X13 BIG (DRAPES) ×4 IMPLANT
CRADLE DONUT ADULT HEAD (MISCELLANEOUS) ×4 IMPLANT
DERMABOND ADVANCED (GAUZE/BANDAGES/DRESSINGS) ×2
DERMABOND ADVANCED .7 DNX12 (GAUZE/BANDAGES/DRESSINGS) ×6 IMPLANT
DEVICE ATRICLIP LAA PRCLPII 40 (Clip) IMPLANT
DEVICE CARDIOBLATE CARDIAC ABL (MISCELLANEOUS) IMPLANT
DEVICE PMI PUNCTURE CLOSURE (MISCELLANEOUS) ×4 IMPLANT
DEVICE SUT CK QUICK LOAD MINI (Prosthesis & Implant Heart) ×2 IMPLANT
DEVICE TROCAR PUNCTURE CLOSURE (ENDOMECHANICALS) ×4 IMPLANT
DRAIN CHANNEL 28F RND 3/8 FF (WOUND CARE) ×8 IMPLANT
DRAPE BILATERAL SPLIT (DRAPES) ×4 IMPLANT
DRAPE C-ARM 42X72 X-RAY (DRAPES) ×4 IMPLANT
DRAPE CV SPLIT W-CLR ANES SCRN (DRAPES) ×4 IMPLANT
DRAPE INCISE IOBAN 66X45 STRL (DRAPES) ×12 IMPLANT
DRAPE SLUSH/WARMER DISC (DRAPES) ×4 IMPLANT
DRSG COVADERM 4X8 (GAUZE/BANDAGES/DRESSINGS) ×4 IMPLANT
ELECT BLADE 6.5 EXT (BLADE) ×4 IMPLANT
ELECT REM PT RETURN 9FT ADLT (ELECTROSURGICAL) ×8
ELECTRODE REM PT RTRN 9FT ADLT (ELECTROSURGICAL) ×6 IMPLANT
FELT TEFLON 1X6 (MISCELLANEOUS) ×4 IMPLANT
FEMORAL VENOUS CANN RAP (CANNULA) IMPLANT
GAUZE SPONGE 4X4 12PLY STRL (GAUZE/BANDAGES/DRESSINGS) ×4 IMPLANT
GLOVE BIOGEL PI IND STRL 6 (GLOVE) ×3 IMPLANT
GLOVE BIOGEL PI IND STRL 6.5 (GLOVE) IMPLANT
GLOVE BIOGEL PI INDICATOR 6 (GLOVE) ×3
GLOVE BIOGEL PI INDICATOR 6.5 (GLOVE) ×7
GLOVE ORTHO TXT STRL SZ7.5 (GLOVE) ×12 IMPLANT
GLOVE SURG SS PI 6.0 STRL IVOR (GLOVE) ×1 IMPLANT
GOWN STRL REUS W/ TWL LRG LVL3 (GOWN DISPOSABLE) ×12 IMPLANT
GOWN STRL REUS W/TWL LRG LVL3 (GOWN DISPOSABLE) ×16
IV NS IRRIG 3000ML ARTHROMATIC (IV SOLUTION) ×1 IMPLANT
KIT BASIN OR (CUSTOM PROCEDURE TRAY) ×4 IMPLANT
KIT DILATOR VASC 18G NDL (KITS) ×5 IMPLANT
KIT DRAINAGE VACCUM ASSIST (KITS) ×1 IMPLANT
KIT ROOM TURNOVER OR (KITS) ×4 IMPLANT
KIT SUCTION CATH 14FR (SUCTIONS) ×6 IMPLANT
KIT SUT CK MINI COMBO 4X17 (Prosthesis & Implant Heart) ×2 IMPLANT
LEAD PACING MYOCARDI (MISCELLANEOUS) ×5 IMPLANT
LINE VENT (MISCELLANEOUS) ×2 IMPLANT
LOOP VESSEL SUPERMAXI WHITE (MISCELLANEOUS) ×1 IMPLANT
NDL AORTIC ROOT 14G 7F (CATHETERS) ×2 IMPLANT
NEEDLE AORTIC ROOT 14G 7F (CATHETERS) ×4 IMPLANT
NS IRRIG 1000ML POUR BTL (IV SOLUTION) ×20 IMPLANT
PACK OPEN HEART (CUSTOM PROCEDURE TRAY) ×4 IMPLANT
PAD ARMBOARD 7.5X6 YLW CONV (MISCELLANEOUS) ×8 IMPLANT
PAD ELECT DEFIB RADIOL ZOLL (MISCELLANEOUS) ×4 IMPLANT
PATCH CORMATRIX 4CMX7CM (Prosthesis & Implant Heart) ×1 IMPLANT
PROBE CRYO2-ABLATION MALLABLE (MISCELLANEOUS) ×1 IMPLANT
RETRACTOR TRL SOFT TISSUE LG (INSTRUMENTS) IMPLANT
RETRACTOR TRM SOFT TISSUE 7.5 (INSTRUMENTS) IMPLANT
RING ANLPLS CARP-EDW PHY II 34 (Prosthesis & Implant Heart) ×1 IMPLANT
RING ANNULOPLASTY PHY II (Prosthesis & Implant Heart) ×4 IMPLANT
SET CANNULATION TOURNIQUET (MISCELLANEOUS) ×4 IMPLANT
SET CARDIOPLEGIA MPS 5001102 (MISCELLANEOUS) ×1 IMPLANT
SET IRRIG TUBING LAPAROSCOPIC (IRRIGATION / IRRIGATOR) ×4 IMPLANT
SIZER CHORD-X CHORDAL CXCS ×1 IMPLANT
SOLUTION ANTI FOG 6CC (MISCELLANEOUS) ×4 IMPLANT
SPONGE GAUZE 4X4 12PLY STER LF (GAUZE/BANDAGES/DRESSINGS) ×4 IMPLANT
SUT BONE WAX W31G (SUTURE) ×4 IMPLANT
SUT E-PACK MINIMALLY INVASIVE (SUTURE) ×3 IMPLANT
SUT ETHIBOND (SUTURE) ×2 IMPLANT
SUT ETHIBOND 2 0 SH (SUTURE) ×2 IMPLANT
SUT ETHIBOND 2 0 V4 (SUTURE) IMPLANT
SUT ETHIBOND 2 0V4 GREEN (SUTURE) IMPLANT
SUT ETHIBOND 2-0 RB-1 WHT (SUTURE) ×4 IMPLANT
SUT ETHIBOND 4 0 TF (SUTURE) IMPLANT
SUT ETHIBOND 5 0 C 1 30 (SUTURE) ×2 IMPLANT
SUT ETHIBOND NAB MH 2-0 36IN (SUTURE) ×1 IMPLANT
SUT ETHIBOND X763 2 0 SH 1 (SUTURE) ×4 IMPLANT
SUT GORETEX 6.0 TH-9 30 IN (SUTURE) IMPLANT
SUT GORETEX CV 4 TH 22 36 (SUTURE) ×4 IMPLANT
SUT GORETEX CV-5THC-13 36IN (SUTURE) IMPLANT
SUT GORETEX CV4 TH-18 (SUTURE) ×8 IMPLANT
SUT GORETEX TH-18 36 INCH (SUTURE) IMPLANT
SUT MNCRL AB 3-0 PS2 27 (SUTURE) ×4 IMPLANT
SUT PROLENE 3 0 SH DA (SUTURE) ×4 IMPLANT
SUT PROLENE 3 0 SH1 36 (SUTURE) ×16 IMPLANT
SUT PROLENE 4 0 RB 1 (SUTURE) ×16
SUT PROLENE 4-0 RB1 .5 CRCL 36 (SUTURE) IMPLANT
SUT PROLENE 6 0 C 1 30 (SUTURE) ×2 IMPLANT
SUT PTFE CHORD X 20MM (SUTURE) ×2 IMPLANT
SUT PTFE CHORD X 24MM (SUTURE) ×2 IMPLANT
SUT SILK  1 MH (SUTURE) ×9
SUT SILK 1 MH (SUTURE) ×9 IMPLANT
SUT SILK 1 TIES 10X30 (SUTURE) ×1 IMPLANT
SUT SILK 2 0 SH CR/8 (SUTURE) ×1 IMPLANT
SUT SILK 2 0 TIES 10X30 (SUTURE) ×1 IMPLANT
SUT SILK 2 0SH CR/8 30 (SUTURE) ×2 IMPLANT
SUT SILK 3 0 (SUTURE) ×12
SUT SILK 3 0 SH CR/8 (SUTURE) ×1 IMPLANT
SUT SILK 3 0SH CR/8 30 (SUTURE) ×1 IMPLANT
SUT SILK 3-0 18XBRD TIE 12 (SUTURE) IMPLANT
SUT TEM PAC WIRE 2 0 SH (SUTURE) ×2 IMPLANT
SUT VIC AB 2-0 CTX 36 (SUTURE) ×2 IMPLANT
SUT VIC AB 2-0 UR6 27 (SUTURE) ×2 IMPLANT
SUT VIC AB 3-0 SH 8-18 (SUTURE) ×1 IMPLANT
SUT VICRYL 2 TP 1 (SUTURE) ×2 IMPLANT
SYRINGE 10CC LL (SYRINGE) ×4 IMPLANT
SYSTEM SAHARA CHEST DRAIN ATS (WOUND CARE) ×8 IMPLANT
TAPE PAPER 2X10 WHT MICROPORE (GAUZE/BANDAGES/DRESSINGS) ×1 IMPLANT
TOWEL OR 17X24 6PK STRL BLUE (TOWEL DISPOSABLE) ×9 IMPLANT
TOWEL OR 17X26 10 PK STRL BLUE (TOWEL DISPOSABLE) ×8 IMPLANT
TRAY FOLEY CATH SILVER 16FR LF (SET/KITS/TRAYS/PACK) ×2 IMPLANT
TRAY FOLEY IC TEMP SENS 16FR (CATHETERS) ×3 IMPLANT
TROCAR XCEL BLADELESS 5X75MML (TROCAR) ×4 IMPLANT
TROCAR XCEL NON-BLD 11X100MML (ENDOMECHANICALS) ×8 IMPLANT
TUBE SUCT INTRACARD DLP 20F (MISCELLANEOUS) ×4 IMPLANT
TUNNELER SHEATH ON-Q 11GX8 DSP (PAIN MANAGEMENT) ×1 IMPLANT
UNDERPAD 30X30 INCONTINENT (UNDERPADS AND DIAPERS) ×4 IMPLANT
WATER STERILE IRR 1000ML POUR (IV SOLUTION) ×8 IMPLANT
WIRE J 3MM .035X145CM (WIRE) ×6 IMPLANT
YANKAUER SUCT BULB TIP NO VENT (SUCTIONS) ×1 IMPLANT

## 2015-11-19 NOTE — Anesthesia Postprocedure Evaluation (Signed)
Anesthesia Post Note  Patient: [name]  Procedure(s) Performed: Procedure(s) (LRB): MINIMALLY INVASIVE MITRAL VALVE REPAIR (MVR) (Right) MINIMALLY INVASIVE MAZE PROCEDURE (N/A) TRANSESOPHAGEAL ECHOCARDIOGRAM (TEE) (N/A) CLIPPING OF ATRIAL APPENDAGE (N/A)  Patient location during evaluation: SICU Anesthesia Type: General Level of consciousness: sedated Pain management: pain level controlled Vital Signs Assessment: post-procedure vital signs reviewed and stable Respiratory status: patient remains intubated per anesthesia plan Cardiovascular status: stable Anesthetic complications: no    Last Vitals:  Filed Vitals:   11/19/15 0602 11/19/15 1458  BP: 162/82 130/85  Pulse: 62 80  Temp: 36.8 C   Resp: 20 12    Last Pain: There were no vitals filed for this visit.               Effie Berkshire

## 2015-11-19 NOTE — Transfer of Care (Signed)
Immediate Anesthesia Transfer of Care Note  Patient: [name]  Procedure(s) Performed: Procedure(s): MINIMALLY INVASIVE MITRAL VALVE REPAIR (MVR) (Right) MINIMALLY INVASIVE MAZE PROCEDURE (N/A) TRANSESOPHAGEAL ECHOCARDIOGRAM (TEE) (N/A) CLIPPING OF ATRIAL APPENDAGE (N/A)  Patient Location: SICU  Anesthesia Type:General  Level of Consciousness: Patient remains intubated per anesthesia plan  Airway & Oxygen Therapy: Patient remains intubated per anesthesia plan  Post-op Assessment: Report given to RN and Post -op Vital signs reviewed and stable  Post vital signs: Reviewed and stable  Last Vitals:  Filed Vitals:   11/19/15 0602  BP: 162/82  Pulse: 62  Temp: 36.8 C  Resp: 20    Last Pain: There were no vitals filed for this visit.       Complications: No apparent anesthesia complications

## 2015-11-19 NOTE — OR Nursing (Signed)
14:00 - 1st call to unit, 14:40 - 2nd call to SICU

## 2015-11-19 NOTE — Procedures (Signed)
Extubation Procedure Note  Patient Details:   Name: SCOTTLYNN NUSSER DOB: [birth year] MRN: [id number]  Patient extuabted to 4L O2. Patient did 560 on VC and -20 NIF. Patient has no stridor. Cuff leak was present.     Evaluation  O2 sats: stable throughout Complications: No apparent complications Patient did tolerate procedure well. Bilateral Breath Sounds: Clear, Diminished   Yes  Kelle Darting 11/19/2015, [phone]

## 2015-11-19 NOTE — Interval H&P Note (Signed)
History and Physical Interval Note:  11/19/2015 6:22 AM  [name]  has presented today for surgery, with the diagnosis of MR  AFIB  The various methods of treatment have been discussed with the patient and family. After consideration of risks, benefits and other options for treatment, the patient has consented to  Procedure(s): MINIMALLY INVASIVE MITRAL VALVE REPAIR (MVR) (Right) MINIMALLY INVASIVE MAZE PROCEDURE (N/A) TRANSESOPHAGEAL ECHOCARDIOGRAM (TEE) (N/A) as a surgical intervention .  The patient's history has been reviewed, patient examined, no change in status, stable for surgery.  I have reviewed the patient's chart and labs.  Questions were answered to the patient's satisfaction.     Rexene Alberts

## 2015-11-19 NOTE — Anesthesia Procedure Notes (Addendum)
Central Venous Catheter Insertion Performed by: anesthesiologist 11/19/2015 7:14 AM Patient location: Pre-op. Preanesthetic checklist: patient identified, IV checked, risks and benefits discussed, surgical consent, monitors and equipment checked, pre-op evaluation and timeout performed Position: Trendelenburg Lidocaine 1% used for infiltration Landmarks identified and Seldinger technique used Catheter size: 8.5 Fr PA cath was placed.Sheath introducer Procedure performed using ultrasound guided technique. Attempts: 1 Following insertion, line sutured and dressing applied. Post procedure assessment: blood return through all ports, free fluid flow and no air. Patient tolerated the procedure well with no immediate complications. Additional procedure comments: PA catheter:  Routine monitors. Timeout, sterile prep, drape, FBP L neck.  Trendelenburg position.  1% Lido local, finder and trocar LIJ 1st pass with US guidance.  Cordis placed over J wire. PA catheter in easily.  Sterile dressing applied.  Patient tolerated well, VSS.  Jenita Seashore, MD.    Procedure Name: Intubation Date/Time: 11/19/2015 8:08 AM Performed by: Clearnce Sorrel Pre-anesthesia Checklist: Patient identified, Timeout performed, Emergency Drugs available, Suction available and Patient being monitored Patient Re-evaluated:Patient Re-evaluated prior to inductionOxygen Delivery Method: Circle system utilized Preoxygenation: Pre-oxygenation with 100% oxygen Intubation Type: IV induction Ventilation: Mask ventilation without difficulty and Oral airway inserted - appropriate to patient size Laryngoscope Size: Mac and 3 Grade View: Grade I Tube type: Oral Endobronchial tube: Left and Double lumen EBT and 37 Fr Number of attempts: 1 Airway Equipment and Method: Stylet Placement Confirmation: ETT inserted through vocal cords under direct vision,  breath sounds checked- equal and bilateral and positive ETCO2 Tube secured with:  Tape Dental Injury: Teeth and Oropharynx as per pre-operative assessment

## 2015-11-19 NOTE — Progress Notes (Signed)
Called Dr. Cyndia Bent at [facility] about 1-hour post-extubation ABG (pH 7.29, pCO2 40, pO2 120, BE -6, bicarb 19.8, spO2 98%). Pt currently on 4L [place]. MD gave orders for 1 amp of bicarb and a 1-hr f/u ABG, said to notify him if BE -3 or less.  Henreitta Leber, RN 8:35 PM  11/19/2015

## 2015-11-19 NOTE — Op Note (Signed)
CARDIOTHORACIC SURGERY OPERATIVE NOTE  Date of Procedure:   11/19/2015  Preoperative Diagnosis:    Severe Mitral Regurgitation  Recurrent Paroxysmal Atrial Fibrillation  Postoperative Diagnosis: Same  Procedure:    Minimally-Invasive Mitral Valve Repair  Complex valvuloplasty including artificial Gore-tex neochord placement x 22  Closure of cleft in posterior leaflet  Edwards Physio II Ring Annuloplasty (size 19mm, model #5200, serial [id number])   Minimally-Invasive Maze Procedure  Complete bilateral atrial lesion set using cryothermy and bipolar radiofrequency ablation  Clipping of Left Atrial Appendage (Atricure left atrial clip, size 40 mm)  Surgeon: 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 The patient states that she was noted to have a heart murmur during her youth and has been followed intermittently ever since with known history of mitral valve prolapse. Several years ago she moved to [place]. In May 2012 she was hospitalized with shortness of breath and atypical chest discomfort. She ruled out for myocardial infarction but echocardiogram revealed bileaflet prolapse with at least moderate mitral regurgitation and normal left ventricular systolic function. She was referred to Dr.  Aundra Dubin who performed transesophageal echocardiogram and exercise treadmill test. The patient was found to have moderate to severe mitral regurgitation but normal exercise tolerance without signs of symptomatic mitral regurgitation. She has been followed carefully ever since. The patient has remained physically active and exercises on a regular basis. In May 2016 the patient developed paroxysmal atrial fibrillation. She was started on Toprol-XL and Eliquis. Symptoms of intermittent palpitations have persisted ever since. Several months ago she began to experience shortness of breath with more strenuous activity. Follow-up echocardiogram performed 10/04/2015 revealed progression in the severity of mitral regurgitation with preserved left ventricular systolic function. She was seen in follow-up by Dr. Benjamine Mola and subsequently underwent TEE and diagnostic cardiac catheterization on 10/21/2015. TEE confirmed the presence of bileaflet prolapse of the mitral valve with severe mitral regurgitation. Left ventricular size and systolic function remains normal. There was moderate left atrial enlargement. Catheterization revealed moderate nonobstructive coronary artery disease with normal pulmonary artery pressures. [facility] surgical consultation was requested.  The patient has been seen in consultation and counseled at length regarding the indications, risks and potential benefits of surgery.  All questions have been answered, and the patient provides full informed consent for the operation as described.    DETAILS OF THE OPERATIVE PROCEDURE  Preparation:  The patient is brought to the operating room on the above mentioned date and central monitoring was established by the anesthesia team including placement of Swan-Ganz catheter through the left internal jugular vein.  A radial arterial line is placed. The patient is placed in the supine position on the operating table.  Intravenous antibiotics are  administered. General endotracheal anesthesia is induced uneventfully. The patient is initially intubated using a dual lumen endotracheal tube.  A Foley catheter is placed.  Baseline transesophageal  echocardiogram was performed.  Findings were notable for bileaflet prolapse of the mitral valve with severe mitral regurgitation.  The jet of regurgitation was central and filled the left atrium. There were multiple elongated chordae tendoneae to both the anterior and posterior leaflet repaired there did not appear to be any ruptured chordae tendoneae nor flail segments. Left atrium was dilated. There was normal left ventricular  Size and systolic function. There was trace tricuspid regurgitation. No other significant abnormalities were noted.  A soft roll is placed behind the patient's left scapula and the neck gently extended and turned to the left.   The patient's right neck, chest, abdomen, both groins, and both lower extremities are prepared and draped in a sterile manner. A time out procedure is performed.   Surgical Approach:  A right miniature anterolateral thoracotomy incision is performed. The incision is placed just lateral to and superior to the right nipple. The pectoralis major muscle is retracted medially and completely preserved. The right pleural space is entered through the 3rd intercostal space. A soft tissue retractor is placed.  Two 11 mm ports are placed through separate stab incisions inferiorly. The right pleural space is insufflated continuously with carbon dioxide gas through the posterior port during the remainder of the operation.  A pledgeted sutures placed through the dome of the right hemidiaphragm and retracted inferiorly to facilitate exposure.  A longitudinal incision is made in the pericardium 3 cm anterior to the phrenic nerve and silk traction sutures are placed on either side of the incision for exposure.   Extracorporeal Cardiopulmonary Bypass and Myocardial  Protection:  A small incision is made in the right inguinal crease and the anterior surface of the right common femoral artery and right common femoral vein are identified.  The patient is placed in Trendelenburg position. The right internal jugular vein is cannulated with Seldinger technique and a guidewire advanced into the right atrium. The patient is heparinized systemically. The right internal jugular vein is cannulated with a 14 [place] pediatric femoral venous cannula. Pursestring sutures are placed on the anterior surface of the right common femoral vein and right common femoral artery. The right common femoral vein is cannulated with the Seldinger technique and a guidewire is advanced under transesophageal echocardiogram guidance through the right atrium. The femoral vein is cannulated with a long 22 French femoral venous cannula. The right common femoral artery is cannulated with Seldinger technique and a flexible guidewire is advanced until it can be appreciated intraluminally in the descending thoracic aorta on transesophageal echocardiogram. The femoral artery is cannulated with an 18 French femoral arterial cannula.  Adequate heparinization is verified.      The entire pre-bypass portion of the operation was notable for stable hemodynamics.  Cardiopulmonary bypass was begun.  Vacuum assist venous drainage is utilized. The incision in the pericardium is extended in both directions. Venous drainage and exposure are notably excellent.   A retrograde cardioplegia cannula is placed through the right atrium into the coronary sinus using transesophageal echocardiogram guidance.  An antegrade cardioplegia cannula is placed in the ascending aorta.  The patient is cooled to 28C systemic temperature.  The aortic cross clamp is applied and cold blood cardioplegia is delivered initially in an antegrade fashion through the aortic root.   Supplemental cardioplegia is given retrograde through the coronary  sinus catheter. The initial cardioplegic arrest is rapid with early diastolic arrest.  Repeat doses of cardioplegia are administered intermittently every 20 to 30 minutes throughout the entire cross clamp  portion of the operation through the aortic root and through the coronary sinus catheter in order to maintain completely flat electrocardiogram.  Myocardial protection was felt to be excellent.   Maze Procedure (left atrial lesion set):  The left atrial appendage is obliterated using an Atricure left atrial appendage clip (Atriclip, size 67mm).  The clip is applied under thoracoscopic visualization posterior to the aorta and pulmonary artery through the oblique sinus.  The clip was applied prior to application of the aortic crossclamp, with transesophageal echocardiographic confirmation that the clip satisfactorily obliterates the appendage.  Following placement of the aortic crossclamp and the administration of the initial arresting dose of cardioplegia, a left atriotomy incision was performed through the interatrial groove and extended partially across the back wall of the left atrium after opening the oblique sinus inferiorly.  The mitral valve and floor of the left atrium are exposed using a self-retaining retractor.    The Atricure CryoICE nitrous oxide cryothermy system is utilized for all cryothermy ablation lesions.  The left atrial lesion set of the Cox cryomaze procedure is now performed using 3 minute duration for all cryothermy lesions.  Initially a lesion is placed along the endocardial surface of the left atrium from the caudad apex of the atriotomy incision across the posterior wall of the left atrium onto the posterior mitral annulus.  A mirror image lesion along the epicardial surface is then performed with the probe posterior to the left atrium, crossing over the coronary sinus.  Two lesions are then performed to create a box isolating all of the pulmonary veins from the remainder of the  left atrium.  The first lesion is placed from the cephalad apex of the atriotomy incision across the dome of the left atrium to just anterior to the left sided pulmonary veins.  The second lesion completes the box from the caudad apex of the atriotomy incision across the back wall of the left atrium to connect with the previous lesion just anterior to the left sided pulmonary veins.     Mitral Valve Repair:  The mitral valve was inspected and notable for classical Barlow's disease with billowing bileaflet prolapse.  The valve was large in size. Both the anterior and posterior leaflets were thickened and noted to have redundant tissue. Most of the primary chordae tendineae to both the anterior and posterior leaflet were elongated. There are no ruptured chordae tendineae.  There was no significant annular or subvalvular calcification.   Interrupted 2-0 Ethibond horizontal mattress sutures are placed circumferentially around the entire mitral valve annulus. The sutures will ultimately be utilized for ring annuloplasty, and at this juncture there are utilized to suspend the valve symmetrically.  Artificial neochord placement was performed using Chord-X multi-strand CV-4 Goretex pre-measured loops.  The appropriate cord length was measured from corresponding normal length secondary cords.  The pre-measured loops for the anterior leaflet were sized to 20 mm. The pre-measured loops to the posterior leaflet were sized to 24 mm. A total of 11 pairs of artificial Gore-Tex chordae tendineae were placed including 5 pairs to the anterior leaflet and 6 pairs to the posterior leaflet.  The papillary muscle suture of each Chord-X multi-strand suture was placed through the head of the appropriate papillary muscle in a horizontal mattress fashion and tied over Teflon felt pledgets. The first set of pre-measured loops was placed into the anterior papillary muscle and each of the 20 mm loops inserted into the free margin of  the anterior leaflet along the anterior side.  A second set of pre-measured loops was placed in the posterior papillary muscle and each of the 20 mm loops inserted into the free margin of the anterior leaflet on the posterior side.  One pair from this set was discarded, such that a total of 5 pairs (10 neochords) were placed into the anterior leaflet.  Similarly, 2 sets of pre-measured 24 mm loops were placed into the posterior leaflet including 3 pairs into the anterior papillary muscle and 3 pairs into the posterior papillary muscle.  Each of the three pre-measured loops were then reimplanted into the free margin of the posterior leaflet.  A total of 6 pairs (12 neochords) were placed into the posterior leaflet.  The valve is tested with saline and appears reasonably competent even prior to ring annuloplasty.  A large cleft between P1 and P2 was closed using a pair of interrupted everting 4-0 Prolene sutures.  The valve is sized to accept a 19mm annuloplasty ring based upon the distance between the left and right commissures, the height and the surface area of the anterior leaflet.  A Edwards Physio II annuloplasty ring (size 39mm, model # [id number], serial # [id number]) is implanted uneventfully.  All ring sutures were secured using a Cor-knot device.  The valve is again tested with saline and appears to be perfectly competent with a broad symmetrical line of coaptation of the anterior and posterior leaflet. There is no residual leak. Rewarming is begun.  The atriotomy was closed using a 2-layer closure of running 3-0 Prolene suture after placing a sump drain across the mitral valve to serve as a left ventricular vent.  One final dose of warm retrograde "hot shot" cardioplegia was administered retrograde through the coronary sinus catheter while all air was evacuated through the aortic root.  The aortic cross clamp was removed after a total cross clamp time of 155 minutes.   Maze Procedure (right atrial lesion  set):  The retrograde cardioplegia cannula was removed and the small hole in the right atrium extended a short distance.  The AtriCure Synergy bipolar radiofrequency ablation clamp is utilized to create a series of linear lesions in the right atrium, each with one limb of the clamp along the endocardial surface and the other along the epicardial surface. The first lesion is placed from the posterior apex of the atriotomy incision and along the lateral wall of the right atrium to reach the lateral aspect of the superior vena cava. A second lesion is placed in the opposite direction from the posterior apex of the atriotomy incision along the lateral wall to reach the lateral aspect of the inferior vena cava. A third lesion is placed from the midportion of the atriotomy incision extending at a right angle to reach the tip of the right atrial appendage. A fourth lesion is placed from the anterior apex of the atriotomy incision in an anterior and inferior direction to reach the acute margin of the heart. Finally, the cryotherapy probe is utilized to complete the right atrial lesion set by placing the probe along the endocardial surface of the right atrium from the anterior apex of the atriotomy incision to reach the tricuspid annulus at the 2:00 position. The atriotomy incision is closed with a 2 layer closure of running 4-0 Prolene suture.   Procedure Completion:  Epicardial pacing wires are fixed to the inferior wall of the right ventricule and to the right atrial appendage. The patient is rewarmed to 37C temperature. The left ventricular vent is removed.  The  patient is ventilated and flow volumes turndown while the mitral valve repair is inspected using transesophageal echocardiogram. The valve repair appears intact with no residual leak. The antegrade cardioplegia cannula is now removed. The patient is weaned and disconnected from cardiopulmonary bypass.  The patient's rhythm at separation from bypass was  junctional.  The patient was weaned from bypass without any inotropic support. Total cardiopulmonary bypass time for the operation was 216 minutes.  Followup transesophageal echocardiogram performed after separation from bypass revealed a well-seated annuloplasty ring in the mitral position with a normal functioning mitral valve. There was no residual leak.  Left ventricular function was unchanged from preoperatively.  The femoral arterial and venous cannulae were removed uneventfully. There was a palpable pulse in the distal right common femoral artery after removal of the cannula. Protamine was administered to reverse the anticoagulation. The right internal jugular cannula was removed and manual pressure held on the neck for 15 minutes.  Single lung ventilation was begun. The atriotomy closure was inspected for hemostasis. The pericardial sac was drained using a 28 French Bard drain placed through the anterior port incision.  The pericardium was closed using a patch of core matrix bovine submucosal tissue patch. The right pleural space is irrigated with saline solution and inspected for hemostasis. The right pleural space was drained using a 28 French Bard drain placed through the posterior port incision. The miniature thoracotomy incision was closed in multiple layers in routine fashion. The right groin incision was inspected for hemostasis and closed in multiple layers in routine fashion.  The post-bypass portion of the operation was notable for stable rhythm and hemodynamics.  No blood products were administered during the operation.   Disposition:  The patient tolerated the procedure well.  The patient was reintubated using a single lumen endotracheal tube and subsequently transported to the surgical intensive care unit in stable condition. There were no intraoperative complications. All sponge instrument and needle counts are verified correct at completion of the operation.    Valentina Gu.  Roxy Manns MD 11/19/2015 2:35 PM

## 2015-11-19 NOTE — Progress Notes (Signed)
  Echocardiogram Echocardiogram Transesophageal has been performed.  Bobbye Charleston 11/19/2015, 9:46 AM

## 2015-11-19 NOTE — Progress Notes (Signed)
Patient ID: [name], female   DOB: 11-12-1945, 70 y.o.   MRN: [id number]   SICU Evening Rounds:   Hemodynamically stable, AV paced at 80 CI = 2.1  Has started to wake up on vent.  Urine output good  CT output low  CBC    Component Value Date/Time   WBC 12.6* 11/19/2015 1500   RBC 3.19* 11/19/2015 1500   HGB 10.5* 11/19/2015 1509   HCT 31.0* 11/19/2015 1509   PLT 154 11/19/2015 1500   MCV 98.1 11/19/2015 1500   MCH 32.6 11/19/2015 1500   MCHC 33.2 11/19/2015 1500   RDW 13.0 11/19/2015 1500   LYMPHSABS 1.9 08/08/2015 1716   MONOABS 0.7 08/08/2015 1716   EOSABS 0.3 08/08/2015 1716   BASOSABS 0.1 08/08/2015 1716     BMET    Component Value Date/Time   NA 141 11/19/2015 1509   K 4.0 11/19/2015 1509   CL 104 11/19/2015 1351   CO2 16* 11/15/2015 1342   GLUCOSE 103* 11/19/2015 1509   BUN 9 11/19/2015 1351   CREATININE 0.50 11/19/2015 1351   CREATININE 1.00* 10/09/2015 1648   CALCIUM 9.7 11/15/2015 1342   GFRNONAA >60 11/15/2015 1342   GFRAA >60 11/15/2015 1342     A/P:  Stable postop course. Continue current plans

## 2015-11-19 NOTE — Brief Op Note (Signed)
11/19/2015  2:26 PM  PATIENT:  [name]  70 y.o. 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  MV Stenosis: No mitral valve stenosis.  MV Disease Functional Class: MV Disease Functional Class: Type II.  Etiology (Choose at least one and up to five): Degenerative.  MV Lesions (Choose at least one): Leaflet prolapse, bileaflet. and Elongated/ruptured chords(s).   Mitral/Tricuspid/Pulmonary Valve Procedure  Mitral Valve Procedure Performed:  Repair: Annuloplasty., Neochrods. Number of Neochords Inserted: 22 and Other.  Implant: Annuloplasty Device: Implant model number [id number], Size 34, Unique Device Identifier [id number].   Maze Procedure  Surgical Approach: Right mini thoracotomy  Cut-and-sew:  No.  Cryo: Yes  Cryo Lesions (select all that apply):        2   Box Lesion,     4  Posterior Mirtal Annular Line,     6  Mitral Valve Cryo Lesion,     9   Intercaval Line to Tricuspid Annulus ("T" Lesion) and    10  Tricuspid Cryo Lesion, Medial   16  Other - epicardial posterior AV groove and coronary sinus    Radiofrequency:  Yes.  Bipolar: Yes.  RF Lesions (select all that apply):   1 Right Pulmonary Veins  2   Box Lesion,  4. Posterior Mitral  Annular Line,  9   Intercaval Line to Tricuspid Annulus ("T" Lesion),    11  Intercaval Line and   15b  RAA Lateral Wall to "T" Lesion     Left Atrial Appendage Treatment:    Yes -  epicardial clip   COMPLICATIONS: None  BASELINE WEIGHT: 64.9 kg  PATIENT DISPOSITION:   TO SICU IN STABLE CONDITION  Rexene Alberts, MD 11/19/2015 2:26 PM

## 2015-11-20 ENCOUNTER — Inpatient Hospital Stay (HOSPITAL_COMMUNITY): Payer: Medicare Other

## 2015-11-20 LAB — CBC
HCT: 29.2 % — ABNORMAL LOW (ref 36.0–46.0)
HCT: 28.8 % — ABNORMAL LOW (ref 36.0–46.0)
HEMOGLOBIN: 9.3 g/dL — AB (ref 12.0–15.0)
Hemoglobin: 9.6 g/dL — ABNORMAL LOW (ref 12.0–15.0)
MCH: 33.1 pg (ref 26.0–34.0)
MCH: 32.7 pg (ref 26.0–34.0)
MCHC: 32.9 g/dL (ref 30.0–36.0)
MCHC: 32.3 g/dL (ref 30.0–36.0)
MCV: 100.7 fL — AB (ref 78.0–100.0)
MCV: 101.4 fL — AB (ref 78.0–100.0)
PLATELETS: 132 10*3/uL — AB (ref 150–400)
Platelets: 130 10*3/uL — ABNORMAL LOW (ref 150–400)
RBC: 2.9 MIL/uL — AB (ref 3.87–5.11)
RBC: 2.84 MIL/uL — ABNORMAL LOW (ref 3.87–5.11)
RDW: 13.2 % (ref 11.5–15.5)
RDW: 13.4 % (ref 11.5–15.5)
WBC: 9.9 10*3/uL (ref 4.0–10.5)
WBC: 12.1 10*3/uL — ABNORMAL HIGH (ref 4.0–10.5)

## 2015-11-20 LAB — POCT I-STAT, CHEM 8
BUN: 9 mg/dL (ref 6–20)
CALCIUM ION: 1.15 mmol/L (ref 1.13–1.30)
CREATININE: 0.7 mg/dL (ref 0.44–1.00)
Chloride: 102 mmol/L (ref 101–111)
GLUCOSE: 122 mg/dL — AB (ref 65–99)
HEMATOCRIT: 29 % — AB (ref 36.0–46.0)
Hemoglobin: 9.9 g/dL — ABNORMAL LOW (ref 12.0–15.0)
Potassium: 4 mmol/L (ref 3.5–5.1)
Sodium: 138 mmol/L (ref 135–145)
TCO2: 25 mmol/L (ref 0–100)

## 2015-11-20 LAB — CREATININE, SERUM
CREATININE: 0.84 mg/dL (ref 0.44–1.00)
GFR calc Af Amer: >60 mL/min (ref >60)
GFR calc non Af Amer: >60 mL/min (ref >60)

## 2015-11-20 LAB — POCT I-STAT 3, ART BLOOD GAS (G3+)
ACID-BASE DEFICIT: 3 mmol/L — AB (ref 0.0–2.0)
Bicarbonate: 23.1 mEq/L (ref 20.0–24.0)
O2 SAT: 92 %
PH ART: 7.337 — AB (ref 7.350–7.450)
TCO2: 24 mmol/L (ref 0–100)
pCO2 arterial: 43 mmHg (ref 35.0–45.0)
pO2, Arterial: 67 mmHg — ABNORMAL LOW (ref 80.0–100.0)

## 2015-11-20 LAB — MAGNESIUM
MAGNESIUM: 2.6 mg/dL — AB (ref 1.7–2.4)
Magnesium: 2.3 mg/dL (ref 1.7–2.4)

## 2015-11-20 LAB — GLUCOSE, CAPILLARY
GLUCOSE-CAPILLARY: 147 mg/dL — AB (ref 65–99)
GLUCOSE-CAPILLARY: 153 mg/dL — AB (ref 65–99)
Glucose-Capillary: 158 mg/dL — ABNORMAL HIGH (ref 65–99)
Glucose-Capillary: 136 mg/dL — ABNORMAL HIGH (ref 65–99)
Glucose-Capillary: 113 mg/dL — ABNORMAL HIGH (ref 65–99)
Glucose-Capillary: 140 mg/dL — ABNORMAL HIGH (ref 65–99)

## 2015-11-20 LAB — BASIC METABOLIC PANEL
Anion gap: 6 (ref 5–15)
BUN: 7 mg/dL (ref 6–20)
CALCIUM: 7.4 mg/dL — AB (ref 8.9–10.3)
CO2: 22 mmol/L (ref 22–32)
Chloride: 111 mmol/L (ref 101–111)
Creatinine, Ser: 0.71 mg/dL (ref 0.44–1.00)
GLUCOSE: 152 mg/dL — AB (ref 65–99)
POTASSIUM: 4 mmol/L (ref 3.5–5.1)
SODIUM: 139 mmol/L (ref 135–145)

## 2015-11-20 MED ORDER — FUROSEMIDE 10 MG/ML IJ SOLN
20.0000 mg | Freq: Once | INTRAMUSCULAR | Status: AC
  Administered 2015-11-20: 20 mg via INTRAVENOUS
  Filled 2015-11-20: qty 2

## 2015-11-20 MED ORDER — WARFARIN - PHYSICIAN DOSING INPATIENT
Freq: Every day | Status: DC
  Administered 2015-11-20 – 2015-11-24 (×3)

## 2015-11-20 MED ORDER — WARFARIN SODIUM 2.5 MG PO TABS
2.5000 mg | ORAL_TABLET | Freq: Every day | ORAL | Status: DC
  Administered 2015-11-20 – 2015-11-22 (×3): 2.5 mg via ORAL
  Filled 2015-11-20 (×3): qty 1

## 2015-11-20 MED ORDER — METOCLOPRAMIDE HCL 5 MG/ML IJ SOLN
10.0000 mg | Freq: Four times a day (QID) | INTRAMUSCULAR | Status: DC | PRN
  Administered 2015-11-20 (×3): 10 mg via INTRAVENOUS
  Filled 2015-11-20 (×3): qty 2

## 2015-11-20 MED ORDER — MORPHINE SULFATE (PF) 2 MG/ML IV SOLN: 1.0000 mg | INTRAVENOUS | Status: DC | PRN

## 2015-11-20 MED ORDER — POTASSIUM CHLORIDE 10 MEQ/50ML IV SOLN
10.0000 meq | INTRAVENOUS | Status: AC
  Administered 2015-11-20 (×2): 10 meq via INTRAVENOUS
  Filled 2015-11-20 (×2): qty 50

## 2015-11-20 MED FILL — Heparin Sodium (Porcine) Inj 1000 Unit/ML: INTRAMUSCULAR | Qty: 2500 | Status: AC

## 2015-11-20 NOTE — Progress Notes (Signed)
CT surgery p.m. Rounds  Patient had stable day mobilize out of bed Responded to diuresis P.m. labs reviewed and are satisfactory Continue current care

## 2015-11-20 NOTE — Progress Notes (Addendum)
      [address]       [place] [address]             [phone]        CARDIOTHORACIC SURGERY PROGRESS NOTE   R1 Day Post-Op Procedure(s) (LRB): MINIMALLY INVASIVE MITRAL VALVE REPAIR (MVR) (Right) MINIMALLY INVASIVE MAZE PROCEDURE (N/A) TRANSESOPHAGEAL ECHOCARDIOGRAM (TEE) (N/A) CLIPPING OF ATRIAL APPENDAGE (N/A)  Subjective: Feels nauseated.  Mild soreness right chest.  No SOB  Objective: Vital signs: BP Readings from Last 1 Encounters:  11/20/15 107/67   Pulse Readings from Last 1 Encounters:  11/20/15 69   Resp Readings from Last 1 Encounters:  11/20/15 12   Temp Readings from Last 1 Encounters:  11/20/15 98.1 F (36.7 C) Core (Comment)    Hemodynamics: PAP: (21-40)/(10-21) 38/18 mmHg CO:  [2.2 L/min-4.7 L/min] 4.3 L/min CI:  [1.2 L/min/m2-2.7 L/min/m2] 2.5 L/min/m2  Physical Exam:  Rhythm:   Junctional - AAI pacing  Breath sounds: clear  Heart sounds:  RRR w/out murmur  Incisions:  Dressings dry, intact  Abdomen:  Soft, non-distended, non-tender  Extremities:  Warm, well-perfused  Chest tubes:  decreasing volume thin serosanguinous output, no air leak    Intake/Output from previous day: 05/30 0701 - 05/31 0700 In: 6487.2 [P.O.:120; I.V.:5067.2; Blood:150; IV Piggyback:1150] Out: [id number] [Urine:3290; Blood:300; Chest Tube:620] Intake/Output this shift:    Lab Results:  CBC: Recent Labs  11/19/15 2100 11/20/15 0330  WBC 10.9* 9.9  HGB 9.7*  9.9* 9.6*  HCT 29.8*  29.0* 29.2*  PLT 148* 132*    BMET:  Recent Labs  11/19/15 2100 11/20/15 0330  NA 142 139  K 4.4 4.0  CL 109 111  CO2  --  22  GLUCOSE 130* 152*  BUN 11 7  CREATININE 0.75  0.50 0.71  CALCIUM  --  7.4*     PT/INR:   Recent Labs  11/19/15 1500  LABPROT 18.1*  INR 1.49    CBG (last 3)   Recent Labs  11/19/15 2006 11/19/15 2330 11/20/15 0325  GLUCAP 128* 147* 153*    ABG    Component Value Date/Time   PHART 7.337* 11/20/2015 0523   PCO2ART 43.0 11/20/2015 0523   PO2ART 67.0* 11/20/2015 0523   HCO3 23.1 11/20/2015 0523   TCO2 24 11/20/2015 0523   ACIDBASEDEF 3.0* 11/20/2015 0523   O2SAT 92.0 11/20/2015 0523    CXR: Bibasilar atelectasis  Assessment/Plan: S/P Procedure(s) (LRB): MINIMALLY INVASIVE MITRAL VALVE REPAIR (MVR) (Right) MINIMALLY INVASIVE MAZE PROCEDURE (N/A) TRANSESOPHAGEAL ECHOCARDIOGRAM (TEE) (N/A) CLIPPING OF ATRIAL APPENDAGE (N/A)  Overall doing well POD1 Maintaining AAI paced rhythm w/ stable hemodynamics, no drips Breathing comfortably w/ O2 sats 96-100% on 1 L/min via [place] Post-op nausea Expected post op acute blood loss anemia, Hgb stable 9.6 Expected post op atelectasis, mild Expected post op volume excess, mild   Continue AAI pacing and hold beta blockers for now  Mobilize  D/C lines  Gentle diuresis  Add Reglan   Start coumadin slowly   Rexene Alberts, MD 11/20/2015 8:09 AM

## 2015-11-21 ENCOUNTER — Inpatient Hospital Stay (HOSPITAL_COMMUNITY): Payer: Medicare Other

## 2015-11-21 ENCOUNTER — Encounter (HOSPITAL_COMMUNITY): Payer: Self-pay | Admitting: Thoracic Surgery (Cardiothoracic Vascular Surgery)

## 2015-11-21 LAB — CBC
HCT: 28.3 % — ABNORMAL LOW (ref 36.0–46.0)
HEMOGLOBIN: 9.1 g/dL — AB (ref 12.0–15.0)
MCH: 32.9 pg (ref 26.0–34.0)
MCHC: 32.2 g/dL (ref 30.0–36.0)
MCV: 102.2 fL — ABNORMAL HIGH (ref 78.0–100.0)
PLATELETS: 129 10*3/uL — AB (ref 150–400)
RBC: 2.77 MIL/uL — AB (ref 3.87–5.11)
RDW: 13.6 % (ref 11.5–15.5)
WBC: 10.7 10*3/uL — ABNORMAL HIGH (ref 4.0–10.5)

## 2015-11-21 LAB — GLUCOSE, CAPILLARY
Glucose-Capillary: 113 mg/dL — ABNORMAL HIGH (ref 65–99)
Glucose-Capillary: 127 mg/dL — ABNORMAL HIGH (ref 65–99)
Glucose-Capillary: 134 mg/dL — ABNORMAL HIGH (ref 65–99)

## 2015-11-21 LAB — BASIC METABOLIC PANEL
Anion gap: 6 (ref 5–15)
BUN: 10 mg/dL (ref 6–20)
CHLORIDE: 104 mmol/L (ref 101–111)
CO2: 25 mmol/L (ref 22–32)
Calcium: 8.2 mg/dL — ABNORMAL LOW (ref 8.9–10.3)
Creatinine, Ser: 1.01 mg/dL — ABNORMAL HIGH (ref 0.44–1.00)
GFR calc Af Amer: >60 mL/min (ref >60)
GFR calc non Af Amer: 56 mL/min — ABNORMAL LOW (ref >60)
GLUCOSE: 136 mg/dL — AB (ref 65–99)
POTASSIUM: 3.6 mmol/L (ref 3.5–5.1)
Sodium: 135 mmol/L (ref 135–145)

## 2015-11-21 LAB — PROTIME-INR
INR: 1.13 (ref 0.00–1.49)
Prothrombin Time: 14.7 seconds (ref 11.6–15.2)

## 2015-11-21 MED ORDER — SODIUM CHLORIDE 0.9% FLUSH: 3.0000 mL | INTRAVENOUS | Status: DC | PRN

## 2015-11-21 MED ORDER — ATORVASTATIN CALCIUM 20 MG PO TABS
20.0000 mg | ORAL_TABLET | Freq: Every day | ORAL | Status: DC
  Administered 2015-11-22 – 2015-11-23 (×2): 20 mg via ORAL
  Filled 2015-11-21 (×2): qty 1

## 2015-11-21 MED ORDER — POTASSIUM CHLORIDE 10 MEQ/50ML IV SOLN
10.0000 meq | INTRAVENOUS | Status: DC | PRN
  Filled 2015-11-21: qty 50

## 2015-11-21 MED ORDER — SODIUM CHLORIDE 0.9 % IV SOLN
250.0000 mL | INTRAVENOUS | Status: DC | PRN
  Administered 2015-11-21: 250 mL via INTRAVENOUS

## 2015-11-21 MED ORDER — FUROSEMIDE 10 MG/ML IJ SOLN
20.0000 mg | Freq: Four times a day (QID) | INTRAMUSCULAR | Status: AC
  Administered 2015-11-21 (×3): 20 mg via INTRAVENOUS
  Filled 2015-11-21 (×3): qty 2

## 2015-11-21 MED ORDER — KETOROLAC TROMETHAMINE 15 MG/ML IJ SOLN
15.0000 mg | Freq: Four times a day (QID) | INTRAMUSCULAR | Status: AC
  Administered 2015-11-21 – 2015-11-22 (×4): 15 mg via INTRAVENOUS
  Filled 2015-11-21 (×4): qty 1

## 2015-11-21 MED ORDER — POTASSIUM CHLORIDE 10 MEQ/50ML IV SOLN
10.0000 meq | INTRAVENOUS | Status: AC
  Administered 2015-11-21 (×5): 10 meq via INTRAVENOUS

## 2015-11-21 MED ORDER — AMIODARONE HCL 200 MG PO TABS
200.0000 mg | ORAL_TABLET | Freq: Two times a day (BID) | ORAL | Status: DC
  Administered 2015-11-22 – 2015-11-24 (×5): 200 mg via ORAL
  Filled 2015-11-21 (×5): qty 1

## 2015-11-21 MED ORDER — FUROSEMIDE 40 MG PO TABS
40.0000 mg | ORAL_TABLET | Freq: Two times a day (BID) | ORAL | Status: DC
  Administered 2015-11-22 – 2015-11-25 (×7): 40 mg via ORAL
  Filled 2015-11-21 (×7): qty 1

## 2015-11-21 MED ORDER — POTASSIUM CHLORIDE CRYS ER 20 MEQ PO TBCR: 20.0000 meq | EXTENDED_RELEASE_TABLET | ORAL | Status: DC | PRN

## 2015-11-21 MED ORDER — MOVING RIGHT ALONG BOOK
Freq: Once | Status: AC
  Administered 2015-11-21: 10:00:00
  Filled 2015-11-21: qty 1

## 2015-11-21 MED ORDER — POTASSIUM CHLORIDE CRYS ER 20 MEQ PO TBCR
20.0000 meq | EXTENDED_RELEASE_TABLET | Freq: Every day | ORAL | Status: DC
  Administered 2015-11-22: 20 meq via ORAL
  Filled 2015-11-21: qty 1

## 2015-11-21 MED ORDER — SODIUM CHLORIDE 0.9% FLUSH
3.0000 mL | Freq: Two times a day (BID) | INTRAVENOUS | Status: DC
  Administered 2015-11-21 – 2015-11-24 (×7): 3 mL via INTRAVENOUS

## 2015-11-21 MED ORDER — SODIUM CHLORIDE 0.9% FLUSH: 10.0000 mL | INTRAVENOUS | Status: DC | PRN

## 2015-11-21 NOTE — Progress Notes (Signed)
RN found pt up in room unattended wiping floor. When questioned pt stated she was wiping drainage from chest tube from floor. No spill or drainage noted on floor or towel. Pt reoriented and directed back to bed. Instructed to call for help when needed and not to get up without assistance. Pt verbalized understanding. Bed alarm and call bell in place. Will continue to monitor closely.  Raliegh Ip RN

## 2015-11-21 NOTE — Progress Notes (Signed)
Called to give report to RN receiving patient in 2W22.   Levon Hedger, RN

## 2015-11-21 NOTE — Progress Notes (Signed)
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  Hallucination last night - resolved fairly quickly.  Currently feels well.  Some soreness in chest.  No appetite but nausea improved.  Objective: Vital signs: BP Readings from Last 1 Encounters:  11/21/15 129/71   Pulse Readings from Last 1 Encounters:  11/21/15 69   Resp Readings from Last 1 Encounters:  11/21/15 20   Temp Readings from Last 1 Encounters:  11/21/15 98.1 F (36.7 C) Oral    Hemodynamics: PAP: (30)/(13) 30/13 mmHg CO:  [4.4 L/min] 4.4 L/min CI:  [2.5 L/min/m2] 2.5 L/min/m2  Physical Exam:  Rhythm:   Sinus 50's - AAI pacing  Breath sounds: clear  Heart sounds:  RRR  Incisions:  Dressing dry, intact  Abdomen:  Soft, non-distended, non-tender  Extremities:  Warm, well-perfused  Chest tubes:  decreasing volume thin serosanguinous output, no air leak    Intake/Output from previous day: 05/31 0701 - 06/01 0700 In: 1175 [P.O.:675; I.V.:300; IV Piggyback:200] Out: 2385 [Urine:1655; Chest Tube:730] Intake/Output this shift:    Lab Results:  CBC: Recent Labs  11/20/15 1700 11/20/15 1708 11/21/15 0440  WBC 12.1*  --  10.7*  HGB 9.3* 9.9* 9.1*  HCT 28.8* 29.0* 28.3*  PLT 130*  --  129*    BMET:  Recent Labs  11/20/15 0330  11/20/15 1708 11/21/15 0440  NA 139  --  138 135  K 4.0  --  4.0 3.6  CL 111  --  102 104  CO2 22  --   --  25  GLUCOSE 152*  --  122* 136*  BUN 7  --  9 10  CREATININE 0.71  < > 0.70 1.01*  CALCIUM 7.4*  --   --  8.2*  < > = values in this interval not displayed.   PT/INR:   Recent Labs  11/21/15 0440  LABPROT 14.7  INR 1.13    CBG (last 3)     Recent Labs  11/20/15 1948 11/21/15 0019 11/21/15 0350  GLUCAP 140* 127* 113*    ABG    Component Value Date/Time   PHART 7.337* 11/20/2015 0523   PCO2ART 43.0 11/20/2015 0523   PO2ART 67.0* 11/20/2015 0523   HCO3 23.1 11/20/2015 0523   TCO2 25 11/20/2015 1708   ACIDBASEDEF 3.0* 11/20/2015 0523   O2SAT 92.0 11/20/2015 0523    CXR: Looks good.  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) MINIMALLY INVASIVE MAZE PROCEDURE (N/A) TRANSESOPHAGEAL ECHOCARDIOGRAM (TEE) (N/A) CLIPPING OF ATRIAL APPENDAGE (N/A)  Overall doing quite well POD2 Maintaining NSR - AAI paced rhythm w/ stable BP Breathing comfortably w/ O2 sats 95-97% on O2 1 L/min via [place] Mild intermittent hallucinations, paranoid ideation, some anxiety Expected post op acute blood loss anemia, Hgb stable 9.1 Expected post op atelectasis, mild Chronic diastolic CHF with expected post-op volume excess, UOP marginal   Mobilize  Diuresis  Continue AAI pacing and hold beta blockers, amiodarone today  Leave chest tubes in until output decreases further  Add toradol for pain  Minimize narcotics  Transfer step down  Rexene Alberts, MD 11/21/2015 8:05 AM

## 2015-11-21 NOTE — Progress Notes (Signed)
Received phone call from Radiology about patients x-ray showing right pneumothorax. MD Roxy Manns at bedside and made aware. Chest tubes to stay in place today.  Levon Hedger, RN

## 2015-11-21 NOTE — Progress Notes (Signed)
Prior to patient being transfered patient went into afib-rate controlled 70s, Dr. Roxy Manns made aware.  Rowe Pavy, RN

## 2015-11-21 NOTE — Progress Notes (Signed)
Called 2W RN to give report, couldn't take report at this time. RN will return call when available.  Levon Hedger, RN

## 2015-11-21 NOTE — Progress Notes (Signed)
Pt transferred to 2W22 by ambulation with [facility] RN and Jerilee Hoh. Prior to transfer patient was A Fib on the monitor. Disconnected from pacer and taped wires. Notified Ship broker of change. On ambulation all VS remained stable. Pt on room air and 02 saturation was 98%. Pt chest tube connected to suction after arrival to 2W. Patients belongings sent and in room. Helene Kelp RN at bedside.  Levon Hedger, RN

## 2015-11-21 NOTE — Progress Notes (Addendum)
Pt. hallucinating a man staring at her outside of her room, pt has called this RN to room several times. Last pain medication documented 10 mg oxycodone given @ 2250, pt has also stated that she has not slept in about 3 days. Pt is able to answer orientation questions appropriately. Multiple attempts made to redirect pt that nobody is outside of her room by this RN and charge RN and that we are monitoring her closely and her safety is our priority. Pt continues to have hallucinations and is growing frustrated with staff. Will continue to monitor closely and contact MD if necessary.

## 2015-11-22 LAB — CBC
HEMATOCRIT: 28.5 % — AB (ref 36.0–46.0)
Hemoglobin: 9.2 g/dL — ABNORMAL LOW (ref 12.0–15.0)
MCH: 33.1 pg (ref 26.0–34.0)
MCHC: 32.3 g/dL (ref 30.0–36.0)
MCV: 102.5 fL — AB (ref 78.0–100.0)
Platelets: 120 10*3/uL — ABNORMAL LOW (ref 150–400)
RBC: 2.78 MIL/uL — ABNORMAL LOW (ref 3.87–5.11)
RDW: 13.4 % (ref 11.5–15.5)
WBC: 8.8 10*3/uL (ref 4.0–10.5)

## 2015-11-22 LAB — BASIC METABOLIC PANEL
Anion gap: 6 (ref 5–15)
BUN: 13 mg/dL (ref 6–20)
CALCIUM: 8.4 mg/dL — AB (ref 8.9–10.3)
CO2: 25 mmol/L (ref 22–32)
CREATININE: 0.88 mg/dL (ref 0.44–1.00)
Chloride: 102 mmol/L (ref 101–111)
GFR calc Af Amer: >60 mL/min (ref >60)
GFR calc non Af Amer: >60 mL/min (ref >60)
GLUCOSE: 128 mg/dL — AB (ref 65–99)
Potassium: 3.8 mmol/L (ref 3.5–5.1)
Sodium: 133 mmol/L — ABNORMAL LOW (ref 135–145)

## 2015-11-22 LAB — PROTIME-INR
INR: 1.14 (ref 0.00–1.49)
Prothrombin Time: 14.8 seconds (ref 11.6–15.2)

## 2015-11-22 NOTE — Progress Notes (Signed)
Removed three epicardial pacing wires.  No ectopy.  Patient tolerated procedure well.  Frequent vital signs taken and documented.  Will continue to monitor.

## 2015-11-22 NOTE — Progress Notes (Signed)
CARDIAC REHAB PHASE I   PRE:  Rate/Rhythm: 66 a fib  BP:  Sitting: 131/63        SaO2: 100 RA  MODE:  Ambulation: 490 ft   POST:  Rate/Rhythm: 75 a fib  BP:  Sitting: 111/86         SaO2: 98 RA  Pt ambulated 490 ft on RA, rolling walker, chest tube, IV, assist x1, slow, steady gait, tolerated well with no complaints. Encouraged additional ambulation x2 today, IS. Pt verbalized understanding. Pt to recliner after walk, call bell within reach. Will follow.   [id number] Lenna Sciara, RN, BSN 11/22/2015 11:53 AM

## 2015-11-22 NOTE — Care Management Important Message (Signed)
Important Message  Patient Details  Name: [name] MRN: [id number] Date of Birth: [birth year]   Medicare Important Message Given:  Yes    [name] 11/22/2015, 9:27 AM

## 2015-11-22 NOTE — Progress Notes (Addendum)
      [name] [address]       [place] [address]             [phone]      3 Days Post-Op Procedure(s) (LRB): MINIMALLY INVASIVE MITRAL VALVE REPAIR (MVR) (Right) MINIMALLY INVASIVE MAZE PROCEDURE (N/A) TRANSESOPHAGEAL ECHOCARDIOGRAM (TEE) (N/A) CLIPPING OF ATRIAL APPENDAGE (N/A)   Subjective:  States feels pretty good.  Per documentation some hallucinations and disorientation overnight.  No BM  Objective: Vital signs in last 24 hours: Temp:  [97.4 F (36.3 C)-98.7 F (37.1 C)] 98.7 F (37.1 C) (06/02 0601) Pulse Rate:  [61-83] 61 (06/02 0601) Cardiac Rhythm:  [-] Atrial fibrillation (06/02 0700) Resp:  [16-28] 16 (06/02 0601) BP: (101-146)/(58-80) 112/66 mmHg (06/02 0601) SpO2:  [94 %-98 %] 94 % (06/02 0601) Weight:  [155 lb (70.308 kg)] 155 lb (70.308 kg) (06/02 0601)  Intake/Output from previous day: 06/01 0701 - 06/02 0700 In: 723 [P.O.:440; I.V.:83; IV Piggyback:200] Out: 900 [Urine:780; Chest Tube:120] Intake/Output this shift: Total I/O In: -  Out: 50 [Chest Tube:50]  General appearance: alert, cooperative and no distress Heart: regular rate and rhythm Lungs: clear to auscultation bilaterally Abdomen: soft, non-tender; bowel sounds normal; no masses,  no organomegaly Extremities: edema trace Wound: clean and dry  Lab Results:  Recent Labs  11/21/15 0440 11/22/15 0644  WBC 10.7* 8.8  HGB 9.1* 9.2*  HCT 28.3* 28.5*  PLT 129* 120*   BMET:  Recent Labs  11/21/15 0440 11/22/15 0644  NA 135 133*  K 3.6 3.8  CL 104 102  CO2 25 25  GLUCOSE 136* 128*  BUN 10 13  CREATININE 1.01* 0.88  CALCIUM 8.2* 8.4*    PT/INR:  Recent Labs  11/22/15 0644  LABPROT 14.8  INR 1.14   ABG    Component Value Date/Time   PHART 7.337* 11/20/2015 0523   HCO3 23.1 11/20/2015 0523   TCO2 25 11/20/2015 1708   ACIDBASEDEF 3.0* 11/20/2015 0523   O2SAT 92.0 11/20/2015 0523   CBG (last 3)   Recent Labs  11/21/15 0019 11/21/15 0350  11/21/15 0831  GLUCAP 127* 113* 134*    Assessment/Plan: S/P Procedure(s) (LRB): MINIMALLY INVASIVE MITRAL VALVE REPAIR (MVR) (Right) MINIMALLY INVASIVE MAZE PROCEDURE (N/A) TRANSESOPHAGEAL ECHOCARDIOGRAM (TEE) (N/A) CLIPPING OF ATRIAL APPENDAGE (N/A)  1. CV- some A. Fib yesterday, currently in NSR- continue Amiodarone, mild HTN will watch and can start ACE if continues to creep up 2. Pulm- chest tube without air leak, 120 cc output yesterday- can possibly remove today 3. INR 1.14, continue coumadin at 2.5 mg, if no bump will increase to 5 mg tomorrow 4. Renal- creatinine WNL, mild hypervolemia, continue Lasix 5. Post op hallucinations, mild continue to limit narcotics 6. Dispo- patient stable, some A. Fib, currently NSR, will d/c EPW, possibly remove chest tubes today, continue current care   LOS: 3 days    BARRETT, ERIN 11/22/2015  I have seen and examined the patient and agree with the assessment and plan as outlined.  Currently in rate-controlled Afib.  Looks and feels much better.  Eating regular food.  Ambulating well.  Okay to d/c pacing wires but chest tubes still draining too much for removal.  Rexene Alberts, MD 11/22/2015 9:01 AM

## 2015-11-22 NOTE — Discharge Instructions (Addendum)
Information on my medicine - Coumadin   (Warfarin)  This medication education was reviewed with me or my healthcare representative as part of my discharge preparation.  The pharmacist that spoke with me during my hospital stay was:  Dareen Piano, [facility]  Why was Coumadin prescribed for you? Coumadin was prescribed for you because you have a blood clot or a medical condition that can cause an increased risk of forming blood clots. Blood clots can cause serious health problems by blocking the flow of blood to the heart, lung, or brain. Coumadin can prevent harmful blood clots from forming. As a reminder your indication for Coumadin is:   Blood Clot Prevention After Heart Valve Surgery  What test will check on my response to Coumadin? While on Coumadin (warfarin) you will need to have an INR test regularly to ensure that your dose is keeping you in the desired range. The INR (international normalized ratio) number is calculated from the result of the laboratory test called prothrombin time (PT).  If an INR APPOINTMENT HAS NOT ALREADY BEEN MADE FOR YOU please schedule an appointment to have this lab work done by your health care provider within 7 days. Your INR goal is usually a number between:  2 to 3 or your provider may give you a more narrow range like 2-2.5.  Ask your health care provider during an office visit what your goal INR is.  What  do you need to  know  About  COUMADIN? Take Coumadin (warfarin) exactly as prescribed by your healthcare provider about the same time each day.  DO NOT stop taking without talking to the doctor who prescribed the medication.  Stopping without other blood clot prevention medication to take the place of Coumadin may increase your risk of developing a new clot or stroke.  Get refills before you run out.  What do you do if you miss a dose? If you miss a dose, take it as soon as you remember on the same day then continue your regularly scheduled regimen the  next day.  Do not take two doses of Coumadin at the same time.  Important Safety Information A possible side effect of Coumadin (Warfarin) is an increased risk of bleeding. You should call your healthcare provider right away if you experience any of the following: ? Bleeding from an injury or your nose that does not stop. ? Unusual colored urine (red or dark brown) or unusual colored stools (red or black). ? Unusual bruising for unknown reasons. ? A serious fall or if you hit your head (even if there is no bleeding).  Some foods or medicines interact with Coumadin (warfarin) and might alter your response to warfarin. To help avoid this: ? Eat a balanced diet, maintaining a consistent amount of Vitamin K. ? Notify your provider about major diet changes you plan to make. ? Avoid alcohol or limit your intake to 1 drink for women and 2 drinks for men per day. (1 drink is 5 oz. wine, 12 oz. beer, or 1.5 oz. liquor.)  Make sure that ANY health care provider who prescribes medication for you knows that you are taking Coumadin (warfarin).  Also make sure the healthcare provider who is monitoring your Coumadin knows when you have started a new medication including herbals and non-prescription products.  Coumadin (Warfarin)  Major Drug Interactions  Increased Warfarin Effect Decreased Warfarin Effect  Alcohol (large quantities) Antibiotics (esp. Septra/Bactrim, Flagyl, Cipro) Amiodarone (Cordarone) Aspirin (ASA) Cimetidine (Tagamet) Megestrol (Megace) NSAIDs (  ibuprofen, naproxen, etc.) Piroxicam (Feldene) Propafenone (Rythmol SR) Propranolol (Inderal) Isoniazid (INH) Posaconazole (Noxafil) Barbiturates (Phenobarbital) Carbamazepine (Tegretol) Chlordiazepoxide (Librium) Cholestyramine (Questran) Griseofulvin Oral Contraceptives Rifampin Sucralfate (Carafate) Vitamin K   Coumadin (Warfarin) Major Herbal Interactions  Increased Warfarin Effect Decreased Warfarin Effect   Garlic Ginseng Ginkgo biloba Coenzyme Q10 Green tea St. Johns wort    Coumadin (Warfarin) FOOD Interactions  Eat a consistent number of servings per week of foods HIGH in Vitamin K (1 serving =  cup)  Collards (cooked, or boiled & drained) Kale (cooked, or boiled & drained) Mustard greens (cooked, or boiled & drained) Parsley *serving size only =  cup Spinach (cooked, or boiled & drained) Swiss chard (cooked, or boiled & drained) Turnip greens (cooked, or boiled & drained)  Eat a consistent number of servings per week of foods MEDIUM-HIGH in Vitamin K (1 serving = 1 cup)  Asparagus (cooked, or boiled & drained) Broccoli (cooked, boiled & drained, or raw & chopped) Brussel sprouts (cooked, or boiled & drained) *serving size only =  cup Lettuce, raw (green leaf, endive, romaine) Spinach, raw Turnip greens, raw & chopped   These websites have more information on Coumadin (warfarin):  FailFactory.se; VeganReport.com.au;  Activity: 1.May walk up steps                2.No lifting more than ten pounds for two weeks.                 3.No driving for two weeks.                4.Stop any activity that causes chest pain, shortness of breath, dizziness,  sweating or excessive weakness.                5.Avoid straining.                6.Continue with your breathing exercises daily.  Diet: Low fat, Low cholesterol diet  Wound Care: May shower.  Clean wounds with mild soap and water daily. Contact the office at [phone] if any problems arise.

## 2015-11-22 NOTE — Progress Notes (Signed)
Removed left introducer.  Held pressure for ten minutes.  Applied occlusive dressing and patient on bed rest for 30 minutes along with an hour bed rest for epicardial pacing wire removal.

## 2015-11-22 NOTE — Progress Notes (Signed)
Total chest tube output this shift was 286 mls

## 2015-11-22 NOTE — Progress Notes (Signed)
Changed [place] chest tube drainage container.  Charted 140 ml prior to canister change.  All new drainage will be in addition to this amount. Attempted to milk drainage tubes as there was some clots lodged in the tubing. Pt resting with call bell within reach.  Will continue to monitor. Payton Emerald, RN

## 2015-11-22 NOTE — Discharge Summary (Signed)
Physician Discharge Summary       [address]       [place] [address]             [phone]    Patient ID: OCEANE STREHLE MRN: [id number] DOB/AGE: May 02, 1946 70 y.o.  Admit date: 11/19/2015 Discharge date: 11/25/2015  Admission Diagnoses: 1. Severe mitral regurgitation 2. Recurrent paroxysmal atrial fibrillation  Active Diagnoses:  1. Chronic insomnia 2.  MVP (mitral valve prolapse) 3. Skin cancer of arm(squamous) 4. Osteopenia 5. Tobacco abuse 6. ABL anemia 7. Thrombocytopenia   Procedure (s):   Minimally-Invasive Mitral Valve Repair Complex valvuloplasty including artificial Gore-tex neochord placement x 22 Closure of cleft in posterior leaflet Edwards Physio II Ring Annuloplasty (size 40mm, model #5200, serial [id number])   Minimally-Invasive Maze Procedure Complete bilateral atrial lesion set using cryothermy and bipolar radiofrequency ablation Clipping of Left Atrial Appendage (Atricure left atrial clip, size 40 mm) by Dr. Roxy Manns on 11/19/2015.  History of Presenting Illness:* Patient is a 70 year old female with long-standing history of mitral valve prolapse and mitral regurgitation with been referred for surgical consultation to discuss treatment options for management of severe symptomatic primary mitral regurgitation with recurrent paroxysmal atrial fibrillation. The patient states that she was noted to have a heart murmur during her youth and has been followed intermittently ever since with known history of mitral valve prolapse. Several years ago she moved to [place]. In May 2012 she was hospitalized with shortness of breath and atypical chest discomfort. She ruled out for myocardial infarction but echocardiogram revealed bileaflet prolapse with at least moderate mitral regurgitation and normal left ventricular systolic function. She was referred to Dr. Aundra Dubin who performed  transesophageal echocardiogram and exercise treadmill test. The patient was found to have moderate to severe mitral regurgitation but normal exercise tolerance without signs of symptomatic mitral regurgitation. She has been followed carefully ever since. The patient has remained physically active and exercises on a regular basis. In May 2016 the patient developed paroxysmal atrial fibrillation. She was started on Toprol-XL and Eliquis. Symptoms of intermittent palpitations have persisted ever since. Several months ago she began to experience shortness of breath with more strenuous activity. Follow-up echocardiogram performed 10/04/2015 revealed progression in the severity of mitral regurgitation with preserved left ventricular systolic function. She was seen in follow-up by Dr. Benjamine Mola and subsequently underwent TEE and diagnostic cardiac catheterization on 10/21/2015. TEE confirmed the presence of bileaflet prolapse of the mitral valve with severe mitral regurgitation. Left ventricular size and systolic function remains normal. There was moderate left atrial enlargement. Catheterization revealed moderate nonobstructive coronary artery disease with normal pulmonary artery pressures. [facility] surgical consultation was requested.  The patient is married and lives locally in [place] with her husband. She has remained physically active all of her adult life. She enjoys playing tennis and other aerobic exercise activity. She typically goes to the gym nearly every day and frequently walks 5 miles at a time. Last 2 or 3 months she has begun to experience exertional shortness of breath. This typically only occurs with more strenuous activity. She has never had any resting shortness of breath, PND, orthopnea, or lower extremity edema. Episodes of shortness of breath associated with mild "discomfort" in her chest. She has frequent palpitations. She has never had any dizzy spells or syncope. This past week  she has developed some mild nasal congestion without productive cough or fever. She suspects that she may have a "cold"  Patient returns to the office today for  follow-up of stage D severe symptomatic primary mitral regurgitation with recurrent paroxysmal atrial fibrillation. She was originally seen in consultation on 11/04/2015 and she returns to the office today with tentative plans to proceed with elective mitral valve repair and Maze procedure next week. Approximately 10 days ago she was seen by her primary care physician with complaints of sinus congestion and nasal discharge. She was given a prescription for Augmentin which she has nearly completed. She states that her symptoms have resolved completely and she now feels back to her baseline. She denies any history of fevers or chills. Her nasal congestion has resolved. She has not had any significant cough. She reports no other problems or complaints.   Patient has stage D severe symptomatic primary mitral regurgitation with recurrent paroxysmal atrial fibrillation. She presents with recent onset symptoms of exertional shortness of breath consistent with chronic diastolic congestive heart failure, New York Heart Association functional class 1-2. 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 The patient and her husband were again counseled at length regarding the indications, risks and potential benefits of mitral valve repair. The rationale for elective surgery has been explained, including a comparison between surgery and continued medical therapy with close follow-up. Patient agreed to proceed with surgery. Pre operative carotid duplex US showed no significant internal carotid artery stenosis bilaterally. She was admitted on 11/19/2015 in order to undergo a minimally invasive MAZE and mitral valve repair.   Brief Hospital Course:  The patient was extubated the evening of surgery without  difficulty. He remained afebrile and hemodynamically stable. She was initially AAI paced. Gordy Councilman, a line, chest tubes, and foley were removed early in the post operative course. She was not started on a BB initially post op secondary to bradycardia. She went into a fib and was put on Amiodarone. She converted to sinus rhythm.  She was volume over loaded and diuresed. She had ABL anemia. She did not require a post op transfusion. Her last H and H was 9.2. She also had mild thrombocytopenia. Her last platelet count was up to 120. She was weaned off the insulin drip. The patient's HGA1C pre op was 5.6. The patient was felt surgically stable for transfer from the ICU to [facility] for further convalescence on 11/22/2015. She continues to progress with cardiac rehab. She was ambulating on room air. He/she has been tolerating a diet and has had a bowel movement. Epicardial pacing wires and chest tube sutures will be removed prior to discharge. The patient is felt surgically stable for discharge today.   Latest Vital Signs: Blood pressure 132/64, pulse 68, temperature 98.2 F (36.8 C), temperature source Oral, resp. rate 18, height 5\' 7"  (1.702 m), weight 143 lb 8.3 oz (65.1 kg), last menstrual period 06/22/1977, SpO2 100 %.  Physical Exam: General appearance: alert, cooperative and no distress Heart: regular rate and rhythm Lungs: clear to auscultation bilaterally Abdomen: soft, non-tender; bowel sounds normal; no masses, no organomegaly Extremities: edema trace Wound: clean and dry  Discharge Condition:Stable and discharged to home.  Recent laboratory studies:  Lab Results  Component Value Date   WBC 8.8 11/22/2015   HGB 9.2* 11/22/2015   HCT 28.5* 11/22/2015   MCV 102.5* 11/22/2015   PLT 120* 11/22/2015   Lab Results  Component Value Date   NA 141 11/24/2015   K 4.1 11/24/2015   CL 104 11/24/2015   CO2 31 11/24/2015   CREATININE 0.75 11/24/2015   GLUCOSE 110* 11/24/2015  Diagnostic  Studies:  Dg Chest Port 1 View  11/21/2015  CLINICAL DATA:  Mitral valve repair. EXAM: PORTABLE CHEST 1 VIEW COMPARISON:  11/20/2015. FINDINGS: Right chest tubes in stable position. Tiny right apical pneumothorax cannot be excluded. Interval removal of Swan-Ganz catheter. Left IJ sheath in stable position . Mediastinum and hilar structures are normal. Prior cardiac valve replacement. Left atrial appendage clip in stable position. Heart size stable. Low lung volumes with mild bibasilar atelectasis. Small bilateral pleural effusions. IMPRESSION: 1. Right chest tubes in stable position. Tiny right apical pneumothorax cannot be excluded on today's exam. 2. Interim removal Swan-Ganz catheter, left IJ sheath in stable position. 3. Low lung volumes with mild bibasilar atelectasis. Tiny bilateral pleural effusions cannot be excluded . Critical Value/emergent results were called by telephone at the time of interpretation on 11/21/2015 at 8:06 am to nurse [facility], who verbally acknowledged these results. Electronically Signed   By: Marcello Moores  Register   On: 11/21/2015 08:09    Ct Angio Chest Aorta W/cm &/or Wo/cm  11/04/2015  CLINICAL DATA:  Aortic aneurysm without rupture. EXAM: CT ANGIOGRAPHY CHEST, ABDOMEN AND PELVIS TECHNIQUE: Multidetector CT imaging through the chest, abdomen and pelvis was performed using the standard protocol during bolus administration of intravenous contrast. Multiplanar reconstructed images and MIPs were obtained and reviewed to evaluate the vascular anatomy. CONTRAST:  100 mL of Isovue 370 intravenously. COMPARISON:  CT scan of November 30, 2013. FINDINGS: CTA CHEST FINDINGS No pneumothorax or pleural effusion is noted. 14 x 10 mm nodular density is noted posteriorly in left lower lobe which is not significantly changed compared to prior exam. Stable 15 x 14 mm left retro hilar density is noted as well which is unchanged. These most likely represent benign lesions given the lack of change. Minimal  bilateral posterior basilar subsegmental atelectasis is noted. There is no evidence of thoracic aortic dissection or aneurysm. Great vessels are widely patent at the origins, although most of the vessels are not included in the field-of-view on this study and therefore cannot be evaluated. No mediastinal mass or adenopathy is noted. Mild coronary artery calcifications are noted. No significant osseous abnormality is noted. Review of the MIP images confirms the above findings. CTA ABDOMEN AND PELVIS FINDINGS No gallstones are noted. The liver, spleen and pancreas unremarkable. Adrenal glands appear normal. No hydronephrosis or renal obstruction is noted. 7.4 cm cyst is seen involving the lower pole of the left kidney with peripheral calcification. Atherosclerosis of abdominal aorta is noted without aneurysm or dissection. The mesenteric and renal arteries are widely patent without significant stenosis. Iliac arteries are widely patent without significant stenosis. There is no evidence of bowel obstruction. No abnormal fluid collection is noted. Urinary bladder appears normal. Status post hysterectomy and appendectomy. Multilevel degenerative disc disease is noted in the thoracic spine. Review of the MIP images confirms the above findings. IMPRESSION: No evidence of thoracic or abdominal aortic aneurysm or dissection. Mild coronary artery calcifications are noted suggesting coronary artery disease. Stable left lower lobe nodular densities are noted compared to prior exam. These can be considered benign at this point with no further follow-up required. 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 Electronically Signed   By: Marijo Conception, M.D.   On: 11/04/2015 18:42   Ct Angio Abd/pel W/ And/or W/o  11/04/2015  CLINICAL DATA:  Aortic aneurysm without rupture. EXAM: CT ANGIOGRAPHY CHEST, ABDOMEN AND PELVIS TECHNIQUE: Multidetector CT  imaging through the chest, abdomen and pelvis was performed using the standard protocol during bolus administration of intravenous contrast. Multiplanar reconstructed images and MIPs were obtained and reviewed to evaluate the vascular anatomy. CONTRAST:  100 mL of Isovue 370 intravenously. COMPARISON:  CT scan of November 30, 2013. FINDINGS: CTA CHEST FINDINGS No pneumothorax or pleural effusion is noted. 14 x 10 mm nodular density is noted posteriorly in left lower lobe which is not significantly changed compared to prior exam. Stable 15 x 14 mm left retro hilar density is noted as well which is unchanged. These most likely represent benign lesions given the lack of change. Minimal bilateral posterior basilar subsegmental atelectasis is noted. There is no evidence of thoracic aortic dissection or aneurysm. Great vessels are widely patent at the origins, although most of the vessels are not included in the field-of-view on this study and therefore cannot be evaluated. No mediastinal mass or adenopathy is noted. Mild coronary artery calcifications are noted. No significant osseous abnormality is noted. Review of the MIP images confirms the above findings. CTA ABDOMEN AND PELVIS FINDINGS No gallstones are noted. The liver, spleen and pancreas unremarkable. Adrenal glands appear normal. No hydronephrosis or renal obstruction is noted. 7.4 cm cyst is seen involving the lower pole of the left kidney with peripheral calcification. Atherosclerosis of abdominal aorta is noted without aneurysm or dissection. The mesenteric and renal arteries are widely patent without significant stenosis. Iliac arteries are widely patent without significant stenosis. There is no evidence of bowel obstruction. No abnormal fluid collection is noted. Urinary bladder appears normal. Status post hysterectomy and appendectomy. Multilevel degenerative disc disease is noted in the thoracic spine. Review of the MIP images confirms the above findings.  IMPRESSION: No evidence of thoracic or abdominal aortic aneurysm or dissection. Mild coronary artery calcifications are noted suggesting coronary artery disease. Stable left lower lobe nodular densities are noted compared to prior exam. These can be considered benign at this point with no further follow-up required. 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 Electronically Signed   By: Marijo Conception, M.D.   On: 11/04/2015 18:42       Discharge Instructions    Amb Referral to Cardiac Rehabilitation    Complete by:  As directed   Diagnosis:  Valve Repair  Valve:  Mitral          Discharge Medications:   Medication List    STOP taking these medications        amoxicillin-clavulanate 875-125 MG tablet  Commonly known as:  AUGMENTIN     ELIQUIS 5 MG Tabs tablet  Generic drug:  apixaban      TAKE these medications        acetaminophen 500 MG tablet  Commonly known as:  TYLENOL  Take 500 mg by mouth every 6 (six) hours as needed for mild pain or headache.     amiodarone 200 MG tablet  Commonly known as:  PACERONE  Take 1 tablet (200 mg total) by mouth daily.     aspirin 325 MG EC tablet  Take 1 tablet (325 mg total) by mouth daily.     atorvastatin 20 MG tablet  Commonly known as:  LIPITOR  take 1 tablet by mouth daily     fluticasone 50 MCG/ACT nasal spray  Commonly known as:  FLONASE  Place 1 spray into both nostrils daily as needed for allergies or rhinitis.  furosemide 40 MG tablet  Commonly known as:  LASIX  Take 1 tablet (40 mg total) by mouth daily. For 7 Days     hydroxypropyl methylcellulose / hypromellose 2.5 % ophthalmic solution  Commonly known as:  ISOPTO TEARS / GONIOVISC  Place 1 drop into both eyes as needed for dry eyes.     metoprolol succinate 25 MG 24 hr tablet  Commonly known as:  TOPROL-XL  Take 1 tablet (25 mg total) by mouth daily.     nystatin powder    Generic drug:  nystatin  Apply 1 application topically daily as needed.     oxyCODONE 5 MG immediate release tablet  Commonly known as:  Oxy IR/ROXICODONE  Take 1-2 tablets (5-10 mg total) by mouth every 3 (three) hours as needed for severe pain.     potassium chloride SA 20 MEQ tablet  Commonly known as:  K-DUR,KLOR-CON  Take 1 tablet (20 mEq total) by mouth daily. For 7 Days     warfarin 4 MG tablet  Commonly known as:  COUMADIN  Take 1 tablet (4 mg total) by mouth daily at 6 PM.     zolpidem 10 MG tablet  Commonly known as:  AMBIEN  TAKE 1 TABLET BY MOUTH AT BEDTIME AS NEEDED FOR SLEEP       The patient has been discharged on:   1.Beta Blocker:  Yes [   ]                              No   [   ]                              If No, reason:  2.Ace Inhibitor/ARB: Yes [   ]                                     No  [    ]                                     If No, reason:  3.Statin:   Yes [  x ]                  No  [   ]                  If No, reason:  4.Ecasa:  Yes  [  x ]                  No   [   ]                  If No, reason:  Follow Up Appointments: Follow-up Information    Follow up with Rexene Alberts, MD.   Specialty:  Cardiothoracic Surgery   Why:  PA/LAT CXR to be taken (at [facility] which is in the same building as Dr. Guy Sandifer office) one hour prior to office appointment;Office will mail or call with appointment date and time   Contact information:   [address] [place] [address] [phone]       Follow up with [name], MD On 12/17/2015.   Specialty:  Cardiology  Why:  Appointment time is at 1:30   Contact information:   [address] [place] [address] [phone]       Schedule an appointment as soon as possible for a visit with [facility].   Specialty:  Cardiology   Why:  will need PT/INR drawn on Wednesday, please contact office to set up appointment    Contact information:   [address] [place] [phone]      Signed: Cinda Quest 11/25/2015, 2:05 PM

## 2015-11-23 ENCOUNTER — Inpatient Hospital Stay (HOSPITAL_COMMUNITY): Payer: Medicare Other

## 2015-11-23 LAB — BASIC METABOLIC PANEL
ANION GAP: 9 (ref 5–15)
BUN: 9 mg/dL (ref 6–20)
CO2: 27 mmol/L (ref 22–32)
Calcium: 8.3 mg/dL — ABNORMAL LOW (ref 8.9–10.3)
Chloride: 102 mmol/L (ref 101–111)
Creatinine, Ser: 0.69 mg/dL (ref 0.44–1.00)
Glucose, Bld: 110 mg/dL — ABNORMAL HIGH (ref 65–99)
POTASSIUM: 3.2 mmol/L — AB (ref 3.5–5.1)
SODIUM: 138 mmol/L (ref 135–145)

## 2015-11-23 LAB — PROTIME-INR
INR: 1.2 (ref 0.00–1.49)
PROTHROMBIN TIME: 15.4 s — AB (ref 11.6–15.2)

## 2015-11-23 MED ORDER — POTASSIUM CHLORIDE CRYS ER 20 MEQ PO TBCR
40.0000 meq | EXTENDED_RELEASE_TABLET | Freq: Every day | ORAL | Status: DC
  Administered 2015-11-23 – 2015-11-25 (×3): 40 meq via ORAL
  Filled 2015-11-23 (×3): qty 2

## 2015-11-23 MED ORDER — WARFARIN SODIUM 4 MG PO TABS
4.0000 mg | ORAL_TABLET | Freq: Every day | ORAL | Status: DC
  Administered 2015-11-23 – 2015-11-24 (×2): 4 mg via ORAL
  Filled 2015-11-23 (×2): qty 1

## 2015-11-23 MED ORDER — POTASSIUM CHLORIDE CRYS ER 20 MEQ PO TBCR
40.0000 meq | EXTENDED_RELEASE_TABLET | Freq: Once | ORAL | Status: AC
  Administered 2015-11-23: 40 meq via ORAL
  Filled 2015-11-23: qty 2

## 2015-11-23 NOTE — Progress Notes (Signed)
CARDIAC REHAB PHASE I   PRE:  Rate/Rhythm: 66 sinus rhythm, first degree AV block   BP:  Supine:   Sitting: 107/56  Standing:     SaO2: 98% ra   MODE:  Ambulation: 400 ft   POST:  Rate/Rhythem: 72 sinus, first degree AV block  BP:  Supine:    Sitting: 114/65  Standing:     SaO2: 98% ra  1300-1335  Pt ambulated in hallway x1 assist using rolling walker, chest tube in place. Slow steady gait.  Pt returned to bed, call light in reach.  Education completed including sternal precautions , low fat-low cholesterol diet, exercise, and medication compliance.  Pt oriented to outpatient cardiac rehab.  At pt request, referral will be sent to [facility].  Understanding verbalized   [employer]

## 2015-11-23 NOTE — Progress Notes (Addendum)
      [address]       [place] [address]             [phone]      4 Days Post-Op Procedure(s) (LRB): MINIMALLY INVASIVE MITRAL VALVE REPAIR (MVR) (Right) MINIMALLY INVASIVE MAZE PROCEDURE (N/A) TRANSESOPHAGEAL ECHOCARDIOGRAM (TEE) (N/A) CLIPPING OF ATRIAL APPENDAGE (N/A)   Subjective:  No new complaints.  Continues to feel pretty good.  Objective: Vital signs in last 24 hours: Temp:  [97.8 F (36.6 C)-98.7 F (37.1 C)] 98 F (36.7 C) (06/03 0447) Pulse Rate:  [57-69] 68 (06/03 0447) Cardiac Rhythm:  [-] Heart block (06/03 0714) Resp:  [17-20] 18 (06/03 0447) BP: (112-133)/(55-78) 131/61 mmHg (06/03 0447) SpO2:  [96 %] 96 % (06/03 0447) Weight:  [150 lb 4.8 oz (68.176 kg)] 150 lb 4.8 oz (68.176 kg) (06/03 0447)  Intake/Output from previous day: 06/02 0701 - 06/03 0700 In: 720 [P.O.:720] Out: 1456 [Urine:1000; Chest Tube:456]  General appearance: alert, cooperative and no distress Heart: regular rate and rhythm Lungs: clear to auscultation bilaterally Abdomen: soft, non-tender; bowel sounds normal; no masses,  no organomegaly Extremities: edema trace Wound: clean and dry  Lab Results:  Recent Labs  11/21/15 0440 11/22/15 0644  WBC 10.7* 8.8  HGB 9.1* 9.2*  HCT 28.3* 28.5*  PLT 129* 120*   BMET:  Recent Labs  11/22/15 0644 11/23/15 0626  NA 133* 138  K 3.8 3.2*  CL 102 102  CO2 25 27  GLUCOSE 128* 110*  BUN 13 9  CREATININE 0.88 0.69  CALCIUM 8.4* 8.3*    PT/INR:  Recent Labs  11/23/15 0626  LABPROT 15.4*  INR 1.20   ABG    Component Value Date/Time   PHART 7.337* 11/20/2015 0523   HCO3 23.1 11/20/2015 0523   TCO2 25 11/20/2015 1708   ACIDBASEDEF 3.0* 11/20/2015 0523   O2SAT 92.0 11/20/2015 0523   CBG (last 3)   Recent Labs  11/21/15 0019 11/21/15 0350 11/21/15 0831  GLUCAP 127* 113* 134*    Assessment/Plan: S/P Procedure(s) (LRB): MINIMALLY INVASIVE MITRAL VALVE REPAIR (MVR) (Right) MINIMALLY  INVASIVE MAZE PROCEDURE (N/A) TRANSESOPHAGEAL ECHOCARDIOGRAM (TEE) (N/A) CLIPPING OF ATRIAL APPENDAGE (N/A)  1. CV- Episodes of A. Fib overnight, currently NSR with 1st degree block- on Amiodarone 2. Pulm- no acute issues, chest tube >400 cc output yesterday, will leave in place today 3. INR 1.20, will increase Coumadin to 4 mg daily 4. Renal- creatinine remains stable, mild hypokalemia, continue Lasix, will increase potassium supplementation 5. Dsipo- patient stable, continues to have some A. Fib, currently NSR, increase Coumadin, leave chest tube today, supplement potassium   LOS: 4 days    BARRETT, ERIN 11/23/2015   I have seen and examined the patient and agree with the assessment and plan as outlined.  Leave chest tube in for now.  Patient still fluid overloaded - chest tube drainage will resolve as fluid overload resolves.  Continue lasix BID.  Otherwise doing well.  Rexene Alberts, MD 11/23/2015 12:13 PM

## 2015-11-24 ENCOUNTER — Inpatient Hospital Stay (HOSPITAL_COMMUNITY): Payer: Medicare Other

## 2015-11-24 LAB — BASIC METABOLIC PANEL
ANION GAP: 6 (ref 5–15)
BUN: 7 mg/dL (ref 6–20)
CALCIUM: 8.7 mg/dL — AB (ref 8.9–10.3)
CHLORIDE: 104 mmol/L (ref 101–111)
CO2: 31 mmol/L (ref 22–32)
Creatinine, Ser: 0.75 mg/dL (ref 0.44–1.00)
GFR calc non Af Amer: >60 mL/min (ref >60)
GLUCOSE: 110 mg/dL — AB (ref 65–99)
POTASSIUM: 4.1 mmol/L (ref 3.5–5.1)
Sodium: 141 mmol/L (ref 135–145)

## 2015-11-24 LAB — PROTIME-INR
INR: 1.35 (ref 0.00–1.49)
PROTHROMBIN TIME: 16.8 s — AB (ref 11.6–15.2)

## 2015-11-24 MED ORDER — AMIODARONE HCL 200 MG PO TABS
200.0000 mg | ORAL_TABLET | Freq: Every day | ORAL | Status: DC
  Administered 2015-11-25: 200 mg via ORAL
  Filled 2015-11-24: qty 1

## 2015-11-24 MED ORDER — ATORVASTATIN CALCIUM 20 MG PO TABS
20.0000 mg | ORAL_TABLET | Freq: Every day | ORAL | Status: DC
  Administered 2015-11-24: 20 mg via ORAL
  Filled 2015-11-24: qty 1

## 2015-11-24 MED ORDER — METOPROLOL SUCCINATE ER 25 MG PO TB24
12.5000 mg | ORAL_TABLET | Freq: Two times a day (BID) | ORAL | Status: DC
  Administered 2015-11-24 – 2015-11-25 (×2): 12.5 mg via ORAL
  Filled 2015-11-24 (×2): qty 1

## 2015-11-24 MED ORDER — ZOLPIDEM TARTRATE 5 MG PO TABS
5.0000 mg | ORAL_TABLET | Freq: Every evening | ORAL | Status: DC | PRN
  Administered 2015-11-24: 5 mg via ORAL
  Filled 2015-11-24: qty 1

## 2015-11-24 NOTE — Progress Notes (Addendum)
      [address]       [place] [address]             [phone]      5 Days Post-Op Procedure(s) (LRB): MINIMALLY INVASIVE MITRAL VALVE REPAIR (MVR) (Right) MINIMALLY INVASIVE MAZE PROCEDURE (N/A) TRANSESOPHAGEAL ECHOCARDIOGRAM (TEE) (N/A) CLIPPING OF ATRIAL APPENDAGE (N/A)   Subjective:  Ms. Gillogly is doing okay.  She does have some issues with constipation.  She also requests her home Lorrin Mais be restarted.  Objective: Vital signs in last 24 hours: Temp:  [98.1 F (36.7 C)-98.6 F (37 C)] 98.1 F (36.7 C) (06/04 0434) Pulse Rate:  [65-66] 66 (06/04 0434) Cardiac Rhythm:  [-] Normal sinus rhythm (06/03 1900) Resp:  [18] 18 (06/04 0434) BP: (109-136)/(52-64) 136/64 mmHg (06/04 0434) SpO2:  [98 %-99 %] 98 % (06/04 0434) Weight:  [147 lb 8 oz (66.906 kg)] 147 lb 8 oz (66.906 kg) (06/04 0434)  Intake/Output from previous day: 06/03 0701 - 06/04 0700 In: 539 [P.O.:539] Out: 220 [Chest Tube:220]  General appearance: alert, cooperative and no distress Heart: regular rate and rhythm Lungs: clear to auscultation bilaterally Abdomen: soft, non-tender; bowel sounds normal; no masses,  no organomegaly Extremities: edema trace Wound: clean and dry  Lab Results:  Recent Labs  11/22/15 0644  WBC 8.8  HGB 9.2*  HCT 28.5*  PLT 120*   BMET:  Recent Labs  11/23/15 0626 11/24/15 0300  NA 138 141  K 3.2* 4.1  CL 102 104  CO2 27 31  GLUCOSE 110* 110*  BUN 9 7  CREATININE 0.69 0.75  CALCIUM 8.3* 8.7*    PT/INR:  Recent Labs  11/24/15 0300  LABPROT 16.8*  INR 1.35   ABG    Component Value Date/Time   PHART 7.337* 11/20/2015 0523   HCO3 23.1 11/20/2015 0523   TCO2 25 11/20/2015 1708   ACIDBASEDEF 3.0* 11/20/2015 0523   O2SAT 92.0 11/20/2015 0523   CBG (last 3)   Recent Labs  11/21/15 0831  GLUCAP 134*    Assessment/Plan: S/P Procedure(s) (LRB): MINIMALLY INVASIVE MITRAL VALVE REPAIR (MVR) (Right) MINIMALLY INVASIVE MAZE  PROCEDURE (N/A) TRANSESOPHAGEAL ECHOCARDIOGRAM (TEE) (N/A) CLIPPING OF ATRIAL APPENDAGE (N/A)  1. CV- NSR- continue Amiodarone 2. Pulm- no acute issues continue IS, chest tube output was 220 yesterday, will leave in place again today 3. INR 1.35, continue coumadin at 4 mg daily 4. Renal- creatinine stable,continue Lasix, Hypokalemia improved 5. Dispo- patient stable, maintaining NSR, continue chest tube for now, diurese   LOS: 5 days    BARRETT, ERIN 11/24/2015  I have seen and examined the patient and agree with the assessment and plan as outlined.  Will leave chest tube in one more day but likely remove them tomorrow.  Possible d/c home tomorrow afternoon.  Will decrease amiodarone to 200 mg/day and restart Toprol XL at reduced dose.  Rexene Alberts, MD 11/24/2015 2:27 PM

## 2015-11-25 ENCOUNTER — Inpatient Hospital Stay (HOSPITAL_COMMUNITY): Payer: Medicare Other

## 2015-11-25 LAB — PROTIME-INR
INR: 1.53 — ABNORMAL HIGH (ref 0.00–1.49)
Prothrombin Time: 18.5 seconds — ABNORMAL HIGH (ref 11.6–15.2)

## 2015-11-25 MED ORDER — ASPIRIN 325 MG PO TBEC: 325.0000 mg | DELAYED_RELEASE_TABLET | Freq: Every day | ORAL | Status: DC

## 2015-11-25 MED ORDER — POTASSIUM CHLORIDE CRYS ER 20 MEQ PO TBCR: 20.0000 meq | EXTENDED_RELEASE_TABLET | Freq: Every day | ORAL | Status: DC

## 2015-11-25 MED ORDER — LACTULOSE 10 GM/15ML PO SOLN
20.0000 g | Freq: Every day | ORAL | Status: DC | PRN
  Administered 2015-11-25: 20 g via ORAL
  Filled 2015-11-25: qty 30

## 2015-11-25 MED ORDER — METOPROLOL SUCCINATE ER 25 MG PO TB24: 25.0000 mg | ORAL_TABLET | Freq: Every day | ORAL | Status: DC

## 2015-11-25 MED ORDER — OXYCODONE HCL 5 MG PO TABS: 5.0000 mg | ORAL_TABLET | ORAL | Status: DC | PRN

## 2015-11-25 MED ORDER — WARFARIN SODIUM 4 MG PO TABS: 4.0000 mg | ORAL_TABLET | Freq: Every day | ORAL | Status: DC

## 2015-11-25 MED ORDER — AMIODARONE HCL 200 MG PO TABS: 200.0000 mg | ORAL_TABLET | Freq: Every day | ORAL | Status: DC

## 2015-11-25 MED ORDER — FUROSEMIDE 40 MG PO TABS: 40.0000 mg | ORAL_TABLET | Freq: Every day | ORAL | Status: DC

## 2015-11-25 NOTE — Progress Notes (Signed)
CT and OnQ pump removed at 1200 PM. Pt tolerated well. Awaiting CXR.   Fritz Pickerel, RN

## 2015-11-25 NOTE — Progress Notes (Signed)
Utilization review completed.  

## 2015-11-25 NOTE — Progress Notes (Addendum)
      [address]       [place] [address]             [phone]      6 Days Post-Op Procedure(s) (LRB): MINIMALLY INVASIVE MITRAL VALVE REPAIR (MVR) (Right) MINIMALLY INVASIVE MAZE PROCEDURE (N/A) TRANSESOPHAGEAL ECHOCARDIOGRAM (TEE) (N/A) CLIPPING OF ATRIAL APPENDAGE (N/A)   Subjective:  [name] complains of upset stomach.   She has not yet moved her bowels and feels like she needs to.  + ambulation  Objective: Vital signs in last 24 hours: Temp:  [97.7 F (36.5 C)-98.2 F (36.8 C)] 98.2 F (36.8 C) (06/05 0551) Pulse Rate:  [62-68] 62 (06/05 0551) Cardiac Rhythm:  [-] Normal sinus rhythm (06/04 1900) Resp:  [18-20] 18 (06/05 0551) BP: (114-132)/(55-64) 132/64 mmHg (06/05 0551) SpO2:  [98 %-100 %] 100 % (06/05 0551) Weight:  [143 lb 8.3 oz (65.1 kg)] 143 lb 8.3 oz (65.1 kg) (06/05 0551)  Intake/Output from previous day: 06/04 0701 - 06/05 0700 In: 120 [P.O.:120] Out: 150 [Chest Tube:150]  General appearance: alert, cooperative and no distress Heart: regular rate and rhythm Lungs: clear to auscultation bilaterally Abdomen: soft, non-tender; bowel sounds normal; no masses,  no organomegaly Extremities: edema trace Wound: clean and dry  Lab Results: No results for input(s): WBC, HGB, HCT, PLT in the last 72 hours. BMET:  Recent Labs  11/23/15 0626 11/24/15 0300  NA 138 141  K 3.2* 4.1  CL 102 104  CO2 27 31  GLUCOSE 110* 110*  BUN 9 7  CREATININE 0.69 0.75  CALCIUM 8.3* 8.7*    PT/INR:  Recent Labs  11/25/15 0411  LABPROT 18.5*  INR 1.53*   ABG    Component Value Date/Time   PHART 7.337* 11/20/2015 0523   HCO3 23.1 11/20/2015 0523   TCO2 25 11/20/2015 1708   ACIDBASEDEF 3.0* 11/20/2015 0523   O2SAT 92.0 11/20/2015 0523   CBG (last 3)  No results for input(s): GLUCAP in the last 72 hours.  Assessment/Plan: S/P Procedure(s) (LRB): MINIMALLY INVASIVE MITRAL VALVE REPAIR (MVR) (Right) MINIMALLY INVASIVE MAZE PROCEDURE  (N/A) TRANSESOPHAGEAL ECHOCARDIOGRAM (TEE) (N/A) CLIPPING OF ATRIAL APPENDAGE (N/A)  1. CV- NSR- continue Amiodarone, started on Lopressor yesterday 2. Pulm- off oxygen, chest tube output 170 cc output yesterday... Can likely d/c chest tube today 3. INR 1.53, trending up, continue coumadin at 4 mg daily 4. Renal- hypervolemia, improved, weight is at baseline, continue Lasix 5. GI- lactulose for constipation 6. Dispo-patient stable, chest tube with 170 cc output yesterday, will d/c today, continue coumadin, possibly d/c later today   LOS: 6 days    Liu, April 11/25/2015  I have seen and examined the patient and agree with the assessment and plan as outlined.  D/C chest tubes.  Tentatively plan D/C home later today if CXR okay and patient has bowel movement and/or abdominal discomfort has resolved.  April Alberts, MD 11/25/2015 8:32 AM

## 2015-11-25 NOTE — Progress Notes (Signed)
Discussed with the patient and her husband and all questioned fully answered. She will call me if any problems arise. Given paper prescriptions. IV removed, CCMD notified.   Discharge instructions included wound care, medications, and follow up appointments.   Fritz Pickerel, RN

## 2015-11-25 NOTE — Progress Notes (Signed)
CARDIAC REHAB PHASE I   Offered to walk with pt, pt declines ambulation at this time, states she just took a laxative and would prefer to wait. Pt states she is ambulating independently, states she has no questions regarding discharge education, hopes to go home later today. Will follow up as schedule permits.   Lenna Sciara, RN, BSN 11/25/2015 9:23 AM

## 2015-11-25 NOTE — Care Management Important Message (Signed)
Important Message  Patient Details  Name: [name] MRN: [id number] Date of Birth: 1945/08/23   Medicare Important Message Given:  Yes    Nathen May 11/25/2015, 11:57 AM

## 2015-11-26 LAB — TYPE AND SCREEN
ABO/RH(D): O POS
Antibody Screen: NEGATIVE
UNIT DIVISION: 0
UNIT DIVISION: 0
Unit division: 0
Unit division: 0

## 2015-11-27 ENCOUNTER — Ambulatory Visit (INDEPENDENT_AMBULATORY_CARE_PROVIDER_SITE_OTHER): Payer: Medicare Other | Admitting: *Deleted

## 2015-11-27 DIAGNOSIS — Z9889 Other specified postprocedural states: Secondary | ICD-10-CM | POA: Diagnosis not present

## 2015-11-27 DIAGNOSIS — I480 Paroxysmal atrial fibrillation: Secondary | ICD-10-CM

## 2015-11-27 DIAGNOSIS — Z8679 Personal history of other diseases of the circulatory system: Secondary | ICD-10-CM | POA: Diagnosis not present

## 2015-11-27 DIAGNOSIS — Z5181 Encounter for therapeutic drug level monitoring: Secondary | ICD-10-CM | POA: Insufficient documentation

## 2015-11-27 LAB — POCT INR: INR: 3.2

## 2015-11-27 NOTE — Patient Instructions (Signed)

## 2015-12-04 ENCOUNTER — Other Ambulatory Visit: Payer: Self-pay | Admitting: Thoracic Surgery (Cardiothoracic Vascular Surgery)

## 2015-12-04 ENCOUNTER — Ambulatory Visit (INDEPENDENT_AMBULATORY_CARE_PROVIDER_SITE_OTHER): Payer: Medicare Other | Admitting: *Deleted

## 2015-12-04 ENCOUNTER — Other Ambulatory Visit: Payer: Self-pay | Admitting: Family Medicine

## 2015-12-04 DIAGNOSIS — Z8679 Personal history of other diseases of the circulatory system: Secondary | ICD-10-CM

## 2015-12-04 DIAGNOSIS — Z9889 Other specified postprocedural states: Secondary | ICD-10-CM

## 2015-12-04 DIAGNOSIS — I480 Paroxysmal atrial fibrillation: Secondary | ICD-10-CM | POA: Diagnosis not present

## 2015-12-04 DIAGNOSIS — Z5181 Encounter for therapeutic drug level monitoring: Secondary | ICD-10-CM

## 2015-12-04 LAB — POCT INR: INR: 4.7

## 2015-12-04 NOTE — Telephone Encounter (Signed)
Refill OK but caution her about using with pain medication (following her recent heart surgery).

## 2015-12-09 ENCOUNTER — Telehealth: Payer: Self-pay | Admitting: Cardiology

## 2015-12-09 ENCOUNTER — Ambulatory Visit: Payer: Medicare Other | Admitting: Thoracic Surgery (Cardiothoracic Vascular Surgery)

## 2015-12-09 ENCOUNTER — Encounter: Payer: Self-pay | Admitting: Thoracic Surgery (Cardiothoracic Vascular Surgery)

## 2015-12-09 ENCOUNTER — Ambulatory Visit (INDEPENDENT_AMBULATORY_CARE_PROVIDER_SITE_OTHER): Payer: Self-pay | Admitting: Thoracic Surgery (Cardiothoracic Vascular Surgery)

## 2015-12-09 ENCOUNTER — Ambulatory Visit
Admission: RE | Admit: 2015-12-09 | Discharge: 2015-12-09 | Disposition: A | Discharge status: Home / Self Care | Payer: Medicare Other | Source: Ambulatory Visit | Attending: Thoracic Surgery (Cardiothoracic Vascular Surgery) | Admitting: Thoracic Surgery (Cardiothoracic Vascular Surgery)

## 2015-12-09 VITALS — BP 110/76 | HR 90 | Resp 20 | Ht 67.0 in | Wt 143.0 lb

## 2015-12-09 DIAGNOSIS — Z9889 Other specified postprocedural states: Secondary | ICD-10-CM

## 2015-12-09 DIAGNOSIS — Z952 Presence of prosthetic heart valve: Secondary | ICD-10-CM | POA: Diagnosis not present

## 2015-12-09 DIAGNOSIS — Z8679 Personal history of other diseases of the circulatory system: Secondary | ICD-10-CM

## 2015-12-09 DIAGNOSIS — I340 Nonrheumatic mitral (valve) insufficiency: Secondary | ICD-10-CM

## 2015-12-09 DIAGNOSIS — I480 Paroxysmal atrial fibrillation: Secondary | ICD-10-CM

## 2015-12-09 DIAGNOSIS — I341 Nonrheumatic mitral (valve) prolapse: Secondary | ICD-10-CM

## 2015-12-09 NOTE — Patient Instructions (Signed)
Continue all previous medications without any changes at this time  You may continue to gradually increase your physical activity as tolerated.  Refrain from any heavy lifting or strenuous use of your arms and shoulders until at least 8 weeks from the time of your surgery, and avoid activities that cause increased pain in your chest on the side of your surgical incision.  Otherwise you may continue to increase activities without any particular limitations.  Increase the intensity and duration of physical activity gradually.  You are encouraged to enroll and participate in the outpatient cardiac rehab program beginning as soon as practical.

## 2015-12-09 NOTE — Telephone Encounter (Signed)
Per Sally--okay to take together, both can cause drowsiness/sleepiness so monitor for increased drowsiness.  Pt advised.

## 2015-12-09 NOTE — Progress Notes (Signed)
[address]       [place] [address]             [phone]     CARDIOTHORACIC SURGERY OFFICE NOTE  Referring Provider is Larey Dresser, MD PCP is [name], MD   HPI:  Patient returns to the office today for routine follow-up status post minimally invasive mitral valve repair and maze procedure on 11/19/2015 for severe symptomatic primary mitral regurgitation and recurrent paroxysmal atrial fibrillation.  The patient's postoperative recovery in the hospital was uneventful and she was discharged home on the sixth postoperative day.  Since hospital discharge she had her prothrombin time checked at the Coumadin clinic on 12/04/2015. Her INR was high at 4.7 at that time and her Coumadin dose was adjusted appropriately. She returns to our office for routine follow-up today. She complains that she still has soreness in the right side of her chest. However, she has not taken any sort of pain relievers since hospital discharge other than Tylenol. Her appetite is gradually improved. She is walking 3 times every day. She still gets short of breath and tired easily. She is not sleeping much at night. He reports a slight dry nonproductive cough.   Current Outpatient Prescriptions  Medication Sig Dispense Refill  . acetaminophen (TYLENOL) 500 MG tablet Take 500 mg by mouth every 6 (six) hours as needed for mild pain or headache.    Marland Kitchen amiodarone (PACERONE) 200 MG tablet Take 1 tablet (200 mg total) by mouth daily. 30 tablet 1  . aspirin EC 325 MG EC tablet Take 1 tablet (325 mg total) by mouth daily. 30 tablet 0  . atorvastatin (LIPITOR) 20 MG tablet take 1 tablet by mouth daily  6  . fluticasone (FLONASE) 50 MCG/ACT nasal spray Place 1 spray into both nostrils daily as needed for allergies or rhinitis.    . hydroxypropyl methylcellulose / hypromellose (ISOPTO TEARS / GONIOVISC) 2.5 % ophthalmic solution Place 1 drop into both eyes as needed for dry eyes.    .  metoprolol succinate (TOPROL-XL) 25 MG 24 hr tablet Take 1 tablet (25 mg total) by mouth daily. 30 tablet 3  . nystatin (MYCOSTATIN/NYSTOP) 100000 UNIT/GM POWD Apply 1 application topically daily as needed.  2  . warfarin (COUMADIN) 4 MG tablet Take 1 tablet (4 mg total) by mouth daily at 6 PM. 30 tablet 3  . zolpidem (AMBIEN) 10 MG tablet TAKE 1 TABLET BY MOUTH AT BEDTIME AS NEEDED FOR SLEEP 30 tablet 0   No current facility-administered medications for this visit.   Facility-Administered Medications Ordered in Other Visits  Medication Dose Route Frequency Provider Last Rate Last Dose  . glutaraldehyde 0.625% soaking solution   Topical PRN Rexene Alberts, MD          Physical Exam:   BP 110/76 mmHg  Pulse 90  Resp 20  Ht 5\' 7"  (1.702 m)  Wt 143 lb (64.864 kg)  BMI 22.39 kg/m2  SpO2 96%  LMP 06/22/1977  General:  Well appearing  Chest:   Clear to auscultation  CV:   Regular rate and rhythm without murmur  Incisions:  Healing nicely  Abdomen:  Soft and nontender  Extremities:  Warm and well perfused, no lower extremity edema   Diagnostic Tests:  2 channel telemetry rhythm strip demonstrates normal sinus rhythm   CHEST 2 VIEW  COMPARISON: 11/25/2015  FINDINGS: Cardiomediastinal silhouette is stable. Again noted status post mitral valve repair. Stable bilateral  basilar atelectasis or scarring. No definite superimposed infiltrate. No pulmonary edema. Left atrial appendage clip is unchanged in position. Mild degenerative changes mid thoracic spine. No pneumothorax.  IMPRESSION: Again noted status post mitral valve repair. Stable bilateral basilar atelectasis or scarring. No definite superimposed infiltrate. No pulmonary edema. Left atrial appendage clip is unchanged in position.   Electronically Signed  By: Lahoma Crocker M.D.  On: 12/09/2015 09:59    Impression:  Patient is doing well approximately 2-1/2 weeks status post minimally invasive mitral  valve repair.   Plan:  I have encouraged the patient to continue to gradually increase her physical activity as tolerated. I suggested that she might try using tramadol intermittently when the pain in her chest is more bothersome, particularly at night when she is trying to sleep. I've also suggested that for a short-term period of time use of nonsteroidal anti-inflammatory medications such as ibuprofen or Naprosyn might also help. I think she may resume driving an automobile. I have encouraged her to go ahead and enroll in the outpatient cardiac rehabilitation program. We have not recommended any changes to her current medications at this time. However, I would plan to stop amiodarone in approximately 3-4 weeks if her rhythm continues to remain stable. The patient will return to our office in approximately 6 weeks for routine follow-up.   [name], MD 12/09/2015 11:30 AM

## 2015-12-09 NOTE — Telephone Encounter (Signed)
Pt saw surgeon today and was given rx for Tramadol -pt takes Ambien at night-is this ok to take --pls call [phone]

## 2015-12-10 ENCOUNTER — Encounter: Payer: Self-pay | Admitting: *Deleted

## 2015-12-10 NOTE — Progress Notes (Signed)
Patient ID: [name], female   DOB: 10-01-1945, 70 y.o.   MRN: [id number] A referral was faxed to [facility] to contact [name] to begin Phase II per the request of Dr. Darylene Price.

## 2015-12-11 ENCOUNTER — Ambulatory Visit (INDEPENDENT_AMBULATORY_CARE_PROVIDER_SITE_OTHER): Payer: Medicare Other | Admitting: *Deleted

## 2015-12-11 ENCOUNTER — Telehealth: Payer: Self-pay | Admitting: Cardiology

## 2015-12-11 DIAGNOSIS — Z8679 Personal history of other diseases of the circulatory system: Secondary | ICD-10-CM | POA: Diagnosis not present

## 2015-12-11 DIAGNOSIS — M7989 Other specified soft tissue disorders: Secondary | ICD-10-CM

## 2015-12-11 DIAGNOSIS — Z9889 Other specified postprocedural states: Secondary | ICD-10-CM | POA: Diagnosis not present

## 2015-12-11 DIAGNOSIS — I480 Paroxysmal atrial fibrillation: Secondary | ICD-10-CM | POA: Diagnosis not present

## 2015-12-11 DIAGNOSIS — Z5181 Encounter for therapeutic drug level monitoring: Secondary | ICD-10-CM

## 2015-12-11 LAB — POCT INR: INR: 3.8

## 2015-12-11 NOTE — Telephone Encounter (Signed)
Needs to cut back on sodium in diet if extremities are swelling.

## 2015-12-11 NOTE — Telephone Encounter (Signed)
Called patient about her question about her hands and feet swelling in the afternoons. Informed patient that it is not uncommon for people's feet to have some swelling in afternoons. Patient stated that the swelling goes down at night and doesn't come back until the afternoon each day. Patient recently started adding salt to her diet, informed patient that this might be causing some of her swelling as well. Patient recently saw Dr. Roxy Manns. Patient stated Dr. Roxy Manns is aware, and advised her to follow-up with Dr. Aundra Dubin. Patient has an appointment with Dr. Aundra Dubin in less than a week. Encouraged patient to elevate her feet for the swelling and to gradually increase her activity as Dr. Roxy Manns instructed. Will forward to Dr. Aundra Dubin so he is aware.

## 2015-12-11 NOTE — Telephone Encounter (Signed)
New message     Pt c/o swelling: STAT is pt has developed SOB within 24 hours  1. How long have you been experiencing swelling? For while according to the pt   2. Where is the swelling located? Hands and feet  3.  Are you currently taking a "fluid pill"?no  4.  Are you currently SOB? no  5.  Have you traveled recently?no   The pt just had surgery about three weeks ago

## 2015-12-12 ENCOUNTER — Telehealth: Payer: Self-pay | Admitting: Family Medicine

## 2015-12-12 ENCOUNTER — Other Ambulatory Visit: Payer: Self-pay | Admitting: Family Medicine

## 2015-12-12 ENCOUNTER — Telehealth: Payer: Self-pay | Admitting: Cardiology

## 2015-12-12 ENCOUNTER — Encounter: Payer: Self-pay | Admitting: Family Medicine

## 2015-12-12 ENCOUNTER — Ambulatory Visit (HOSPITAL_COMMUNITY)
Admission: RE | Admit: 2015-12-12 | Discharge: 2015-12-12 | Disposition: A | Discharge status: Home / Self Care | Payer: Medicare Other | Source: Ambulatory Visit | Attending: Cardiovascular Disease | Admitting: Cardiovascular Disease

## 2015-12-12 ENCOUNTER — Ambulatory Visit (INDEPENDENT_AMBULATORY_CARE_PROVIDER_SITE_OTHER): Payer: Medicare Other | Admitting: Family Medicine

## 2015-12-12 VITALS — BP 110/70 | HR 56 | Temp 97.8°F | Resp 12 | Ht 67.0 in | Wt 142.0 lb

## 2015-12-12 DIAGNOSIS — R2232 Localized swelling, mass and lump, left upper limb: Secondary | ICD-10-CM

## 2015-12-12 DIAGNOSIS — M7989 Other specified soft tissue disorders: Secondary | ICD-10-CM

## 2015-12-12 DIAGNOSIS — S40022A Contusion of left upper arm, initial encounter: Secondary | ICD-10-CM

## 2015-12-12 DIAGNOSIS — Z7901 Long term (current) use of anticoagulants: Secondary | ICD-10-CM | POA: Diagnosis not present

## 2015-12-12 DIAGNOSIS — X58XXXA Exposure to other specified factors, initial encounter: Secondary | ICD-10-CM | POA: Diagnosis not present

## 2015-12-12 LAB — POCT INR: INR: 3.8

## 2015-12-12 NOTE — Telephone Encounter (Signed)
PT AWARE OF  RECOMMENDATIONS  AND  STATES  HAS ANOTHER  PROBLEM.PT BELIEVES  MAY  HAVE BLOOD CLOT  TO LEFT UPPER ARM  HAS  HAD THESE  BEFORE,   NOTES  SWELLING,HOT  TO TOUCH, AND  PAINFUL.PER  PT   WAS  TOLD TO HOLD   WARFARIN  LAST NIGHT  DUE TO INR OF  3.8  PT  STATES  HAD  BLOOD CLOT  WHILE ON ELIQUIS   WILL FORWARD  TO DR  [facility] FOR  REVIEW .Adonis Housekeeper

## 2015-12-12 NOTE — Telephone Encounter (Signed)
Spoke to pt and gave her the INR result of 3.8. Pt does have an appt with a Coumadin clinic on Tuesday the 27th, to have it rechecked at her [place] office.

## 2015-12-12 NOTE — Telephone Encounter (Signed)
SEE OTHER PHONE NOTE./CY 

## 2015-12-12 NOTE — Telephone Encounter (Signed)
Spoke with pt and informed her of orders per Dr. Aundra Dubin. Pt verbalized understanding and was in agreement with this plan.

## 2015-12-12 NOTE — Telephone Encounter (Signed)
Sounds more likely phlebitis or cellulitis. Can get venous US of upper arm to rule out clot but doubt with INR > 3.  Will need to see PCP for evaluation of skin/soft tissue infection.

## 2015-12-12 NOTE — Telephone Encounter (Signed)
Pt's husband calling re swollen spot close to shoulder worried it's a blood clot-was in yesterday for Coumadin check-  pls advise [phone]

## 2015-12-12 NOTE — Progress Notes (Signed)
HPI:   [name] is a 70 y.o. female, who is here today complaining of "knot" on the left arm she noted today morning.  She recently had  mitral valve repair and maze procedure, 11/19/2015 for severe symptomatic primary mitral regurgitation and recurrent paroxysmal atrial fibrillation. She is reporting no complications after the procedure, she has wounds that are healing well on right chest wall, still having soreness but slowly getting better.Last f/u with vascular was on 12/09/15.  Area of concern on left arm is achy,  hot, and mildly erythematosus. Pain is exacerbated by left shoulder movement and alleviated by rest. She states that pain "is not bad", no limitation of joint range of motion, no distal left upper extremity cyanosis, or focal weakness.  She denies any injury on left arm, she is currently on Coumadin and according to patient, her INR was 3.8 yesterday so Coumadin was help yesterday and dose was adjusted. She denies any abnormal bleeding. She has not noted fever, chills, exertional chest pain, cough,dyspnea, wheezing, palpitations, dizziness, abdominal pain, nausea, vomiting, numbness, tingling, or unusual headache.  She is afraid of these being a "blood clot", she has not noted lower extremity edema, no prior history of DVT.    Review of Systems  Constitutional: Negative for fever, activity change, appetite change, fatigue and unexpected weight change.  HENT: Negative for facial swelling, nosebleeds, sore throat and trouble swallowing.   Eyes: Negative for pain, redness and visual disturbance.  Respiratory: Negative for cough, shortness of breath and wheezing.   Cardiovascular: Negative for chest pain, palpitations and leg swelling.  Gastrointestinal: Negative for nausea, vomiting, abdominal pain and blood in stool.       No changes in bowel habits.  Genitourinary: Negative for dysuria and hematuria.  Musculoskeletal: Negative for myalgias and gait problem.    Skin: Negative for color change, rash and wound.  Neurological: Negative for dizziness, syncope, weakness, numbness and headaches.  Hematological: Negative for adenopathy. Bruises/bleeds easily.  Psychiatric/Behavioral: Negative for confusion. The patient is not nervous/anxious.       Current Outpatient Prescriptions on File Prior to Visit  Medication Sig Dispense Refill  . acetaminophen (TYLENOL) 500 MG tablet Take 500 mg by mouth every 6 (six) hours as needed for mild pain or headache.    Marland Kitchen amiodarone (PACERONE) 200 MG tablet Take 1 tablet (200 mg total) by mouth daily. 30 tablet 1  . aspirin EC 325 MG EC tablet Take 1 tablet (325 mg total) by mouth daily. 30 tablet 0  . atorvastatin (LIPITOR) 20 MG tablet take 1 tablet by mouth daily  6  . fluticasone (FLONASE) 50 MCG/ACT nasal spray Place 1 spray into both nostrils daily as needed for allergies or rhinitis.    . hydroxypropyl methylcellulose / hypromellose (ISOPTO TEARS / GONIOVISC) 2.5 % ophthalmic solution Place 1 drop into both eyes as needed for dry eyes.    . metoprolol succinate (TOPROL-XL) 25 MG 24 hr tablet Take 1 tablet (25 mg total) by mouth daily. 30 tablet 3  . nystatin (MYCOSTATIN/NYSTOP) 100000 UNIT/GM POWD Apply 1 application topically daily as needed.  2  . warfarin (COUMADIN) 4 MG tablet Take 1 tablet (4 mg total) by mouth daily at 6 PM. 30 tablet 3  . zolpidem (AMBIEN) 10 MG tablet TAKE 1 TABLET BY MOUTH AT BEDTIME AS NEEDED FOR SLEEP 30 tablet 0   Current Facility-Administered Medications on File Prior to Visit  Medication Dose Route Frequency Provider Last Rate Last  Dose  . glutaraldehyde 0.625% soaking solution   Topical PRN Rexene Alberts, MD         Past Medical History  Diagnosis Date  . INSOMNIA, CHRONIC 03/29/2009  . Skin cancer of arm     Squamous  . MVP (mitral valve prolapse)   . Osteopenia 01/2014    T score -1.8 FRAX 8.8%/1.3%  . Heart murmur   . PONV (postoperative nausea and vomiting)   .  Prepatellar bursitis     RT KNEE  . History of melanoma excision   . History of palpitations   . Mitral regurgitation     severe by TEE  . Paroxysmal atrial fibrillation ([place]) 10/18/2014  . Peripheral vascular disease ([place]) 10/16    RT LEG dvt  . Pneumonia 11/17    hx  . S/P minimally invasive mitral valve repair 11/19/2015    Complex valvuloplasty including artificial Gore-tex neochord placement x 22, closure of cleft in posterior leaflet, and 34 mm Edwards Physio II ring annuloplasty via right mini thoracotomy approach  . S/P minimally invasive maze operation for atrial fibrillation 11/19/2015    Complete bilateral atrial lesion set using cryothermy and bipolar radiofrequency ablation with clipping of LA appendage via right mini thoracotomy approch   Allergies  Allergen Reactions  . Inderal [Propranolol Hcl] Itching    SEVERE LIP(INSIDE ITCH) AND SKIN DRYNESS  . Latex Itching    Sensitive to latex  . Tape Other (See Comments)    Very sensitive skin-    Social History   Social History  . Marital Status: Married    Spouse Name: N/A  . Number of Children: N/A  . Years of Education: N/A   Occupational History  . retired    Social History Main Topics  . Smoking status: Former Smoker -- 1.00 packs/day for 20 years    Types: Cigarettes    Quit date: 08/11/1970  . Smokeless tobacco: Never Used  . Alcohol Use: 4.2 oz/week    7 Glasses of wine per week  . Drug Use: No  . Sexual Activity: Yes    Birth Control/ Protection: Surgical     Comment: intercourse age 6, sexual partners less than 5   Other Topics Concern  . None   Social History Narrative    Filed Vitals:   12/12/15 1035  BP: 110/70  Pulse: 56  Temp: 97.8 F (36.6 C)  Resp: 12   Body mass index is 22.24 kg/(m^2).  SpO2 Readings from Last 3 Encounters:  12/12/15 97%  12/09/15 96%  11/25/15 100%     Physical Exam  Constitutional: She is oriented to person, place, and time. She appears well-developed  and well-nourished. She does not appear ill. No distress.  HENT:  Head: Atraumatic.  Mouth/Throat: Oropharynx is clear and moist and mucous membranes are normal.  Eyes: Conjunctivae and EOM are normal. Pupils are equal, round, and reactive to light.  Cardiovascular: Regular rhythm.  Bradycardia present.   Pulses:      Radial pulses are 2+ on the right side, and 2+ on the left side.       Posterior tibial pulses are 2+ on the right side, and 2+ on the left side.  ? Soft SEM RUSB and apex. Mild bradycardia.  Respiratory: Effort normal and breath sounds normal. No respiratory distress.  GI: Soft. She exhibits no mass. There is no tenderness.  Musculoskeletal: She exhibits no edema.       Left shoulder: She exhibits normal range of  motion and no tenderness.       Arms: On deltoid area left UE there is a nodular lesion , hard, no mobile, mild tender, about 10-11 cm, mildly tender, no erythema appreciated ,mild local heat and a punctuate central superficial excoriation. No fluctuant area and skin is ecchymotic.    Lymphadenopathy:    She has no cervical adenopathy.       Right: No supraclavicular adenopathy present.       Left: No supraclavicular adenopathy present.  Neurological: She is alert and oriented to person, place, and time. She has normal strength. Coordination normal.  Skin: Skin is warm. Ecchymosis noted. No rash noted. No erythema.     Wounds on right chest wall healing well, no erythema or edema.  Psychiatric: She has a normal mood and affect.  Well groomed, good eye contact.      ASSESSMENT AND PLAN:     [place] was seen today for knot on arm.  Diagnoses and all orders for this visit:  Mass of upper extremity, left -     VAS [place] UPPER EXTREMITY VENOUS DUPLEX; Future  Hematoma of arm, left, initial encounter -     VAS [place] UPPER EXTREMITY VENOUS DUPLEX; Future  Long term current use of anticoagulant therapy -     POC INR   I think lesion on left upper extremity is  a hematoma, it doesn't seem to be concerning at this point.  Side effect of anticoagulation and Coumadin discussed.  Ultrasound of left upper extremity Duplex  was arranged for today at around 4 PM, to evaluate for thrombotic events, no clinical findings suggestive of vein thrombosis today.  INR stable at 3.8, no changes in recommendations already given at the Coumadin clinic, follow as planned. Further recommendations will be given according to ultrasound results.   She was instructed about warning signs.       -She was advised to return or notify a doctor immediately if symptoms worsen or persist or new concerns arise, she and her husband voice understanding.       Zakaiya Lares G. [place], MD  [facility]. [place] office.

## 2015-12-12 NOTE — Progress Notes (Signed)
Pre visit review using our clinic review tool, if applicable. No additional management support is needed unless otherwise documented below in the visit note. 

## 2015-12-12 NOTE — Telephone Encounter (Signed)
Pt has appointment for the ultrasound at 4:00 today, but is very anxious to hear of results. Please call as soon as you can. Thanks.

## 2015-12-12 NOTE — Patient Instructions (Signed)
A few things to remember from today's visit:   1. Mass of upper extremity, left  - VAS [place] UPPER EXTREMITY VENOUS DUPLEX; Future  2. Hematoma of arm, left, initial encounter  - VAS [place] UPPER EXTREMITY VENOUS DUPLEX; Future  3. Long term current use of anticoagulant therapy  - POC INR      If you sign-up for My chart, you can communicate easier with [place] in case you have any question or concern.

## 2015-12-12 NOTE — Addendum Note (Signed)
Addended by: Loren Racer on: 12/12/2015 09:23 AM   Modules accepted: Orders

## 2015-12-17 ENCOUNTER — Ambulatory Visit (INDEPENDENT_AMBULATORY_CARE_PROVIDER_SITE_OTHER): Payer: Medicare Other | Admitting: Cardiology

## 2015-12-17 ENCOUNTER — Ambulatory Visit (INDEPENDENT_AMBULATORY_CARE_PROVIDER_SITE_OTHER): Payer: Medicare Other

## 2015-12-17 ENCOUNTER — Encounter: Payer: Self-pay | Admitting: Cardiology

## 2015-12-17 VITALS — BP 112/78 | HR 89 | Ht 68.0 in | Wt 142.1 lb

## 2015-12-17 DIAGNOSIS — I480 Paroxysmal atrial fibrillation: Secondary | ICD-10-CM

## 2015-12-17 DIAGNOSIS — Z9889 Other specified postprocedural states: Secondary | ICD-10-CM | POA: Diagnosis not present

## 2015-12-17 DIAGNOSIS — I340 Nonrheumatic mitral (valve) insufficiency: Secondary | ICD-10-CM | POA: Diagnosis not present

## 2015-12-17 DIAGNOSIS — Z8679 Personal history of other diseases of the circulatory system: Secondary | ICD-10-CM

## 2015-12-17 DIAGNOSIS — Z5181 Encounter for therapeutic drug level monitoring: Secondary | ICD-10-CM | POA: Diagnosis not present

## 2015-12-17 LAB — TSH: TSH: 2.45 mIU/L

## 2015-12-17 LAB — POCT INR: INR: 2.4

## 2015-12-17 NOTE — Patient Instructions (Addendum)
Medication Instructions:  Your physician recommends that you continue on your current medications as directed. Please refer to the Current Medication list given to you today.  Labwork: Your physician recommends that you return for lab work today: TSH/Liver   Testing/Procedures: Your physician has requested that you have an echocardiogram. Echocardiography is a painless test that uses sound waves to create images of your heart. It provides your doctor with information about the size and shape of your heart and how well your heart's chambers and valves are working. This procedure takes approximately one hour. There are no restrictions for this procedure.    Follow-Up: Your physician recommends that you schedule a follow-up appointment in: 2 months with Dr Aundra Dubin   Any Other Special Instructions Will Be Listed Below (If Applicable).     If you need a refill on your cardiac medications before your next appointment, please call your pharmacy.

## 2015-12-18 LAB — HEPATIC FUNCTION PANEL
ALT: 12 U/L (ref 6–29)
AST: 18 U/L (ref 10–35)
Albumin: 3.6 g/dL (ref 3.6–5.1)
Alkaline Phosphatase: 94 U/L (ref 33–130)
Bilirubin, Direct: 0.1 mg/dL (ref <=0.2)
Indirect Bilirubin: 0.3 mg/dL (ref 0.2–1.2)
Total Bilirubin: 0.4 mg/dL (ref 0.2–1.2)
Total Protein: 6.4 g/dL (ref 6.1–8.1)

## 2015-12-18 NOTE — Progress Notes (Signed)
Patient ID: [name], female   DOB: 09/24/1945, 70 y.o.   MRN: [id number] PCP: Dr. [name]  70 yo with history of mitral valve prolapse and mitral regurgitation presents for followup.  Workup was done for mitral regurgitation initially in 2012.  She has bileaflet MV prolapse.  Mitral regurgitation was moderate to severe by TEE in 6/12.  ETT showed excellent exercise tolerance with no evidence for exercise limitation from MR.  We planned to continue monitoring her mitral regurgitation periodically.  Patient was then admitted in 10/12 with severe chest pain while working in her yard.  She had a left heart cath showing only mild luminal irregularities. Echo showed moderate mitral regurgitation.  3 week event monitor showed only PACs.    Patient developed sinus pressure/drainage concerning for sinusitis in 5/16.  During this illness, she noted her heart thumping/beating irregularly.  She developed dyspnea with vigorous walking or vacuuming.  No syncope, lightheadedness, or chest pain.  Symptoms persisted, so she saw Dr [name].  She was found to be atrial fibrillation with mild RVR.  She was started on Toprol XL and Eliquis.  She converted back to NSR.  She wore a 30 day event monitor in 5/16 that showed a total of 6 minutes atrial fibrillation.    I had her do a repeat echo in 4/17.  This showed worsened mitral regurgitation, now severe.  EF remained 60-65%. This was confirmed by TEE.  She developed atrial fibrillation and exertional dyspnea.  In 5/17, she had MV repair and Maze procedure.  Uneventful post-op course.  Today, she is in NSR.  She seems to be doing well overall. She walks for about a mile in the morning with mild fatigue.  Dyspnea only when walking up hills.  No palpitations.    Labs (5/12): TSH normal, LDL 68, HDL 58, cardiac enzymes negative x 3 Labs (4/14): K 4.2, creatinine 0.8, TSH normal, BNP 38 Labs (4/15): LDL 100, HDL 93 Labs (9/15): 37.4 Labs (4/16): TSH normal Labs (5/16):  K 4.1, creatinine 0.86, HCT 38.3 Labs (10/16): K 3.5, creatinine 0.8 Labs (6/17): K 4.1, creatinine 0.75  ECG: NSR, lateral and inferior T wave inversions, poor RWP.   PMH:  1. Mitral valve prolapse: Known since teenager.  Echo (5/12): EF 60%, normal LV size, grade II diastolic dysfunction, myxomatous mitral valve with bileaflet prolapse and moderate to severe mitral regurgitation, mild to moderate TR, PA systolic pressure 39 mmHg.  TEE (6/12) with EF 60%, moderate to severe MR with bileaflet prolapse and no systolic flow reversal in the pulmonary vein doppler signal, mild to moderate LAE.  ETT for exercise capacity (7/12): 12'19" on treadmill, no evidence of symptomatic limitation from mitral regurgitation. Echo (10/12): EF 60-65%, severe MVP with moderate MR.  Echo (4/14) with EF 60-65%, moderate MR with bileaflet MVP and normal RV.  Echo (4/16) with EF 55-60%, MVP with moderate mitral regurgitation, severe LAE.  Echo (4/17) with EF 60-65%, bileaflet mitral valve prolapse with severe MR, moderate TR.   - TEE (5/17) with EF 60-65%, severe MR, bileaflet mitral valve prolapse.  - 11/19/15 minimally invasive mitral valve repair + Maze.  2. Right shoulder surgery 2012.  3. Pulmonary nodule: followed by Dr. [name] 4. Thyroid nodule s/p biopsy: benign.  5. TAH-BSO due to endometriosis.  6. Appendectomy.  7. Chest pain: Lexiscan myoview (6/12):  EF 63%, no ischemia or infarction.  Left heart cath (10/12): Mild luminal irregularities only.  LHC (5/17) with 40% pRCA stenosis.  8.  Atrial fibrillation: Paroxysmal, 1st noted in 4/16.  Event monitor 5/16 with 6 minutes of atrial fibrillation noted.   9. Carotid dopplers (5/17) with 1-39% BICA stenosis.    SH: Quit smoking at age 25.  Moved to [place] from [place] several years ago.  Married.  2 glasses wine/night.   FH: No CAD or valvular disease.   ROS: all systems reviewed and negative except as per HPI.   Current Outpatient Prescriptions  Medication  Sig Dispense Refill  . acetaminophen (TYLENOL) 500 MG tablet Take 500 mg by mouth every 6 (six) hours as needed for mild pain or headache.    Marland Kitchen amiodarone (PACERONE) 200 MG tablet Take 1 tablet (200 mg total) by mouth daily. 30 tablet 1  . aspirin EC 325 MG EC tablet Take 1 tablet (325 mg total) by mouth daily. 30 tablet 0  . atorvastatin (LIPITOR) 20 MG tablet take 1 tablet by mouth daily  6  . fluticasone (FLONASE) 50 MCG/ACT nasal spray Place 1 spray into both nostrils daily as needed for allergies or rhinitis.    . hydroxypropyl methylcellulose / hypromellose (ISOPTO TEARS / GONIOVISC) 2.5 % ophthalmic solution Place 1 drop into both eyes as needed for dry eyes.    . metoprolol succinate (TOPROL-XL) 25 MG 24 hr tablet Take 1 tablet (25 mg total) by mouth daily. 30 tablet 3  . nystatin (MYCOSTATIN/NYSTOP) 100000 UNIT/GM POWD Apply 1 application topically daily as needed (skin).   2  . warfarin (COUMADIN) 4 MG tablet Take 1 tablet (4 mg total) by mouth daily at 6 PM. 30 tablet 3  . zolpidem (AMBIEN) 10 MG tablet TAKE 1 TABLET BY MOUTH AT BEDTIME AS NEEDED FOR SLEEP 30 tablet 0   No current facility-administered medications for this visit.   Facility-Administered Medications Ordered in Other Visits  Medication Dose Route Frequency Provider Last Rate Last Dose  . glutaraldehyde 0.625% soaking solution   Topical PRN Rexene Alberts, MD        BP 112/78 mmHg  Pulse 89  Ht 5\' 8"  (1.727 m)  Wt 142 lb 1.9 oz (64.465 kg)  BMI 21.61 kg/m2  SpO2 99%  LMP 06/22/1977 General: NAD Neck: No JVD, no thyromegaly or thyroid nodule.  Lungs: Clear to auscultation bilaterally with normal respiratory effort. CV: Nondisplaced PMI.  Heart regular S1/S2, no S3/S4, 1/6 SEM RUSB.  No peripheral edema.  No carotid bruit.  Normal pedal pulses.  Abdomen: Soft, nontender, no hepatosplenomegaly, no distention.  Neurologic: Alert and oriented x 3.  Psych: Normal affect. Extremities: No clubbing or cyanosis.    Assessment/Plan: 1. Mitral regurgitation: Now s/p mitral valve repair.  Doing well post-op.   - I will arrange for post-op echo.  - She can start cardiac rehab.   2. Atrial fibrillation: Paroxysmal.  She has LAE with severe MR, so has substrate for atrial fibrillation.  CHADSVASC = 2 (gender, age).  She had Maze procedure with MV repair.  Today, she is in NSR.  - Continue anticoagulation with warfarin.  Can stop ASA now given warfarin use.   - Continue Toprol XL 25 daily.  - She should be able to stop amiodarone at 1 month post-op as long as she has no further atrial fibrillation.  Check LFTs/TSH today.  - Limit ETOH. - She does not screen positive for OSA.    [name] 12/18/2015

## 2015-12-26 ENCOUNTER — Ambulatory Visit (INDEPENDENT_AMBULATORY_CARE_PROVIDER_SITE_OTHER): Payer: Medicare Other | Admitting: *Deleted

## 2015-12-26 DIAGNOSIS — I480 Paroxysmal atrial fibrillation: Secondary | ICD-10-CM | POA: Diagnosis not present

## 2015-12-26 DIAGNOSIS — Z8679 Personal history of other diseases of the circulatory system: Secondary | ICD-10-CM | POA: Diagnosis not present

## 2015-12-26 DIAGNOSIS — Z5181 Encounter for therapeutic drug level monitoring: Secondary | ICD-10-CM | POA: Diagnosis not present

## 2015-12-26 DIAGNOSIS — Z9889 Other specified postprocedural states: Secondary | ICD-10-CM

## 2015-12-26 LAB — POCT INR: INR: 2.1

## 2015-12-27 ENCOUNTER — Telehealth: Payer: Self-pay | Admitting: Cardiology

## 2015-12-27 NOTE — Telephone Encounter (Signed)
New message    Pt husband is calling for rn he states that he wants to take pt to see her mother who lives 4 hours away in [place]  Please call

## 2015-12-27 NOTE — Telephone Encounter (Addendum)
Spoke with pt's husband, DPR on file.  Pt is wanting to go to [place], [place] to see her mom because of current health situation. Pt's husband wanted to know if pt is safe to travel the 4 hrs since she just recently had a procedure? Advised I will send message to Dr. Aundra Dubin for review and advisement.  Also advised husband to call Dr. Guy Sandifer office as Dr. Aundra Dubin is away this week. Husband agreeable.

## 2015-12-28 NOTE — Telephone Encounter (Signed)
I think that the travel would be ok at this point as long as her husband drives.

## 2015-12-30 NOTE — Telephone Encounter (Signed)
**Note De-Identified Gray Doering Obfuscation** The pt is advised and she verbalized understanding. She states that because she is so sore they have decided that she will stay at home and not go on trip.

## 2015-12-30 NOTE — Telephone Encounter (Signed)
No answer and no way to leave a message. Will continue to call. 

## 2016-01-01 ENCOUNTER — Other Ambulatory Visit: Payer: Self-pay | Admitting: Family Medicine

## 2016-01-01 NOTE — Telephone Encounter (Signed)
Due 12/04/2015

## 2016-01-03 ENCOUNTER — Other Ambulatory Visit: Payer: Self-pay

## 2016-01-03 ENCOUNTER — Ambulatory Visit (INDEPENDENT_AMBULATORY_CARE_PROVIDER_SITE_OTHER): Payer: Medicare Other | Admitting: *Deleted

## 2016-01-03 ENCOUNTER — Ambulatory Visit (HOSPITAL_COMMUNITY): Payer: Medicare Other | Attending: Cardiology

## 2016-01-03 DIAGNOSIS — I480 Paroxysmal atrial fibrillation: Secondary | ICD-10-CM | POA: Diagnosis not present

## 2016-01-03 DIAGNOSIS — Z9889 Other specified postprocedural states: Secondary | ICD-10-CM | POA: Diagnosis not present

## 2016-01-03 DIAGNOSIS — I351 Nonrheumatic aortic (valve) insufficiency: Secondary | ICD-10-CM | POA: Diagnosis not present

## 2016-01-03 DIAGNOSIS — I517 Cardiomegaly: Secondary | ICD-10-CM | POA: Diagnosis not present

## 2016-01-03 DIAGNOSIS — Z8679 Personal history of other diseases of the circulatory system: Secondary | ICD-10-CM

## 2016-01-03 DIAGNOSIS — I313 Pericardial effusion (noninflammatory): Secondary | ICD-10-CM | POA: Diagnosis not present

## 2016-01-03 DIAGNOSIS — I071 Rheumatic tricuspid insufficiency: Secondary | ICD-10-CM | POA: Insufficient documentation

## 2016-01-03 DIAGNOSIS — Z87891 Personal history of nicotine dependence: Secondary | ICD-10-CM | POA: Insufficient documentation

## 2016-01-03 DIAGNOSIS — I059 Rheumatic mitral valve disease, unspecified: Secondary | ICD-10-CM | POA: Diagnosis present

## 2016-01-03 DIAGNOSIS — Z5181 Encounter for therapeutic drug level monitoring: Secondary | ICD-10-CM | POA: Diagnosis not present

## 2016-01-03 DIAGNOSIS — R002 Palpitations: Secondary | ICD-10-CM | POA: Diagnosis not present

## 2016-01-03 LAB — POCT INR: INR: 2.3

## 2016-01-14 ENCOUNTER — Encounter (HOSPITAL_COMMUNITY)
Admission: RE | Admit: 2016-01-14 | Discharge: 2016-01-14 | Disposition: A | Discharge status: Home / Self Care | Payer: Medicare Other | Source: Ambulatory Visit | Attending: Cardiology | Admitting: Cardiology

## 2016-01-14 VITALS — BP 118/72 | HR 85 | Ht 67.0 in | Wt 143.1 lb

## 2016-01-14 DIAGNOSIS — Z9889 Other specified postprocedural states: Secondary | ICD-10-CM | POA: Diagnosis not present

## 2016-01-14 DIAGNOSIS — Z8679 Personal history of other diseases of the circulatory system: Secondary | ICD-10-CM | POA: Diagnosis not present

## 2016-01-14 NOTE — Progress Notes (Signed)
Cardiac Rehab Medication Review by a Pharmacist  Does the patient  feel that his/her medications are working for him/her?  yes  Has the patient been experiencing any side effects to the medications prescribed?  Yes, feeling tired if she takes meds in the morning, therefore she takes all of her daily meds in the evening  Does the patient measure his/her own blood pressure or blood glucose at home?  no   Does the patient have any problems obtaining medications due to transportation or finances?   no  Understanding of regimen: good Understanding of indications: good Potential of compliance: good    Pharmacist comments: KG presents to cardiac rehab in good spirits without any complaints. She has a good understanding of her medication regimen and importance of her medications.  [name], PharmD PGY1 Pharmacy Resident [phone] (Pager) 01/14/2016 2:08 PM

## 2016-01-16 ENCOUNTER — Encounter (HOSPITAL_COMMUNITY): Payer: Self-pay

## 2016-01-16 NOTE — Progress Notes (Signed)
Cardiac Individual Treatment Plan  Patient Details  Name: [name] MRN: [id number] Date of Birth: 11-Oct-1945 Referring Provider:   Flowsheet Row CARDIAC REHAB PHASE II ORIENTATION from 01/14/2016 in [place]  Referring Provider  [name] MD      Initial Encounter Date:  [place] PHASE II ORIENTATION from 01/14/2016 in [facility]  Date  01/14/16  Referring Provider  [name] MD      Visit Diagnosis: S/P mitral valve repair  S/P Maze operation for atrial fibrillation  Patient's Home Medications on Admission:  Current Outpatient Prescriptions:  .  amiodarone (PACERONE) 200 MG tablet, Take 1 tablet (200 mg total) by mouth daily., Disp: 30 tablet, Rfl: 1 .  atorvastatin (LIPITOR) 20 MG tablet, take 1 tablet by mouth daily, Disp: , Rfl: 6 .  metoprolol succinate (TOPROL-XL) 25 MG 24 hr tablet, Take 1 tablet (25 mg total) by mouth daily., Disp: 30 tablet, Rfl: 3 .  nystatin (MYCOSTATIN/NYSTOP) 100000 UNIT/GM POWD, Apply 1 application topically daily as needed (skin). , Disp: , Rfl: 2 .  warfarin (COUMADIN) 4 MG tablet, Take 1 tablet (4 mg total) by mouth daily at 6 PM., Disp: 30 tablet, Rfl: 3 .  zolpidem (AMBIEN) 10 MG tablet, TAKE 1 TABLET BY MOUTH AT BEDTIME AS NEEDED FOR SLEEP, Disp: 30 tablet, Rfl: 0 .  acetaminophen (TYLENOL) 500 MG tablet, Take 500 mg by mouth every 6 (six) hours as needed for mild pain or headache., Disp: , Rfl:  .  aspirin EC 325 MG EC tablet, Take 1 tablet (325 mg total) by mouth daily. (Patient not taking: Reported on 01/14/2016), Disp: 30 tablet, Rfl: 0 .  fluticasone (FLONASE) 50 MCG/ACT nasal spray, Place 1 spray into both nostrils daily as needed for allergies or rhinitis., Disp: , Rfl:  .  hydroxypropyl methylcellulose / hypromellose (ISOPTO TEARS / GONIOVISC) 2.5 % ophthalmic solution, Place 1 drop into both eyes as needed for dry eyes., Disp: , Rfl:  No  current facility-administered medications for this encounter.   Facility-Administered Medications Ordered in Other Encounters:  .  glutaraldehyde 0.625% soaking solution, , Topical, PRN, Rexene Alberts, MD  Past Medical History: Past Medical History:  Diagnosis Date  . Heart murmur   . History of melanoma excision   . History of palpitations   . INSOMNIA, CHRONIC 03/29/2009  . Mitral regurgitation    severe by TEE  . MVP (mitral valve prolapse)   . Osteopenia 01/2014   T score -1.8 FRAX 8.8%/1.3%  . Paroxysmal atrial fibrillation ([place]) 10/18/2014  . Peripheral vascular disease ([place]) 10/16   RT LEG dvt  . Pneumonia 11/17   hx  . PONV (postoperative nausea and vomiting)   . Prepatellar bursitis    RT KNEE  . S/P minimally invasive maze operation for atrial fibrillation 11/19/2015   Complete bilateral atrial lesion set using cryothermy and bipolar radiofrequency ablation with clipping of LA appendage via right mini thoracotomy approch  . S/P minimally invasive mitral valve repair 11/19/2015   Complex valvuloplasty including artificial Gore-tex neochord placement x 22, closure of cleft in posterior leaflet, and 34 mm Edwards Physio II ring annuloplasty via right mini thoracotomy approach  . Skin cancer of arm    Squamous    Tobacco Use: History  Smoking Status  . Former Smoker  . Packs/day: 1.00  . Years: 20.00  . Types: Cigarettes  . Quit date: 08/11/1970  Smokeless Tobacco  .  Never Used    Labs: Recent Review Flowsheet Data    Labs for ITP Cardiac and Pulmonary Rehab Latest Ref Rng & Units 11/19/2015 11/19/2015 11/19/2015 11/20/2015 11/20/2015   Cholestrol 0 - 200 mg/dL - - - - -   LDLCALC 0 - 99 mg/dL - - - - -   LDLDIRECT mg/dL - - - - -   HDL >39.00 mg/dL - - - - -   Trlycerides 0.0 - 149.0 mg/dL - - - - -   Hemoglobin A1c 4.8 - 5.6 % - - - - -   PHART 7.350 - 7.450 7.298(L) - 7.282(L) 7.337(L) -   PCO2ART 35.0 - 45.0 mmHg 40.4 - 45.0 43.0 -   HCO3 20.0 - 24.0 mEq/L  19.8(L) - 21.2 23.1 -   TCO2 0 - 100 mmol/L 21 21 22 24 25    ACIDBASEDEF 0.0 - 2.0 mmol/L 6.0(H) - 5.0(H) 3.0(H) -   O2SAT % 98.0 - 99.0 92.0 -      Capillary Blood Glucose: Lab Results  Component Value Date   GLUCAP 134 (H) 11/21/2015   GLUCAP 113 (H) 11/21/2015   GLUCAP 127 (H) 11/21/2015   GLUCAP 140 (H) 11/20/2015   GLUCAP 113 (H) 11/20/2015     Exercise Target Goals: Date: 01/14/16  Exercise Program Goal: Individual exercise prescription set with THRR, safety & activity barriers. Participant demonstrates ability to understand and report RPE using BORG scale, to self-measure pulse accurately, and to acknowledge the importance of the exercise prescription.  Exercise Prescription Goal: Starting with aerobic activity 30 plus minutes a day, 3 days per week for initial exercise prescription. Provide home exercise prescription and guidelines that participant acknowledges understanding prior to discharge.  Activity Barriers & Risk Stratification:     Activity Barriers & Cardiac Risk Stratification - 01/15/16 1524      Activity Barriers & Cardiac Risk Stratification   Activity Barriers None   Cardiac Risk Stratification Moderate      6 Minute Walk:     6 Minute Walk    Row Name 01/14/16 1526         6 Minute Walk   Phase Initial     Distance 1633 feet     Walk Time 6 minutes     # of Rest Breaks 0     MPH 3.09     METS 3.88     RPE 10     VO2 Peak 13.58     Symptoms No     Resting HR 85 bpm     Resting BP 118/72     Max Ex. HR 101 bpm     Max Ex. BP 122/60     2 Minute Post BP 114/92        Initial Exercise Prescription:     Initial Exercise Prescription - 01/15/16 1500      Date of Initial Exercise RX and Referring Provider   Date 01/14/16   Referring Provider [name] MD     Treadmill   MPH 2.8   Grade 2   Minutes 10   METs 3.92     Bike   Level 1   Minutes 10   METs 3.9     NuStep   Level 3   Minutes 10   METs 3      Prescription Details   Frequency (times per week) 3   Duration Progress to 30 minutes of continuous aerobic without signs/symptoms of physical distress     Intensity  THRR 40-80% of Max Heartrate 61-122   Ratings of Perceived Exertion 11-13   Perceived Dyspnea 0-4     Progression   Progression Continue to progress workloads to maintain intensity without signs/symptoms of physical distress.     Resistance Training   Training Prescription Yes   Weight 2 lbs   Reps 10-12      Perform Capillary Blood Glucose checks as needed.  Exercise Prescription Changes:   Exercise Comments:   Discharge Exercise Prescription (Final Exercise Prescription Changes):   Nutrition:  Target Goals: Understanding of nutrition guidelines, daily intake of sodium 1500mg , cholesterol 200mg , calories 30% from fat and 7% or less from saturated fats, daily to have 5 or more servings of fruits and vegetables.  Biometrics:     Pre Biometrics - 01/15/16 1521      Pre Biometrics   Height 5\' 7"  (1.702 m)   Weight 143 lb 1.3 oz (64.9 kg)   Waist Circumference 30.25 inches   Hip Circumference 38.25 inches   Waist to Hip Ratio 0.79 %   BMI (Calculated) 22.5   Triceps Skinfold 25 mm   % Body Fat 23.2 %   Grip Strength 26.5 kg   Flexibility 16.5 in   Single Leg Stand 30 seconds       Nutrition Therapy Plan and Nutrition Goals:   Nutrition Discharge: Nutrition Scores:   Nutrition Goals Re-Evaluation:   Psychosocial: Target Goals: Acknowledge presence or absence of depression, maximize coping skills, provide positive support system. Participant is able to verbalize types and ability to use techniques and skills needed for reducing stress and depression.  Initial Review & Psychosocial Screening:     Initial Psych Review & Screening - 01/14/16 [place]? Yes     Barriers   Psychosocial barriers to participate in program The patient should benefit  from training in stress management and relaxation.     Screening Interventions   Interventions Encouraged to exercise      Quality of Life Scores:     Quality of Life - 01/15/16 1525      Quality of Life Scores   Health/Function Pre 20.48 %   Socioeconomic Pre 20 %   Psych/Spiritual Pre 24 %   Family Pre 28.8 %   GLOBAL Pre 22.52 %      PHQ-9: Recent Review Flowsheet Data    Depression screen [facility] 2/9 01/04/2015 10/19/2013   Decreased Interest 0 0   Down, Depressed, Hopeless 0 0   PHQ - 2 Score 0 0      Psychosocial Evaluation and Intervention:   Psychosocial Re-Evaluation:   Vocational Rehabilitation: Provide vocational rehab assistance to qualifying candidates.   Vocational Rehab Evaluation & Intervention:     Vocational Rehab - 01/14/16 1608      Initial Vocational Rehab Evaluation & Intervention   Assessment shows need for Vocational Rehabilitation No      Education: Education Goals: Education classes will be provided on a weekly basis, covering required topics. Participant will state understanding/return demonstration of topics presented.  Learning Barriers/Preferences:     Learning Barriers/Preferences - 01/16/16 1131      Learning Barriers/Preferences   Learning Barriers Sight      Education Topics: Count Your Pulse:  -Group instruction provided by verbal instruction, demonstration, patient participation and written materials to support subject.  Instructors address importance of being able to find your pulse and how to count your pulse when at home  without a heart monitor.  Patients get hands on experience counting their pulse with staff help and individually.   Heart Attack, Angina, and Risk Factor Modification:  -Group instruction provided by verbal instruction, video, and written materials to support subject.  Instructors address signs and symptoms of angina and heart attacks.    Also discuss risk factors for heart disease and how to make  changes to improve heart health risk factors.   Functional Fitness:  -Group instruction provided by verbal instruction, demonstration, patient participation, and written materials to support subject.  Instructors address safety measures for doing things around the house.  Discuss how to get up and down off the floor, how to pick things up properly, how to safely get out of a chair without assistance, and balance training.   Meditation and Mindfulness:  -Group instruction provided by verbal instruction, patient participation, and written materials to support subject.  Instructor addresses importance of mindfulness and meditation practice to help reduce stress and improve awareness.  Instructor also leads participants through a meditation exercise.    Stretching for Flexibility and Mobility:  -Group instruction provided by verbal instruction, patient participation, and written materials to support subject.  Instructors lead participants through series of stretches that are designed to increase flexibility thus improving mobility.  These stretches are additional exercise for major muscle groups that are typically performed during regular warm up and cool down.   Hands Only CPR Anytime:  -Group instruction provided by verbal instruction, video, patient participation and written materials to support subject.  Instructors co-teach with AHA video for hands only CPR.  Participants get hands on experience with mannequins.   Nutrition I class: Heart Healthy Eating:  -Group instruction provided by PowerPoint slides, verbal discussion, and written materials to support subject matter. The instructor gives an explanation and review of the Therapeutic Lifestyle Changes diet recommendations, which includes a discussion on lipid goals, dietary fat, sodium, fiber, plant stanol/sterol esters, sugar, and the components of a well-balanced, healthy diet.   Nutrition II class: Lifestyle Skills:  -Group instruction  provided by PowerPoint slides, verbal discussion, and written materials to support subject matter. The instructor gives an explanation and review of label reading, grocery shopping for heart health, heart healthy recipe modifications, and ways to make healthier choices when eating out.   Diabetes Question & Answer:  -Group instruction provided by PowerPoint slides, verbal discussion, and written materials to support subject matter. The instructor gives an explanation and review of diabetes co-morbidities, pre- and post-prandial blood glucose goals, pre-exercise blood glucose goals, signs, symptoms, and treatment of hypoglycemia and hyperglycemia, and foot care basics.   Diabetes Blitz:  -Group instruction provided by PowerPoint slides, verbal discussion, and written materials to support subject matter. The instructor gives an explanation and review of the physiology behind type 1 and type 2 diabetes, diabetes medications and rational behind using different medications, pre- and post-prandial blood glucose recommendations and Hemoglobin A1c goals, diabetes diet, and exercise including blood glucose guidelines for exercising safely.    Portion Distortion:  -Group instruction provided by PowerPoint slides, verbal discussion, written materials, and food models to support subject matter. The instructor gives an explanation of serving size versus portion size, changes in portions sizes over the last 20 years, and what consists of a serving from each food group.   Stress Management:  -Group instruction provided by verbal instruction, video, and written materials to support subject matter.  Instructors review role of stress in heart disease and how to cope with  stress positively.     Exercising on Your Own:  -Group instruction provided by verbal instruction, power point, and written materials to support subject.  Instructors discuss benefits of exercise, components of exercise, frequency and intensity of  exercise, and end points for exercise.  Also discuss use of nitroglycerin and activating EMS.  Review options of places to exercise outside of rehab.  Review guidelines for sex with heart disease.   Cardiac Drugs I:  -Group instruction provided by verbal instruction and written materials to support subject.  Instructor reviews cardiac drug classes: antiplatelets, anticoagulants, beta blockers, and statins.  Instructor discusses reasons, side effects, and lifestyle considerations for each drug class.   Cardiac Drugs II:  -Group instruction provided by verbal instruction and written materials to support subject.  Instructor reviews cardiac drug classes: angiotensin converting enzyme inhibitors (ACE-I), angiotensin II receptor blockers (ARBs), nitrates, and calcium channel blockers.  Instructor discusses reasons, side effects, and lifestyle considerations for each drug class.   Anatomy and Physiology of the Circulatory System:  -Group instruction provided by verbal instruction, video, and written materials to support subject.  Reviews functional anatomy of heart, how it relates to various diagnoses, and what role the heart plays in the overall system.   Knowledge Questionnaire Score:   Core Components/Risk Factors/Patient Goals at Admission:     Personal Goals and Risk Factors at Admission - 01/14/16 1600      Core Components/Risk Factors/Patient Goals on Admission   Personal Goal Other Yes   Personal Goal get back to previous activities ie walking and tennis    Intervention monitored cardiac rehab exercise, home exercise education   Expected Outcomes achievement of increased cardiorespiratory fitness and enhanced flexability, muscular endurance and strength shown through measurements of functional capacity and personal statement of participation.       Core Components/Risk Factors/Patient Goals Review:    Core Components/Risk Factors/Patient Goals at Discharge (Final Review):     ITP Comments:     ITP Comments    Row Name 01/14/16 1330           ITP Comments Medical Director- Dr. Fransico Him, MD          Comments: Patient attended orientation from 1330 to 1530 to review rules and guidelines for program. Completed 6 minute walk test, Intitial ITP, and exercise prescription.  VSS. Telemetry-Sinus rhythm with t wave inversion this has been previously documented.  Asymptomatic.Barnet Pall, RN,BSN 01/16/2016 11:55 AM

## 2016-01-17 ENCOUNTER — Ambulatory Visit (INDEPENDENT_AMBULATORY_CARE_PROVIDER_SITE_OTHER): Payer: Medicare Other | Admitting: *Deleted

## 2016-01-17 DIAGNOSIS — Z9889 Other specified postprocedural states: Secondary | ICD-10-CM

## 2016-01-17 DIAGNOSIS — Z8679 Personal history of other diseases of the circulatory system: Secondary | ICD-10-CM

## 2016-01-17 DIAGNOSIS — Z5181 Encounter for therapeutic drug level monitoring: Secondary | ICD-10-CM | POA: Diagnosis not present

## 2016-01-17 DIAGNOSIS — I480 Paroxysmal atrial fibrillation: Secondary | ICD-10-CM | POA: Diagnosis not present

## 2016-01-17 LAB — POCT INR: INR: 2.5

## 2016-01-17 MED ORDER — WARFARIN SODIUM 4 MG PO TABS: ORAL_TABLET | ORAL | 3 refills | Status: DC

## 2016-01-20 ENCOUNTER — Encounter (HOSPITAL_COMMUNITY): Payer: Medicare Other

## 2016-01-20 ENCOUNTER — Encounter (HOSPITAL_COMMUNITY): Admission: RE | Admit: 2016-01-20 | Payer: Medicare Other | Source: Ambulatory Visit

## 2016-01-22 ENCOUNTER — Encounter (HOSPITAL_COMMUNITY): Payer: Medicare Other

## 2016-01-22 ENCOUNTER — Encounter (HOSPITAL_COMMUNITY)
Admission: RE | Admit: 2016-01-22 | Discharge: 2016-01-22 | Disposition: A | Discharge status: Home / Self Care | Payer: Medicare Other | Source: Ambulatory Visit | Attending: Cardiology | Admitting: Cardiology

## 2016-01-22 DIAGNOSIS — Z8679 Personal history of other diseases of the circulatory system: Secondary | ICD-10-CM | POA: Diagnosis not present

## 2016-01-22 DIAGNOSIS — Z9889 Other specified postprocedural states: Secondary | ICD-10-CM | POA: Diagnosis not present

## 2016-01-22 NOTE — Progress Notes (Signed)
Daily Session Note  Patient Details  Name: [name] MRN: [id number] Date of Birth: March 10, 1946 Referring Provider:   Flowsheet Row CARDIAC REHAB PHASE II ORIENTATION from 01/14/2016 in [place]  Referring Provider  [name] MD      Encounter Date: 01/22/2016  Check In:     Session Check In - 01/22/16 1134      Check-In   Location [facility]   Staff Present Maurice Small, RN, BSN;Amber Fair, MS, ACSM RCEP, Exercise Physiologist;Olinty Celesta Aver, MS, ACSM CEP, Exercise Physiologist;Joann Rion, RN, BSN   Supervising physician immediately available to respond to emergencies [facility] immediately available   Physician(s) Dr. Marily Memos    Medication changes reported     No   Fall or balance concerns reported    No   Warm-up and Cool-down Performed as group-led instruction   Resistance Training Performed Yes   VAD Patient? No     Pain Assessment   Currently in Pain? No/denies      Capillary Blood Glucose: No results found for this or any previous visit (from the past 24 hour(s)).   Goals Met:  Exercise tolerated well Personal goals reviewed  Goals Unmet:  Not Applicable  Comments:  Pt started full exercise today at phase II cardiac rehab program.  Pt tolerated light exercise without difficulty. VSS, telemetry-SR, asymptomatic.  Medication list reconciled. Pt denies barriers to medication compliance.  PSYCHOSOCIAL ASSESSMENT:  PHQ-0. Pt exhibits positive coping skills, hopeful outlook with supportive family. No psychosocial needs identified at this time, no psychosocial interventions necessary.    Pt enjoys tennis, garden work, walking and reading. Pt desires to get back to previous activities such as walking and playing doubles tennis.  Pt would like to work herself back to 5 miles of walking a day.  Pt is presently walking 3 miles a day.  Pt oriented to exercise equipment and routine.    Understanding verbalized.  Maurice Small RN, BSN     Dr. Fransico Him is Medical Director for Cardiac Rehab at [facility]

## 2016-01-23 DIAGNOSIS — D1801 Hemangioma of skin and subcutaneous tissue: Secondary | ICD-10-CM | POA: Diagnosis not present

## 2016-01-23 DIAGNOSIS — L821 Other seborrheic keratosis: Secondary | ICD-10-CM | POA: Diagnosis not present

## 2016-01-23 DIAGNOSIS — K130 Diseases of lips: Secondary | ICD-10-CM | POA: Diagnosis not present

## 2016-01-23 DIAGNOSIS — L814 Other melanin hyperpigmentation: Secondary | ICD-10-CM | POA: Diagnosis not present

## 2016-01-23 DIAGNOSIS — Z85820 Personal history of malignant melanoma of skin: Secondary | ICD-10-CM | POA: Diagnosis not present

## 2016-01-23 DIAGNOSIS — D235 Other benign neoplasm of skin of trunk: Secondary | ICD-10-CM | POA: Diagnosis not present

## 2016-01-24 ENCOUNTER — Encounter (HOSPITAL_COMMUNITY): Payer: Medicare Other

## 2016-01-24 ENCOUNTER — Encounter (HOSPITAL_COMMUNITY)
Admission: RE | Admit: 2016-01-24 | Discharge: 2016-01-24 | Disposition: A | Discharge status: Home / Self Care | Payer: Medicare Other | Source: Ambulatory Visit | Attending: Cardiology | Admitting: Cardiology

## 2016-01-24 DIAGNOSIS — Z9889 Other specified postprocedural states: Secondary | ICD-10-CM | POA: Diagnosis not present

## 2016-01-24 DIAGNOSIS — Z8679 Personal history of other diseases of the circulatory system: Secondary | ICD-10-CM | POA: Diagnosis not present

## 2016-01-27 ENCOUNTER — Encounter (HOSPITAL_COMMUNITY)
Admission: RE | Admit: 2016-01-27 | Discharge: 2016-01-27 | Disposition: A | Discharge status: Home / Self Care | Payer: Medicare Other | Source: Ambulatory Visit | Attending: Cardiology | Admitting: Cardiology

## 2016-01-27 ENCOUNTER — Encounter (HOSPITAL_COMMUNITY): Payer: Medicare Other

## 2016-01-27 ENCOUNTER — Encounter: Payer: Self-pay | Admitting: Thoracic Surgery (Cardiothoracic Vascular Surgery)

## 2016-01-27 ENCOUNTER — Ambulatory Visit (INDEPENDENT_AMBULATORY_CARE_PROVIDER_SITE_OTHER): Payer: Self-pay | Admitting: Thoracic Surgery (Cardiothoracic Vascular Surgery)

## 2016-01-27 VITALS — BP 109/73 | HR 99 | Resp 16 | Ht 68.0 in | Wt 140.0 lb

## 2016-01-27 DIAGNOSIS — I480 Paroxysmal atrial fibrillation: Secondary | ICD-10-CM

## 2016-01-27 DIAGNOSIS — Z8679 Personal history of other diseases of the circulatory system: Secondary | ICD-10-CM | POA: Diagnosis not present

## 2016-01-27 DIAGNOSIS — I340 Nonrheumatic mitral (valve) insufficiency: Secondary | ICD-10-CM

## 2016-01-27 DIAGNOSIS — Z9889 Other specified postprocedural states: Secondary | ICD-10-CM

## 2016-01-27 DIAGNOSIS — I341 Nonrheumatic mitral (valve) prolapse: Secondary | ICD-10-CM

## 2016-01-27 MED ORDER — AMIODARONE HCL 200 MG PO TABS: 100.0000 mg | ORAL_TABLET | Freq: Every day | ORAL | 0 refills | Status: DC

## 2016-01-27 NOTE — Progress Notes (Signed)
[name]       [place] [address]             [phone]     CARDIOTHORACIC SURGERY OFFICE NOTE  Referring Provider is Larey Dresser, MD PCP is [name], MD   HPI:  Patient returns to the office today for routine follow-up status post minimally invasive mitral valve repair and Maze procedure on 11/19/2015 for severe symptomatic primary mitral regurgitation and recurrent paroxysmal atrial fibrillation. She was last seen here in our office on 12/09/2015. Since then she has been seen in follow-up by Dr. Aundra Dubin on 12/17/2015. The patient reports that she is doing quite well. Because of delays at the rehabilitation office she only recently started the outpatient cardiac rehabilitation program. However, she is already walking a tremendous amount. Her exercise tolerance continues to improve. She no longer has any significant pain in her chest. She has mild numbness along the surgical incision in the right lateral chest wall. Appetite is good. She has no palpitations or others symptoms to suggest a recurrence of atrial fibrillation.  Her prothrombin time is been checked and her Coumadin dose adjusted through the Coumadin clinic.   Current Outpatient Prescriptions  Medication Sig Dispense Refill  . acetaminophen (TYLENOL) 500 MG tablet Take 500 mg by mouth every 6 (six) hours as needed for mild pain or headache.    Marland Kitchen amiodarone (PACERONE) 200 MG tablet Take 1 tablet (200 mg total) by mouth daily. 30 tablet 1  . atorvastatin (LIPITOR) 20 MG tablet take 1 tablet by mouth daily  6  . fluticasone (FLONASE) 50 MCG/ACT nasal spray Place 1 spray into both nostrils daily as needed for allergies or rhinitis.    . hydroxypropyl methylcellulose / hypromellose (ISOPTO TEARS / GONIOVISC) 2.5 % ophthalmic solution Place 1 drop into both eyes as needed for dry eyes.    . metoprolol succinate (TOPROL-XL) 25 MG 24 hr tablet Take 1 tablet (25 mg total) by mouth daily. 30 tablet  3  . nystatin (MYCOSTATIN/NYSTOP) 100000 UNIT/GM POWD Apply 1 application topically daily as needed (skin).   2  . warfarin (COUMADIN) 4 MG tablet Take as directed by coumadin clinic 20 tablet 3  . zolpidem (AMBIEN) 10 MG tablet TAKE 1 TABLET BY MOUTH AT BEDTIME AS NEEDED FOR SLEEP 30 tablet 0   No current facility-administered medications for this visit.    Facility-Administered Medications Ordered in Other Visits  Medication Dose Route Frequency Provider Last Rate Last Dose  . glutaraldehyde 0.625% soaking solution   Topical PRN Rexene Alberts, MD          Physical Exam:   BP 109/73   Pulse 99   Resp 16   Ht 5\' 8"  (1.727 m)   Wt 140 lb (63.5 kg)   LMP 06/22/1977   SpO2 99% Comment: ON RA  BMI 21.29 kg/m   General:  Well-appearing  Chest:   Clear to auscultation  CV:   Regular rate and rhythm without murmur  Incisions:  Healing nicely  Abdomen:  Soft nontender  Extremities:  Warm and well-perfused  Diagnostic Tests:  Transthoracic Echocardiography  Patient:    April, Liu MR #:       [id number] Study Date: 01/03/2016 Gender:     F Age:        70 Height:     172.7 cm Weight:     64.5 kg BSA:        1.76 m^2  Pt. Status: Room:   ATTENDING    [name], M.D.  ORDERING     [name], M.D.  REFERRING    [name], M.D.  SONOGRAPHER  Wyatt Mage, RDCS  PERFORMING   [facility], Outpatient  cc:  ------------------------------------------------------------------- LV EF: 55% -   60%  ------------------------------------------------------------------- Indications:      S/p mitral valve repair (Z98.89).  ------------------------------------------------------------------- History:   PMH:   Atrial fibrillation.  Mitral valve prolapse. Mitral valve disease.  Risk factors:  Palpitation. Former tobacco use.  ------------------------------------------------------------------- Study Conclusions  - Left ventricle: The cavity size was normal. Systolic  function was   normal. The estimated ejection fraction was in the range of 55%   to 60%. Wall motion was normal; there were no regional wall   motion abnormalities. The study is not technically sufficient to   allow evaluation of LV diastolic function. - Aortic valve: There was trivial regurgitation. - Left atrium: The atrium was mildly dilated. - Right ventricle: The cavity size was mildly dilated. Wall   thickness was normal. - Right atrium: The atrium was moderately dilated. - Tricuspid valve: There was moderate regurgitation. - Pulmonary arteries: Systolic pressure was within the normal   range. PA peak pressure: 29 mm Hg (S). - Inferior vena cava: The vessel was normal in size. - Pericardium, extracardiac: A trivial pericardial effusion was   identified.  Impressions:  - When compared to the prior study from 10/21/2015 mitral valve is   post repair. There is residual regurgitation and normal   transmitral gradients.  ------------------------------------------------------------------- Labs, prior tests, procedures, and surgery: Transesophageal echocardiography (11/19/2015).    The mitral valve showed severe regurgitation.  EF was 60%.  Surgery.    Maze procedure. Valve surgery.     Mitral valve repair.  ------------------------------------------------------------------- Study data:  Comparison was made to the study of 11/19/2015.  Study status:  Routine.  Procedure:  The patient reported no pain pre or post test. Transthoracic echocardiography. Image quality was adequate.  Study completion:  There were no complications. Transthoracic echocardiography.  M-mode, complete 2D, spectral Doppler, and color Doppler.  Birthdate:  Patient birthdate: 1946-06-14.  Age:  Patient is 70 yr old.  Sex:  Gender: female. BMI: 21.6 kg/m^2.  Blood pressure:     112/78  Patient status: Outpatient.  Study date:  Study date: 01/03/2016. Study time: 03:43 PM.  Location:  [facility]  3  -------------------------------------------------------------------  ------------------------------------------------------------------- Left ventricle:  The cavity size was normal. Systolic function was normal. The estimated ejection fraction was in the range of 55% to 60%. Wall motion was normal; there were no regional wall motion abnormalities. The study is not technically sufficient to allow evaluation of LV diastolic function.  ------------------------------------------------------------------- Aortic valve:   Trileaflet; normal thickness leaflets. Mobility was not restricted.  Doppler:  Transvalvular velocity was within the normal range. There was no stenosis. There was trivial regurgitation.  ------------------------------------------------------------------- Aorta:  Aortic root: The aortic root was normal in size.  ------------------------------------------------------------------- Mitral valve:  S/P mitral valve repair. Normal transmitral gradients. No mitral regurgitation. Mobility was not restricted. Doppler:  Transvalvular velocity was within the normal range. There was no evidence for stenosis. There was no regurgitation.    Valve area by pressure half-time: 3.49 cm^2. Indexed valve area by pressure half-time: 1.99 cm^2/m^2. Valve area by continuity equation (using LVOT flow): 2.05 cm^2. Indexed valve area by continuity equation (using LVOT flow): 1.17 cm^2/m^2.    Mean gradient (D): 4 mm Hg. Peak gradient (D):  4 mm Hg.  ------------------------------------------------------------------- Left atrium:  The atrium was mildly dilated.  ------------------------------------------------------------------- Right ventricle:  The cavity size was mildly dilated. Wall thickness was normal. Systolic function was normal.  ------------------------------------------------------------------- Pulmonic valve:    Structurally normal valve.   Cusp separation was normal.   Doppler:  Transvalvular velocity was within the normal range. There was no evidence for stenosis. There was no regurgitation.  ------------------------------------------------------------------- Tricuspid valve:   Structurally normal valve.    Doppler: Transvalvular velocity was within the normal range. There was moderate regurgitation.  ------------------------------------------------------------------- Pulmonary artery:   The main pulmonary artery was normal-sized. Systolic pressure was within the normal range.  ------------------------------------------------------------------- Right atrium:  The atrium was moderately dilated.  ------------------------------------------------------------------- Pericardium:  A trivial pericardial effusion was identified.  ------------------------------------------------------------------- Systemic veins: Inferior vena cava: The vessel was normal in size.  ------------------------------------------------------------------- Measurements   Left ventricle                            Value          Reference  LV ID, ED, PLAX chordal                   49.1  mm       43 - 52  LV ID, ES, PLAX chordal                   34.9  mm       23 - 38  LV fx shortening, PLAX chordal            29    %        >=29  LV PW thickness, ED                       8.63  mm       ---------  IVS/LV PW ratio, ED                       1.1            <=1.3  Stroke volume, 2D                         51    ml       ---------  Stroke volume/bsa, 2D                     29    ml/m^2   ---------  LV e&', lateral                            3.32  cm/s     ---------  LV E/e&', lateral                          31.02          ---------  LV e&', medial                             5.7   cm/s     ---------  LV E/e&', medial                           18.07          ---------  LV e&', average                            4.51  cm/s     ---------  LV E/e&', average                           22.84          ---------    Ventricular septum                        Value          Reference  IVS thickness, ED                         9.45  mm       ---------    LVOT                                      Value          Reference  LVOT ID, S                                20    mm       ---------  LVOT area                                 3.14  cm^2     ---------  LVOT peak velocity, S                     82.7  cm/s     ---------  LVOT mean velocity, S                     57.6  cm/s     ---------  LVOT VTI, S                               16.3  cm       ---------    Aorta                                     Value          Reference  Aortic root ID, ED                        35    mm       ---------    Left atrium                               Value          Reference  LA ID, A-P, ES                            39    mm       ---------  LA ID/bsa, A-P                    (  H)     2.22  cm/m^2   <=2.2  LA volume, S                              68    ml       ---------  LA volume/bsa, S                          38.7  ml/m^2   ---------  LA volume, ES, 1-p A4C                    71    ml       ---------  LA volume/bsa, ES, 1-p A4C                40.4  ml/m^2   ---------  LA volume, ES, 1-p A2C                    60    ml       ---------  LA volume/bsa, ES, 1-p A2C                34.1  ml/m^2   ---------    Mitral valve                              Value          Reference  Mitral E-wave peak velocity               103   cm/s     ---------  Mitral A-wave peak velocity               84.9  cm/s     ---------  Mitral mean velocity, D                   89.3  cm/s     ---------  Mitral deceleration time          (L)     127   ms       150 - 230  Mitral pressure half-time                 63    ms       ---------  Mitral mean gradient, D                   4     mm Hg    ---------  Mitral peak gradient, D                   4     mm Hg    ---------  Mitral E/A ratio, peak                    1.2             ---------  Mitral valve area, PHT, DP                3.49  cm^2     ---------  Mitral valve area/bsa, PHT, DP            1.99  cm^2/m^2 ---------  Mitral valve area, LVOT                   2.05  cm^2     ---------  continuity  Mitral valve area/bsa, LVOT               1.17  cm^2/m^2 ---------  continuity  Mitral annulus VTI, D                     25    cm       ---------    Pulmonary arteries                        Value          Reference  PA pressure, S, DP                        29    mm Hg    <=30    Tricuspid valve                           Value          Reference  Tricuspid regurg peak velocity            231   cm/s     ---------  Tricuspid peak RV-RA gradient             21    mm Hg    ---------    Systemic veins                            Value          Reference  Estimated CVP                             8     mm Hg    ---------    Right ventricle                           Value          Reference  RV pressure, S, DP                        29    mm Hg    <=30  RV s&', lateral, S                         10.5  cm/s     ---------  Legend: (L)  and  (H)  mark values outside specified reference range.  ------------------------------------------------------------------- Prepared and Electronically Authenticated by  Ena Dawley, M.D. [id number]:47:50   Impression:  Patient is doing very well approximately 2 months status post minimally invasive mitral valve repair and Maze procedure. She is maintaining sinus rhythm and clinically progressing nicely. Follow-up echocardiogram performed 01/03/2016 revealed intact mitral valve repair with no mitral regurgitation and normal left ventricular systolic function.   Plan:  I have instructed the patient to decrease her dose of amiodarone to 100 mg daily. I would recommend stopping amiodarone within the next 4-6 weeks if she continues to maintain sinus rhythm.  I have encouraged the patient to continue to gradually  increase her physical activity as tolerated without any specific limitations.  All of her questions have been addressed. The patient will continue to follow-up with Dr. Aundra Dubin. She will be referred to the atrial fibrillation clinic for long-term surveillance following Maze  procedure. We will plan to see her back in approximately 4 months for routine follow-up and rhythm check.   [name], MD 01/27/2016 1:35 PM

## 2016-01-27 NOTE — Patient Instructions (Signed)
You may continue to gradually increase your physical activity as tolerated. Avoid activities that cause increased pain in your chest on the side of your surgical incision.  Otherwise you may continue to increase activities without any particular limitations.  Increase the intensity and duration of physical activity gradually.  Decrease amiodarone to 100 mg/day until your current prescription runs out then stop amiodarone

## 2016-01-27 NOTE — Progress Notes (Signed)
Reviewed home exercise guidelines with patient including endpoints, temperature precautions, target heart rate and rate of perceived exertion. Pt is currently walking daily, 45-60 minutes as her mode of home exercise. Pt voices understanding of instructions given. April Passer, MS, ACSM CCEP

## 2016-01-28 DIAGNOSIS — H18413 Arcus senilis, bilateral: Secondary | ICD-10-CM | POA: Diagnosis not present

## 2016-01-28 DIAGNOSIS — H524 Presbyopia: Secondary | ICD-10-CM | POA: Diagnosis not present

## 2016-01-28 DIAGNOSIS — H2513 Age-related nuclear cataract, bilateral: Secondary | ICD-10-CM | POA: Diagnosis not present

## 2016-01-28 DIAGNOSIS — H5203 Hypermetropia, bilateral: Secondary | ICD-10-CM | POA: Diagnosis not present

## 2016-01-28 DIAGNOSIS — H52221 Regular astigmatism, right eye: Secondary | ICD-10-CM | POA: Diagnosis not present

## 2016-01-29 ENCOUNTER — Encounter (HOSPITAL_COMMUNITY)
Admission: RE | Admit: 2016-01-29 | Discharge: 2016-01-29 | Disposition: A | Discharge status: Home / Self Care | Payer: Medicare Other | Source: Ambulatory Visit | Attending: Cardiology | Admitting: Cardiology

## 2016-01-29 ENCOUNTER — Encounter (HOSPITAL_COMMUNITY): Payer: Medicare Other

## 2016-01-29 DIAGNOSIS — Z8679 Personal history of other diseases of the circulatory system: Secondary | ICD-10-CM

## 2016-01-29 DIAGNOSIS — Z9889 Other specified postprocedural states: Secondary | ICD-10-CM

## 2016-01-31 ENCOUNTER — Encounter (HOSPITAL_COMMUNITY): Payer: Medicare Other

## 2016-01-31 ENCOUNTER — Encounter (HOSPITAL_COMMUNITY)
Admission: RE | Admit: 2016-01-31 | Discharge: 2016-01-31 | Disposition: A | Discharge status: Home / Self Care | Payer: Medicare Other | Source: Ambulatory Visit | Attending: Cardiology | Admitting: Cardiology

## 2016-01-31 DIAGNOSIS — Z8679 Personal history of other diseases of the circulatory system: Secondary | ICD-10-CM

## 2016-01-31 DIAGNOSIS — Z9889 Other specified postprocedural states: Secondary | ICD-10-CM

## 2016-02-03 ENCOUNTER — Encounter (HOSPITAL_COMMUNITY): Payer: Medicare Other

## 2016-02-03 ENCOUNTER — Encounter (HOSPITAL_COMMUNITY)
Admission: RE | Admit: 2016-02-03 | Discharge: 2016-02-03 | Disposition: A | Discharge status: Home / Self Care | Payer: Medicare Other | Source: Ambulatory Visit | Attending: Cardiology | Admitting: Cardiology

## 2016-02-03 DIAGNOSIS — Z9889 Other specified postprocedural states: Secondary | ICD-10-CM | POA: Diagnosis not present

## 2016-02-03 DIAGNOSIS — Z8679 Personal history of other diseases of the circulatory system: Secondary | ICD-10-CM | POA: Diagnosis not present

## 2016-02-03 NOTE — Progress Notes (Signed)
QUALITY OF LIFE SCORE REVIEW  Pt completed Quality of Life survey as a participant in Cardiac Rehab. Scores 21.0 or below are considered low. Pt scored ow in the areas of health and function, socioeconomic.  Pt scored the following      Quality of Life - 01/15/16 1525      Quality of Life Scores   Health/Function Pre 20.48 %   Socioeconomic Pre 20 %   Psych/Spiritual Pre 24 %   Family Pre 28.8 %   GLOBAL Pre 22.52 %      Discussed with pt her responses.  Pt realized she misread the questions.  Pt is dissatisfied with her neighborhood.  Pt home is on the market for sale. Pt desires to return back to playing tennis and running.  Pt was very active prior to her valve surgery and is eager to return back to her previous activities.Pt feels participating in cardiac rehab will be benefiial for helping her in this process.  Offered emotional support and reassurance.  Will continue to monitor and intervene as necessary.  Cherre Huger, BSN

## 2016-02-05 ENCOUNTER — Encounter (HOSPITAL_COMMUNITY)
Admission: RE | Admit: 2016-02-05 | Discharge: 2016-02-05 | Disposition: A | Discharge status: Home / Self Care | Payer: Medicare Other | Source: Ambulatory Visit | Attending: Cardiology | Admitting: Cardiology

## 2016-02-05 ENCOUNTER — Encounter (HOSPITAL_COMMUNITY): Payer: Medicare Other

## 2016-02-05 DIAGNOSIS — Z9889 Other specified postprocedural states: Secondary | ICD-10-CM

## 2016-02-05 DIAGNOSIS — Z8679 Personal history of other diseases of the circulatory system: Secondary | ICD-10-CM | POA: Diagnosis not present

## 2016-02-06 NOTE — Progress Notes (Signed)
Cardiac Individual Treatment Plan  Patient Details  Name: [name] MRN: [id number] Date of Birth: Dec 04, 1945 Referring Provider:   Flowsheet Row CARDIAC REHAB PHASE II ORIENTATION from 01/14/2016 in [facility]  Referring Provider  [name] MD      Initial Encounter Date:  [facility] PHASE II ORIENTATION from 01/14/2016 in [facility]  Date  01/14/16  Referring Provider  [name] MD      Visit Diagnosis: S/P mitral valve repair  Patient's Home Medications on Admission:  Current Outpatient Prescriptions:  .  acetaminophen (TYLENOL) 500 MG tablet, Take 500 mg by mouth every 6 (six) hours as needed for mild pain or headache., Disp: , Rfl:  .  amiodarone (PACERONE) 200 MG tablet, Take 0.5 tablets (100 mg total) by mouth daily., Disp: 30 tablet, Rfl: 0 .  atorvastatin (LIPITOR) 20 MG tablet, take 1 tablet by mouth daily, Disp: , Rfl: 6 .  fluticasone (FLONASE) 50 MCG/ACT nasal spray, Place 1 spray into both nostrils daily as needed for allergies or rhinitis., Disp: , Rfl:  .  hydroxypropyl methylcellulose / hypromellose (ISOPTO TEARS / GONIOVISC) 2.5 % ophthalmic solution, Place 1 drop into both eyes as needed for dry eyes., Disp: , Rfl:  .  metoprolol succinate (TOPROL-XL) 25 MG 24 hr tablet, Take 1 tablet (25 mg total) by mouth daily., Disp: 30 tablet, Rfl: 3 .  nystatin (MYCOSTATIN/NYSTOP) 100000 UNIT/GM POWD, Apply 1 application topically daily as needed (skin). , Disp: , Rfl: 2 .  warfarin (COUMADIN) 4 MG tablet, Take as directed by coumadin clinic, Disp: 20 tablet, Rfl: 3 .  zolpidem (AMBIEN) 10 MG tablet, TAKE 1 TABLET BY MOUTH AT BEDTIME AS NEEDED FOR SLEEP, Disp: 30 tablet, Rfl: 0  Past Medical History: Past Medical History:  Diagnosis Date  . Heart murmur   . History of melanoma excision   . History of palpitations   . INSOMNIA, CHRONIC 03/29/2009  . Mitral regurgitation    severe  by TEE  . MVP (mitral valve prolapse)   . Osteopenia 01/2014   T score -1.8 FRAX 8.8%/1.3%  . Paroxysmal atrial fibrillation ([facility]) 10/18/2014  . Peripheral vascular disease ([place]) 10/16   RT LEG dvt  . Pneumonia 11/17   hx  . PONV (postoperative nausea and vomiting)   . Prepatellar bursitis    RT KNEE  . S/P minimally invasive maze operation for atrial fibrillation 11/19/2015   Complete bilateral atrial lesion set using cryothermy and bipolar radiofrequency ablation with clipping of LA appendage via right mini thoracotomy approch  . S/P minimally invasive mitral valve repair 11/19/2015   Complex valvuloplasty including artificial Gore-tex neochord placement x 22, closure of cleft in posterior leaflet, and 34 mm Edwards Physio II ring annuloplasty via right mini thoracotomy approach  . Skin cancer of arm    Squamous    Tobacco Use: History  Smoking Status  . Former Smoker  . Packs/day: 1.00  . Years: 20.00  . Types: Cigarettes  . Quit date: 08/11/1970  Smokeless Tobacco  . Never Used    Labs: Recent Review Flowsheet Data    Labs for ITP Cardiac and Pulmonary Rehab Latest Ref Rng & Units 11/19/2015 11/19/2015 11/19/2015 11/20/2015 11/20/2015   Cholestrol 0 - 200 mg/dL - - - - -   LDLCALC 0 - 99 mg/dL - - - - -   LDLDIRECT mg/dL - - - - -   HDL >39.00 mg/dL - - - - -  Trlycerides 0.0 - 149.0 mg/dL - - - - -   Hemoglobin A1c 4.8 - 5.6 % - - - - -   PHART 7.350 - 7.450 7.298(L) - 7.282(L) 7.337(L) -   PCO2ART 35.0 - 45.0 mmHg 40.4 - 45.0 43.0 -   HCO3 20.0 - 24.0 mEq/L 19.8(L) - 21.2 23.1 -   TCO2 0 - 100 mmol/L '21 21 22 24 25   ' ACIDBASEDEF 0.0 - 2.0 mmol/L 6.0(H) - 5.0(H) 3.0(H) -   O2SAT % 98.0 - 99.0 92.0 -      Capillary Blood Glucose: Lab Results  Component Value Date   GLUCAP 134 (H) 11/21/2015   GLUCAP 113 (H) 11/21/2015   GLUCAP 127 (H) 11/21/2015   GLUCAP 140 (H) 11/20/2015   GLUCAP 113 (H) 11/20/2015     Exercise Target Goals:    Exercise Program  Goal: Individual exercise prescription set with THRR, safety & activity barriers. Participant demonstrates ability to understand and report RPE using BORG scale, to self-measure pulse accurately, and to acknowledge the importance of the exercise prescription.  Exercise Prescription Goal: Starting with aerobic activity 30 plus minutes a day, 3 days per week for initial exercise prescription. Provide home exercise prescription and guidelines that participant acknowledges understanding prior to discharge.  Activity Barriers & Risk Stratification:     Activity Barriers & Cardiac Risk Stratification - 01/15/16 1524      Activity Barriers & Cardiac Risk Stratification   Activity Barriers None   Cardiac Risk Stratification Moderate      6 Minute Walk:     6 Minute Walk    Row Name 01/14/16 1526         6 Minute Walk   Phase Initial     Distance 1633 feet     Walk Time 6 minutes     # of Rest Breaks 0     MPH 3.09     METS 3.88     RPE 10     VO2 Peak 13.58     Symptoms No     Resting HR 85 bpm     Resting BP 118/72     Max Ex. HR 101 bpm     Max Ex. BP 122/60     2 Minute Post BP 114/92        Initial Exercise Prescription:     Initial Exercise Prescription - 01/15/16 1500      Date of Initial Exercise RX and Referring Provider   Date 01/14/16   Referring Provider [name] MD     Treadmill   MPH 2.8   Grade 2   Minutes 10   METs 3.92     Bike   Level 1   Minutes 10   METs 3.9     NuStep   Level 3   Minutes 10   METs 3     Prescription Details   Frequency (times per week) 3   Duration Progress to 30 minutes of continuous aerobic without signs/symptoms of physical distress     Intensity   THRR 40-80% of Max Heartrate 61-122   Ratings of Perceived Exertion 11-13   Perceived Dyspnea 0-4     Progression   Progression Continue to progress workloads to maintain intensity without signs/symptoms of physical distress.     Resistance Training    Training Prescription Yes   Weight 2 lbs   Reps 10-12      Perform Capillary Blood Glucose checks as needed.  Exercise Prescription Changes:  Exercise Prescription Changes    Row Name 02/05/16 0900             Exercise Review   Progression Yes         Response to Exercise   Blood Pressure (Admit) 111/78       Blood Pressure (Exercise) 130/70       Blood Pressure (Exit) 103/70       Heart Rate (Admit) 99 bpm       Heart Rate (Exercise) 126 bpm       Heart Rate (Exit) 100 bpm       Rating of Perceived Exertion (Exercise) 12       Comments Reviewed home exercise guidelines on 01/27/16       Duration Progress to 30 minutes of continuous aerobic without signs/symptoms of physical distress       Intensity THRR unchanged         Progression   Progression Continue to progress workloads to maintain intensity without signs/symptoms of physical distress.       Average METs 3.9         Resistance Training   Weight 4 lbs         Interval Training   Interval Training No         Treadmill   MPH 3       Grade 2       Minutes 10       METs 4.12         Bike   Level 1.2       Minutes 10       METs 4.5         NuStep   Level 4       Minutes 10       METs 3.1         Home Exercise Plan   Plans to continue exercise at Home       Frequency Add 4 additional days to program exercise sessions.          Exercise Comments:     Exercise Comments    Row Name 01/27/16 1210 02/03/16 1646         Exercise Comments Reviewed home exercise guidelines with patient. Off to a good start with exercise. Patient doing well with exercise. Walking daily 45-60 minutes.         Discharge Exercise Prescription (Final Exercise Prescription Changes):     Exercise Prescription Changes - 02/05/16 0900      Exercise Review   Progression Yes     Response to Exercise   Blood Pressure (Admit) 111/78   Blood Pressure (Exercise) 130/70   Blood Pressure (Exit) 103/70   Heart Rate  (Admit) 99 bpm   Heart Rate (Exercise) 126 bpm   Heart Rate (Exit) 100 bpm   Rating of Perceived Exertion (Exercise) 12   Comments Reviewed home exercise guidelines on 01/27/16   Duration Progress to 30 minutes of continuous aerobic without signs/symptoms of physical distress   Intensity THRR unchanged     Progression   Progression Continue to progress workloads to maintain intensity without signs/symptoms of physical distress.   Average METs 3.9     Resistance Training   Weight 4 lbs     Interval Training   Interval Training No     Treadmill   MPH 3   Grade 2   Minutes 10   METs 4.12     Bike   Level 1.2  Minutes 10   METs 4.5     NuStep   Level 4   Minutes 10   METs 3.1     Home Exercise Plan   Plans to continue exercise at Home   Frequency Add 4 additional days to program exercise sessions.      Nutrition:  Target Goals: Understanding of nutrition guidelines, daily intake of sodium <1544m, cholesterol <2033m calories 30% from fat and 7% or less from saturated fats, daily to have 5 or more servings of fruits and vegetables.  Biometrics:     Pre Biometrics - 01/15/16 1521      Pre Biometrics   Height '5\' 7"'  (1.702 m)   Weight 143 lb 1.3 oz (64.9 kg)   Waist Circumference 30.25 inches   Hip Circumference 38.25 inches   Waist to Hip Ratio 0.79 %   BMI (Calculated) 22.5   Triceps Skinfold 25 mm   % Body Fat 23.2 %   Grip Strength 26.5 kg   Flexibility 16.5 in   Single Leg Stand 30 seconds       Nutrition Therapy Plan and Nutrition Goals:   Nutrition Discharge: Nutrition Scores:     Nutrition Assessments - 01/16/16 1512      MEDFICTS Scores   Pre Score 18  will verify score with pt      Nutrition Goals Re-Evaluation:   Psychosocial: Target Goals: Acknowledge presence or absence of depression, maximize coping skills, provide positive support system. Participant is able to verbalize types and ability to use techniques and skills needed for  reducing stress and depression.  Initial Review & Psychosocial Screening:     Initial Psych Review & Screening - [place]14[place]16 [place]     Barriers   Psychosocial barriers to participate in program The patient should benefit from training in stress management and relaxation.     Screening Interventions   Interventions Encouraged to exercise      Quality of Life Scores:     Quality of Life - 01/15/16 1525      Quality of Life Scores   Health/Function Pre 20.48 %   Socioeconomic Pre 20 %   Psych/Spiritual Pre 24 %   Family Pre 28.8 %   GLOBAL Pre 22.52 %      PHQ-9: Recent Review Flowsheet Data    [facility] 01/22/2016 01/04/2015 10/19/2013   Decreased Interest 0 0 0   Down, Depressed, Hopeless 0 0 0   PHQ - 2 Score 0 0 0      Psychosocial Evaluation and Intervention:   Psychosocial Re-Evaluation:   Vocational Rehabilitation: Provide vocational rehab assistance to qualifying candidates.   Vocational Rehab Evaluation & Intervention:     Vocational Rehab - 01/14/16 1608      Initial Vocational Rehab Evaluation & Intervention   Assessment shows need for Vocational Rehabilitation No      Education: Education Goals: Education classes will be provided on a weekly basis, covering required topics. Participant will state understanding/return demonstration of topics presented.  Learning Barriers/Preferences:     Learning Barriers/Preferences - 01/16/16 1131      Learning Barriers/Preferences   Learning Barriers Sight      Education Topics: Count Your Pulse:  -Group instruction provided by verbal instruction, demonstration, patient participation and written materials to support subject.  Instructors address importance of being able to find your pulse and how to count your pulse when at home without  a heart monitor.  Patients get hands on experience counting their pulse with staff help and  individually.   Heart Attack, Angina, and Risk Factor Modification:  -Group instruction provided by verbal instruction, video, and written materials to support subject.  Instructors address signs and symptoms of angina and heart attacks.    Also discuss risk factors for heart disease and how to make changes to improve heart health risk factors.   Functional Fitness:  -Group instruction provided by verbal instruction, demonstration, patient participation, and written materials to support subject.  Instructors address safety measures for doing things around the house.  Discuss how to get up and down off the floor, how to pick things up properly, how to safely get out of a chair without assistance, and balance training.   Meditation and Mindfulness:  -Group instruction provided by verbal instruction, patient participation, and written materials to support subject.  Instructor addresses importance of mindfulness and meditation practice to help reduce stress and improve awareness.  Instructor also leads participants through a meditation exercise.    Stretching for Flexibility and Mobility:  -Group instruction provided by verbal instruction, patient participation, and written materials to support subject.  Instructors lead participants through series of stretches that are designed to increase flexibility thus improving mobility.  These stretches are additional exercise for major muscle groups that are typically performed during regular warm up and cool down.   Hands Only CPR Anytime:  -Group instruction provided by verbal instruction, video, patient participation and written materials to support subject.  Instructors co-teach with AHA video for hands only CPR.  Participants get hands on experience with mannequins.   Nutrition I class: Heart Healthy Eating:  -Group instruction provided by PowerPoint slides, verbal discussion, and written materials to support subject matter. The instructor gives an  explanation and review of the Therapeutic Lifestyle Changes diet recommendations, which includes a discussion on lipid goals, dietary fat, sodium, fiber, plant stanol/sterol esters, sugar, and the components of a well-balanced, healthy diet.   Nutrition II class: Lifestyle Skills:  -Group instruction provided by PowerPoint slides, verbal discussion, and written materials to support subject matter. The instructor gives an explanation and review of label reading, grocery shopping for heart health, heart healthy recipe modifications, and ways to make healthier choices when eating out.   Diabetes Question & Answer:  -Group instruction provided by PowerPoint slides, verbal discussion, and written materials to support subject matter. The instructor gives an explanation and review of diabetes co-morbidities, pre- and post-prandial blood glucose goals, pre-exercise blood glucose goals, signs, symptoms, and treatment of hypoglycemia and hyperglycemia, and foot care basics.   Diabetes Blitz:  -Group instruction provided by PowerPoint slides, verbal discussion, and written materials to support subject matter. The instructor gives an explanation and review of the physiology behind type 1 and type 2 diabetes, diabetes medications and rational behind using different medications, pre- and post-prandial blood glucose recommendations and Hemoglobin A1c goals, diabetes diet, and exercise including blood glucose guidelines for exercising safely.    Portion Distortion:  -Group instruction provided by PowerPoint slides, verbal discussion, written materials, and food models to support subject matter. The instructor gives an explanation of serving size versus portion size, changes in portions sizes over the last 20 years, and what consists of a serving from each food group.   Stress Management:  -Group instruction provided by verbal instruction, video, and written materials to support subject matter.  Instructors  review role of stress in heart disease and how to cope with  stress positively.   Flowsheet Row CARDIAC REHAB PHASE II EXERCISE from 01/22/2016 in [facility]  Date  01/22/16  Instruction Review Code  2- meets goals/outcomes      Exercising on Your Own:  -Group instruction provided by verbal instruction, power point, and written materials to support subject.  Instructors discuss benefits of exercise, components of exercise, frequency and intensity of exercise, and end points for exercise.  Also discuss use of nitroglycerin and activating EMS.  Review options of places to exercise outside of rehab.  Review guidelines for sex with heart disease.   Cardiac Drugs I:  -Group instruction provided by verbal instruction and written materials to support subject.  Instructor reviews cardiac drug classes: antiplatelets, anticoagulants, beta blockers, and statins.  Instructor discusses reasons, side effects, and lifestyle considerations for each drug class.   Cardiac Drugs II:  -Group instruction provided by verbal instruction and written materials to support subject.  Instructor reviews cardiac drug classes: angiotensin converting enzyme inhibitors (ACE-I), angiotensin II receptor blockers (ARBs), nitrates, and calcium channel blockers.  Instructor discusses reasons, side effects, and lifestyle considerations for each drug class.   Anatomy and Physiology of the Circulatory System:  -Group instruction provided by verbal instruction, video, and written materials to support subject.  Reviews functional anatomy of heart, how it relates to various diagnoses, and what role the heart plays in the overall system.   Knowledge Questionnaire Score:   Core Components/Risk Factors/Patient Goals at Admission:     Personal Goals and Risk Factors at Admission - 01/14/16 1600      Core Components/Risk Factors/Patient Goals on Admission   Personal Goal Other Yes   Personal Goal get  back to previous activities ie walking and tennis    Intervention monitored cardiac rehab exercise, home exercise education   Expected Outcomes achievement of increased cardiorespiratory fitness and enhanced flexability, muscular endurance and strength shown through measurements of functional capacity and personal statement of participation.       Core Components/Risk Factors/Patient Goals Review:      Goals and Risk Factor Review    Row Name 02/03/16 1647             Core Components/Risk Factors/Patient Goals Review   Personal Goals Review Other       Review Patient doing well with exercise, walking daily in addition to cardiac rehab. Energy level is about the same.        Expected Outcomes Progress work loads to improve strength and stamina.          Core Components/Risk Factors/Patient Goals at Discharge (Final Review):      Goals and Risk Factor Review - 02/03/16 1647      Core Components/Risk Factors/Patient Goals Review   Personal Goals Review Other   Review Patient doing well with exercise, walking daily in addition to cardiac rehab. Energy level is about the same.    Expected Outcomes Progress work loads to improve strength and stamina.      ITP Comments:     ITP Comments    Row Name 01/14/16 1330 01/24/16 1113         ITP Comments Medical Director- Dr. Fransico Him, MD attended Hypertension education video/lecture class, met outcomes/goals         Comments:  Pt is making expected progress toward personal goals after completing 8 sessions. Recommend continued exercise and life style modification education including  stress management and relaxation techniques to decrease cardiac risk profile.  Psychosocial  Assessment remains unchanged from previous assessment, pt has supportive family with no psychosocial needs, no further intervention warranted at this time. Maurice Small RN

## 2016-02-07 ENCOUNTER — Encounter (HOSPITAL_COMMUNITY): Payer: Medicare Other

## 2016-02-07 ENCOUNTER — Ambulatory Visit (INDEPENDENT_AMBULATORY_CARE_PROVIDER_SITE_OTHER): Payer: Medicare Other | Admitting: Pharmacist

## 2016-02-07 ENCOUNTER — Encounter (HOSPITAL_COMMUNITY)
Admission: RE | Admit: 2016-02-07 | Discharge: 2016-02-07 | Disposition: A | Discharge status: Home / Self Care | Payer: Medicare Other | Source: Ambulatory Visit | Attending: Cardiology | Admitting: Cardiology

## 2016-02-07 DIAGNOSIS — Z9889 Other specified postprocedural states: Secondary | ICD-10-CM | POA: Diagnosis not present

## 2016-02-07 DIAGNOSIS — I480 Paroxysmal atrial fibrillation: Secondary | ICD-10-CM

## 2016-02-07 DIAGNOSIS — Z5181 Encounter for therapeutic drug level monitoring: Secondary | ICD-10-CM

## 2016-02-07 DIAGNOSIS — Z8679 Personal history of other diseases of the circulatory system: Secondary | ICD-10-CM

## 2016-02-07 LAB — POCT INR: INR: 2

## 2016-02-10 ENCOUNTER — Encounter (HOSPITAL_COMMUNITY): Payer: Medicare Other

## 2016-02-10 ENCOUNTER — Encounter (HOSPITAL_COMMUNITY)
Admission: RE | Admit: 2016-02-10 | Discharge: 2016-02-10 | Disposition: A | Discharge status: Home / Self Care | Payer: Medicare Other | Source: Ambulatory Visit | Attending: Cardiology | Admitting: Cardiology

## 2016-02-10 DIAGNOSIS — Z9889 Other specified postprocedural states: Secondary | ICD-10-CM | POA: Diagnosis not present

## 2016-02-10 DIAGNOSIS — Z8679 Personal history of other diseases of the circulatory system: Secondary | ICD-10-CM | POA: Diagnosis not present
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
## 2016-02-12 ENCOUNTER — Telehealth: Payer: Self-pay | Admitting: Family Medicine

## 2016-02-12 ENCOUNTER — Encounter (HOSPITAL_COMMUNITY): Payer: Medicare Other

## 2016-02-12 ENCOUNTER — Encounter (HOSPITAL_COMMUNITY)
Admission: RE | Admit: 2016-02-12 | Discharge: 2016-02-12 | Disposition: A | Discharge status: Home / Self Care | Payer: Medicare Other | Source: Ambulatory Visit | Attending: Cardiology | Admitting: Cardiology

## 2016-02-12 DIAGNOSIS — Z9889 Other specified postprocedural states: Secondary | ICD-10-CM | POA: Diagnosis not present

## 2016-02-12 DIAGNOSIS — Z8679 Personal history of other diseases of the circulatory system: Secondary | ICD-10-CM | POA: Diagnosis not present

## 2016-02-12 MED ORDER — ZOLPIDEM TARTRATE 10 MG PO TABS: 10.0000 mg | ORAL_TABLET | Freq: Every evening | ORAL | 0 refills | Status: DC | PRN

## 2016-02-12 NOTE — Telephone Encounter (Signed)
Appts are UTD and pt is due for refill. Verbally called in.

## 2016-02-12 NOTE — Telephone Encounter (Signed)
Pt husband states they have been calling the pharmacy since Saturday for a refill of  zolpidem (AMBIEN) 10 MG tablet  Pt is out and needs asap. Thanks!  [facility]/ [address] and [address]

## 2016-02-14 ENCOUNTER — Encounter (HOSPITAL_COMMUNITY): Payer: Medicare Other

## 2016-02-14 ENCOUNTER — Encounter (HOSPITAL_COMMUNITY)
Admission: RE | Admit: 2016-02-14 | Discharge: 2016-02-14 | Disposition: A | Discharge status: Home / Self Care | Payer: Medicare Other | Source: Ambulatory Visit | Attending: Cardiology | Admitting: Cardiology

## 2016-02-14 DIAGNOSIS — Z8679 Personal history of other diseases of the circulatory system: Secondary | ICD-10-CM | POA: Diagnosis not present

## 2016-02-14 DIAGNOSIS — Z9889 Other specified postprocedural states: Secondary | ICD-10-CM

## 2016-02-17 ENCOUNTER — Encounter (HOSPITAL_COMMUNITY)
Admission: RE | Admit: 2016-02-17 | Discharge: 2016-02-17 | Disposition: A | Discharge status: Home / Self Care | Payer: Medicare Other | Source: Ambulatory Visit | Attending: Cardiology | Admitting: Cardiology

## 2016-02-17 ENCOUNTER — Encounter (HOSPITAL_COMMUNITY): Payer: Medicare Other

## 2016-02-17 DIAGNOSIS — Z9889 Other specified postprocedural states: Secondary | ICD-10-CM

## 2016-02-17 DIAGNOSIS — Z8679 Personal history of other diseases of the circulatory system: Secondary | ICD-10-CM | POA: Diagnosis not present

## 2016-02-19 ENCOUNTER — Encounter (HOSPITAL_COMMUNITY): Payer: Medicare Other

## 2016-02-19 ENCOUNTER — Encounter (HOSPITAL_COMMUNITY)
Admission: RE | Admit: 2016-02-19 | Discharge: 2016-02-19 | Disposition: A | Discharge status: Home / Self Care | Payer: Medicare Other | Source: Ambulatory Visit | Attending: Cardiology | Admitting: Cardiology

## 2016-02-19 DIAGNOSIS — Z9889 Other specified postprocedural states: Secondary | ICD-10-CM | POA: Diagnosis not present

## 2016-02-19 DIAGNOSIS — Z8679 Personal history of other diseases of the circulatory system: Secondary | ICD-10-CM | POA: Diagnosis not present

## 2016-02-20 ENCOUNTER — Encounter (HOSPITAL_COMMUNITY): Payer: Self-pay | Admitting: Emergency Medicine

## 2016-02-20 ENCOUNTER — Emergency Department (HOSPITAL_COMMUNITY): Payer: Medicare Other

## 2016-02-20 ENCOUNTER — Telehealth: Payer: Self-pay | Admitting: Cardiology

## 2016-02-20 ENCOUNTER — Emergency Department (HOSPITAL_COMMUNITY)
Admission: EM | Admit: 2016-02-20 | Discharge: 2016-02-20 | Disposition: A | Discharge status: Home / Self Care | Payer: Medicare Other | Attending: Emergency Medicine | Admitting: Emergency Medicine

## 2016-02-20 DIAGNOSIS — Z7901 Long term (current) use of anticoagulants: Secondary | ICD-10-CM | POA: Insufficient documentation

## 2016-02-20 DIAGNOSIS — Z79899 Other long term (current) drug therapy: Secondary | ICD-10-CM | POA: Diagnosis not present

## 2016-02-20 DIAGNOSIS — Z87891 Personal history of nicotine dependence: Secondary | ICD-10-CM | POA: Diagnosis not present

## 2016-02-20 DIAGNOSIS — R0789 Other chest pain: Secondary | ICD-10-CM | POA: Insufficient documentation

## 2016-02-20 DIAGNOSIS — Z91040 Latex allergy status: Secondary | ICD-10-CM | POA: Diagnosis not present

## 2016-02-20 LAB — COMPREHENSIVE METABOLIC PANEL
ALT: 27 U/L (ref 14–54)
AST: 34 U/L (ref 15–41)
Albumin: 4.4 g/dL (ref 3.5–5.0)
Alkaline Phosphatase: 133 U/L — ABNORMAL HIGH (ref 38–126)
Anion gap: 11 (ref 5–15)
BILIRUBIN TOTAL: 0.7 mg/dL (ref 0.3–1.2)
BUN: 17 mg/dL (ref 6–20)
CHLORIDE: 106 mmol/L (ref 101–111)
CO2: 23 mmol/L (ref 22–32)
CREATININE: 0.92 mg/dL (ref 0.44–1.00)
Calcium: 10.2 mg/dL (ref 8.9–10.3)
Glucose, Bld: 99 mg/dL (ref 65–99)
POTASSIUM: 4.3 mmol/L (ref 3.5–5.1)
Sodium: 140 mmol/L (ref 135–145)
TOTAL PROTEIN: 7.8 g/dL (ref 6.5–8.1)

## 2016-02-20 LAB — CBC WITH DIFFERENTIAL/PLATELET
BASOS ABS: 0 10*3/uL (ref 0.0–0.1)
Basophils Relative: 1 %
EOS PCT: 6 %
Eosinophils Absolute: 0.4 10*3/uL (ref 0.0–0.7)
HEMATOCRIT: 45.1 % (ref 36.0–46.0)
Hemoglobin: 14.7 g/dL (ref 12.0–15.0)
LYMPHS ABS: 1.2 10*3/uL (ref 0.7–4.0)
LYMPHS PCT: 18 %
MCH: 31.6 pg (ref 26.0–34.0)
MCHC: 32.6 g/dL (ref 30.0–36.0)
MCV: 97 fL (ref 78.0–100.0)
MONO ABS: 0.8 10*3/uL (ref 0.1–1.0)
Monocytes Relative: 11 %
NEUTROS ABS: 4.4 10*3/uL (ref 1.7–7.7)
Neutrophils Relative %: 64 %
PLATELETS: 302 10*3/uL (ref 150–400)
RBC: 4.65 MIL/uL (ref 3.87–5.11)
RDW: 14.7 % (ref 11.5–15.5)
WBC: 6.9 10*3/uL (ref 4.0–10.5)

## 2016-02-20 LAB — I-STAT TROPONIN, ED
TROPONIN I, POC: 0 ng/mL (ref 0.00–0.08)
Troponin i, poc: 0.01 ng/mL (ref 0.00–0.08)

## 2016-02-20 MED ORDER — ASPIRIN 81 MG PO CHEW
324.0000 mg | CHEWABLE_TABLET | Freq: Once | ORAL | Status: AC
  Administered 2016-02-20: 324 mg via ORAL
  Filled 2016-02-20: qty 4

## 2016-02-20 NOTE — ED Notes (Addendum)
Patient transported to X-ray 

## 2016-02-20 NOTE — ED Triage Notes (Signed)
Pt st's she had heart surg. On May 30th.  St's for past week she has been having a discomfort under right and left breast

## 2016-02-20 NOTE — ED Provider Notes (Signed)
[facility] DEPT Provider Note   CSN: [id number] Arrival date & time: 02/20/16  1609     History   Chief Complaint Chief Complaint  Patient presents with  . Chest Pain    HPI [name] is a 70 y.o. female.  The history is provided by the patient.  Chest Pain   This is a new problem. The current episode started 2 days ago. The problem occurs hourly. The problem has not changed since onset.The pain is associated with exertion (deep breaths). Pain location: Bilateral lateral lower chest described as a bra strap that is clasped too tightly. The pain is mild. The quality of the pain is described as brief and pressure-like. The pain does not radiate. Pertinent negatives include no abdominal pain, no back pain, no dizziness, no fever, no malaise/fatigue, no vomiting and no weakness. She has tried nothing for the symptoms. The treatment provided no relief.    Past Medical History:  Diagnosis Date  . Heart murmur   . History of melanoma excision   . History of palpitations   . INSOMNIA, CHRONIC 03/29/2009  . Mitral regurgitation    severe by TEE  . MVP (mitral valve prolapse)   . Osteopenia 01/2014   T score -1.8 FRAX 8.8%/1.3%  . Paroxysmal atrial fibrillation ([place]) 10/18/2014  . Peripheral vascular disease ([place]) 10/16   RT LEG dvt  . Pneumonia 11/17   hx  . PONV (postoperative nausea and vomiting)   . Prepatellar bursitis    RT KNEE  . S/P minimally invasive maze operation for atrial fibrillation 11/19/2015   Complete bilateral atrial lesion set using cryothermy and bipolar radiofrequency ablation with clipping of LA appendage via right mini thoracotomy approch  . S/P minimally invasive mitral valve repair 11/19/2015   Complex valvuloplasty including artificial Gore-tex neochord placement x 22, closure of cleft in posterior leaflet, and 34 mm Edwards Physio II ring annuloplasty via right mini thoracotomy approach  . Skin cancer of arm    Squamous    Patient Active Problem  List   Diagnosis Date Noted  . Encounter for therapeutic drug monitoring 11/27/2015  . S/P minimally invasive mitral valve repair + maze procedure 11/19/2015  . S/P minimally invasive maze operation for atrial fibrillation 11/19/2015  . Paroxysmal atrial fibrillation ([place]) 10/18/2014  . Nodule of left lung 03/14/2014  . Prepatellar bursitis of right knee 03/04/2014  . Malignant melanoma of arm, right. 08/31/2013  . Allergic rhinitis, seasonal 06/19/2011  . MVP (mitral valve prolapse)   . Chest pain 12/15/2010  . Palpitations 12/15/2010  . Mitral regurgitation   . Lung nodule 12/07/2010  . Mitral valve prolapse 12/07/2010  . INSOMNIA, CHRONIC 03/29/2009  . ANXIETY 02/19/2009    Past Surgical History:  Procedure Laterality Date  . ABDOMINAL HYSTERECTOMY  1980   WITH BSO/FIBROIDS  . APPENDECTOMY  1969  . CARDIAC CATHETERIZATION N/A 10/21/2015   Procedure: Right/Left Heart Cath and Coronary Angiography;  Surgeon: Larey Dresser, MD;  Location: [facility];  Service: Cardiovascular;  Laterality: N/A;  . CLIPPING OF ATRIAL APPENDAGE N/A 11/19/2015   Procedure: CLIPPING OF ATRIAL APPENDAGE;  Surgeon: Rexene Alberts, MD;  Location: [facility];  Service: Open Heart Surgery;  Laterality: N/A;  . COLONOSCOPY    . DIAGNOSTIC LAPAROSCOPY     expl  . EXCISION MELANOMA WITH SENTINEL LYMPH NODE BIOPSY Right 10/02/2013   Procedure: WIDE EXCISION MELANOMA RIGHT ARM WITH SENTINEL LYMPH NODE BIOPSY;  Surgeon: Shann Medal, MD;  Location: [facility];  Service: General;  Laterality: Right;  . KNEE BURSECTOMY Right 03/05/2014   Procedure: RIGHT KNEE BURSECTOMY;  Surgeon: Gearlean Alf, MD;  Location: [facility];  Service: Orthopedics;  Laterality: Right;  . MINIMALLY INVASIVE MAZE PROCEDURE N/A 11/19/2015   Procedure: MINIMALLY INVASIVE MAZE PROCEDURE;  Surgeon: Rexene Alberts, MD;  Location: [facility];  Service: Open Heart Surgery;  Laterality: N/A;  . MITRAL VALVE REPAIR Right 11/19/2015    Procedure: MINIMALLY INVASIVE MITRAL VALVE REPAIR (MVR);  Surgeon: Rexene Alberts, MD;  Location: [facility];  Service: Open Heart Surgery;  Laterality: Right;  . OOPHORECTOMY     BSO WITH ABDOMINAL HYSTERECTOMY  . SHOULDER ARTHROSCOPY  2010   right  . TEE WITHOUT CARDIOVERSION N/A 10/21/2015   Procedure: TRANSESOPHAGEAL ECHOCARDIOGRAM (TEE);  Surgeon: Larey Dresser, MD;  Location: [facility];  Service: Cardiovascular;  Laterality: N/A;  . TEE WITHOUT CARDIOVERSION N/A 11/19/2015   Procedure: TRANSESOPHAGEAL ECHOCARDIOGRAM (TEE);  Surgeon: Rexene Alberts, MD;  Location: [facility];  Service: Open Heart Surgery;  Laterality: N/A;  . THYROID CYST EXCISION  05-01-11   RIGHT  . TONSILLECTOMY  1980    OB History    Gravida Para Term Preterm AB Living   3 3       3    SAB TAB Ectopic Multiple Live Births                   Home Medications    Prior to Admission medications   Medication Sig Start Date End Date Taking? Authorizing Provider  acetaminophen (TYLENOL) 500 MG tablet Take 500 mg by mouth every 6 (six) hours as needed for mild pain or headache.    Historical Provider, MD  amiodarone (PACERONE) 200 MG tablet Take 0.5 tablets (100 mg total) by mouth daily. 01/27/16   Rexene Alberts, MD  atorvastatin (LIPITOR) 20 MG tablet take 1 tablet by mouth daily 10/21/15   Historical Provider, MD  fluticasone (FLONASE) 50 MCG/ACT nasal spray Place 1 spray into both nostrils daily as needed for allergies or rhinitis.    Historical Provider, MD  hydroxypropyl methylcellulose / hypromellose (ISOPTO TEARS / GONIOVISC) 2.5 % ophthalmic solution Place 1 drop into both eyes as needed for dry eyes.    Historical Provider, MD  metoprolol succinate (TOPROL-XL) 25 MG 24 hr tablet Take 1 tablet (25 mg total) by mouth daily. 11/25/15   Erin R Barrett, PA-C  nystatin (MYCOSTATIN/NYSTOP) 100000 UNIT/GM POWD Apply 1 application topically daily as needed (skin).  03/07/15   Historical Provider, MD  warfarin (COUMADIN) 4 MG  tablet Take as directed by coumadin clinic 01/17/16   Larey Dresser, MD  zolpidem (AMBIEN) 10 MG tablet Take 1 tablet (10 mg total) by mouth at bedtime as needed. for sleep 02/12/16   [name], MD    Family History Family History  Problem Relation Age of Onset  . Diabetes type II Mother   . Alcohol abuse Father   . Throat cancer Father   . Ovarian cancer Maternal Aunt   . Breast cancer Maternal Grandmother     Social History Social History  Substance Use Topics  . Smoking status: Former Smoker    Packs/day: 1.00    Years: 20.00    Types: Cigarettes    Quit date: 08/11/1970  . Smokeless tobacco: Never Used  . Alcohol use 4.2 oz/week    7 Glasses of wine per week     Allergies  Inderal [propranolol hcl]; Latex; and Tape   Review of Systems Review of Systems  Constitutional: Negative for fever and malaise/fatigue.  Cardiovascular: Positive for chest pain.  Gastrointestinal: Negative for abdominal pain and vomiting.  Musculoskeletal: Negative for back pain.  Neurological: Negative for dizziness and weakness.  All other systems reviewed and are negative.    Physical Exam Updated Vital Signs BP 120/80   Pulse 95   Temp 97.6 F (36.4 C) (Oral)   Resp 13   Ht 5\' 9"  (1.753 m)   Wt 140 lb (63.5 kg)   LMP 06/22/1977   SpO2 99%   BMI 20.67 kg/m   Physical Exam  Constitutional: She is oriented to person, place, and time. She appears well-developed and well-nourished. No distress.  HENT:  Head: Normocephalic.  Eyes: Conjunctivae are normal.  Neck: Neck supple. No tracheal deviation present.  Cardiovascular: Normal rate, regular rhythm and normal heart sounds.   Prominent S2  Pulmonary/Chest: Effort normal. No respiratory distress. She has no wheezes. She has no rales. She exhibits tenderness (bilateral lower ribs anteriorly).  Abdominal: Soft. She exhibits no distension. There is no tenderness.  Neurological: She is alert and oriented to person, place,  and time.  Skin: Skin is warm and dry.  Psychiatric: She has a normal mood and affect.  Vitals reviewed.    ED Treatments / Results  Labs (all labs ordered are listed, but only abnormal results are displayed) Labs Reviewed  COMPREHENSIVE METABOLIC PANEL - Abnormal; Notable for the following:       Result Value   Alkaline Phosphatase 133 (*)    All other components within normal limits  CBC WITH DIFFERENTIAL/PLATELET  Randolm Idol, ED  Randolm Idol, ED    EKG  EKG Interpretation  Date/Time:  Thursday February 20 2016 16:26:28 EDT Ventricular Rate:  99 PR Interval:  192 QRS Duration: 94 QT Interval:  386 QTC Calculation: 495 R Axis:   63 Text Interpretation:  Normal sinus rhythm ST & T wave abnormality, consider inferolateral ischemia Prolonged QT Abnormal ECG No significant change since last tracing 12/17/15 Reconfirmed by Sritha Chauncey MD, Jayah Balthazar [phone]) on 02/20/2016 5:09:51 PM       Radiology Dg Chest 2 View  Result Date: 02/20/2016 CLINICAL DATA:  Chest discomfort EXAM: CHEST  2 VIEW COMPARISON:  Chest x-ray of 12/09/2015 FINDINGS: The lungs are slightly better aerated with improving bibasilar linear atelectasis. No definite pleural effusion is seen. Mediastinal and hilar contours are unremarkable. Mild cardiomegaly is stable. Mitral valve replacement and left atrial appendage exclusion device are noted. No bony abnormality is seen. IMPRESSION: Improved aeration with diminishing bibasilar linear atelectasis. Electronically Signed   By: Ivar Drape M.D.   On: 02/20/2016 16:45    Procedures Procedures (including critical care time)  Medications Ordered in ED Medications  aspirin chewable tablet 324 mg (324 mg Oral Given 02/20/16 1750)     Initial Impression / Assessment and Plan / ED Course  I have reviewed the triage vital signs and the nursing notes.  Pertinent labs & imaging results that were available during my care of the patient were reviewed by me and  considered in my medical decision making (see chart for details).  Clinical Course   70 year old female with history of maze procedure for a fibrillation and mitral valve repair presents with atypical bilateral lateral chest discomfort that appears to be worse with deep breathing. Suspect musculoskeletal pain.   Dr. Martinique was available to discuss the patient's EKG and agreed that it  looks similar to her post procedure baseline state. Due to recent catheterization which demonstrated nonobstructive coronary artery disease the patient is low risk with highly atypical features and would benefit from close outpatient follow-up. Serial troponins are negative despite ongoing discomfort. Wells score for PE is 0, doubt embolus. Plan to follow up with PCP as needed and return precautions discussed for worsening or new concerning symptoms.   Final Clinical Impressions(s) / ED Diagnoses   Final diagnoses:  Chest wall pain    New Prescriptions Discharge Medication List as of 02/20/2016  9:29 PM       Leo Grosser, MD 02/21/16 [phone]

## 2016-02-20 NOTE — ED Notes (Signed)
MD at bedside. 

## 2016-02-20 NOTE — Telephone Encounter (Signed)
New Message:    Pt says she has an appointment next week.She have been having chest discomfort off and on.She is not sure if it is her heart, she is very concerned.

## 2016-02-20 NOTE — Telephone Encounter (Signed)
Received call from patient.She stated for the past 4 to 5 days she has been having chest discomfort across chest.Stated pain has woke her up at night.Pain seems worse when she takes a deep breath.Stated she is having discomfort now without taking a deep breath.Rates discomfort # 5.Advised she needs to go to ER.Trish called.

## 2016-02-20 NOTE — ED Notes (Signed)
Pt departed in NAD, refused use of wheelchair.  

## 2016-02-21 ENCOUNTER — Encounter (HOSPITAL_COMMUNITY)
Admission: RE | Admit: 2016-02-21 | Discharge: 2016-02-21 | Disposition: A | Discharge status: Home / Self Care | Payer: Medicare Other | Source: Ambulatory Visit | Attending: Cardiology | Admitting: Cardiology

## 2016-02-21 ENCOUNTER — Encounter (HOSPITAL_COMMUNITY): Payer: Medicare Other

## 2016-02-21 DIAGNOSIS — Z8679 Personal history of other diseases of the circulatory system: Secondary | ICD-10-CM | POA: Insufficient documentation

## 2016-02-21 DIAGNOSIS — Z9889 Other specified postprocedural states: Secondary | ICD-10-CM | POA: Insufficient documentation

## 2016-02-24 ENCOUNTER — Encounter (HOSPITAL_COMMUNITY): Payer: Medicare Other

## 2016-02-26 ENCOUNTER — Encounter (HOSPITAL_COMMUNITY): Payer: Medicare Other

## 2016-02-26 ENCOUNTER — Encounter (HOSPITAL_COMMUNITY)
Admission: RE | Admit: 2016-02-26 | Discharge: 2016-02-26 | Disposition: A | Discharge status: Home / Self Care | Payer: Medicare Other | Source: Ambulatory Visit | Attending: Cardiology | Admitting: Cardiology

## 2016-02-26 DIAGNOSIS — Z9889 Other specified postprocedural states: Secondary | ICD-10-CM

## 2016-02-26 DIAGNOSIS — Z8679 Personal history of other diseases of the circulatory system: Secondary | ICD-10-CM | POA: Diagnosis not present

## 2016-02-28 ENCOUNTER — Encounter (HOSPITAL_COMMUNITY)
Admission: RE | Admit: 2016-02-28 | Discharge: 2016-02-28 | Disposition: A | Discharge status: Home / Self Care | Payer: Medicare Other | Source: Ambulatory Visit | Attending: Cardiology | Admitting: Cardiology

## 2016-02-28 ENCOUNTER — Ambulatory Visit (INDEPENDENT_AMBULATORY_CARE_PROVIDER_SITE_OTHER): Payer: Medicare Other | Admitting: Cardiology

## 2016-02-28 ENCOUNTER — Encounter (HOSPITAL_COMMUNITY): Payer: Medicare Other

## 2016-02-28 ENCOUNTER — Encounter: Payer: Self-pay | Admitting: Cardiology

## 2016-02-28 VITALS — BP 122/62 | HR 68 | Ht 69.0 in | Wt 142.0 lb

## 2016-02-28 DIAGNOSIS — I480 Paroxysmal atrial fibrillation: Secondary | ICD-10-CM

## 2016-02-28 DIAGNOSIS — Z8679 Personal history of other diseases of the circulatory system: Secondary | ICD-10-CM

## 2016-02-28 DIAGNOSIS — Z9889 Other specified postprocedural states: Secondary | ICD-10-CM

## 2016-02-28 MED ORDER — AMOXICILLIN 500 MG PO TABS: ORAL_TABLET | ORAL | 4 refills | Status: DC

## 2016-02-28 NOTE — Patient Instructions (Addendum)
Medication Instructions:  Your physician has recommended you make the following change in your medication: Stop amiodarone   Labwork: none  Testing/Procedures: none  Follow-Up: Your physician wants you to follow-up in:4 months with Dr. Meda Coffee. You will receive a reminder letter in the mail two months in advance. If you don't receive a letter, please call our office to schedule the follow-up appointment.   Any Other Special Instructions Will Be Listed Below (If Applicable).     If you need a refill on your cardiac medications before your next appointment, please call your pharmacy.

## 2016-03-01 NOTE — Progress Notes (Signed)
Patient ID: [name], female   DOB: [birth year], 70 y.o.   MRN: [id number] PCP: Dr. [name]  70 yo with history of mitral valve prolapse and mitral regurgitation presents for followup.  Workup was done for mitral regurgitation initially in 2012.  She has bileaflet MV prolapse.  Mitral regurgitation was moderate to severe by TEE in 6/12.  ETT showed excellent exercise tolerance with no evidence for exercise limitation from MR.  We planned to continue monitoring her mitral regurgitation periodically.  Patient was then admitted in 10/12 with severe chest pain while working in her yard.  She had a left heart cath showing only mild luminal irregularities. Echo showed moderate mitral regurgitation.  3 week event monitor showed only PACs.    Patient developed sinus pressure/drainage concerning for sinusitis in 5/16.  During this illness, she noted her heart thumping/beating irregularly.  She developed dyspnea with vigorous walking or vacuuming.  No syncope, lightheadedness, or chest pain.  Symptoms persisted, so she saw Dr [name].  She was found to be atrial fibrillation with mild RVR.  She was started on Toprol XL and Eliquis.  She converted back to NSR.  She wore a 30 day event monitor in 5/16 that showed a total of 6 minutes atrial fibrillation.    I had her do a repeat echo in 4/17.  This showed worsened mitral regurgitation, now severe.  EF remained 60-65%. This was confirmed by TEE.  She developed atrial fibrillation and exertional dyspnea.  In 5/17, she had MV repair and Maze procedure.  Uneventful post-op course.  Today, she is in NSR.  She seems to be doing well overall. She walks for up to 6 miles at a time.  Dyspnea only when walking up hills.  No palpitations.  No BRBPR or melena. She is doing cardiac rehab.   Labs (5/12): TSH normal, LDL 68, HDL 58, cardiac enzymes negative x 3 Labs (4/14): K 4.2, creatinine 0.8, TSH normal, BNP 38 Labs (4/15): LDL 100, HDL 93 Labs (9/15): 37.4 Labs (4/16):  TSH normal Labs (5/16): K 4.1, creatinine 0.86, HCT 38.3 Labs (10/16): K 3.5, creatinine 0.8 Labs (6/17): K 4.1, creatinine 0.75 Labs (8/17): K 4.3, creatinine 0.92, LFTs normal, HCT 45.1  PMH:  1. Mitral valve prolapse: Known since teenager.  Echo (5/12): EF 60%, normal LV size, grade II diastolic dysfunction, myxomatous mitral valve with bileaflet prolapse and moderate to severe mitral regurgitation, mild to moderate TR, PA systolic pressure 39 mmHg.  TEE (6/12) with EF 60%, moderate to severe MR with bileaflet prolapse and no systolic flow reversal in the pulmonary vein doppler signal, mild to moderate LAE.  ETT for exercise capacity (7/12): 12'19" on treadmill, no evidence of symptomatic limitation from mitral regurgitation. Echo (10/12): EF 60-65%, severe MVP with moderate MR.  Echo (4/14) with EF 60-65%, moderate MR with bileaflet MVP and normal RV.  Echo (4/16) with EF 55-60%, MVP with moderate mitral regurgitation, severe LAE.  Echo (4/17) with EF 60-65%, bileaflet mitral valve prolapse with severe MR, moderate TR.   - TEE (5/17) with EF 60-65%, severe MR, bileaflet mitral valve prolapse.  - 11/19/15 minimally invasive mitral valve repair + Maze.  - Echo (7/17): EF 55-60%, s/p mitral valve repair with no significant mitral stenosis or regurgitation.  2. Right shoulder surgery 2012.  3. Pulmonary nodule: followed by Dr. [name] 4. Thyroid nodule s/p biopsy: benign.  5. TAH-BSO due to endometriosis.  6. Appendectomy.  7. Chest pain: Lexiscan myoview (6/12):  EF 63%,  no ischemia or infarction.  Left heart cath (10/12): Mild luminal irregularities only.  LHC (5/17) with 40% pRCA stenosis.  8. Atrial fibrillation: Paroxysmal, 1st noted in 4/16.  Event monitor 5/16 with 6 minutes of atrial fibrillation noted.   9. Carotid dopplers (5/17) with 1-39% BICA stenosis.    SH: Quit smoking at age 28.  Moved to [place] from [place] several years ago.  Married.  2 glasses wine/night.   FH: No CAD or  valvular disease.   ROS: all systems reviewed and negative except as per HPI.   Current Outpatient Prescriptions  Medication Sig Dispense Refill  . atorvastatin (LIPITOR) 20 MG tablet take 1 tablet by mouth daily  6  . desonide (DESOWEN) 0.05 % cream Apply 1 application topically daily as needed. For itching per patient    . ibuprofen (ADVIL,MOTRIN) 200 MG tablet Take 200 mg by mouth every 6 (six) hours as needed for moderate pain.    . metoprolol succinate (TOPROL-XL) 25 MG 24 hr tablet Take 1 tablet (25 mg total) by mouth daily. 30 tablet 3  . nystatin (MYCOSTATIN/NYSTOP) 100000 UNIT/GM POWD Apply 1 application topically daily as needed (skin).   2  . warfarin (COUMADIN) 4 MG tablet Take 2-4 mg by mouth daily. Take 4mg  every day except on sundays take 2mg  per patient    . zolpidem (AMBIEN) 10 MG tablet Take 1 tablet (10 mg total) by mouth at bedtime as needed. for sleep 30 tablet 0  . amoxicillin (AMOXIL) 500 MG tablet Take 4 tablets by mouth 60 minutes prior to dental work 4 tablet 4   No current facility-administered medications for this visit.     BP 122/62   Pulse 68   Ht 5\' 9"  (1.753 m)   Wt 142 lb (64.4 kg)   LMP 06/22/1977   BMI 20.97 kg/m  General: NAD Neck: No JVD, no thyromegaly or thyroid nodule.  Lungs: Clear to auscultation bilaterally with normal respiratory effort. CV: Nondisplaced PMI.  Heart regular S1/S2, no S3/S4, 1/6 SEM RUSB.  No peripheral edema.  No carotid bruit.  Normal pedal pulses.  Abdomen: Soft, nontender, no hepatosplenomegaly, no distention.  Neurologic: Alert and oriented x 3.  Psych: Normal affect. Extremities: No clubbing or cyanosis.   Assessment/Plan: 1. Mitral regurgitation: Now s/p mitral valve repair.  Post-op echo in 7/17 showed normal EF, no significant mitral stenosis or regurgitation.   - She will need antibiotics with dental work.  2. Atrial fibrillation: Paroxysmal.  She has LAE with severe MR, so has substrate for atrial  fibrillation.  CHADSVASC = 2 (gender, age).  She had Maze procedure with MV repair.  Today, she is in NSR.  - Continue anticoagulation with warfarin.    - Continue Toprol XL 25 daily.  - She can stop amiodarone.  - Limit ETOH. - She does not screen positive for OSA.    Given my transition to CHF clinic, will have her followup with Dr Meda Coffee in 4 months.   [name] 03/01/2016

## 2016-03-02 ENCOUNTER — Encounter (HOSPITAL_COMMUNITY): Payer: Medicare Other

## 2016-03-02 ENCOUNTER — Encounter (HOSPITAL_COMMUNITY)
Admission: RE | Admit: 2016-03-02 | Discharge: 2016-03-02 | Disposition: A | Discharge status: Home / Self Care | Payer: Medicare Other | Source: Ambulatory Visit | Attending: Cardiology | Admitting: Cardiology

## 2016-03-02 DIAGNOSIS — Z9889 Other specified postprocedural states: Secondary | ICD-10-CM

## 2016-03-02 DIAGNOSIS — Z8679 Personal history of other diseases of the circulatory system: Secondary | ICD-10-CM | POA: Diagnosis not present

## 2016-03-04 ENCOUNTER — Encounter (HOSPITAL_COMMUNITY): Payer: Medicare Other

## 2016-03-04 ENCOUNTER — Encounter (HOSPITAL_COMMUNITY)
Admission: RE | Admit: 2016-03-04 | Discharge: 2016-03-04 | Disposition: A | Discharge status: Home / Self Care | Payer: Medicare Other | Source: Ambulatory Visit | Attending: Cardiology | Admitting: Cardiology

## 2016-03-04 DIAGNOSIS — Z9889 Other specified postprocedural states: Secondary | ICD-10-CM

## 2016-03-04 DIAGNOSIS — Z8679 Personal history of other diseases of the circulatory system: Secondary | ICD-10-CM

## 2016-03-04 NOTE — Progress Notes (Signed)
Pt resting HR in the low 100's.  Pt remembered that she had not taken her metoprolol this morning.  Pt advised not to exercise today.  Pt advised to take her metoprolol when she returned home.  To return to exercise on Friday. Pt verbalized understanding.  Cherre Huger, BSN

## 2016-03-05 NOTE — Progress Notes (Signed)
Cardiac Individual Treatment Plan  Patient Details  Name: [name] MRN: [id number] Date of Birth: October 03, 1945 Referring Provider:   Flowsheet Row CARDIAC REHAB PHASE II ORIENTATION from 01/14/2016 in [place]  Referring Provider  [name] MD      Initial Encounter Date:  [place] PHASE II ORIENTATION from 01/14/2016 in [place]  Date  01/14/16  Referring Provider  [name] MD      Visit Diagnosis: S/P mitral valve repair  S/P Maze operation for atrial fibrillation  Patient's Home Medications on Admission:  Current Outpatient Prescriptions:  .  amoxicillin (AMOXIL) 500 MG tablet, Take 4 tablets by mouth 60 minutes prior to dental work, Disp: 4 tablet, Rfl: 4 .  atorvastatin (LIPITOR) 20 MG tablet, take 1 tablet by mouth daily, Disp: , Rfl: 6 .  desonide (DESOWEN) 0.05 % cream, Apply 1 application topically daily as needed. For itching per patient, Disp: , Rfl:  .  ibuprofen (ADVIL,MOTRIN) 200 MG tablet, Take 200 mg by mouth every 6 (six) hours as needed for moderate pain., Disp: , Rfl:  .  metoprolol succinate (TOPROL-XL) 25 MG 24 hr tablet, Take 1 tablet (25 mg total) by mouth daily., Disp: 30 tablet, Rfl: 3 .  nystatin (MYCOSTATIN/NYSTOP) 100000 UNIT/GM POWD, Apply 1 application topically daily as needed (skin). , Disp: , Rfl: 2 .  warfarin (COUMADIN) 4 MG tablet, Take 2-4 mg by mouth daily. Take 81m every day except on sundays take 270mper patient, Disp: , Rfl:  .  zolpidem (AMBIEN) 10 MG tablet, Take 1 tablet (10 mg total) by mouth at bedtime as needed. for sleep, Disp: 30 tablet, Rfl: 0  Past Medical History: Past Medical History:  Diagnosis Date  . Heart murmur   . History of melanoma excision   . History of palpitations   . INSOMNIA, CHRONIC 03/29/2009  . Mitral regurgitation    severe by TEE  . MVP (mitral valve prolapse)   . Osteopenia 01/2014   T score -1.8 FRAX  8.8%/1.3%  . Paroxysmal atrial fibrillation ([facility]4/28/2016  . Peripheral vascular disease ([facility]   RT LEG dvt  . Pneumonia 11/17   hx  . PONV (postoperative nausea and vomiting)   . Prepatellar bursitis    RT KNEE  . S/P minimally invasive maze operation for atrial fibrillation 11/19/2015   Complete bilateral atrial lesion set using cryothermy and bipolar radiofrequency ablation with clipping of LA appendage via right mini thoracotomy approch  . S/P minimally invasive mitral valve repair 11/19/2015   Complex valvuloplasty including artificial Gore-tex neochord placement x 22, closure of cleft in posterior leaflet, and 34 mm Edwards Physio II ring annuloplasty via right mini thoracotomy approach  . Skin cancer of arm    Squamous    Tobacco Use: History  Smoking Status  . Former Smoker  . Packs/day: 1.00  . Years: 20.00  . Types: Cigarettes  . Quit date: 08/11/1970  Smokeless Tobacco  . Never Used    Labs: Recent Review Flowsheet Data    Labs for ITP Cardiac and Pulmonary Rehab Latest Ref Rng & Units 11/19/2015 11/19/2015 11/19/2015 11/20/2015 11/20/2015   Cholestrol 0 - 200 mg/dL - - - - -   LDLCALC 0 - 99 mg/dL - - - - -   LDLDIRECT mg/dL - - - - -   HDL >39.00 mg/dL - - - - -   Trlycerides 0.0 - 149.0 mg/dL - - - - -  Hemoglobin A1c 4.8 - 5.6 % - - - - -   PHART 7.350 - 7.450 7.298(L) - 7.282(L) 7.337(L) -   PCO2ART 35.0 - 45.0 mmHg 40.4 - 45.0 43.0 -   HCO3 20.0 - 24.0 mEq/L 19.8(L) - 21.2 23.1 -   TCO2 0 - 100 mmol/L '21 21 22 24 25   ' ACIDBASEDEF 0.0 - 2.0 mmol/L 6.0(H) - 5.0(H) 3.0(H) -   O2SAT % 98.0 - 99.0 92.0 -      Capillary Blood Glucose: Lab Results  Component Value Date   GLUCAP 134 (H) 11/21/2015   GLUCAP 113 (H) 11/21/2015   GLUCAP 127 (H) 11/21/2015   GLUCAP 140 (H) 11/20/2015   GLUCAP 113 (H) 11/20/2015     Exercise Target Goals:    Exercise Program Goal: Individual exercise prescription set with THRR, safety & activity barriers. Participant  demonstrates ability to understand and report RPE using BORG scale, to self-measure pulse accurately, and to acknowledge the importance of the exercise prescription.  Exercise Prescription Goal: Starting with aerobic activity 30 plus minutes a day, 3 days per week for initial exercise prescription. Provide home exercise prescription and guidelines that participant acknowledges understanding prior to discharge.  Activity Barriers & Risk Stratification:     Activity Barriers & Cardiac Risk Stratification - 01/15/16 1524      Activity Barriers & Cardiac Risk Stratification   Activity Barriers None   Cardiac Risk Stratification Moderate      6 Minute Walk:     6 Minute Walk    Row Name 01/14/16 1526         6 Minute Walk   Phase Initial     Distance 1633 feet     Walk Time 6 minutes     # of Rest Breaks 0     MPH 3.09     METS 3.88     RPE 10     VO2 Peak 13.58     Symptoms No     Resting HR 85 bpm     Resting BP 118/72     Max Ex. HR 101 bpm     Max Ex. BP 122/60     2 Minute Post BP 114/92        Initial Exercise Prescription:     Initial Exercise Prescription - 01/15/16 1500      Date of Initial Exercise RX and Referring Provider   Date 01/14/16   Referring Provider [name] MD     Treadmill   MPH 2.8   Grade 2   Minutes 10   METs 3.92     Bike   Level 1   Minutes 10   METs 3.9     NuStep   Level 3   Minutes 10   METs 3     Prescription Details   Frequency (times per week) 3   Duration Progress to 30 minutes of continuous aerobic without signs/symptoms of physical distress     Intensity   THRR 40-80% of Max Heartrate 61-122   Ratings of Perceived Exertion 11-13   Perceived Dyspnea 0-4     Progression   Progression Continue to progress workloads to maintain intensity without signs/symptoms of physical distress.     Resistance Training   Training Prescription Yes   Weight 2 lbs   Reps 10-12      Perform Capillary Blood Glucose  checks as needed.  Exercise Prescription Changes:      Exercise Prescription Changes    Row  Name 02/05/16 0900 02/28/16 1600           Exercise Review   Progression Yes Yes        Response to Exercise   Blood Pressure (Admit) 111/78 100/72      Blood Pressure (Exercise) 130/70 118/70      Blood Pressure (Exit) 103/70 106/72      Heart Rate (Admit) 99 bpm 98 bpm      Heart Rate (Exercise) 126 bpm 125 bpm      Heart Rate (Exit) 100 bpm 102 bpm      Rating of Perceived Exertion (Exercise) 12 11      Symptoms  - none      Comments Reviewed home exercise guidelines on 01/27/16 Reviewed home exercise guidelines on 01/27/16      Duration Progress to 30 minutes of continuous aerobic without signs/symptoms of physical distress Progress to 30 minutes of continuous aerobic without signs/symptoms of physical distress      Intensity THRR unchanged THRR unchanged        Progression   Progression Continue to progress workloads to maintain intensity without signs/symptoms of physical distress. Continue to progress workloads to maintain intensity without signs/symptoms of physical distress.      Average METs 3.9 4.6        Resistance Training   Training Prescription  - Yes      Weight 4 lbs 4 lbs      Reps  - 10-12        Interval Training   Interval Training No No        Treadmill   MPH 3 3.5      Grade 2 3      Minutes 10 10      METs 4.12 5.13        Bike   Level 1.2 1.5      Minutes 10 10      METs 4.5 5.36        NuStep   Level 4 5      Minutes 10 10      METs 3.1 3.3        Home Exercise Plan   Plans to continue exercise at [place] 4 additional days to program exercise sessions. Add 4 additional days to program exercise sessions.         Exercise Comments:      Exercise Comments    Row Name 01/27/16 1210 02/03/16 1646 02/28/16 1629       Exercise Comments Reviewed home exercise guidelines with patient. Off to a good start with exercise.  Patient doing well with exercise. Walking daily 45-60 minutes. Making excellent progress with exercise.        Discharge Exercise Prescription (Final Exercise Prescription Changes):     Exercise Prescription Changes - 02/28/16 1600      Exercise Review   Progression Yes     Response to Exercise   Blood Pressure (Admit) 100/72   Blood Pressure (Exercise) 118/70   Blood Pressure (Exit) 106/72   Heart Rate (Admit) 98 bpm   Heart Rate (Exercise) 125 bpm   Heart Rate (Exit) 102 bpm   Rating of Perceived Exertion (Exercise) 11   Symptoms none   Comments Reviewed home exercise guidelines on 01/27/16   Duration Progress to 30 minutes of continuous aerobic without signs/symptoms of physical distress   Intensity THRR unchanged     Progression   Progression Continue to  progress workloads to maintain intensity without signs/symptoms of physical distress.   Average METs 4.6     Resistance Training   Training Prescription Yes   Weight 4 lbs   Reps 10-12     Interval Training   Interval Training No     Treadmill   MPH 3.5   Grade 3   Minutes 10   METs 5.13     Bike   Level 1.5   Minutes 10   METs 5.36     NuStep   Level 5   Minutes 10   METs 3.3     Home Exercise Plan   Plans to continue exercise at Home   Frequency Add 4 additional days to program exercise sessions.      Nutrition:  Target Goals: Understanding of nutrition guidelines, daily intake of sodium <1565m, cholesterol <2071m calories 30% from fat and 7% or less from saturated fats, daily to have 5 or more servings of fruits and vegetables.  Biometrics:     Pre Biometrics - 01/15/16 1521      Pre Biometrics   Height '5\' 7"'  (1.702 m)   Weight 143 lb 1.3 oz (64.9 kg)   Waist Circumference 30.25 inches   Hip Circumference 38.25 inches   Waist to Hip Ratio 0.79 %   BMI (Calculated) 22.5   Triceps Skinfold 25 mm   % Body Fat 23.2 %   Grip Strength 26.5 kg   Flexibility 16.5 in   Single Leg Stand 30  seconds       Nutrition Therapy Plan and Nutrition Goals:   Nutrition Discharge: Nutrition Scores:     Nutrition Assessments - 01/16/16 1512      MEDFICTS Scores   Pre Score 18  will verify score with pt      Nutrition Goals Re-Evaluation:   Psychosocial: Target Goals: Acknowledge presence or absence of depression, maximize coping skills, provide positive support system. Participant is able to verbalize types and ability to use techniques and skills needed for reducing stress and depression.  Initial Review & Psychosocial Screening:     Initial Psych Review & Screening - 01/14/16 16[place]Yes     Barriers   Psychosocial barriers to participate in program The patient should benefit from training in stress management and relaxation.     Screening Interventions   Interventions Encouraged to exercise      Quality of Life Scores:     Quality of Life - 01/15/16 1525      Quality of Life Scores   Health/Function Pre 20.48 %   Socioeconomic Pre 20 %   Psych/Spiritual Pre 24 %   Family Pre 28.8 %   GLOBAL Pre 22.52 %      PHQ-9: Recent Review Flowsheet Data    [facility] 01/22/2016 01/04/2015 10/19/2013   Decreased Interest 0 0 0   Down, Depressed, Hopeless 0 0 0   PHQ - 2 Score 0 0 0      Psychosocial Evaluation and Intervention:     Psychosocial Evaluation - 03/05/16 1857      Psychosocial Evaluation & Interventions   Interventions Encouraged to exercise with the program and follow exercise prescription   Comments Psychosocial Assessment reveals no identifiable issues, no further intervention warranted      Psychosocial Re-Evaluation:     Psychosocial Re-Evaluation    [place] 02/10/16 1652 03/05/16 1857  Psychosocial Re-Evaluation   Interventions Encouraged to attend Cardiac Rehabilitation for the exercise Encouraged to attend Cardiac Rehabilitation for the exercise       Comments No needs identified, no further intervention warranted No needs identified, no further intervention warranted         Vocational Rehabilitation: Provide vocational rehab assistance to qualifying candidates.   Vocational Rehab Evaluation & Intervention:     Vocational Rehab - 01/14/16 1608      Initial Vocational Rehab Evaluation & Intervention   Assessment shows need for Vocational Rehabilitation No      Education: Education Goals: Education classes will be provided on a weekly basis, covering required topics. Participant will state understanding/return demonstration of topics presented.  Learning Barriers/Preferences:     Learning Barriers/Preferences - 01/16/16 1131      Learning Barriers/Preferences   Learning Barriers Sight      Education Topics: Count Your Pulse:  -Group instruction provided by verbal instruction, demonstration, patient participation and written materials to support subject.  Instructors address importance of being able to find your pulse and how to count your pulse when at home without a heart monitor.  Patients get hands on experience counting their pulse with staff help and individually. Flowsheet Row CARDIAC REHAB PHASE II EXERCISE from 02/21/2016 in [place]  Date  02/21/16  Educator  Andi Hence, RN  Instruction Review Code  2- meets goals/outcomes      Heart Attack, Angina, and Risk Factor Modification:  -Group instruction provided by verbal instruction, video, and written materials to support subject.  Instructors address signs and symptoms of angina and heart attacks.    Also discuss risk factors for heart disease and how to make changes to improve heart health risk factors.   Functional Fitness:  -Group instruction provided by verbal instruction, demonstration, patient participation, and written materials to support subject.  Instructors address safety measures for doing things around the house.   Discuss how to get up and down off the floor, how to pick things up properly, how to safely get out of a chair without assistance, and balance training.   Meditation and Mindfulness:  -Group instruction provided by verbal instruction, patient participation, and written materials to support subject.  Instructor addresses importance of mindfulness and meditation practice to help reduce stress and improve awareness.  Instructor also leads participants through a meditation exercise.    Stretching for Flexibility and Mobility:  -Group instruction provided by verbal instruction, patient participation, and written materials to support subject.  Instructors lead participants through series of stretches that are designed to increase flexibility thus improving mobility.  These stretches are additional exercise for major muscle groups that are typically performed during regular warm up and cool down.   Hands Only CPR Anytime:  -Group instruction provided by verbal instruction, video, patient participation and written materials to support subject.  Instructors co-teach with AHA video for hands only CPR.  Participants get hands on experience with mannequins.   Nutrition I class: Heart Healthy Eating:  -Group instruction provided by PowerPoint slides, verbal discussion, and written materials to support subject matter. The instructor gives an explanation and review of the Therapeutic Lifestyle Changes diet recommendations, which includes a discussion on lipid goals, dietary fat, sodium, fiber, plant stanol/sterol esters, sugar, and the components of a well-balanced, healthy diet.   Nutrition II class: Lifestyle Skills:  -Group instruction provided by PowerPoint slides, verbal discussion, and written materials to support subject matter. The instructor gives an explanation  and review of label reading, grocery shopping for heart health, heart healthy recipe modifications, and ways to make healthier choices when  eating out.   Diabetes Question & Answer:  -Group instruction provided by PowerPoint slides, verbal discussion, and written materials to support subject matter. The instructor gives an explanation and review of diabetes co-morbidities, pre- and post-prandial blood glucose goals, pre-exercise blood glucose goals, signs, symptoms, and treatment of hypoglycemia and hyperglycemia, and foot care basics.   Diabetes Blitz:  -Group instruction provided by PowerPoint slides, verbal discussion, and written materials to support subject matter. The instructor gives an explanation and review of the physiology behind type 1 and type 2 diabetes, diabetes medications and rational behind using different medications, pre- and post-prandial blood glucose recommendations and Hemoglobin A1c goals, diabetes diet, and exercise including blood glucose guidelines for exercising safely.    Portion Distortion:  -Group instruction provided by PowerPoint slides, verbal discussion, written materials, and food models to support subject matter. The instructor gives an explanation of serving size versus portion size, changes in portions sizes over the last 20 years, and what consists of a serving from each food group.   Stress Management:  -Group instruction provided by verbal instruction, video, and written materials to support subject matter.  Instructors review role of stress in heart disease and how to cope with stress positively.   Flowsheet Row CARDIAC REHAB PHASE II EXERCISE from 02/21/2016 in [place]  Date  01/22/16  Instruction Review Code  2- meets goals/outcomes      Exercising on Your Own:  -Group instruction provided by verbal instruction, power point, and written materials to support subject.  Instructors discuss benefits of exercise, components of exercise, frequency and intensity of exercise, and end points for exercise.  Also discuss use of nitroglycerin and activating EMS.   Review options of places to exercise outside of rehab.  Review guidelines for sex with heart disease.   Cardiac Drugs I:  -Group instruction provided by verbal instruction and written materials to support subject.  Instructor reviews cardiac drug classes: antiplatelets, anticoagulants, beta blockers, and statins.  Instructor discusses reasons, side effects, and lifestyle considerations for each drug class.   Cardiac Drugs II:  -Group instruction provided by verbal instruction and written materials to support subject.  Instructor reviews cardiac drug classes: angiotensin converting enzyme inhibitors (ACE-I), angiotensin II receptor blockers (ARBs), nitrates, and calcium channel blockers.  Instructor discusses reasons, side effects, and lifestyle considerations for each drug class.   Anatomy and Physiology of the Circulatory System:  -Group instruction provided by verbal instruction, video, and written materials to support subject.  Reviews functional anatomy of heart, how it relates to various diagnoses, and what role the heart plays in the overall system.   Knowledge Questionnaire Score:   Core Components/Risk Factors/Patient Goals at Admission:     Personal Goals and Risk Factors at Admission - 01/14/16 1600      Core Components/Risk Factors/Patient Goals on Admission   Personal Goal Other Yes   Personal Goal get back to previous activities ie walking and tennis    Intervention monitored cardiac rehab exercise, home exercise education   Expected Outcomes achievement of increased cardiorespiratory fitness and enhanced flexability, muscular endurance and strength shown through measurements of functional capacity and personal statement of participation.       Core Components/Risk Factors/Patient Goals Review:      Goals and Risk Factor Review    Row Name 02/03/16 1647 03/04/16 1450  Core Components/Risk Factors/Patient Goals Review   Personal Goals Review Other Other       Review Patient doing well with exercise, walking daily in addition to cardiac rehab. Energy level is about the same.  Patient doing well with exercise program. Walking at home and back to playing tennis.      Expected Outcomes Progress work loads to improve strength and stamina. Achieve improved cardiorespiratory fitness through regular exercise routine.         Core Components/Risk Factors/Patient Goals at Discharge (Final Review):      Goals and Risk Factor Review - 03/04/16 1450      Core Components/Risk Factors/Patient Goals Review   Personal Goals Review Other   Review Patient doing well with exercise program. Walking at home and back to playing tennis.   Expected Outcomes Achieve improved cardiorespiratory fitness through regular exercise routine.      ITP Comments:     ITP Comments    Row Name 01/14/16 1330 01/24/16 1113         ITP Comments Medical Director- Dr. Fransico Him, MD attended Hypertension education video/lecture class, met outcomes/goals         Comments:  Pt is making expected progress toward personal goals after completing  18 sessions. Recommend continued exercise and life style modification education including  stress management and relaxation techniques to decrease cardiac risk profile.  Psychosocial assessment remains unchanged.  No further needs identified. No further interventions are warranted at this time. Cherre Huger, BSN

## 2016-03-06 ENCOUNTER — Ambulatory Visit (INDEPENDENT_AMBULATORY_CARE_PROVIDER_SITE_OTHER): Payer: Medicare Other | Admitting: Pharmacist

## 2016-03-06 ENCOUNTER — Encounter (HOSPITAL_COMMUNITY): Payer: Medicare Other

## 2016-03-06 ENCOUNTER — Encounter (HOSPITAL_COMMUNITY)
Admission: RE | Admit: 2016-03-06 | Discharge: 2016-03-06 | Disposition: A | Discharge status: Home / Self Care | Payer: Medicare Other | Source: Ambulatory Visit | Attending: Cardiology | Admitting: Cardiology

## 2016-03-06 ENCOUNTER — Encounter (INDEPENDENT_AMBULATORY_CARE_PROVIDER_SITE_OTHER): Payer: Self-pay

## 2016-03-06 DIAGNOSIS — Z9889 Other specified postprocedural states: Secondary | ICD-10-CM

## 2016-03-06 DIAGNOSIS — Z8679 Personal history of other diseases of the circulatory system: Secondary | ICD-10-CM

## 2016-03-06 DIAGNOSIS — Z5181 Encounter for therapeutic drug level monitoring: Secondary | ICD-10-CM

## 2016-03-06 DIAGNOSIS — I480 Paroxysmal atrial fibrillation: Secondary | ICD-10-CM

## 2016-03-06 LAB — POCT INR: INR: 1.4

## 2016-03-09 ENCOUNTER — Encounter (HOSPITAL_COMMUNITY): Payer: Medicare Other

## 2016-03-09 ENCOUNTER — Encounter (HOSPITAL_COMMUNITY)
Admission: RE | Admit: 2016-03-09 | Discharge: 2016-03-09 | Disposition: A | Discharge status: Home / Self Care | Payer: Medicare Other | Source: Ambulatory Visit | Attending: Cardiology | Admitting: Cardiology

## 2016-03-09 DIAGNOSIS — Z8679 Personal history of other diseases of the circulatory system: Secondary | ICD-10-CM

## 2016-03-09 DIAGNOSIS — Z9889 Other specified postprocedural states: Secondary | ICD-10-CM | POA: Diagnosis not present

## 2016-03-09 NOTE — Progress Notes (Signed)
[name] 70 y.o. female Nutrition Note Spoke with pt. Pt states she would like to lose 5 lb, but she is not actively trying to lose wt at this time. Nutrition Survey reviewed with pt. Pt is following Step 2 of the Therapeutic Lifestyle Changes diet. Pt expressed understanding of the information reviewed. Pt aware of nutrition education classes offered. Lab Results  Component Value Date   HGBA1C 5.6 11/15/2015   Wt Readings from Last 3 Encounters:  02/28/16 142 lb (64.4 kg)  02/20/16 140 lb (63.5 kg)  01/27/16 140 lb (63.5 kg)   Nutrition Diagnosis ? Food-and nutrition-related knowledge deficit related to lack of exposure to information as related to diagnosis of: ? CVD Nutrition Intervention ? Benefits of adopting Therapeutic Lifestyle Changes discussed when Medficts reviewed. ? Pt to attend the Portion Distortion class ? Pt given handouts for: ? Nutrition I class ? Nutrition II class  ? Continue client-centered nutrition education by RD, as part of interdisciplinary care.  Goal(s) ? Pt to describe the benefit of including fruits, vegetables, whole grains, and low-fat dairy products in a heart healthy meal plan.  Monitor and Evaluate progress toward nutrition goal with team.  Derek Mound, M.Ed, RD, LDN, CDE 03/09/2016 12:06 PM

## 2016-03-10 ENCOUNTER — Telehealth: Payer: Self-pay | Admitting: Family Medicine

## 2016-03-10 NOTE — Telephone Encounter (Signed)
Last refill was 02-12-2016 #30 Please advise

## 2016-03-10 NOTE — Telephone Encounter (Signed)
° ° ° °  Pt request refill of the following:   zolpidem (AMBIEN) 10 MG tablet   Phamacy:  [facility]

## 2016-03-11 ENCOUNTER — Encounter (HOSPITAL_COMMUNITY): Payer: Medicare Other

## 2016-03-11 ENCOUNTER — Telehealth: Payer: Self-pay | Admitting: Emergency Medicine

## 2016-03-11 ENCOUNTER — Encounter (HOSPITAL_COMMUNITY)
Admission: RE | Admit: 2016-03-11 | Discharge: 2016-03-11 | Disposition: A | Discharge status: Home / Self Care | Payer: Medicare Other | Source: Ambulatory Visit | Attending: Cardiology | Admitting: Cardiology

## 2016-03-11 ENCOUNTER — Other Ambulatory Visit: Payer: Self-pay | Admitting: Emergency Medicine

## 2016-03-11 DIAGNOSIS — Z9889 Other specified postprocedural states: Secondary | ICD-10-CM

## 2016-03-11 DIAGNOSIS — Z8679 Personal history of other diseases of the circulatory system: Secondary | ICD-10-CM

## 2016-03-11 MED ORDER — ZOLPIDEM TARTRATE 10 MG PO TABS: 10.0000 mg | ORAL_TABLET | Freq: Every evening | ORAL | 2 refills | Status: DC | PRN

## 2016-03-11 NOTE — Telephone Encounter (Signed)
Medication was verbally called in for patient.

## 2016-03-11 NOTE — Telephone Encounter (Signed)
Medication was verbally called into the pharmacy.

## 2016-03-11 NOTE — Telephone Encounter (Signed)
Refill with 2 additional refills. 

## 2016-03-11 NOTE — Telephone Encounter (Signed)
Pt requesting refill Zolpidem 10mg   Last filled 02/12/16 #30 refills 0   Last OV 11/06/15  Okay to refill?

## 2016-03-13 ENCOUNTER — Encounter (HOSPITAL_COMMUNITY): Payer: Medicare Other

## 2016-03-13 ENCOUNTER — Encounter (HOSPITAL_COMMUNITY)
Admission: RE | Admit: 2016-03-13 | Discharge: 2016-03-13 | Disposition: A | Discharge status: Home / Self Care | Payer: Medicare Other | Source: Ambulatory Visit | Attending: Cardiology | Admitting: Cardiology

## 2016-03-13 DIAGNOSIS — Z9889 Other specified postprocedural states: Secondary | ICD-10-CM | POA: Diagnosis not present

## 2016-03-13 DIAGNOSIS — Z8679 Personal history of other diseases of the circulatory system: Secondary | ICD-10-CM

## 2016-03-16 ENCOUNTER — Encounter (HOSPITAL_COMMUNITY)
Admission: RE | Admit: 2016-03-16 | Discharge: 2016-03-16 | Disposition: A | Discharge status: Home / Self Care | Payer: Medicare Other | Source: Ambulatory Visit | Attending: Cardiology | Admitting: Cardiology

## 2016-03-16 ENCOUNTER — Encounter (HOSPITAL_COMMUNITY): Payer: Medicare Other

## 2016-03-16 DIAGNOSIS — Z9889 Other specified postprocedural states: Secondary | ICD-10-CM | POA: Diagnosis not present

## 2016-03-16 DIAGNOSIS — Z8679 Personal history of other diseases of the circulatory system: Secondary | ICD-10-CM | POA: Diagnosis not present

## 2016-03-17 ENCOUNTER — Ambulatory Visit (INDEPENDENT_AMBULATORY_CARE_PROVIDER_SITE_OTHER): Payer: Medicare Other | Admitting: Family Medicine

## 2016-03-17 VITALS — BP 100/80 | HR 102 | Temp 97.7°F | Ht 69.0 in | Wt 140.0 lb

## 2016-03-17 DIAGNOSIS — J0100 Acute maxillary sinusitis, unspecified: Secondary | ICD-10-CM | POA: Diagnosis not present

## 2016-03-17 MED ORDER — AMOXICILLIN-POT CLAVULANATE 875-125 MG PO TABS: 1.0000 | ORAL_TABLET | Freq: Two times a day (BID) | ORAL | 0 refills | Status: DC

## 2016-03-17 NOTE — Progress Notes (Signed)
Pre visit review using our clinic review tool, if applicable. No additional management support is needed unless otherwise documented below in the visit note. 

## 2016-03-17 NOTE — Progress Notes (Signed)
Subjective:     Patient ID: [name], female   DOB: 1946-04-10, 70 y.o.   MRN: [id number]  HPI Patient seen with concern for possible sinusitis. She's had almost two week of nasal congestion, headaches, maxillary facial pain, upper teeth pain. She has some postnasal drainage but rarely blows out of her nose. She's not had any nosebleeds recently. Had history of frequent sinusitis the past. No reported fevers. Patient has prior history of minimally invasive mitral valve repair and also past history of atrial fibrillation. She's not any recent chest pains or dizziness. She is going to cardiac rehabilitation program.  Past Medical History:  Diagnosis Date  . Heart murmur   . History of melanoma excision   . History of palpitations   . INSOMNIA, CHRONIC 03/29/2009  . Mitral regurgitation    severe by TEE  . MVP (mitral valve prolapse)   . Osteopenia 01/2014   T score -1.8 FRAX 8.8%/1.3%  . Paroxysmal atrial fibrillation ([place]) 10/18/2014  . Peripheral vascular disease ([place]) 10/16   RT LEG dvt  . Pneumonia 11/17   hx  . PONV (postoperative nausea and vomiting)   . Prepatellar bursitis    RT KNEE  . S/P minimally invasive maze operation for atrial fibrillation 11/19/2015   Complete bilateral atrial lesion set using cryothermy and bipolar radiofrequency ablation with clipping of LA appendage via right mini thoracotomy approch  . S/P minimally invasive mitral valve repair 11/19/2015   Complex valvuloplasty including artificial Gore-tex neochord placement x 22, closure of cleft in posterior leaflet, and 34 mm Edwards Physio II ring annuloplasty via right mini thoracotomy approach  . Skin cancer of arm    Squamous   Past Surgical History:  Procedure Laterality Date  . ABDOMINAL HYSTERECTOMY  1980   WITH BSO/FIBROIDS  . APPENDECTOMY  1969  . CARDIAC CATHETERIZATION N/A 10/21/2015   Procedure: Right/Left Heart Cath and Coronary Angiography;  Surgeon: Larey Dresser, MD;  Location: [facility];  Service: Cardiovascular;  Laterality: N/A;  . CLIPPING OF ATRIAL APPENDAGE N/A 11/19/2015   Procedure: CLIPPING OF ATRIAL APPENDAGE;  Surgeon: Rexene Alberts, MD;  Location: [facility];  Service: Open Heart Surgery;  Laterality: N/A;  . COLONOSCOPY    . DIAGNOSTIC LAPAROSCOPY     expl  . EXCISION MELANOMA WITH SENTINEL LYMPH NODE BIOPSY Right 10/02/2013   Procedure: WIDE EXCISION MELANOMA RIGHT ARM WITH SENTINEL LYMPH NODE BIOPSY;  Surgeon: Shann Medal, MD;  Location: [facility];  Service: General;  Laterality: Right;  . KNEE BURSECTOMY Right 03/05/2014   Procedure: RIGHT KNEE BURSECTOMY;  Surgeon: Gearlean Alf, MD;  Location: [facility];  Service: Orthopedics;  Laterality: Right;  . MINIMALLY INVASIVE MAZE PROCEDURE N/A 11/19/2015   Procedure: MINIMALLY INVASIVE MAZE PROCEDURE;  Surgeon: Rexene Alberts, MD;  Location: [facility];  Service: Open Heart Surgery;  Laterality: N/A;  . MITRAL VALVE REPAIR Right 11/19/2015   Procedure: MINIMALLY INVASIVE MITRAL VALVE REPAIR (MVR);  Surgeon: Rexene Alberts, MD;  Location: [facility];  Service: Open Heart Surgery;  Laterality: Right;  . OOPHORECTOMY     BSO WITH ABDOMINAL HYSTERECTOMY  . SHOULDER ARTHROSCOPY  2010   right  . TEE WITHOUT CARDIOVERSION N/A 10/21/2015   Procedure: TRANSESOPHAGEAL ECHOCARDIOGRAM (TEE);  Surgeon: Larey Dresser, MD;  Location: [facility];  Service: Cardiovascular;  Laterality: N/A;  . TEE WITHOUT CARDIOVERSION N/A 11/19/2015   Procedure: TRANSESOPHAGEAL ECHOCARDIOGRAM (TEE);  Surgeon: Rexene Alberts, MD;  Location: [facility];  Service: Open Heart Surgery;  Laterality: N/A;  . THYROID CYST EXCISION  05-01-11   RIGHT  . TONSILLECTOMY  1980    reports that she quit smoking about 45 years ago. Her smoking use included Cigarettes. She has a 20.00 pack-year smoking history. She has never used smokeless tobacco. She reports that she drinks about 4.2 oz of alcohol per week . She reports that she does not use  drugs. family history includes Alcohol abuse in her father; Breast cancer in her maternal grandmother; Diabetes type II in her mother; Ovarian cancer in her maternal aunt; Throat cancer in her father. Allergies  Allergen Reactions  . Inderal [Propranolol Hcl] Itching    SEVERE LIP(INSIDE ITCH) AND SKIN DRYNESS  . Latex Itching    Sensitive to latex  . Tape Other (See Comments)    Very sensitive skin-     Review of Systems  Constitutional: Positive for fatigue. Negative for chills and fever.  HENT: Positive for congestion and sinus pressure.   Respiratory: Negative for cough.   Cardiovascular: Negative for chest pain.       Objective:   Physical Exam  Constitutional: She appears well-developed and well-nourished.  HENT:  Right Ear: External ear normal.  Left Ear: External ear normal.  Mouth/Throat: Oropharynx is clear and moist.  Neck: Neck supple.  Cardiovascular: Normal rate and regular rhythm.   Pulmonary/Chest: Effort normal and breath sounds normal. No respiratory distress. She has no wheezes. She has no rales.  Lymphadenopathy:    She has no cervical adenopathy.       Assessment:     Probable acute sinusitis    Plan:     Augmentin 875 mg twice daily for 10 days Continue saline nasal irrigation Follow-up as needed  [name] MD [facility]

## 2016-03-18 ENCOUNTER — Encounter (HOSPITAL_COMMUNITY): Payer: Medicare Other

## 2016-03-18 ENCOUNTER — Encounter (HOSPITAL_COMMUNITY)
Admission: RE | Admit: 2016-03-18 | Discharge: 2016-03-18 | Disposition: A | Discharge status: Home / Self Care | Payer: Medicare Other | Source: Ambulatory Visit | Attending: Cardiology | Admitting: Cardiology

## 2016-03-18 DIAGNOSIS — Z8679 Personal history of other diseases of the circulatory system: Secondary | ICD-10-CM | POA: Diagnosis not present

## 2016-03-18 DIAGNOSIS — Z9889 Other specified postprocedural states: Secondary | ICD-10-CM | POA: Diagnosis not present

## 2016-03-20 ENCOUNTER — Encounter (HOSPITAL_COMMUNITY): Payer: Medicare Other

## 2016-03-20 ENCOUNTER — Encounter (HOSPITAL_COMMUNITY)
Admission: RE | Admit: 2016-03-20 | Discharge: 2016-03-20 | Disposition: A | Discharge status: Home / Self Care | Payer: Medicare Other | Source: Ambulatory Visit | Attending: Cardiology | Admitting: Cardiology

## 2016-03-20 ENCOUNTER — Ambulatory Visit (INDEPENDENT_AMBULATORY_CARE_PROVIDER_SITE_OTHER): Payer: Medicare Other | Admitting: *Deleted

## 2016-03-20 DIAGNOSIS — Z8679 Personal history of other diseases of the circulatory system: Secondary | ICD-10-CM

## 2016-03-20 DIAGNOSIS — Z5181 Encounter for therapeutic drug level monitoring: Secondary | ICD-10-CM

## 2016-03-20 DIAGNOSIS — I480 Paroxysmal atrial fibrillation: Secondary | ICD-10-CM | POA: Diagnosis not present

## 2016-03-20 DIAGNOSIS — Z9889 Other specified postprocedural states: Secondary | ICD-10-CM | POA: Diagnosis not present

## 2016-03-20 LAB — POCT INR: INR: 1.4

## 2016-03-23 ENCOUNTER — Encounter (HOSPITAL_COMMUNITY): Payer: Medicare Other

## 2016-03-25 ENCOUNTER — Encounter (HOSPITAL_COMMUNITY): Payer: Medicare Other

## 2016-03-27 ENCOUNTER — Encounter (HOSPITAL_COMMUNITY): Payer: Medicare Other

## 2016-03-30 ENCOUNTER — Ambulatory Visit (INDEPENDENT_AMBULATORY_CARE_PROVIDER_SITE_OTHER): Payer: Medicare Other | Admitting: *Deleted

## 2016-03-30 ENCOUNTER — Encounter (HOSPITAL_COMMUNITY): Payer: Medicare Other

## 2016-03-30 ENCOUNTER — Encounter (HOSPITAL_COMMUNITY)
Admission: RE | Admit: 2016-03-30 | Discharge: 2016-03-30 | Disposition: A | Discharge status: Home / Self Care | Payer: Medicare Other | Source: Ambulatory Visit | Attending: Cardiology | Admitting: Cardiology

## 2016-03-30 DIAGNOSIS — I480 Paroxysmal atrial fibrillation: Secondary | ICD-10-CM | POA: Diagnosis not present

## 2016-03-30 DIAGNOSIS — Z9889 Other specified postprocedural states: Secondary | ICD-10-CM | POA: Diagnosis not present

## 2016-03-30 DIAGNOSIS — I4891 Unspecified atrial fibrillation: Secondary | ICD-10-CM | POA: Diagnosis not present

## 2016-03-30 DIAGNOSIS — Z7901 Long term (current) use of anticoagulants: Secondary | ICD-10-CM | POA: Diagnosis not present

## 2016-03-30 DIAGNOSIS — Z8679 Personal history of other diseases of the circulatory system: Secondary | ICD-10-CM

## 2016-03-30 DIAGNOSIS — Z5181 Encounter for therapeutic drug level monitoring: Secondary | ICD-10-CM | POA: Diagnosis not present

## 2016-03-30 LAB — POCT INR: INR: 1.9

## 2016-04-01 ENCOUNTER — Encounter (HOSPITAL_COMMUNITY): Payer: Medicare Other

## 2016-04-01 ENCOUNTER — Encounter (HOSPITAL_COMMUNITY)
Admission: RE | Admit: 2016-04-01 | Discharge: 2016-04-01 | Disposition: A | Discharge status: Home / Self Care | Payer: Medicare Other | Source: Ambulatory Visit | Attending: Cardiology | Admitting: Cardiology

## 2016-04-01 DIAGNOSIS — Z9889 Other specified postprocedural states: Secondary | ICD-10-CM

## 2016-04-01 DIAGNOSIS — I4891 Unspecified atrial fibrillation: Secondary | ICD-10-CM | POA: Diagnosis not present

## 2016-04-01 DIAGNOSIS — Z8679 Personal history of other diseases of the circulatory system: Secondary | ICD-10-CM

## 2016-04-01 DIAGNOSIS — Z7901 Long term (current) use of anticoagulants: Secondary | ICD-10-CM | POA: Diagnosis not present

## 2016-04-02 NOTE — Progress Notes (Signed)
Cardiac Individual Treatment Plan  Patient Details  Name: [name] MRN: [id number] Date of Birth: Apr 10, 1946 Referring Provider:   Flowsheet Row CARDIAC REHAB PHASE II ORIENTATION from 01/14/2016 in [place]  Referring Provider  [name] MD      Initial Encounter Date:  [place] PHASE II ORIENTATION from 01/14/2016 in [facility]  Date  01/14/16  Referring Provider  [name] MD      Visit Diagnosis: S/P Maze operation for atrial fibrillation  S/P mitral valve repair  Patient's Home Medications on Admission:  Current Outpatient Prescriptions:  .  amoxicillin-clavulanate (AUGMENTIN) 875-125 MG tablet, Take 1 tablet by mouth 2 (two) times daily., Disp: 20 tablet, Rfl: 0 .  atorvastatin (LIPITOR) 20 MG tablet, take 1 tablet by mouth daily, Disp: , Rfl: 6 .  desonide (DESOWEN) 0.05 % cream, Apply 1 application topically daily as needed. For itching per patient, Disp: , Rfl:  .  ibuprofen (ADVIL,MOTRIN) 200 MG tablet, Take 200 mg by mouth every 6 (six) hours as needed for moderate pain., Disp: , Rfl:  .  metoprolol succinate (TOPROL-XL) 25 MG 24 hr tablet, Take 1 tablet (25 mg total) by mouth daily., Disp: 30 tablet, Rfl: 3 .  nystatin (MYCOSTATIN/NYSTOP) 100000 UNIT/GM POWD, Apply 1 application topically daily as needed (skin). , Disp: , Rfl: 2 .  warfarin (COUMADIN) 4 MG tablet, Take 2-4 mg by mouth daily. Take 11m every day except on sundays take 246mper patient, Disp: , Rfl:  .  zolpidem (AMBIEN) 10 MG tablet, Take 1 tablet (10 mg total) by mouth at bedtime as needed. for sleep, Disp: 30 tablet, Rfl: 2  Past Medical History: Past Medical History:  Diagnosis Date  . Heart murmur   . History of melanoma excision   . History of palpitations   . INSOMNIA, CHRONIC 03/29/2009  . Mitral regurgitation    severe by TEE  . MVP (mitral valve prolapse)   . Osteopenia 01/2014   T score -1.8  FRAX 8.8%/1.3%  . Paroxysmal atrial fibrillation ([facility]4/28/2016  . Peripheral vascular disease ([place]   RT LEG dvt  . Pneumonia 11/17   hx  . PONV (postoperative nausea and vomiting)   . Prepatellar bursitis    RT KNEE  . S/P minimally invasive maze operation for atrial fibrillation 11/19/2015   Complete bilateral atrial lesion set using cryothermy and bipolar radiofrequency ablation with clipping of LA appendage via right mini thoracotomy approch  . S/P minimally invasive mitral valve repair 11/19/2015   Complex valvuloplasty including artificial Gore-tex neochord placement x 22, closure of cleft in posterior leaflet, and 34 mm Edwards Physio II ring annuloplasty via right mini thoracotomy approach  . Skin cancer of arm    Squamous    Tobacco Use: History  Smoking Status  . Former Smoker  . Packs/day: 1.00  . Years: 20.00  . Types: Cigarettes  . Quit date: 08/11/1970  Smokeless Tobacco  . Never Used    Labs: Recent Review Flowsheet Data    Labs for ITP Cardiac and Pulmonary Rehab Latest Ref Rng & Units 11/19/2015 11/19/2015 11/19/2015 11/20/2015 11/20/2015   Cholestrol 0 - 200 mg/dL - - - - -   LDLCALC 0 - 99 mg/dL - - - - -   LDLDIRECT mg/dL - - - - -   HDL >39.00 mg/dL - - - - -   Trlycerides 0.0 - 149.0 mg/dL - - - - -  Hemoglobin A1c 4.8 - 5.6 % - - - - -   PHART 7.350 - 7.450 7.298(L) - 7.282(L) 7.337(L) -   PCO2ART 35.0 - 45.0 mmHg 40.4 - 45.0 43.0 -   HCO3 20.0 - 24.0 mEq/L 19.8(L) - 21.2 23.1 -   TCO2 0 - 100 mmol/L '21 21 22 24 25   ' ACIDBASEDEF 0.0 - 2.0 mmol/L 6.0(H) - 5.0(H) 3.0(H) -   O2SAT % 98.0 - 99.0 92.0 -      Capillary Blood Glucose: Lab Results  Component Value Date   GLUCAP 134 (H) 11/21/2015   GLUCAP 113 (H) 11/21/2015   GLUCAP 127 (H) 11/21/2015   GLUCAP 140 (H) 11/20/2015   GLUCAP 113 (H) 11/20/2015     Exercise Target Goals:    Exercise Program Goal: Individual exercise prescription set with THRR, safety & activity barriers.  Participant demonstrates ability to understand and report RPE using BORG scale, to self-measure pulse accurately, and to acknowledge the importance of the exercise prescription.  Exercise Prescription Goal: Starting with aerobic activity 30 plus minutes a day, 3 days per week for initial exercise prescription. Provide home exercise prescription and guidelines that participant acknowledges understanding prior to discharge.  Activity Barriers & Risk Stratification:     Activity Barriers & Cardiac Risk Stratification - 01/15/16 1524      Activity Barriers & Cardiac Risk Stratification   Activity Barriers None   Cardiac Risk Stratification Moderate      6 Minute Walk:     6 Minute Walk    Row Name 01/14/16 1526         6 Minute Walk   Phase Initial     Distance 1633 feet     Walk Time 6 minutes     # of Rest Breaks 0     MPH 3.09     METS 3.88     RPE 10     VO2 Peak 13.58     Symptoms No     Resting HR 85 bpm     Resting BP 118/72     Max Ex. HR 101 bpm     Max Ex. BP 122/60     2 Minute Post BP 114/92        Initial Exercise Prescription:     Initial Exercise Prescription - 01/15/16 1500      Date of Initial Exercise RX and Referring Provider   Date 01/14/16   Referring Provider [name] MD     Treadmill   MPH 2.8   Grade 2   Minutes 10   METs 3.92     Bike   Level 1   Minutes 10   METs 3.9     NuStep   Level 3   Minutes 10   METs 3     Prescription Details   Frequency (times per week) 3   Duration Progress to 30 minutes of continuous aerobic without signs/symptoms of physical distress     Intensity   THRR 40-80% of Max Heartrate 61-122   Ratings of Perceived Exertion 11-13   Perceived Dyspnea 0-4     Progression   Progression Continue to progress workloads to maintain intensity without signs/symptoms of physical distress.     Resistance Training   Training Prescription Yes   Weight 2 lbs   Reps 10-12      Perform Capillary  Blood Glucose checks as needed.  Exercise Prescription Changes:     Exercise Prescription Changes    Row Name  02/05/16 0900 02/28/16 1600 03/30/16 1400         Exercise Review   Progression Yes Yes Yes       Response to Exercise   Blood Pressure (Admit) 111/78 100/72 122/92     Blood Pressure (Exercise) 130/70 118/70 148/80     Blood Pressure (Exit) 103/70 106/72 110/76     Heart Rate (Admit) 99 bpm 98 bpm 92 bpm     Heart Rate (Exercise) 126 bpm 125 bpm 113 bpm     Heart Rate (Exit) 100 bpm 102 bpm 91 bpm     Rating of Perceived Exertion (Exercise) '12 11 12     ' Symptoms  - none none     Comments Reviewed home exercise guidelines on 01/27/16 Reviewed home exercise guidelines on 01/27/16 Reviewed home exercise guidelines on 01/27/16     Duration Progress to 30 minutes of continuous aerobic without signs/symptoms of physical distress Progress to 30 minutes of continuous aerobic without signs/symptoms of physical distress Progress to 30 minutes of continuous aerobic without signs/symptoms of physical distress     Intensity THRR unchanged THRR unchanged THRR unchanged       Progression   Progression Continue to progress workloads to maintain intensity without signs/symptoms of physical distress. Continue to progress workloads to maintain intensity without signs/symptoms of physical distress. Continue to progress workloads to maintain intensity without signs/symptoms of physical distress.     Average METs 3.9 4.6 4.7       Resistance Training   Training Prescription  - Yes Yes     Weight 4 lbs 4 lbs 5 lbs     Reps  - 10-12 10-12       Interval Training   Interval Training No No No       Treadmill   MPH 3 3.5 3.5     Grade '2 3 3     ' Minutes '10 10 10     ' METs 4.12 5.13 5.13       Bike   Level 1.2 1.5 -     Minutes 10 10 -     METs 4.5 5.36 -       Recumbant Bike   Level  -  - 3     Watts  -  - 31     Minutes  -  - 10     METs  -  - 3.45       NuStep   Level '4 5 5      ' Minutes '10 10 10     ' METs 3.1 3.3 3.4       Home Exercise Plan   Plans to continue exercise at [place]     Frequency Add 4 additional days to program exercise sessions. Add 4 additional days to program exercise sessions. Add 4 additional days to program exercise sessions.        Exercise Comments:     Exercise Comments    Row Name 01/27/16 1210 02/03/16 1646 02/28/16 1629 03/30/16 1352     Exercise Comments Reviewed home exercise guidelines with patient. Off to a good start with exercise. Patient doing well with exercise. Walking daily 45-60 minutes. Making excellent progress with exercise. Doing well with exercise, walking at home and increasing workloads at cardiac rehab appropriately.       Discharge Exercise Prescription (Final Exercise Prescription Changes):     Exercise Prescription Changes - 03/30/16 1400      Exercise Review   Progression Yes  Response to Exercise   Blood Pressure (Admit) 122/92   Blood Pressure (Exercise) 148/80   Blood Pressure (Exit) 110/76   Heart Rate (Admit) 92 bpm   Heart Rate (Exercise) 113 bpm   Heart Rate (Exit) 91 bpm   Rating of Perceived Exertion (Exercise) 12   Symptoms none   Comments Reviewed home exercise guidelines on 01/27/16   Duration Progress to 30 minutes of continuous aerobic without signs/symptoms of physical distress   Intensity THRR unchanged     Progression   Progression Continue to progress workloads to maintain intensity without signs/symptoms of physical distress.   Average METs 4.7     Resistance Training   Training Prescription Yes   Weight 5 lbs   Reps 10-12     Interval Training   Interval Training No     Treadmill   MPH 3.5   Grade 3   Minutes 10   METs 5.13     Bike   Level --   Minutes --   METs --     Recumbant Bike   Level 3   Watts 31   Minutes 10   METs 3.45     NuStep   Level 5   Minutes 10   METs 3.4     Home Exercise Plan   Plans to continue exercise at Home    Frequency Add 4 additional days to program exercise sessions.      Nutrition:  Target Goals: Understanding of nutrition guidelines, daily intake of sodium <1518m, cholesterol <2016m calories 30% from fat and 7% or less from saturated fats, daily to have 5 or more servings of fruits and vegetables.  Biometrics:     Pre Biometrics - 01/15/16 1521      Pre Biometrics   Height '5\' 7"'  (1.702 m)   Weight 143 lb 1.3 oz (64.9 kg)   Waist Circumference 30.25 inches   Hip Circumference 38.25 inches   Waist to Hip Ratio 0.79 %   BMI (Calculated) 22.5   Triceps Skinfold 25 mm   % Body Fat 23.2 %   Grip Strength 26.5 kg   Flexibility 16.5 in   Single Leg Stand 30 seconds       Nutrition Therapy Plan and Nutrition Goals:   Nutrition Discharge: Nutrition Scores:     Nutrition Assessments - 01/16/16 1512      MEDFICTS Scores   Pre Score 18  will verify score with pt      Nutrition Goals Re-Evaluation:   Psychosocial: Target Goals: Acknowledge presence or absence of depression, maximize coping skills, provide positive support system. Participant is able to verbalize types and ability to use techniques and skills needed for reducing stress and depression.  Initial Review & Psychosocial Screening:     Initial Psych Review & Screening - 01/14/16 [place]     Barriers   Psychosocial barriers to participate in program The patient should benefit from training in stress management and relaxation.     Screening Interventions   Interventions Encouraged to exercise      Quality of Life Scores:     Quality of Life - 01/15/16 1525      Quality of Life Scores   Health/Function Pre 20.48 %   Socioeconomic Pre 20 %   Psych/Spiritual Pre 24 %   Family Pre 28.8 %   GLOBAL Pre 22.52 %      PHQ-9: Recent Review Flowsheet Data  Depression screen [facility] 2/9 01/22/2016 01/04/2015 10/19/2013   Decreased Interest 0 0 0   Down,  Depressed, Hopeless 0 0 0   PHQ - 2 Score 0 0 0      Psychosocial Evaluation and Intervention:     Psychosocial Evaluation - 04/02/16 1701      Psychosocial Evaluation & Interventions   Interventions Encouraged to exercise with the program and follow exercise prescription   Comments Psychosocial Assessment reveals no identifiable issues, no further intervention warranted      Psychosocial Re-Evaluation:     Psychosocial Re-Evaluation    [place] Name 02/10/16 1652 03/05/16 1857           Psychosocial Re-Evaluation   Interventions Encouraged to attend Cardiac Rehabilitation for the exercise Encouraged to attend Cardiac Rehabilitation for the exercise      Comments No needs identified, no further intervention warranted No needs identified, no further intervention warranted         Vocational Rehabilitation: Provide vocational rehab assistance to qualifying candidates.   Vocational Rehab Evaluation & Intervention:     Vocational Rehab - 01/14/16 1608      Initial Vocational Rehab Evaluation & Intervention   Assessment shows need for Vocational Rehabilitation No      Education: Education Goals: Education classes will be provided on a weekly basis, covering required topics. Participant will state understanding/return demonstration of topics presented.  Learning Barriers/Preferences:     Learning Barriers/Preferences - 01/16/16 1131      Learning Barriers/Preferences   Learning Barriers Sight      Education Topics: Count Your Pulse:  -Group instruction provided by verbal instruction, demonstration, patient participation and written materials to support subject.  Instructors address importance of being able to find your pulse and how to count your pulse when at home without a heart monitor.  Patients get hands on experience counting their pulse with staff help and individually. Flowsheet Row CARDIAC REHAB PHASE II EXERCISE from 03/20/2016 in [place]  Date  02/21/16  Educator  Andi Hence, RN  Instruction Review Code  2- meets goals/outcomes      Heart Attack, Angina, and Risk Factor Modification:  -Group instruction provided by verbal instruction, video, and written materials to support subject.  Instructors address signs and symptoms of angina and heart attacks.    Also discuss risk factors for heart disease and how to make changes to improve heart health risk factors.   Functional Fitness:  -Group instruction provided by verbal instruction, demonstration, patient participation, and written materials to support subject.  Instructors address safety measures for doing things around the house.  Discuss how to get up and down off the floor, how to pick things up properly, how to safely get out of a chair without assistance, and balance training.   Meditation and Mindfulness:  -Group instruction provided by verbal instruction, patient participation, and written materials to support subject.  Instructor addresses importance of mindfulness and meditation practice to help reduce stress and improve awareness.  Instructor also leads participants through a meditation exercise.    Stretching for Flexibility and Mobility:  -Group instruction provided by verbal instruction, patient participation, and written materials to support subject.  Instructors lead participants through series of stretches that are designed to increase flexibility thus improving mobility.  These stretches are additional exercise for major muscle groups that are typically performed during regular warm up and cool down.   Hands Only CPR Anytime:  -Group instruction provided by verbal instruction,  video, patient participation and written materials to support subject.  Instructors co-teach with [facility]HA video for hands only CPR.  Participants get hands on experience with mannequins.   Nutrition I class: Heart Healthy Eating:  -Group instruction provided by PowerPoint  slides, verbal discussion, and written materials to support subject matter. The instructor gives an explanation and review of the Therapeutic Lifestyle Changes diet recommendations, which includes a discussion on lipid goals, dietary fat, sodium, fiber, plant stanol/sterol esters, sugar, and the components of a well-balanced, healthy diet. Flowsheet Row CARDIAC REHAB PHASE II EXERCISE from 03/20/2016 in [place]  Date  03/09/16  Educator  RD  Instruction Review Code  Not applicable [class handouts given]      Nutrition II class: Lifestyle Skills:  -Group instruction provided by PowerPoint slides, verbal discussion, and written materials to support subject matter. The instructor gives an explanation and review of label reading, grocery shopping for heart health, heart healthy recipe modifications, and ways to make healthier choices when eating out. Flowsheet Row CARDIAC REHAB PHASE II EXERCISE from 03/20/2016 in [place]  Date  03/09/16  Educator  RD  Instruction Review Code  Not applicable [class handouts given]      Diabetes Question & Answer:  -Group instruction provided by PowerPoint slides, verbal discussion, and written materials to support subject matter. The instructor gives an explanation and review of diabetes co-morbidities, pre- and post-prandial blood glucose goals, pre-exercise blood glucose goals, signs, symptoms, and treatment of hypoglycemia and hyperglycemia, and foot care basics.   Diabetes Blitz:  -Group instruction provided by PowerPoint slides, verbal discussion, and written materials to support subject matter. The instructor gives an explanation and review of the physiology behind type 1 and type 2 diabetes, diabetes medications and rational behind using different medications, pre- and post-prandial blood glucose recommendations and Hemoglobin A1c goals, diabetes diet, and exercise including blood glucose  guidelines for exercising safely.    Portion Distortion:  -Group instruction provided by PowerPoint slides, verbal discussion, written materials, and food models to support subject matter. The instructor gives an explanation of serving size versus portion size, changes in portions sizes over the last 20 years, and what consists of a serving from each food group.   Stress Management:  -Group instruction provided by verbal instruction, video, and written materials to support subject matter.  Instructors review role of stress in heart disease and how to cope with stress positively.   Flowsheet Row CARDIAC REHAB PHASE II EXERCISE from 03/20/2016 in [place]  Date  01/22/16  Instruction Review Code  2- meets goals/outcomes      Exercising on Your Own:  -Group instruction provided by verbal instruction, power point, and written materials to support subject.  Instructors discuss benefits of exercise, components of exercise, frequency and intensity of exercise, and end points for exercise.  Also discuss use of nitroglycerin and activating EMS.  Review options of places to exercise outside of rehab.  Review guidelines for sex with heart disease.   Cardiac Drugs I:  -Group instruction provided by verbal instruction and written materials to support subject.  Instructor reviews cardiac drug classes: antiplatelets, anticoagulants, beta blockers, and statins.  Instructor discusses reasons, side effects, and lifestyle considerations for each drug class.   Cardiac Drugs II:  -Group instruction provided by verbal instruction and written materials to support subject.  Instructor reviews cardiac drug classes: angiotensin converting enzyme inhibitors (ACE-I), angiotensin II receptor blockers (ARBs),  nitrates, and calcium channel blockers.  Instructor discusses reasons, side effects, and lifestyle considerations for each drug class.   Anatomy and Physiology of the Circulatory  System:  -Group instruction provided by verbal instruction, video, and written materials to support subject.  Reviews functional anatomy of heart, how it relates to various diagnoses, and what role the heart plays in the overall system.   Knowledge Questionnaire Score:   Core Components/Risk Factors/Patient Goals at Admission:     Personal Goals and Risk Factors at Admission - 01/14/16 1600      Core Components/Risk Factors/Patient Goals on Admission   Personal Goal Other Yes   Personal Goal get back to previous activities ie walking and tennis    Intervention monitored cardiac rehab exercise, home exercise education   Expected Outcomes achievement of increased cardiorespiratory fitness and enhanced flexability, muscular endurance and strength shown through measurements of functional capacity and personal statement of participation.       Core Components/Risk Factors/Patient Goals Review:      Goals and Risk Factor Review    Row Name 02/03/16 1647 03/04/16 1450 03/30/16 1358         Core Components/Risk Factors/Patient Goals Review   Personal Goals Review Other Other Other     Review Patient doing well with exercise, walking daily in addition to cardiac rehab. Energy level is about the same.  Patient doing well with exercise program. Walking at home and back to playing tennis. Continues to progress well with exercise. Personal goals have been met. Pt is hitting tennis balls and planning to return to tennis.     Expected Outcomes Progress work loads to improve strength and stamina. Achieve improved cardiorespiratory fitness through regular exercise routine. Maintain exercise routine to achieve personal health and fitness goals.        Core Components/Risk Factors/Patient Goals at Discharge (Final Review):      Goals and Risk Factor Review - 03/30/16 1358      Core Components/Risk Factors/Patient Goals Review   Personal Goals Review Other   Review Continues to progress well  with exercise. Personal goals have been met. Pt is hitting tennis balls and planning to return to tennis.   Expected Outcomes Maintain exercise routine to achieve personal health and fitness goals.      ITP Comments:     ITP Comments    Row Name 01/14/16 1330 01/24/16 1113         ITP Comments Medical Director- Dr. Fransico Him, MD attended Hypertension education video/lecture class, met outcomes/goals         Comments:  Pt is making expected progress toward personal goals after completing 27 sessions. Pt is doing well with the recent passing of her mother.  Continue to support pt with rehab.  No new psychosocial issues identified, no further intervention needed. Recommend continued exercise and life style modification education including  stress management and relaxation techniques to decrease cardiac risk profile.  Cherre Huger, BSN

## 2016-04-03 ENCOUNTER — Encounter (HOSPITAL_COMMUNITY)
Admission: RE | Admit: 2016-04-03 | Discharge: 2016-04-03 | Disposition: A | Discharge status: Home / Self Care | Payer: Medicare Other | Source: Ambulatory Visit | Attending: Cardiology | Admitting: Cardiology

## 2016-04-03 ENCOUNTER — Encounter (HOSPITAL_COMMUNITY): Payer: Medicare Other

## 2016-04-03 DIAGNOSIS — Z9889 Other specified postprocedural states: Secondary | ICD-10-CM

## 2016-04-03 DIAGNOSIS — Z8679 Personal history of other diseases of the circulatory system: Secondary | ICD-10-CM

## 2016-04-03 DIAGNOSIS — Z7901 Long term (current) use of anticoagulants: Secondary | ICD-10-CM | POA: Diagnosis not present

## 2016-04-03 DIAGNOSIS — I4891 Unspecified atrial fibrillation: Secondary | ICD-10-CM | POA: Diagnosis not present

## 2016-04-06 ENCOUNTER — Encounter (HOSPITAL_COMMUNITY): Payer: Medicare Other

## 2016-04-06 ENCOUNTER — Encounter (HOSPITAL_COMMUNITY)
Admission: RE | Admit: 2016-04-06 | Discharge: 2016-04-06 | Disposition: A | Discharge status: Home / Self Care | Payer: Medicare Other | Source: Ambulatory Visit | Attending: Cardiology | Admitting: Cardiology

## 2016-04-06 DIAGNOSIS — I4891 Unspecified atrial fibrillation: Secondary | ICD-10-CM | POA: Diagnosis not present

## 2016-04-06 DIAGNOSIS — Z8679 Personal history of other diseases of the circulatory system: Secondary | ICD-10-CM | POA: Diagnosis not present

## 2016-04-06 DIAGNOSIS — Z9889 Other specified postprocedural states: Secondary | ICD-10-CM

## 2016-04-06 DIAGNOSIS — Z7901 Long term (current) use of anticoagulants: Secondary | ICD-10-CM | POA: Diagnosis not present

## 2016-04-07 DIAGNOSIS — L570 Actinic keratosis: Secondary | ICD-10-CM | POA: Diagnosis not present

## 2016-04-07 DIAGNOSIS — L578 Other skin changes due to chronic exposure to nonionizing radiation: Secondary | ICD-10-CM | POA: Diagnosis not present

## 2016-04-08 ENCOUNTER — Encounter (HOSPITAL_COMMUNITY): Payer: Medicare Other

## 2016-04-08 ENCOUNTER — Encounter (HOSPITAL_COMMUNITY)
Admission: RE | Admit: 2016-04-08 | Discharge: 2016-04-08 | Disposition: A | Discharge status: Home / Self Care | Payer: Medicare Other | Source: Ambulatory Visit | Attending: Cardiology | Admitting: Cardiology

## 2016-04-08 DIAGNOSIS — Z9889 Other specified postprocedural states: Secondary | ICD-10-CM | POA: Diagnosis not present

## 2016-04-08 DIAGNOSIS — I4891 Unspecified atrial fibrillation: Secondary | ICD-10-CM | POA: Diagnosis not present

## 2016-04-08 DIAGNOSIS — Z7901 Long term (current) use of anticoagulants: Secondary | ICD-10-CM | POA: Diagnosis not present

## 2016-04-08 DIAGNOSIS — Z8679 Personal history of other diseases of the circulatory system: Secondary | ICD-10-CM

## 2016-04-09 ENCOUNTER — Telehealth: Payer: Self-pay | Admitting: Cardiology

## 2016-04-09 NOTE — Telephone Encounter (Signed)
Returned call to pt, she lost her most recent Coumadin sheet with dosing. Gave her verbal instructions with her Coumadin dose and verified the time of her next appt.

## 2016-04-09 NOTE — Telephone Encounter (Signed)
New message   Pt verbalized that she wants a print out of all her medications  She said she can come pick the list up today

## 2016-04-10 ENCOUNTER — Encounter (HOSPITAL_COMMUNITY)
Admission: RE | Admit: 2016-04-10 | Discharge: 2016-04-10 | Disposition: A | Discharge status: Home / Self Care | Payer: Medicare Other | Source: Ambulatory Visit | Attending: Cardiology | Admitting: Cardiology

## 2016-04-10 ENCOUNTER — Encounter (HOSPITAL_COMMUNITY): Payer: Medicare Other

## 2016-04-10 DIAGNOSIS — Z9889 Other specified postprocedural states: Secondary | ICD-10-CM

## 2016-04-10 DIAGNOSIS — Z8679 Personal history of other diseases of the circulatory system: Secondary | ICD-10-CM

## 2016-04-13 ENCOUNTER — Encounter (HOSPITAL_COMMUNITY)
Admission: RE | Admit: 2016-04-13 | Discharge: 2016-04-13 | Disposition: A | Discharge status: Home / Self Care | Payer: Medicare Other | Source: Ambulatory Visit | Attending: Cardiology | Admitting: Cardiology

## 2016-04-13 ENCOUNTER — Encounter (HOSPITAL_COMMUNITY): Payer: Medicare Other

## 2016-04-13 VITALS — Ht 67.0 in | Wt 141.8 lb

## 2016-04-13 DIAGNOSIS — I4891 Unspecified atrial fibrillation: Secondary | ICD-10-CM | POA: Diagnosis not present

## 2016-04-13 DIAGNOSIS — Z9889 Other specified postprocedural states: Secondary | ICD-10-CM | POA: Diagnosis not present

## 2016-04-13 DIAGNOSIS — Z8679 Personal history of other diseases of the circulatory system: Secondary | ICD-10-CM | POA: Diagnosis not present

## 2016-04-13 DIAGNOSIS — Z7901 Long term (current) use of anticoagulants: Secondary | ICD-10-CM | POA: Diagnosis not present

## 2016-04-15 ENCOUNTER — Encounter (HOSPITAL_COMMUNITY)
Admission: RE | Admit: 2016-04-15 | Discharge: 2016-04-15 | Disposition: A | Discharge status: Home / Self Care | Payer: Medicare Other | Source: Ambulatory Visit | Attending: Cardiology | Admitting: Cardiology

## 2016-04-15 ENCOUNTER — Encounter (HOSPITAL_COMMUNITY): Payer: Medicare Other

## 2016-04-15 DIAGNOSIS — Z7901 Long term (current) use of anticoagulants: Secondary | ICD-10-CM | POA: Diagnosis not present

## 2016-04-15 DIAGNOSIS — I4891 Unspecified atrial fibrillation: Secondary | ICD-10-CM | POA: Diagnosis not present

## 2016-04-15 DIAGNOSIS — Z9889 Other specified postprocedural states: Secondary | ICD-10-CM

## 2016-04-15 DIAGNOSIS — Z8679 Personal history of other diseases of the circulatory system: Secondary | ICD-10-CM | POA: Diagnosis not present

## 2016-04-16 DIAGNOSIS — M7062 Trochanteric bursitis, left hip: Secondary | ICD-10-CM | POA: Diagnosis not present

## 2016-04-17 ENCOUNTER — Encounter (INDEPENDENT_AMBULATORY_CARE_PROVIDER_SITE_OTHER): Payer: Self-pay

## 2016-04-17 ENCOUNTER — Encounter (HOSPITAL_COMMUNITY)
Admission: RE | Admit: 2016-04-17 | Discharge: 2016-04-17 | Disposition: A | Discharge status: Home / Self Care | Payer: Medicare Other | Source: Ambulatory Visit | Attending: Cardiology | Admitting: Cardiology

## 2016-04-17 ENCOUNTER — Ambulatory Visit (INDEPENDENT_AMBULATORY_CARE_PROVIDER_SITE_OTHER): Payer: Medicare Other | Admitting: Pharmacist

## 2016-04-17 ENCOUNTER — Encounter (HOSPITAL_COMMUNITY): Payer: Medicare Other

## 2016-04-17 DIAGNOSIS — I4891 Unspecified atrial fibrillation: Secondary | ICD-10-CM | POA: Diagnosis not present

## 2016-04-17 DIAGNOSIS — Z9889 Other specified postprocedural states: Secondary | ICD-10-CM

## 2016-04-17 DIAGNOSIS — Z8679 Personal history of other diseases of the circulatory system: Secondary | ICD-10-CM | POA: Diagnosis not present

## 2016-04-17 DIAGNOSIS — Z7901 Long term (current) use of anticoagulants: Secondary | ICD-10-CM | POA: Diagnosis not present

## 2016-04-17 DIAGNOSIS — Z5181 Encounter for therapeutic drug level monitoring: Secondary | ICD-10-CM

## 2016-04-17 DIAGNOSIS — I480 Paroxysmal atrial fibrillation: Secondary | ICD-10-CM

## 2016-04-17 LAB — POCT INR: INR: 2.4

## 2016-04-20 ENCOUNTER — Encounter (HOSPITAL_COMMUNITY)
Admission: RE | Admit: 2016-04-20 | Discharge: 2016-04-20 | Disposition: A | Discharge status: Home / Self Care | Payer: Medicare Other | Source: Ambulatory Visit | Attending: Cardiology | Admitting: Cardiology

## 2016-04-20 ENCOUNTER — Encounter (HOSPITAL_COMMUNITY): Payer: Medicare Other

## 2016-04-20 DIAGNOSIS — Z9889 Other specified postprocedural states: Secondary | ICD-10-CM

## 2016-04-20 DIAGNOSIS — Z8679 Personal history of other diseases of the circulatory system: Secondary | ICD-10-CM | POA: Diagnosis not present

## 2016-04-20 DIAGNOSIS — I4891 Unspecified atrial fibrillation: Secondary | ICD-10-CM | POA: Diagnosis not present

## 2016-04-20 DIAGNOSIS — Z7901 Long term (current) use of anticoagulants: Secondary | ICD-10-CM | POA: Diagnosis not present

## 2016-04-22 ENCOUNTER — Encounter (HOSPITAL_COMMUNITY): Payer: Medicare Other

## 2016-04-22 ENCOUNTER — Encounter (HOSPITAL_COMMUNITY)
Admission: RE | Admit: 2016-04-22 | Discharge: 2016-04-22 | Disposition: A | Discharge status: Home / Self Care | Payer: Medicare Other | Source: Ambulatory Visit | Attending: Cardiology | Admitting: Cardiology

## 2016-04-22 VITALS — BP 102/86 | HR 99 | Ht 67.0 in | Wt 140.7 lb

## 2016-04-22 DIAGNOSIS — Z8679 Personal history of other diseases of the circulatory system: Secondary | ICD-10-CM | POA: Diagnosis not present

## 2016-04-22 DIAGNOSIS — I4891 Unspecified atrial fibrillation: Secondary | ICD-10-CM | POA: Insufficient documentation

## 2016-04-22 DIAGNOSIS — Z7901 Long term (current) use of anticoagulants: Secondary | ICD-10-CM | POA: Diagnosis not present

## 2016-04-22 DIAGNOSIS — Z9889 Other specified postprocedural states: Secondary | ICD-10-CM | POA: Insufficient documentation

## 2016-04-22 DIAGNOSIS — J189 Pneumonia, unspecified organism: Secondary | ICD-10-CM

## 2016-04-22 HISTORY — DX: Pneumonia, unspecified organism: J18.9

## 2016-04-22 NOTE — Progress Notes (Signed)
Discharge Summary  Patient Details  Name: [name] MRN: [id number] Date of Birth: 05-19-1946 Referring Provider:   Flowsheet Row CARDIAC REHAB PHASE II ORIENTATION from 01/14/2016 in [facility]  Referring Provider  [name] MD       Number of Visits: 36 Reason for Discharge:  Patient reached a stable level of exercise. Patient independent in their exercise.  Smoking History:  History  Smoking Status  . Former Smoker  . Packs/day: 1.00  . Years: 20.00  . Types: Cigarettes  . Quit date: 08/11/1970  Smokeless Tobacco  . Never Used    Diagnosis:  S/P Maze operation for atrial fibrillation  S/P mitral valve repair  ADL UCSD:   Initial Exercise Prescription:     Initial Exercise Prescription - 01/15/16 1500      Date of Initial Exercise RX and Referring Provider   Date 01/14/16   Referring Provider [name] MD     Treadmill   MPH 2.8   Grade 2   Minutes 10   METs 3.92     Bike   Level 1   Minutes 10   METs 3.9     NuStep   Level 3   Minutes 10   METs 3     Prescription Details   Frequency (times per week) 3   Duration Progress to 30 minutes of continuous aerobic without signs/symptoms of physical distress     Intensity   THRR 40-80% of Max Heartrate 61-122   Ratings of Perceived Exertion 11-13   Perceived Dyspnea 0-4     Progression   Progression Continue to progress workloads to maintain intensity without signs/symptoms of physical distress.     Resistance Training   Training Prescription Yes   Weight 2 lbs   Reps 10-12      Discharge Exercise Prescription (Final Exercise Prescription Changes):     Exercise Prescription Changes - 04/27/16 1200      Exercise Review   Progression Yes     Response to Exercise   Blood Pressure (Admit) 102/80   Blood Pressure (Exercise) 118/80   Blood Pressure (Exit) 102/68   Heart Rate (Admit) 99 bpm   Heart Rate (Exercise) 120 bpm   Heart Rate (Exit) 98  bpm   Rating of Perceived Exertion (Exercise) 9   Symptoms none   Comments Reviewed home exercise guidelines on 01/27/16   Duration Progress to 30 minutes of continuous aerobic without signs/symptoms of physical distress   Intensity THRR unchanged     Progression   Progression Continue to progress workloads to maintain intensity without signs/symptoms of physical distress.   Average METs 4.4     Resistance Training   Training Prescription Yes   Weight 5 lbs   Reps 10-12     Interval Training   Interval Training No     Treadmill   MPH 3.5   Grade 3   Minutes 10   METs 5.13     Recumbant Bike   Level 4   Watts 39   Minutes 10   METs 4.02     NuStep   Level 5   Minutes 10   METs 4     Home Exercise Plan   Plans to continue exercise at Home   Frequency Add 4 additional days to program exercise sessions.      Functional Capacity:     6 Minute Walk    Row Name 01/14/16 1526 04/13/16 1425  6 Minute Walk   Phase Initial Discharge    Distance 1633 feet 1852 feet    Distance % Change  - 13.41 %    Walk Time 6 minutes 6 minutes    # of Rest Breaks 0 0    MPH 3.09 3.51    METS 3.88 4.38    RPE 10 9    Perceived Dyspnea   - 0    VO2 Peak 13.58 15.34    Symptoms No No    Resting HR 85 bpm 97 bpm    Resting BP 118/72 120/86    Max Ex. HR 101 bpm 119 bpm    Max Ex. BP 122/60 122/77    2 Minute Post BP 114/92 115/81       Psychological, QOL, Others - Outcomes: PHQ 2/9: Depression screen [facility] 2/9 04/22/2016 01/22/2016 01/04/2015 10/19/2013  Decreased Interest 0 0 0 0  Down, Depressed, Hopeless 0 0 0 0  PHQ - 2 Score 0 0 0 0  Some recent data might be hidden    Quality of Life:     Quality of Life - 04/22/16 1127      Quality of Life Scores   Health/Function Pre 20.48 %   Health/Function Post 27.85 %   Health/Function % Change 35.99 %   Socioeconomic Pre 20 %   Socioeconomic Post 28.07 %   Socioeconomic % Change  40.35 %   Psych/Spiritual Pre 24 %    Psych/Spiritual Post 30 %   Psych/Spiritual % Change 25 %   Family Pre 28.8 %   Family Post 28.8 %   Family % Change 0 %   GLOBAL Pre 22.52 %   GLOBAL Post 28.52 %   GLOBAL % Change 26.64 %      Personal Goals: Goals established at orientation with interventions provided to work toward goal.     Personal Goals and Risk Factors at Admission - 01/14/16 1600      Core Components/Risk Factors/Patient Goals on Admission   Personal Goal Other Yes   Personal Goal get back to previous activities ie walking and tennis    Intervention monitored cardiac rehab exercise, home exercise education   Expected Outcomes achievement of increased cardiorespiratory fitness and enhanced flexability, muscular endurance and strength shown through measurements of functional capacity and personal statement of participation.        Personal Goals Discharge:     Goals and Risk Factor Review    Row Name 02/03/16 1647 03/04/16 1450 03/30/16 1358 04/22/16 1150       Core Components/Risk Factors/Patient Goals Review   Personal Goals Review Other Other Other Other    Review Patient doing well with exercise, walking daily in addition to cardiac rehab. Energy level is about the same.  Patient doing well with exercise program. Walking at home and back to playing tennis. Continues to progress well with exercise. Personal goals have been met. Pt is hitting tennis balls and planning to return to tennis. Participant has maintained her weight and has resumed previous activities that she wanted to return to such as walking and playing tennis.    Expected Outcomes Progress work loads to improve strength and stamina. Achieve improved cardiorespiratory fitness through regular exercise routine. Maintain exercise routine to achieve personal health and fitness goals. Participant will continue exercise program 50 minutes daily at the gym to maintain heart health and personal fitness and weight management goals.        Nutrition & Weight - Outcomes:  Pre Biometrics - 01/15/16 1521      Pre Biometrics   Height _0  (1.702 m)   Weight 143 lb 1.3 oz (64.9 kg)   Waist Circumference 30.25 inches   Hip Circumference 38.25 inches   Waist to Hip Ratio 0.79 %   BMI (Calculated) 22.5   Triceps Skinfold 25 mm   % Body Fat 23.2 %   Grip Strength 26.5 kg   Flexibility 16.5 in   Single Leg Stand 30 seconds         Post Biometrics - 04/22/16 1210       Post  Biometrics   Height _1  (1.702 m)   Weight 140 lb 10.5 oz (63.8 kg)   Waist Circumference 32.5 inches   Hip Circumference 39.5 inches   Waist to Hip Ratio 0.82 %   BMI (Calculated) 22.1   Triceps Skinfold 23 mm   % Body Fat 33.8 %   Grip Strength 32 kg   Flexibility 17.5 in   Single Leg Stand 30 seconds      Nutrition:   Nutrition Discharge:     Nutrition Assessments - 05/01/16 1346      MEDFICTS Scores   Pre Score 18   Post Score 12   Score Difference -6      Education Questionnaire Score:     Knowledge Questionnaire Score - 04/22/16 1201      Knowledge Questionnaire Score   Pre Score 20/24   Post Score 20/24      Goals reviewed with patient. Pt graduated from cardiac rehab program today with completion of 36 exercise sessions in Phase II. Pt maintained good attendance and progressed nicely during his participation in rehab as evidenced by increased MET level.   Medication list reconciled. Repeat  PHQ score-0  .  Pt has made significant lifestyle changes and should be commended for her success. Pt feels he has achieved her goals during cardiac rehab.   Pt plans to continue exercise at the gym Monday through Friday, plays tennis on saturdays and walks in the neighborhood on Sundays. Cherre Huger, BSN

## 2016-04-24 ENCOUNTER — Encounter (HOSPITAL_COMMUNITY): Payer: Medicare Other

## 2016-04-27 ENCOUNTER — Encounter (HOSPITAL_COMMUNITY): Payer: Self-pay | Admitting: Nurse Practitioner

## 2016-04-27 ENCOUNTER — Encounter (HOSPITAL_COMMUNITY): Payer: Medicare Other

## 2016-04-27 ENCOUNTER — Ambulatory Visit (HOSPITAL_COMMUNITY)
Admission: RE | Admit: 2016-04-27 | Discharge: 2016-04-27 | Disposition: A | Discharge status: Home / Self Care | Payer: Medicare Other | Source: Ambulatory Visit | Attending: Nurse Practitioner | Admitting: Nurse Practitioner

## 2016-04-27 VITALS — BP 116/74 | HR 91 | Ht 67.0 in | Wt 140.0 lb

## 2016-04-27 DIAGNOSIS — Z811 Family history of alcohol abuse and dependence: Secondary | ICD-10-CM | POA: Insufficient documentation

## 2016-04-27 DIAGNOSIS — Z803 Family history of malignant neoplasm of breast: Secondary | ICD-10-CM | POA: Insufficient documentation

## 2016-04-27 DIAGNOSIS — I340 Nonrheumatic mitral (valve) insufficiency: Secondary | ICD-10-CM | POA: Diagnosis not present

## 2016-04-27 DIAGNOSIS — Z808 Family history of malignant neoplasm of other organs or systems: Secondary | ICD-10-CM | POA: Diagnosis not present

## 2016-04-27 DIAGNOSIS — Z9889 Other specified postprocedural states: Secondary | ICD-10-CM

## 2016-04-27 DIAGNOSIS — I341 Nonrheumatic mitral (valve) prolapse: Secondary | ICD-10-CM | POA: Insufficient documentation

## 2016-04-27 DIAGNOSIS — I480 Paroxysmal atrial fibrillation: Secondary | ICD-10-CM | POA: Insufficient documentation

## 2016-04-27 DIAGNOSIS — M8580 Other specified disorders of bone density and structure, unspecified site: Secondary | ICD-10-CM | POA: Insufficient documentation

## 2016-04-27 DIAGNOSIS — Z79899 Other long term (current) drug therapy: Secondary | ICD-10-CM | POA: Diagnosis not present

## 2016-04-27 DIAGNOSIS — Z8679 Personal history of other diseases of the circulatory system: Secondary | ICD-10-CM

## 2016-04-27 DIAGNOSIS — Z888 Allergy status to other drugs, medicaments and biological substances status: Secondary | ICD-10-CM | POA: Diagnosis not present

## 2016-04-27 DIAGNOSIS — Z8041 Family history of malignant neoplasm of ovary: Secondary | ICD-10-CM | POA: Insufficient documentation

## 2016-04-27 DIAGNOSIS — Z86718 Personal history of other venous thrombosis and embolism: Secondary | ICD-10-CM | POA: Diagnosis not present

## 2016-04-27 DIAGNOSIS — I739 Peripheral vascular disease, unspecified: Secondary | ICD-10-CM | POA: Diagnosis not present

## 2016-04-27 DIAGNOSIS — Z7901 Long term (current) use of anticoagulants: Secondary | ICD-10-CM | POA: Diagnosis not present

## 2016-04-27 DIAGNOSIS — Z87891 Personal history of nicotine dependence: Secondary | ICD-10-CM | POA: Diagnosis not present

## 2016-04-27 DIAGNOSIS — Z833 Family history of diabetes mellitus: Secondary | ICD-10-CM | POA: Insufficient documentation

## 2016-04-27 NOTE — Progress Notes (Signed)
Primary Care Physician: [name], MD Referring Physician: Dr. Braxton Feathers is a 70 y.o. female for evaluation in the afib clinic. She is status post minimally invasive mitral valve repair and Maze procedure on 11/19/2015 for severe symptomatic primary mitral regurgitation and recurrent paroxysmal atrial fibrillation. She is now about 5 months s/p surgery and reports no symptoms that would indicate return of afib. She has been off amiodarone for several months. She has finished cardiac rehab and continues to exercise on her own at a local gym and has returned to playing tennis.She feels that she is much improved from surgery but still notices some numbness and mild discomfort at the incision site and an occassional cramp in her rt hand. She continues on warfarin.  Today, she denies symptoms of palpitations, chest pain, shortness of breath, orthopnea, PND, lower extremity edema, dizziness, presyncope, syncope, or neurologic sequela. The patient is tolerating medications without difficulties and is otherwise without complaint today.   Past Medical History:  Diagnosis Date  . Heart murmur   . History of melanoma excision   . History of palpitations   . INSOMNIA, CHRONIC 03/29/2009  . Mitral regurgitation    severe by TEE  . MVP (mitral valve prolapse)   . Osteopenia 01/2014   T score -1.8 FRAX 8.8%/1.3%  . Paroxysmal atrial fibrillation ([place]) 10/18/2014  . Peripheral vascular disease ([place]) 10/16   RT LEG dvt  . Pneumonia 11/17   hx  . PONV (postoperative nausea and vomiting)   . Prepatellar bursitis    RT KNEE  . S/P minimally invasive maze operation for atrial fibrillation 11/19/2015   Complete bilateral atrial lesion set using cryothermy and bipolar radiofrequency ablation with clipping of LA appendage via right mini thoracotomy approch  . S/P minimally invasive mitral valve repair 11/19/2015   Complex valvuloplasty including artificial Gore-tex neochord placement x 22,  closure of cleft in posterior leaflet, and 34 mm Edwards Physio II ring annuloplasty via right mini thoracotomy approach  . Skin cancer of arm    Squamous   Past Surgical History:  Procedure Laterality Date  . ABDOMINAL HYSTERECTOMY  1980   WITH BSO/FIBROIDS  . APPENDECTOMY  1969  . CARDIAC CATHETERIZATION N/A 10/21/2015   Procedure: Right/Left Heart Cath and Coronary Angiography;  Surgeon: Larey Dresser, MD;  Location: [place] [facility];  Service: Cardiovascular;  Laterality: N/A;  . CLIPPING OF ATRIAL APPENDAGE N/A 11/19/2015   Procedure: CLIPPING OF ATRIAL APPENDAGE;  Surgeon: Rexene Alberts, MD;  Location: [place];  Service: Open Heart Surgery;  Laterality: N/A;  . COLONOSCOPY    . DIAGNOSTIC LAPAROSCOPY     expl  . EXCISION MELANOMA WITH SENTINEL LYMPH NODE BIOPSY Right 10/02/2013   Procedure: WIDE EXCISION MELANOMA RIGHT ARM WITH SENTINEL LYMPH NODE BIOPSY;  Surgeon: Shann Medal, MD;  Location: [place];  Service: General;  Laterality: Right;  . KNEE BURSECTOMY Right 03/05/2014   Procedure: RIGHT KNEE BURSECTOMY;  Surgeon: Gearlean Alf, MD;  Location: [facility];  Service: Orthopedics;  Laterality: Right;  . MINIMALLY INVASIVE MAZE PROCEDURE N/A 11/19/2015   Procedure: MINIMALLY INVASIVE MAZE PROCEDURE;  Surgeon: Rexene Alberts, MD;  Location: [place];  Service: Open Heart Surgery;  Laterality: N/A;  . MITRAL VALVE REPAIR Right 11/19/2015   Procedure: MINIMALLY INVASIVE MITRAL VALVE REPAIR (MVR);  Surgeon: Rexene Alberts, MD;  Location: [facility];  Service: Open Heart Surgery;  Laterality: Right;  . OOPHORECTOMY  BSO WITH ABDOMINAL HYSTERECTOMY  . SHOULDER ARTHROSCOPY  2010   right  . TEE WITHOUT CARDIOVERSION N/A 10/21/2015   Procedure: TRANSESOPHAGEAL ECHOCARDIOGRAM (TEE);  Surgeon: Larey Dresser, MD;  Location: [place];  Service: Cardiovascular;  Laterality: N/A;  . TEE WITHOUT CARDIOVERSION N/A 11/19/2015   Procedure: TRANSESOPHAGEAL ECHOCARDIOGRAM (TEE);   Surgeon: Rexene Alberts, MD;  Location: [facility];  Service: Open Heart Surgery;  Laterality: N/A;  . THYROID CYST EXCISION  05-01-11   RIGHT  . TONSILLECTOMY  1980    Current Outpatient Prescriptions  Medication Sig Dispense Refill  . atorvastatin (LIPITOR) 20 MG tablet take 1 tablet by mouth daily  6  . desonide (DESOWEN) 0.05 % cream Apply 1 application topically daily as needed. For itching per patient    . ibuprofen (ADVIL,MOTRIN) 200 MG tablet Take 200 mg by mouth every 6 (six) hours as needed for moderate pain.    . metoprolol succinate (TOPROL-XL) 25 MG 24 hr tablet Take 1 tablet (25 mg total) by mouth daily. 30 tablet 3  . warfarin (COUMADIN) 4 MG tablet Take 2-4 mg by mouth daily. Take 4mg  every day except on sundays take 2mg  per patient    . zolpidem (AMBIEN) 10 MG tablet Take 1 tablet (10 mg total) by mouth at bedtime as needed. for sleep 30 tablet 2  . nystatin (MYCOSTATIN/NYSTOP) 100000 UNIT/GM POWD Apply 1 application topically daily as needed (skin).   2   No current facility-administered medications for this encounter.     Allergies  Allergen Reactions  . Inderal [Propranolol Hcl] Itching    SEVERE LIP(INSIDE ITCH) AND SKIN DRYNESS  . Latex Itching    Sensitive to latex  . Tape Other (See Comments)    Very sensitive skin-    Social History   Social History  . Marital status: Married    Spouse name: N/A  . Number of children: N/A  . Years of education: N/A   Occupational History  . retired Unemployed   Social History Main Topics  . Smoking status: Former Smoker    Packs/day: 1.00    Years: 20.00    Types: Cigarettes    Quit date: 08/11/1970  . Smokeless tobacco: Never Used  . Alcohol use 4.2 oz/week    7 Glasses of wine per week  . Drug use: No  . Sexual activity: Yes    Birth control/ protection: Surgical     Comment: intercourse age 70, sexual partners less than 5   Other Topics Concern  . Not on file   Social History Narrative  . No narrative  on file    Family History  Problem Relation Age of Onset  . Diabetes type II Mother   . Alcohol abuse Father   . Throat cancer Father   . Ovarian cancer Maternal Aunt   . Breast cancer Maternal Grandmother     ROS- All systems are reviewed and negative except as per the HPI above  Physical Exam: Vitals:   04/27/16 1332  BP: 116/74  Pulse: 91  Weight: 140 lb (63.5 kg)  Height: 5\' 7"  (1.702 m)    GEN- The patient is well appearing, alert and oriented x 3 today.   Head- normocephalic, atraumatic Eyes-  Sclera clear, conjunctiva pink Ears- hearing intact Oropharynx- clear Neck- supple, no JVP Lymph- no cervical lymphadenopathy Lungs- Clear to ausculation bilaterally, normal work of breathing Heart- Regular rate and rhythm, no murmurs, rubs or gallops, PMI not laterally displaced GI- soft, NT, ND, +  BS Extremities- no clubbing, cyanosis, or edema MS- no significant deformity or atrophy Skin- no rash or lesion Psych- euthymic mood, full affect Neuro- strength and sensation are intact  EKG-NSR at 91 bpm, LAD, pr int 164 ms, qrs int 86 ms, qtc 450 ms Epic records reviewed Dr. Guy Sandifer notes reviewed  Assessment and Plan: 1. S/p  minimally invasive mitral valve repair and Maze procedure on 11/19/2015 for severe symptomatic primary mitral regurgitation and recurrent paroxysmal atrial fibrillation.  Now off amiodarone and is maintaining SR Has returned to normal activity without symptoms Continue metoprolol Continue on warfarin     F/u with Dr. Roxy Manns as scheduled 12/11 and afib clinic in one year for long term surveillance of afib s/p maze procedure   Butch Penny C. Adra Shepler, [facility] [address] [place], [place] [address] [phone]

## 2016-04-29 ENCOUNTER — Encounter (HOSPITAL_COMMUNITY): Payer: Medicare Other

## 2016-05-01 ENCOUNTER — Encounter (HOSPITAL_COMMUNITY): Payer: Medicare Other

## 2016-05-01 ENCOUNTER — Encounter (HOSPITAL_COMMUNITY): Payer: Self-pay | Admitting: *Deleted

## 2016-05-04 ENCOUNTER — Encounter (HOSPITAL_COMMUNITY): Payer: Medicare Other

## 2016-05-06 ENCOUNTER — Encounter (HOSPITAL_COMMUNITY): Payer: Medicare Other

## 2016-05-08 ENCOUNTER — Encounter (HOSPITAL_COMMUNITY): Payer: Medicare Other

## 2016-05-08 ENCOUNTER — Ambulatory Visit (INDEPENDENT_AMBULATORY_CARE_PROVIDER_SITE_OTHER): Payer: Medicare Other | Admitting: *Deleted

## 2016-05-08 DIAGNOSIS — Z8679 Personal history of other diseases of the circulatory system: Secondary | ICD-10-CM | POA: Diagnosis not present

## 2016-05-08 DIAGNOSIS — Z9889 Other specified postprocedural states: Secondary | ICD-10-CM

## 2016-05-08 DIAGNOSIS — I480 Paroxysmal atrial fibrillation: Secondary | ICD-10-CM | POA: Diagnosis not present

## 2016-05-08 DIAGNOSIS — Z5181 Encounter for therapeutic drug level monitoring: Secondary | ICD-10-CM | POA: Diagnosis not present

## 2016-05-08 LAB — POCT INR: INR: 2.8

## 2016-05-11 ENCOUNTER — Encounter (HOSPITAL_COMMUNITY): Payer: Medicare Other

## 2016-05-13 ENCOUNTER — Encounter (HOSPITAL_COMMUNITY): Payer: Medicare Other

## 2016-05-13 DIAGNOSIS — L570 Actinic keratosis: Secondary | ICD-10-CM | POA: Diagnosis not present

## 2016-05-13 DIAGNOSIS — K130 Diseases of lips: Secondary | ICD-10-CM | POA: Diagnosis not present

## 2016-05-13 DIAGNOSIS — L578 Other skin changes due to chronic exposure to nonionizing radiation: Secondary | ICD-10-CM | POA: Diagnosis not present

## 2016-05-15 ENCOUNTER — Encounter (HOSPITAL_COMMUNITY): Payer: Medicare Other

## 2016-05-18 ENCOUNTER — Encounter (HOSPITAL_COMMUNITY): Payer: Medicare Other

## 2016-05-20 ENCOUNTER — Encounter (HOSPITAL_COMMUNITY): Payer: Medicare Other

## 2016-05-20 ENCOUNTER — Other Ambulatory Visit: Payer: Self-pay

## 2016-05-20 MED ORDER — ATORVASTATIN CALCIUM 20 MG PO TABS: 20.0000 mg | ORAL_TABLET | Freq: Every day | ORAL | 3 refills | Status: DC

## 2016-05-22 ENCOUNTER — Encounter (HOSPITAL_COMMUNITY): Payer: Medicare Other

## 2016-05-25 ENCOUNTER — Encounter: Payer: Self-pay | Admitting: Family Medicine

## 2016-05-25 ENCOUNTER — Ambulatory Visit (INDEPENDENT_AMBULATORY_CARE_PROVIDER_SITE_OTHER): Payer: Medicare Other | Admitting: Family Medicine

## 2016-05-25 VITALS — BP 118/82 | HR 86 | Temp 97.3°F | Ht 67.0 in | Wt 142.0 lb

## 2016-05-25 DIAGNOSIS — R0981 Nasal congestion: Secondary | ICD-10-CM | POA: Diagnosis not present

## 2016-05-25 DIAGNOSIS — H6983 Other specified disorders of Eustachian tube, bilateral: Secondary | ICD-10-CM | POA: Diagnosis not present

## 2016-05-25 MED ORDER — FLUCONAZOLE 150 MG PO TABS: ORAL_TABLET | ORAL | 0 refills | Status: DC

## 2016-05-25 MED ORDER — AMOXICILLIN-POT CLAVULANATE 875-125 MG PO TABS: 1.0000 | ORAL_TABLET | Freq: Two times a day (BID) | ORAL | 0 refills | Status: DC

## 2016-05-25 MED ORDER — MOMETASONE FUROATE 50 MCG/ACT NA SUSP: 2.0000 | Freq: Every day | NASAL | 1 refills | Status: DC

## 2016-05-25 NOTE — Progress Notes (Signed)
Pre visit review using our clinic review tool, if applicable. No additional management support is needed unless otherwise documented below in the visit note. 

## 2016-05-25 NOTE — Patient Instructions (Signed)
Use the nasonex as instructed.  If worsening or not improving over the next week take the antibiotic.  Follow up if worsening or not improving with treatment or if new concerns.

## 2016-05-25 NOTE — Progress Notes (Signed)
HPI:  URI: -started: 1 week ago and feels worsening - seems convinced she has a sinus infection and would like to take augmentin -symptoms:nasal congestion - clear, PND, ear pressure, sinus pressure and pain -denies:fever, SOB, NVD, cough -has tried: nothing -sick contacts/travel/risks: no reported flu, strep or tick exposure -Hx of: allergies - but does not like to take antihistamine and flonase drips down her throat and does not like it  ROS: See pertinent positives and negatives per HPI.  Past Medical History:  Diagnosis Date  . Heart murmur   . History of melanoma excision   . History of palpitations   . INSOMNIA, CHRONIC 03/29/2009  . Mitral regurgitation    severe by TEE  . MVP (mitral valve prolapse)   . Osteopenia 01/2014   T score -1.8 FRAX 8.8%/1.3%  . Paroxysmal atrial fibrillation ([place]) 10/18/2014  . Peripheral vascular disease ([place]) 10/16   RT LEG dvt  . Pneumonia 11/17   hx  . PONV (postoperative nausea and vomiting)   . Prepatellar bursitis    RT KNEE  . S/P minimally invasive maze operation for atrial fibrillation 11/19/2015   Complete bilateral atrial lesion set using cryothermy and bipolar radiofrequency ablation with clipping of LA appendage via right mini thoracotomy approch  . S/P minimally invasive mitral valve repair 11/19/2015   Complex valvuloplasty including artificial Gore-tex neochord placement x 22, closure of cleft in posterior leaflet, and 34 mm Edwards Physio II ring annuloplasty via right mini thoracotomy approach  . Skin cancer of arm    Squamous    Past Surgical History:  Procedure Laterality Date  . ABDOMINAL HYSTERECTOMY  1980   WITH BSO/FIBROIDS  . APPENDECTOMY  1969  . CARDIAC CATHETERIZATION N/A 10/21/2015   Procedure: Right/Left Heart Cath and Coronary Angiography;  Surgeon: Larey Dresser, MD;  Location: [place] [facility];  Service: Cardiovascular;  Laterality: N/A;  . CLIPPING OF ATRIAL APPENDAGE N/A 11/19/2015   Procedure:  CLIPPING OF ATRIAL APPENDAGE;  Surgeon: Rexene Alberts, MD;  Location: [place];  Service: Open Heart Surgery;  Laterality: N/A;  . COLONOSCOPY    . DIAGNOSTIC LAPAROSCOPY     expl  . EXCISION MELANOMA WITH SENTINEL LYMPH NODE BIOPSY Right 10/02/2013   Procedure: WIDE EXCISION MELANOMA RIGHT ARM WITH SENTINEL LYMPH NODE BIOPSY;  Surgeon: Shann Medal, MD;  Location: [place];  Service: General;  Laterality: Right;  . KNEE BURSECTOMY Right 03/05/2014   Procedure: RIGHT KNEE BURSECTOMY;  Surgeon: Gearlean Alf, MD;  [facility];  Service: Orthopedics;  Laterality: Right;  . MINIMALLY INVASIVE MAZE PROCEDURE N/A 11/19/2015   Procedure: MINIMALLY INVASIVE MAZE PROCEDURE;  Surgeon: Rexene Alberts, MD;  Location: [place];  Service: Open Heart Surgery;  Laterality: N/A;  . MITRAL VALVE REPAIR Right 11/19/2015   Procedure: MINIMALLY INVASIVE MITRAL VALVE REPAIR (MVR);  Surgeon: Rexene Alberts, MD;  Location: [facility] [place];  Service: Open Heart Surgery;  Laterality: Right;  . OOPHORECTOMY     BSO WITH ABDOMINAL HYSTERECTOMY  . SHOULDER ARTHROSCOPY  2010   right  . TEE WITHOUT CARDIOVERSION N/A 10/21/2015   Procedure: TRANSESOPHAGEAL ECHOCARDIOGRAM (TEE);  Surgeon: Larey Dresser, MD;  Location: [place];  Service: Cardiovascular;  Laterality: N/A;  . TEE WITHOUT CARDIOVERSION N/A 11/19/2015   Procedure: TRANSESOPHAGEAL ECHOCARDIOGRAM (TEE);  Surgeon: Rexene Alberts, MD;  Location: [place];  Service: Open Heart Surgery;  Laterality: N/A;  . THYROID CYST EXCISION  05-01-11   RIGHT  .  TONSILLECTOMY  1980    Family History  Problem Relation Age of Onset  . Diabetes type II Mother   . Alcohol abuse Father   . Throat cancer Father   . Ovarian cancer Maternal Aunt   . Breast cancer Maternal Grandmother     Social History   Social History  . Marital status: Married    Spouse name: N/A  . Number of children: N/A  . Years of education: N/A   Occupational History  . retired  Unemployed   Social History Main Topics  . Smoking status: Former Smoker    Packs/day: 1.00    Years: 20.00    Types: Cigarettes    Quit date: 08/11/1970  . Smokeless tobacco: Never Used  . Alcohol use 4.2 oz/week    7 Glasses of wine per week  . Drug use: No  . Sexual activity: Yes    Birth control/ protection: Surgical     Comment: intercourse age 61, sexual partners less than 5   Other Topics Concern  . None   Social History Narrative  . None     Current Outpatient Prescriptions:  .  atorvastatin (LIPITOR) 20 MG tablet, Take 1 tablet (20 mg total) by mouth daily., Disp: 90 tablet, Rfl: 3 .  desonide (DESOWEN) 0.05 % cream, Apply 1 application topically daily as needed. For itching per patient, Disp: , Rfl:  .  ibuprofen (ADVIL,MOTRIN) 200 MG tablet, Take 200 mg by mouth every 6 (six) hours as needed for moderate pain., Disp: , Rfl:  .  metoprolol succinate (TOPROL-XL) 25 MG 24 hr tablet, Take 1 tablet (25 mg total) by mouth daily., Disp: 30 tablet, Rfl: 3 .  nystatin (MYCOSTATIN/NYSTOP) 100000 UNIT/GM POWD, Apply 1 application topically daily as needed (skin). , Disp: , Rfl: 2 .  warfarin (COUMADIN) 4 MG tablet, Take 2-4 mg by mouth daily. Take 4mg  every day except on sundays take 2mg  per patient, Disp: , Rfl:  .  zolpidem (AMBIEN) 10 MG tablet, Take 1 tablet (10 mg total) by mouth at bedtime as needed. for sleep, Disp: 30 tablet, Rfl: 2 .  amoxicillin-clavulanate (AUGMENTIN) 875-125 MG tablet, Take 1 tablet by mouth 2 (two) times daily., Disp: 14 tablet, Rfl: 0 .  fluconazole (DIFLUCAN) 150 MG tablet, Take 1 tablet at once and may repeat in 1 week if needed., Disp: 2 tablet, Rfl: 0 .  mometasone (NASONEX) 50 MCG/ACT nasal spray, Place 2 sprays into the nose daily., Disp: 17 g, Rfl: 1  EXAM:  Vitals:   05/25/16 1622  BP: 118/82  Pulse: 86  Temp: 97.3 F (36.3 C)    Body mass index is 22.24 kg/m.  GENERAL: vitals reviewed and listed above, alert, oriented, appears  well hydrated and in no acute distress  HEENT: atraumatic, conjunttiva clear, no obvious abnormalities on inspection of external nose and ears, normal appearance of ear canals and TMs except for bilat clear effusion, clear nasal congestion, mild post oropharyngeal erythema with PND, no tonsillar edema or exudate, no sinus TTP  NECK: no obvious masses on inspection  MS: moves all extremities without noticeable abnormality  PSYCH: pleasant and cooperative, no obvious depression or anxiety  ASSESSMENT AND PLAN:  Discussed the following assessment and plan:  Sinus congestion  Dysfunction of both eustachian tubes  -given HPI and exam findings today, a serious infection or illness is unlikely. We discussed potential etiologies, with VURI or allergic rhinitis with 2ndary ETD being most likely. Opted to try nasonex and nasal saline  initially. She is very convinced that she has a sinus infection. Delayed abx after discussion risks/benefits in case worsening or not improving with treatment allergies/nasal congestion. We discussed treatment side effects, likely course, antibiotic misuse, transmission, and signs of developing a serious illness. -of course, we advised to return or notify a doctor immediately if symptoms worsen or persist or new concerns arise.    Patient Instructions  Use the nasonex as instructed.  If worsening or not improving over the next week take the antibiotic.  Follow up if worsening or not improving with treatment or if new concerns.   Colin Benton R., DO

## 2016-05-29 ENCOUNTER — Telehealth: Payer: Self-pay | Admitting: Family Medicine

## 2016-05-29 MED ORDER — FLUCONAZOLE 150 MG PO TABS: ORAL_TABLET | ORAL | 0 refills | Status: DC

## 2016-05-29 NOTE — Telephone Encounter (Signed)
° ° ° ° ° °  Pt husband said pt lost one of the pills and is asking if another one can be called in    fluconazole (DIFLUCAN) 150 MG tablet     [facility]

## 2016-05-29 NOTE — Telephone Encounter (Signed)
Rx done. 

## 2016-05-29 NOTE — Addendum Note (Signed)
Addended by: Agnes Lawrence on: 05/29/2016 02:24 PM   Modules accepted: Orders

## 2016-05-29 NOTE — Telephone Encounter (Signed)
ok 

## 2016-06-01 ENCOUNTER — Encounter: Payer: Self-pay | Admitting: Thoracic Surgery (Cardiothoracic Vascular Surgery)

## 2016-06-01 ENCOUNTER — Ambulatory Visit (INDEPENDENT_AMBULATORY_CARE_PROVIDER_SITE_OTHER): Payer: Medicare Other | Admitting: Thoracic Surgery (Cardiothoracic Vascular Surgery)

## 2016-06-01 VITALS — BP 120/85 | HR 87 | Resp 20 | Ht 67.0 in | Wt 140.0 lb

## 2016-06-01 DIAGNOSIS — Z9889 Other specified postprocedural states: Secondary | ICD-10-CM

## 2016-06-01 DIAGNOSIS — Z8679 Personal history of other diseases of the circulatory system: Secondary | ICD-10-CM | POA: Diagnosis not present

## 2016-06-01 NOTE — Progress Notes (Signed)
[address]       [place] [address]             [phone]     CARDIOTHORACIC SURGERY OFFICE NOTE  Referring Provider is Larey Dresser, MD PCP is [name], MD   HPI:  Patient is a 70 year old female who returns to the office today for routine follow-up approximately 6 months status post minimally invasive mitral valve repair and Maze procedure on 09/19/2015 for severe symptom attic primary mitral regurgitation and recurrent paroxysmal atrial fibrillation. Her postoperative recovery has been uneventful. Follow-up echocardiogram performed 01/03/2016 revealed intact mitral valve repair with no significant mitral regurgitation, normal transvalvular gradient, and normal left ventricular systolic function. She was last seen in our office for follow-up on 01/27/2016 at which time she was maintaining sinus rhythm and doing quite well. She has been seen in follow-up since then by Dr. Aundra Dubin. In addition, she follows up intermittently with Roderic Palau in the atrial fibrillation clinic for long-term surveillance following Maze procedure.  She returns to our office for routine follow-up today. She reports that she feels quite well. Her exercise tolerance is quite good. She exercises regularly and plays tennis. She reports no exertional shortness of breath whatsoever. In addition, she she states that she has not had any recurrent episodes of palpitations associated with chest discomfort and shortness of breath such that she had experienced prior to her surgery last year. Overall she feels quite well and she is pleased with her recovery. She no longer has any pain in her chest. She does feel some numbness along the right breast and chest wall related to her incision.  Current Outpatient Prescriptions  Medication Sig Dispense Refill  . atorvastatin (LIPITOR) 20 MG tablet Take 1 tablet (20 mg total) by mouth daily. 90 tablet 3  . desonide (DESOWEN) 0.05 % cream Apply 1  application topically daily as needed. For itching per patient    . fluconazole (DIFLUCAN) 150 MG tablet Take 1 tablet at once 1 tablet 0  . ibuprofen (ADVIL,MOTRIN) 200 MG tablet Take 200 mg by mouth every 6 (six) hours as needed for moderate pain.    . metoprolol succinate (TOPROL-XL) 25 MG 24 hr tablet Take 1 tablet (25 mg total) by mouth daily. 30 tablet 3  . mometasone (NASONEX) 50 MCG/ACT nasal spray Place 2 sprays into the nose daily. 17 g 1  . nystatin (MYCOSTATIN/NYSTOP) 100000 UNIT/GM POWD Apply 1 application topically daily as needed (skin).   2  . warfarin (COUMADIN) 4 MG tablet Take 2-4 mg by mouth daily. Take 4mg  every day except on sundays take 2mg  per patient    . zolpidem (AMBIEN) 10 MG tablet Take 1 tablet (10 mg total) by mouth at bedtime as needed. for sleep 30 tablet 2   No current facility-administered medications for this visit.       Physical Exam:   BP 120/85   Pulse 87   Resp 20   Ht 5\' 7"  (1.702 m)   Wt 140 lb (63.5 kg)   LMP 06/22/1977   SpO2 98%   BMI 21.93 kg/m   General:  Well-appearing  Chest:   Clear to auscultation  CV:   Regular rate and rhythm without murmur  Incisions:  Completely healed  Abdomen:  Soft nontender  Extremities:  Warm and well-perfused  Diagnostic Tests:  2 channel telemetry rhythm strip demonstrates normal sinus rhythm   Impression:  Patient is doing very well approximately 6  months status post minimally invasive mitral valve repair and maze procedure. She is maintaining sinus rhythm. Exercise tolerance is remarkably good. She is asymptomatic. She remains anticoagulated using warfarin.  CHADS/VASC score 2 for female sex and age.  Plan:  We have not recommended any changes to the patient's current medications. She will continue to follow-up with Dr. Aundra Dubin and with Roderic Palau in the atrial fibrillation clinic. The patient will return to our office for routine follow-up and rhythm check in 6 months.  The patient has been  reminded regarding the importance of dental hygiene and the lifelong need for antibiotic prophylaxis for all dental cleanings and other related invasive procedures.  I spent in excess of 15 minutes during the conduct of this office consultation and >50% of this time involved direct face-to-face encounter with the patient for counseling and/or coordination of their care.   [name], MD 06/01/2016 11:41 AM

## 2016-06-01 NOTE — Patient Instructions (Signed)

## 2016-06-03 ENCOUNTER — Other Ambulatory Visit: Payer: Self-pay | Admitting: Women's Health

## 2016-06-03 DIAGNOSIS — Z1231 Encounter for screening mammogram for malignant neoplasm of breast: Secondary | ICD-10-CM

## 2016-06-04 ENCOUNTER — Other Ambulatory Visit: Payer: Self-pay | Admitting: Family Medicine

## 2016-06-05 ENCOUNTER — Ambulatory Visit (INDEPENDENT_AMBULATORY_CARE_PROVIDER_SITE_OTHER): Payer: Medicare Other | Admitting: Pharmacist Clinician (PhC)/ Clinical Pharmacy Specialist

## 2016-06-05 ENCOUNTER — Ambulatory Visit: Payer: Medicare Other

## 2016-06-05 DIAGNOSIS — Z5181 Encounter for therapeutic drug level monitoring: Secondary | ICD-10-CM | POA: Diagnosis not present

## 2016-06-05 DIAGNOSIS — Z9889 Other specified postprocedural states: Secondary | ICD-10-CM | POA: Diagnosis not present

## 2016-06-05 DIAGNOSIS — I480 Paroxysmal atrial fibrillation: Secondary | ICD-10-CM | POA: Diagnosis not present

## 2016-06-05 DIAGNOSIS — Z8679 Personal history of other diseases of the circulatory system: Secondary | ICD-10-CM

## 2016-06-05 LAB — POCT INR: INR: 2.1

## 2016-06-09 ENCOUNTER — Other Ambulatory Visit: Payer: Self-pay | Admitting: *Deleted

## 2016-06-09 ENCOUNTER — Ambulatory Visit: Payer: Medicare Other

## 2016-06-09 MED ORDER — WARFARIN SODIUM 4 MG PO TABS: 2.0000 mg | ORAL_TABLET | Freq: Every day | ORAL | 2 refills | Status: DC

## 2016-06-09 NOTE — Telephone Encounter (Signed)
Last OV w/ Dr. Maudie Mercury 05/25/2016 acute Last refill 03-11-2016 #30, 2rf Please advise

## 2016-06-09 NOTE — Telephone Encounter (Signed)
Refill with 2 additional refills. 

## 2016-06-09 NOTE — Telephone Encounter (Signed)
The patient needs zolpidem (AMBIEN) 10 MG tablet  sent to: [facility] [place], [place] [place] AT [place] [phone] (Phone) [phone] (Fax)

## 2016-06-10 MED ORDER — ZOLPIDEM TARTRATE 10 MG PO TABS: 10.0000 mg | ORAL_TABLET | Freq: Every evening | ORAL | 2 refills | Status: DC | PRN

## 2016-06-10 NOTE — Telephone Encounter (Signed)
Rx called in to pharmacy. 

## 2016-06-11 ENCOUNTER — Other Ambulatory Visit: Payer: Self-pay | Admitting: Emergency Medicine

## 2016-06-29 DIAGNOSIS — M7062 Trochanteric bursitis, left hip: Secondary | ICD-10-CM | POA: Diagnosis not present

## 2016-07-03 ENCOUNTER — Ambulatory Visit (INDEPENDENT_AMBULATORY_CARE_PROVIDER_SITE_OTHER): Payer: Medicare Other

## 2016-07-03 DIAGNOSIS — I480 Paroxysmal atrial fibrillation: Secondary | ICD-10-CM | POA: Diagnosis not present

## 2016-07-03 DIAGNOSIS — Z5181 Encounter for therapeutic drug level monitoring: Secondary | ICD-10-CM

## 2016-07-03 DIAGNOSIS — Z8679 Personal history of other diseases of the circulatory system: Secondary | ICD-10-CM

## 2016-07-03 DIAGNOSIS — Z9889 Other specified postprocedural states: Secondary | ICD-10-CM

## 2016-07-03 LAB — POCT INR: INR: 1.4

## 2016-07-04 DIAGNOSIS — M7062 Trochanteric bursitis, left hip: Secondary | ICD-10-CM | POA: Diagnosis not present

## 2016-07-07 ENCOUNTER — Ambulatory Visit (INDEPENDENT_AMBULATORY_CARE_PROVIDER_SITE_OTHER): Payer: Medicare Other

## 2016-07-07 DIAGNOSIS — Z23 Encounter for immunization: Secondary | ICD-10-CM

## 2016-07-08 ENCOUNTER — Ambulatory Visit: Payer: Medicare Other

## 2016-07-17 ENCOUNTER — Ambulatory Visit (INDEPENDENT_AMBULATORY_CARE_PROVIDER_SITE_OTHER): Payer: Medicare Other

## 2016-07-17 DIAGNOSIS — I480 Paroxysmal atrial fibrillation: Secondary | ICD-10-CM

## 2016-07-17 DIAGNOSIS — M7062 Trochanteric bursitis, left hip: Secondary | ICD-10-CM | POA: Diagnosis not present

## 2016-07-17 DIAGNOSIS — Z5181 Encounter for therapeutic drug level monitoring: Secondary | ICD-10-CM

## 2016-07-17 DIAGNOSIS — Z8679 Personal history of other diseases of the circulatory system: Secondary | ICD-10-CM | POA: Diagnosis not present

## 2016-07-17 DIAGNOSIS — Z9889 Other specified postprocedural states: Secondary | ICD-10-CM

## 2016-07-17 LAB — POCT INR: INR: 2

## 2016-07-23 ENCOUNTER — Encounter: Payer: Self-pay | Admitting: Women's Health

## 2016-07-23 ENCOUNTER — Ambulatory Visit (INDEPENDENT_AMBULATORY_CARE_PROVIDER_SITE_OTHER): Payer: Medicare Other | Admitting: Women's Health

## 2016-07-23 VITALS — BP 114/70 | Ht 68.0 in | Wt 144.0 lb

## 2016-07-23 DIAGNOSIS — Z780 Asymptomatic menopausal state: Secondary | ICD-10-CM

## 2016-07-23 DIAGNOSIS — M8580 Other specified disorders of bone density and structure, unspecified site: Secondary | ICD-10-CM | POA: Diagnosis not present

## 2016-07-23 DIAGNOSIS — N760 Acute vaginitis: Secondary | ICD-10-CM | POA: Diagnosis not present

## 2016-07-23 NOTE — Patient Instructions (Signed)

## 2016-07-23 NOTE — Progress Notes (Signed)
[name] [birth year] [id number]    History:    Presents for breast and pelvic exam TAH with BSO on no HRT. 10/2015 mitral valve repair has completed cardiac rehabilitation and is back to her usual routine of playing tennis and active lifestyle. Normal Pap and mammogram history. Current on vaccines. 01/2014 osteopenia T score -1.8 FRAX 8.8%/1.3%. Not sexually active/husbands health.  Past medical history, past surgical history, family history and social history were all reviewed and documented in the EPIC chart. Retired, husband is having back and neck problems.  ROS:  A ROS was performed and pertinent positives and negatives are included.  Exam:  Vitals:   07/23/16 1426  BP: 114/70  Weight: 144 lb (65.3 kg)  Height: 5\' 8"  (1.727 m)   Body mass index is 21.9 kg/m.   General appearance:  Normal Thyroid:  Symmetrical, normal in size, without palpable masses or nodularity. Respiratory  Auscultation:  Clear without wheezing or rhonchi Cardiovascular  Auscultation:  Regular rate, without rubs, murmurs or gallops  Edema/varicosities:  Not grossly evident Abdominal  Soft,nontender, without masses, guarding or rebound.  Liver/spleen:  No organomegaly noted  Hernia:  None appreciated  Skin  Inspection:  Grossly normal   Breasts: Examined lying and sitting.     Right: Without masses, retractions, discharge or axillary adenopathy.     Left: Without masses, retractions, discharge or axillary adenopathy. Gentitourinary   Inguinal/mons:  Normal without inguinal adenopathy  External genitalia:  Normal  BUS/Urethra/Skene's glands:  Normal  Vagina:  Atrophic/asymptomatic  Cervix:  And uterus absent  Adnexa/parametria:     Rt: Without masses or tenderness.   Lt: Without masses or tenderness.  Anus and perineum: Normal  Digital rectal exam: Normal sphincter tone without palpated masses or tenderness  Assessment/Plan:  71 y.o. M WF G3 P2 +2 stepchildren for breast and pelvic exam.  TAH  with BSO on no HRT 10/2015 mitral valve repair/completed cardiac rehabilitation on Coumadin Osteopenia without elevated FRAX Hypertension/hypercholesterolemia cardiologist manages History of melanoma annual skin checks with dermatologist  Plan: Repeat DEXA, instructed to schedule. Home safety, fall prevention and importance of weightbearing exercise reviewed, SBE's, annual screening mammogram has scheduled next week.    Huel Cote WHNP, 3:02 PM 07/23/2016

## 2016-07-27 ENCOUNTER — Other Ambulatory Visit: Payer: Self-pay | Admitting: Gynecology

## 2016-07-27 DIAGNOSIS — D1801 Hemangioma of skin and subcutaneous tissue: Secondary | ICD-10-CM | POA: Diagnosis not present

## 2016-07-27 DIAGNOSIS — M8580 Other specified disorders of bone density and structure, unspecified site: Secondary | ICD-10-CM

## 2016-07-27 DIAGNOSIS — D225 Melanocytic nevi of trunk: Secondary | ICD-10-CM | POA: Diagnosis not present

## 2016-07-27 DIAGNOSIS — M899 Disorder of bone, unspecified: Secondary | ICD-10-CM

## 2016-07-27 DIAGNOSIS — L821 Other seborrheic keratosis: Secondary | ICD-10-CM | POA: Diagnosis not present

## 2016-07-27 DIAGNOSIS — Z85820 Personal history of malignant melanoma of skin: Secondary | ICD-10-CM | POA: Diagnosis not present

## 2016-07-27 DIAGNOSIS — L570 Actinic keratosis: Secondary | ICD-10-CM | POA: Diagnosis not present

## 2016-07-27 DIAGNOSIS — Z780 Asymptomatic menopausal state: Secondary | ICD-10-CM

## 2016-07-27 DIAGNOSIS — Z85828 Personal history of other malignant neoplasm of skin: Secondary | ICD-10-CM | POA: Diagnosis not present

## 2016-07-29 DIAGNOSIS — M76892 Other specified enthesopathies of left lower limb, excluding foot: Secondary | ICD-10-CM | POA: Diagnosis not present

## 2016-07-29 DIAGNOSIS — M7062 Trochanteric bursitis, left hip: Secondary | ICD-10-CM | POA: Diagnosis not present

## 2016-07-31 ENCOUNTER — Ambulatory Visit
Admission: RE | Admit: 2016-07-31 | Discharge: 2016-07-31 | Disposition: A | Discharge status: Home / Self Care | Payer: Medicare Other | Source: Ambulatory Visit | Attending: Women's Health | Admitting: Women's Health

## 2016-07-31 DIAGNOSIS — Z1231 Encounter for screening mammogram for malignant neoplasm of breast: Secondary | ICD-10-CM

## 2016-08-06 ENCOUNTER — Ambulatory Visit (INDEPENDENT_AMBULATORY_CARE_PROVIDER_SITE_OTHER): Payer: Medicare Other

## 2016-08-06 ENCOUNTER — Other Ambulatory Visit: Payer: Self-pay | Admitting: Gynecology

## 2016-08-06 DIAGNOSIS — Z780 Asymptomatic menopausal state: Secondary | ICD-10-CM

## 2016-08-06 DIAGNOSIS — M8589 Other specified disorders of bone density and structure, multiple sites: Secondary | ICD-10-CM | POA: Diagnosis not present

## 2016-08-06 DIAGNOSIS — M899 Disorder of bone, unspecified: Secondary | ICD-10-CM

## 2016-08-07 ENCOUNTER — Encounter: Payer: Self-pay | Admitting: Gynecology

## 2016-08-07 DIAGNOSIS — M76892 Other specified enthesopathies of left lower limb, excluding foot: Secondary | ICD-10-CM | POA: Diagnosis not present

## 2016-08-11 ENCOUNTER — Encounter: Payer: Self-pay | Admitting: Cardiology

## 2016-08-11 ENCOUNTER — Ambulatory Visit (INDEPENDENT_AMBULATORY_CARE_PROVIDER_SITE_OTHER): Payer: Medicare Other | Admitting: *Deleted

## 2016-08-11 ENCOUNTER — Ambulatory Visit (INDEPENDENT_AMBULATORY_CARE_PROVIDER_SITE_OTHER): Payer: Medicare Other | Admitting: Cardiology

## 2016-08-11 VITALS — BP 115/76 | HR 85 | Ht 69.0 in | Wt 144.4 lb

## 2016-08-11 DIAGNOSIS — Z5181 Encounter for therapeutic drug level monitoring: Secondary | ICD-10-CM

## 2016-08-11 DIAGNOSIS — Z9889 Other specified postprocedural states: Secondary | ICD-10-CM | POA: Diagnosis not present

## 2016-08-11 DIAGNOSIS — I480 Paroxysmal atrial fibrillation: Secondary | ICD-10-CM

## 2016-08-11 DIAGNOSIS — Z8679 Personal history of other diseases of the circulatory system: Secondary | ICD-10-CM

## 2016-08-11 DIAGNOSIS — R5383 Other fatigue: Secondary | ICD-10-CM

## 2016-08-11 DIAGNOSIS — L570 Actinic keratosis: Secondary | ICD-10-CM | POA: Diagnosis not present

## 2016-08-11 DIAGNOSIS — R5382 Chronic fatigue, unspecified: Secondary | ICD-10-CM

## 2016-08-11 LAB — POCT INR: INR: 1.8

## 2016-08-11 MED ORDER — DILTIAZEM HCL ER COATED BEADS 120 MG PO CP24: 120.0000 mg | ORAL_CAPSULE | Freq: Every day | ORAL | 3 refills | Status: DC

## 2016-08-11 NOTE — Progress Notes (Signed)
Cardiology Office Note    Date:  08/11/2016   ID:  [name], DOB [birth year], MRN [id number]  PCP:  [name], MD  Cardiologist:  Dr Aundra Dubin --> Ena Dawley, MD   Chief complain: Months follow-up.  History of Present Illness:  [name] is a 71 y.o. female with h/o minimally invasive mitral valve repair and Maze procedure on 11/19/2015 for severe symptomatic primary mitral regurgitation and recurrent paroxysmal atrial fibrillation. She was seen 5 months s/p surgery in the a-fib clinic and reported no symptoms that would indicate return of afib. She has been off amiodarone for several months. She has finished cardiac rehab and continues to exercise on her own at a local gym and has returned to playing tennis.She feels that she is much improved from surgery but still notices some numbness and mild discomfort at the incision site and an occassional cramp in her rt hand. She continues on warfarin.  08/11/16 - the patient states that she has been feeling great. She is able to go to gym and walks 3 miles a day. She hasn't been playing much tenderness as she has hamstring injury. Otherwise she denies presyncope or syncope no lower extremity edema no orthopnea or proximal nocturnal dyspnea she denies any palpitations. Her only complaint is profound fatigue that she has had in the past but now she feels it's worse.  Past Medical History:  Diagnosis Date  . Heart murmur   . History of melanoma excision   . History of palpitations   . INSOMNIA, CHRONIC 03/29/2009  . Mitral regurgitation    severe by TEE  . MVP (mitral valve prolapse)   . Osteopenia 07/2016   T score -2.1 FRAX 11%/2.1%  . Paroxysmal atrial fibrillation ([facility]) 10/18/2014  . Peripheral vascular disease ([place] [facility]) 10/16   RT LEG dvt  . Pneumonia 11/17   hx  . PONV (postoperative nausea and vomiting)   . Prepatellar bursitis    RT KNEE  . S/P minimally invasive maze operation for atrial fibrillation 11/19/2015   Complete  bilateral atrial lesion set using cryothermy and bipolar radiofrequency ablation with clipping of LA appendage via right mini thoracotomy approch  . S/P minimally invasive mitral valve repair 11/19/2015   Complex valvuloplasty including artificial Gore-tex neochord placement x 22, closure of cleft in posterior leaflet, and 34 mm Edwards Physio II ring annuloplasty via right mini thoracotomy approach  . Skin cancer of arm    Squamous    Past Surgical History:  Procedure Laterality Date  . ABDOMINAL HYSTERECTOMY  1980   WITH BSO/FIBROIDS  . APPENDECTOMY  1969  . CARDIAC CATHETERIZATION N/A 10/21/2015   Procedure: Right/Left Heart Cath and Coronary Angiography;  Surgeon: Larey Dresser, MD;  Location: [facility];  Service: Cardiovascular;  Laterality: N/A;  . CLIPPING OF ATRIAL APPENDAGE N/A 11/19/2015   Procedure: CLIPPING OF ATRIAL APPENDAGE;  Surgeon: Rexene Alberts, MD;  Location: [facility];  Service: Open Heart Surgery;  Laterality: N/A;  . COLONOSCOPY    . DIAGNOSTIC LAPAROSCOPY     expl  . EXCISION MELANOMA WITH SENTINEL LYMPH NODE BIOPSY Right 10/02/2013   Procedure: WIDE EXCISION MELANOMA RIGHT ARM WITH SENTINEL LYMPH NODE BIOPSY;  Surgeon: Shann Medal, MD;  Location: [facility];  Service: General;  Laterality: Right;  . KNEE BURSECTOMY Right 03/05/2014   Procedure: RIGHT KNEE BURSECTOMY;  Surgeon: Gearlean Alf, MD;  [facility]: [facility];  Service: Orthopedics;  Laterality: Right;  . MINIMALLY  INVASIVE MAZE PROCEDURE N/A 11/19/2015   Procedure: MINIMALLY INVASIVE MAZE PROCEDURE;  Surgeon: Rexene Alberts, MD;  Location: [facility];  Service: Open Heart Surgery;  Laterality: N/A;  . MITRAL VALVE REPAIR Right 11/19/2015   Procedure: MINIMALLY INVASIVE MITRAL VALVE REPAIR (MVR);  Surgeon: Rexene Alberts, MD;  Location: [facility];  Service: Open Heart Surgery;  Laterality: Right;  . OOPHORECTOMY     BSO WITH ABDOMINAL HYSTERECTOMY  . SHOULDER ARTHROSCOPY  2010   right  . TEE  WITHOUT CARDIOVERSION N/A 10/21/2015   Procedure: TRANSESOPHAGEAL ECHOCARDIOGRAM (TEE);  Surgeon: Larey Dresser, MD;  Location: [facility];  Service: Cardiovascular;  Laterality: N/A;  . TEE WITHOUT CARDIOVERSION N/A 11/19/2015   Procedure: TRANSESOPHAGEAL ECHOCARDIOGRAM (TEE);  Surgeon: Rexene Alberts, MD;  Location: [facility];  Service: Open Heart Surgery;  Laterality: N/A;  . THYROID CYST EXCISION  05-01-11   RIGHT  . TONSILLECTOMY  1980    Current Medications: Outpatient Medications Prior to Visit  Medication Sig Dispense Refill  . atorvastatin (LIPITOR) 20 MG tablet Take 1 tablet (20 mg total) by mouth daily. 90 tablet 3  . desonide (DESOWEN) 0.05 % cream Apply 1 application topically daily as needed. For itching per patient    . ibuprofen (ADVIL,MOTRIN) 200 MG tablet Take 200 mg by mouth every 6 (six) hours as needed for moderate pain.    . mometasone (NASONEX) 50 MCG/ACT nasal spray Place 2 sprays into the nose daily. 17 g 1  . nystatin (MYCOSTATIN/NYSTOP) 100000 UNIT/GM POWD Apply 1 application topically daily as needed (skin).   2  . warfarin (COUMADIN) 4 MG tablet Take 0.5-1 tablets (2-4 mg total) by mouth daily. Take 4mg  every day except on sundays take 2mg  per patient 30 tablet 2  . zolpidem (AMBIEN) 10 MG tablet Take 1 tablet (10 mg total) by mouth at bedtime as needed. for sleep 30 tablet 2  . metoprolol succinate (TOPROL-XL) 25 MG 24 hr tablet Take 1 tablet (25 mg total) by mouth daily. 30 tablet 3   No facility-administered medications prior to visit.      Allergies:   Inderal [propranolol hcl]; Latex; and Tape   Social History   Social History  . Marital status: Married    Spouse name: N/A  . Number of children: N/A  . Years of education: N/A   Occupational History  . retired Unemployed   Social History Main Topics  . Smoking status: Former Smoker    Packs/day: 1.00    Years: 20.00    Types: Cigarettes    Quit date: 08/11/1970  . Smokeless tobacco: Never Used    . Alcohol use 4.2 oz/week    7 Glasses of wine per week  . Drug use: No  . Sexual activity: Yes    Birth control/ protection: Surgical     Comment: intercourse age 74, sexual partners less than 5   Other Topics Concern  . None   Social History Narrative  . None     Family History:  The patient's family history includes Alcohol abuse in her father; Breast cancer in her maternal grandmother; Diabetes type II in her mother; Ovarian cancer in her maternal aunt; Throat cancer in her father.   ROS:   Please see the history of present illness.    ROS All other systems reviewed and are negative.   PHYSICAL EXAM:   VS:  BP 115/76   Pulse 85   Ht 5\' 9"  (1.753 m)   Wt 144  lb 6.4 oz (65.5 kg)   LMP 06/22/1977   BMI 21.32 kg/m    GEN: Well nourished, well developed, in no acute distress  HEENT: normal  Neck: no JVD, carotid bruits, or masses Cardiac: RRR; no murmurs, rubs, or gallops,no edema  Respiratory:  clear to auscultation bilaterally, normal work of breathing GI: soft, nontender, nondistended, + BS MS: no deformity or atrophy  Skin: warm and dry, no rash Neuro:  Alert and Oriented x 3, Strength and sensation are intact Psych: euthymic mood, full affect  Wt Readings from Last 3 Encounters:  08/11/16 144 lb 6.4 oz (65.5 kg)  07/23/16 144 lb (65.3 kg)  06/01/16 140 lb (63.5 kg)      Studies/Labs Reviewed:   EKG:  EKG is not ordered today.    Recent Labs: 11/20/2015: Magnesium 2.3 12/17/2015: TSH 2.45 02/20/2016: ALT 27; BUN 17; Creatinine, Ser 0.92; Hemoglobin 14.7; Platelets 302; Potassium 4.3; Sodium 140   Lipid Panel    Component Value Date/Time   CHOL 206 (H) 10/19/2013 0952   TRIG 66.0 10/19/2013 0952   HDL 92.70 10/19/2013 0952   CHOLHDL 2 10/19/2013 0952   VLDL 13.2 10/19/2013 0952   LDLCALC 100 (H) 10/19/2013 0952   LDLDIRECT 79.2 11/11/2009 1039    Additional studies/ records that were reviewed today include:  TTE: 7/17 Left ventricle: The cavity  size was normal. Systolic function was   normal. The estimated ejection fraction was in the range of 55%   to 60%. Wall motion was normal; there were no regional wall   motion abnormalities. The study is not technically sufficient to   allow evaluation of LV diastolic function. - Aortic valve: There was trivial regurgitation. - Left atrium: The atrium was mildly dilated. - Right ventricle: The cavity size was mildly dilated. Wall   thickness was normal. - Right atrium: The atrium was moderately dilated. - Tricuspid valve: There was moderate regurgitation. - Pulmonary arteries: Systolic pressure was within the normal   range. PA peak pressure: 29 mm Hg (S). - Inferior vena cava: The vessel was normal in size. - Pericardium, extracardiac: A trivial pericardial effusion was   identified.  Impressions:  - When compared to the prior study from 10/21/2015 mitral valve is   post repair. There is residual regurgitation and normal   transmitral gradients.  LHC: 10/2015  1. Normal left and right heart filling pressures.  2. Nonobstructive CAD.  3. Normal EF, mitral regurgitation does appear as marked by LV-gram as by TEE, likely due to eccentricity.     ASSESSMENT:    1. S/P minimally invasive mitral valve repair + maze procedure   2. S/P minimally invasive maze operation for atrial fibrillation   3. Encounter for therapeutic drug monitoring   4. Paroxysmal atrial fibrillation (HCC)   5. Fatigue, unspecified type   6. Chronic fatigue      PLAN:  In order of problems listed above:  1. Looks great with no signs that would suggest recurrent CO for atrial fibrillation, she has been off amiodarone for at least 6 months. She feels profoundly tired we will discontinued Toprol-XL and start Cardizem CD 120 mg daily to be taken as night as her blood pressure is on the lower side. 2. She is tolerating atorvastatin well with no side effects. 3. Follow up in 2 months.    Medication  Adjustments/Labs and Tests Ordered: Current medicines are reviewed at length with the patient today.  Concerns regarding medicines are outlined above.  Medication  changes, Labs and Tests ordered today are listed in the Patient Instructions below. Patient Instructions  Medication Instructions:   STOP TAKING TOPROL XL (METOPROLOL SUCCINATE)  START TAKING CARDIZEM CD (DILTIAZEM) 120 MG ONCE DAILY     Follow-Up:  2 MONTHS WITH DR Meda Coffee       If you need a refill on your cardiac medications before your next appointment, please call your pharmacy.      Signed, Ena Dawley, MD  08/11/2016 12:04 PM    [address], [place],   [address] Phone: [phone]; Fax: [phone]

## 2016-08-11 NOTE — Patient Instructions (Signed)
Medication Instructions:   STOP TAKING TOPROL XL (METOPROLOL SUCCINATE)  START TAKING CARDIZEM CD (DILTIAZEM) 120 MG ONCE DAILY     Follow-Up:  2 MONTHS WITH DR Meda Coffee       If you need a refill on your cardiac medications before your next appointment, please call your pharmacy.

## 2016-08-13 DIAGNOSIS — M76892 Other specified enthesopathies of left lower limb, excluding foot: Secondary | ICD-10-CM | POA: Diagnosis not present

## 2016-08-25 ENCOUNTER — Ambulatory Visit (INDEPENDENT_AMBULATORY_CARE_PROVIDER_SITE_OTHER): Payer: Medicare Other | Admitting: *Deleted

## 2016-08-25 DIAGNOSIS — Z5181 Encounter for therapeutic drug level monitoring: Secondary | ICD-10-CM | POA: Diagnosis not present

## 2016-08-25 DIAGNOSIS — I480 Paroxysmal atrial fibrillation: Secondary | ICD-10-CM

## 2016-08-25 DIAGNOSIS — Z8679 Personal history of other diseases of the circulatory system: Secondary | ICD-10-CM

## 2016-08-25 DIAGNOSIS — Z9889 Other specified postprocedural states: Secondary | ICD-10-CM | POA: Diagnosis not present

## 2016-08-25 LAB — POCT INR: INR: 1.6

## 2016-08-27 DIAGNOSIS — M76892 Other specified enthesopathies of left lower limb, excluding foot: Secondary | ICD-10-CM | POA: Diagnosis not present

## 2016-08-28 ENCOUNTER — Other Ambulatory Visit: Payer: Self-pay | Admitting: Family Medicine

## 2016-08-28 NOTE — Telephone Encounter (Signed)
Refill with 2 additional refills. 

## 2016-08-28 NOTE — Telephone Encounter (Signed)
Last refill 08/06/16.  Last office visit 03/17/16.  Okay to fill?

## 2016-09-02 ENCOUNTER — Ambulatory Visit (INDEPENDENT_AMBULATORY_CARE_PROVIDER_SITE_OTHER): Payer: Medicare Other | Admitting: *Deleted

## 2016-09-02 DIAGNOSIS — Z9889 Other specified postprocedural states: Secondary | ICD-10-CM

## 2016-09-02 DIAGNOSIS — Z8679 Personal history of other diseases of the circulatory system: Secondary | ICD-10-CM | POA: Diagnosis not present

## 2016-09-02 DIAGNOSIS — I480 Paroxysmal atrial fibrillation: Secondary | ICD-10-CM

## 2016-09-02 DIAGNOSIS — Z5181 Encounter for therapeutic drug level monitoring: Secondary | ICD-10-CM | POA: Diagnosis not present

## 2016-09-02 LAB — POCT INR: INR: 3.2

## 2016-09-17 DIAGNOSIS — M7062 Trochanteric bursitis, left hip: Secondary | ICD-10-CM | POA: Diagnosis not present

## 2016-09-17 DIAGNOSIS — M76892 Other specified enthesopathies of left lower limb, excluding foot: Secondary | ICD-10-CM | POA: Diagnosis not present

## 2016-09-21 ENCOUNTER — Ambulatory Visit (INDEPENDENT_AMBULATORY_CARE_PROVIDER_SITE_OTHER): Payer: Medicare Other | Admitting: Family Medicine

## 2016-09-21 ENCOUNTER — Encounter: Payer: Self-pay | Admitting: Family Medicine

## 2016-09-21 VITALS — BP 102/70 | HR 93 | Temp 98.3°F | Wt 145.6 lb

## 2016-09-21 DIAGNOSIS — R1031 Right lower quadrant pain: Secondary | ICD-10-CM

## 2016-09-21 DIAGNOSIS — R1032 Left lower quadrant pain: Secondary | ICD-10-CM

## 2016-09-21 MED ORDER — DICYCLOMINE HCL 10 MG PO CAPS: 10.0000 mg | ORAL_CAPSULE | Freq: Three times a day (TID) | ORAL | 1 refills | Status: DC

## 2016-09-21 NOTE — Patient Instructions (Signed)
Follow up for any fever, vomiting, or progressive/persistent abdominal pain.

## 2016-09-21 NOTE — Progress Notes (Signed)
Pre visit review using our clinic review tool, if applicable. No additional management support is needed unless otherwise documented below in the visit note. 

## 2016-09-21 NOTE — Progress Notes (Signed)
Subjective:     Patient ID: [name], female   DOB: 08/04/1945, 71 y.o.   MRN: [id number]  HPI  Patient relates three-week history of relatively constant lower abdominal bilateral "cramping ". She has frequent bloated feeling. She has history of reported IBS and has had somewhat similar pain symptoms the past. She does have occasional alternating constipation and diarrhea. No bloody stools. Denies any appetite changes or weight changes. Denies nausea, vomiting, or recent diarrhea. She did try some over-the-counter MiraLAX several days ago for some constipation but cramping symptoms did not seem to improve with that. She's not had consistent constipation symptoms. Her last colonoscopy was about 10 years ago.  No dysuria.  Hx of TAH/BSO 1980.  She had minimally invasive mitral valve repair procedure last year and that went well. Her other chronic problems include history of atrial fibrillation and remote history of melanoma. She remains on Coumadin.  No hematochezia.  Past Medical History:  Diagnosis Date  . Heart murmur   . History of melanoma excision   . History of palpitations   . INSOMNIA, CHRONIC 03/29/2009  . Mitral regurgitation    severe by TEE  . MVP (mitral valve prolapse)   . Osteopenia 07/2016   T score -2.1 FRAX 11%/2.1%  . Paroxysmal atrial fibrillation ([facility]) 10/18/2014  . Peripheral vascular disease ([facility]) 10/16   RT LEG dvt  . Pneumonia 11/17   hx  . PONV (postoperative nausea and vomiting)   . Prepatellar bursitis    RT KNEE  . S/P minimally invasive maze operation for atrial fibrillation 11/19/2015   Complete bilateral atrial lesion set using cryothermy and bipolar radiofrequency ablation with clipping of LA appendage via right mini thoracotomy approch  . S/P minimally invasive mitral valve repair 11/19/2015   Complex valvuloplasty including artificial Gore-tex neochord placement x 22, closure of cleft in posterior leaflet, and 34 mm Edwards Physio II ring annuloplasty  via right mini thoracotomy approach  . Skin cancer of arm    Squamous   Past Surgical History:  Procedure Laterality Date  . ABDOMINAL HYSTERECTOMY  1980   WITH BSO/FIBROIDS  . APPENDECTOMY  1969  . CARDIAC CATHETERIZATION N/A 10/21/2015   Procedure: Right/Left Heart Cath and Coronary Angiography;  Surgeon: Larey Dresser, MD;  Location: [facility];  Service: Cardiovascular;  Laterality: N/A;  . CLIPPING OF ATRIAL APPENDAGE N/A 11/19/2015   Procedure: CLIPPING OF ATRIAL APPENDAGE;  Surgeon: Rexene Alberts, MD;  Location: [facility];  Service: Open Heart Surgery;  Laterality: N/A;  . COLONOSCOPY    . DIAGNOSTIC LAPAROSCOPY     expl  . EXCISION MELANOMA WITH SENTINEL LYMPH NODE BIOPSY Right 10/02/2013   Procedure: WIDE EXCISION MELANOMA RIGHT ARM WITH SENTINEL LYMPH NODE BIOPSY;  Surgeon: Shann Medal, MD;  Location: [facility];  Service: General;  Laterality: Right;  . KNEE BURSECTOMY Right 03/05/2014   Procedure: RIGHT KNEE BURSECTOMY;  Surgeon: Gearlean Alf, MD;  Location[facility];  Service: Orthopedics;  Laterality: Right;  . MINIMALLY INVASIVE MAZE PROCEDURE N/A 11/19/2015   Procedure: MINIMALLY INVASIVE MAZE PROCEDURE;  Surgeon: Rexene Alberts, MD;  Location: [facility];  Service: Open Heart Surgery;  Laterality: N/A;  . MITRAL VALVE REPAIR Right 11/19/2015   Procedure: MINIMALLY INVASIVE MITRAL VALVE REPAIR (MVR);  Surgeon: Rexene Alberts, MD;  Location: [facility];  Service: Open Heart Surgery;  Laterality: Right;  . OOPHORECTOMY     BSO WITH ABDOMINAL HYSTERECTOMY  . SHOULDER  ARTHROSCOPY  2010   right  . TEE WITHOUT CARDIOVERSION N/A 10/21/2015   Procedure: TRANSESOPHAGEAL ECHOCARDIOGRAM (TEE);  Surgeon: Larey Dresser, MD;  Location: [facility];  Service: Cardiovascular;  Laterality: N/A;  . TEE WITHOUT CARDIOVERSION N/A 11/19/2015   Procedure: TRANSESOPHAGEAL ECHOCARDIOGRAM (TEE);  Surgeon: Rexene Alberts, MD;  Location: [facility];  Service: Open Heart Surgery;   Laterality: N/A;  . THYROID CYST EXCISION  05-01-11   RIGHT  . TONSILLECTOMY  1980    reports that she quit smoking about 46 years ago. Her smoking use included Cigarettes. She has a 20.00 pack-year smoking history. She has never used smokeless tobacco. She reports that she drinks about 4.2 oz of alcohol per week . She reports that she does not use drugs. family history includes Alcohol abuse in her father; Breast cancer in her maternal grandmother; Diabetes type II in her mother; Ovarian cancer in her maternal aunt; Throat cancer in her father. Allergies  Allergen Reactions  . Inderal [Propranolol Hcl] Itching    SEVERE LIP(INSIDE ITCH) AND SKIN DRYNESS  . Latex Itching    Sensitive to latex  . Tape Other (See Comments)    Very sensitive skin-     Review of Systems  Constitutional: Negative for appetite change, chills, fever and unexpected weight change.  Respiratory: Negative for shortness of breath.   Cardiovascular: Negative for chest pain.  Gastrointestinal: Positive for abdominal pain. Negative for blood in stool, nausea and vomiting.  Genitourinary: Negative for dysuria.       Objective:   Physical Exam  Constitutional: She appears well-developed and well-nourished. No distress.  Cardiovascular: Normal rate and regular rhythm.   Pulmonary/Chest: Breath sounds normal. No respiratory distress. She has no wheezes.  Abdominal: Soft. Bowel sounds are normal. She exhibits no distension and no mass. There is no tenderness. There is no rebound and no guarding.       Assessment:     Patient presents with bilateral lower abdominal cramping for the past few weeks. She has history of known IBS. Nonfocal exam.    Plan:     -Continue high-fiber diet and plenty of water and minimize caffeine intake -Consider short-term trial of dicyclomine 10 mg 4 times a day when necessary -Touch base of symptoms not improved with the above over the next few days -we also discussed getting set  up soon with GI to consider repeat colonoscopy screening.  She would need to bridged with Lovenox for that.  [name] MD [facility] at [facility]

## 2016-09-24 ENCOUNTER — Ambulatory Visit (INDEPENDENT_AMBULATORY_CARE_PROVIDER_SITE_OTHER): Payer: Medicare Other | Admitting: *Deleted

## 2016-09-24 DIAGNOSIS — Z9889 Other specified postprocedural states: Secondary | ICD-10-CM | POA: Diagnosis not present

## 2016-09-24 DIAGNOSIS — I480 Paroxysmal atrial fibrillation: Secondary | ICD-10-CM | POA: Diagnosis not present

## 2016-09-24 DIAGNOSIS — Z8679 Personal history of other diseases of the circulatory system: Secondary | ICD-10-CM

## 2016-09-24 DIAGNOSIS — Z5181 Encounter for therapeutic drug level monitoring: Secondary | ICD-10-CM | POA: Diagnosis not present

## 2016-09-24 LAB — POCT INR: INR: 2.6

## 2016-09-28 ENCOUNTER — Ambulatory Visit (INDEPENDENT_AMBULATORY_CARE_PROVIDER_SITE_OTHER): Payer: Medicare Other | Admitting: Family Medicine

## 2016-09-28 VITALS — BP 110/80 | HR 82 | Temp 97.9°F | Wt 144.9 lb

## 2016-09-28 DIAGNOSIS — R109 Unspecified abdominal pain: Secondary | ICD-10-CM | POA: Diagnosis not present

## 2016-09-28 MED ORDER — DICYCLOMINE HCL 20 MG PO TABS: 20.0000 mg | ORAL_TABLET | Freq: Four times a day (QID) | ORAL | 3 refills | Status: DC | PRN

## 2016-09-28 NOTE — Progress Notes (Signed)
Subjective:     Patient ID: [name], female   DOB: June 02, 1946, 71 y.o.   MRN: [id number]  HPI Patient seen with some persistent bilateral lower abdominal cramping. Refer to recent note. History of IBS. We started dicyclomine 10 mg 4 times a day which has helped a fair amount but not totally. She still has some constipation issues. She's had some mild perianal irritation but no bleeding. No vomiting. No distention. No dysuria. Continues to have bilateral abdominal symptoms. No clear triggers.  No recent appetite or weight changes.  Past Medical History:  Diagnosis Date  . Heart murmur   . History of melanoma excision   . History of palpitations   . INSOMNIA, CHRONIC 03/29/2009  . Mitral regurgitation    severe by TEE  . MVP (mitral valve prolapse)   . Osteopenia 07/2016   T score -2.1 FRAX 11%/2.1%  . Paroxysmal atrial fibrillation ([place]) 10/18/2014  . Peripheral vascular disease ([place]) 10/16   RT LEG dvt  . Pneumonia 11/17   hx  . PONV (postoperative nausea and vomiting)   . Prepatellar bursitis    RT KNEE  . S/P minimally invasive maze operation for atrial fibrillation 11/19/2015   Complete bilateral atrial lesion set using cryothermy and bipolar radiofrequency ablation with clipping of LA appendage via right mini thoracotomy approch  . S/P minimally invasive mitral valve repair 11/19/2015   Complex valvuloplasty including artificial Gore-tex neochord placement x 22, closure of cleft in posterior leaflet, and 34 mm Edwards Physio II ring annuloplasty via right mini thoracotomy approach  . Skin cancer of arm    Squamous   Past Surgical History:  Procedure Laterality Date  . ABDOMINAL HYSTERECTOMY  1980   WITH BSO/FIBROIDS  . APPENDECTOMY  1969  . CARDIAC CATHETERIZATION N/A 10/21/2015   Procedure: Right/Left Heart Cath and Coronary Angiography;  Surgeon: Larey Dresser, MD;  Location: [facility];  Service: Cardiovascular;  Laterality: N/A;  . CLIPPING OF ATRIAL APPENDAGE  N/A 11/19/2015   Procedure: CLIPPING OF ATRIAL APPENDAGE;  Surgeon: Rexene Alberts, MD;  Location: [place];  Service: Open Heart Surgery;  Laterality: N/A;  . COLONOSCOPY    . DIAGNOSTIC LAPAROSCOPY     expl  . EXCISION MELANOMA WITH SENTINEL LYMPH NODE BIOPSY Right 10/02/2013   Procedure: WIDE EXCISION MELANOMA RIGHT ARM WITH SENTINEL LYMPH NODE BIOPSY;  Surgeon: Shann Medal, MD;  Location: [facility];  Service: General;  Laterality: Right;  . KNEE BURSECTOMY Right 03/05/2014   Procedure: RIGHT KNEE BURSECTOMY;  Surgeon: Gearlean Alf, MD;  [facility];  Service: Orthopedics;  Laterality: Right;  . MINIMALLY INVASIVE MAZE PROCEDURE N/A 11/19/2015   Procedure: MINIMALLY INVASIVE MAZE PROCEDURE;  Surgeon: Rexene Alberts, MD;  Location: [facility];  Service: Open Heart Surgery;  Laterality: N/A;  . MITRAL VALVE REPAIR Right 11/19/2015   Procedure: MINIMALLY INVASIVE MITRAL VALVE REPAIR (MVR);  Surgeon: Rexene Alberts, MD;  Location: [facility];  Service: Open Heart Surgery;  Laterality: Right;  . OOPHORECTOMY     BSO WITH ABDOMINAL HYSTERECTOMY  . SHOULDER ARTHROSCOPY  2010   right  . TEE WITHOUT CARDIOVERSION N/A 10/21/2015   Procedure: TRANSESOPHAGEAL ECHOCARDIOGRAM (TEE);  Surgeon: Larey Dresser, MD;  Location: [facility];  Service: Cardiovascular;  Laterality: N/A;  . TEE WITHOUT CARDIOVERSION N/A 11/19/2015   Procedure: TRANSESOPHAGEAL ECHOCARDIOGRAM (TEE);  Surgeon: Rexene Alberts, MD;  Location: [facility];  Service: Open Heart Surgery;  Laterality: N/A;  .  THYROID CYST EXCISION  05-01-11   RIGHT  . TONSILLECTOMY  1980    reports that she quit smoking about 46 years ago. Her smoking use included Cigarettes. She has a 20.00 pack-year smoking history. She has never used smokeless tobacco. She reports that she drinks about 4.2 oz of alcohol per week . She reports that she does not use drugs. family history includes Alcohol abuse in her father; Breast cancer in her maternal  grandmother; Diabetes type II in her mother; Ovarian cancer in her maternal aunt; Throat cancer in her father. Allergies  Allergen Reactions  . Inderal [Propranolol Hcl] Itching    SEVERE LIP(INSIDE ITCH) AND SKIN DRYNESS  . Latex Itching    Sensitive to latex  . Tape Other (See Comments)    Very sensitive skin-     Review of Systems  Constitutional: Negative for chills and fever.  Respiratory: Negative for shortness of breath.   Cardiovascular: Negative for chest pain.  Gastrointestinal: Positive for abdominal pain and constipation. Negative for diarrhea, nausea and vomiting.  Genitourinary: Negative for dysuria.       Objective:   Physical Exam  Constitutional: She appears well-developed and well-nourished.  Cardiovascular: Normal rate and regular rhythm.   Pulmonary/Chest: Effort normal and breath sounds normal. No respiratory distress. She has no wheezes. She has no rales.  Abdominal: Soft. Bowel sounds are normal. She exhibits no distension and no mass. There is no tenderness. There is no rebound and no guarding.  Genitourinary:  Genitourinary Comments: No external skin tags. No visible anal fissure.       Assessment:     #1 intermittent bilateral abdominal cramping. Suspect IBS related. Benign exam. She has had some relief with low-dose dicyclomine     Plan:     -Recommended consideration for fiber supplement such as FiberCon, Citrucel, or Metamucil -Increased dicyclomine to 20 mg 4 times a day when necessary -Follow-up immediately for any fever, bloody stools, or other new symptoms -Recommend GI evaluation if symptoms not further improved within the next 1-2 weeks  [name] MD [facility] at [facility]

## 2016-09-28 NOTE — Progress Notes (Signed)
Pre visit review using our clinic review tool, if applicable. No additional management support is needed unless otherwise documented below in the visit note. 

## 2016-09-28 NOTE — Patient Instructions (Signed)
Consider fiber supplement such as Citrucel, Metamucil, or FiberCon Continue to drink plenty of fluids Follow-up immediately for any fever, progressive abdominal pain, or any other new symptoms.

## 2016-10-02 ENCOUNTER — Encounter: Payer: Self-pay | Admitting: Cardiology

## 2016-10-13 ENCOUNTER — Encounter: Payer: Self-pay | Admitting: Family Medicine

## 2016-10-13 ENCOUNTER — Ambulatory Visit (INDEPENDENT_AMBULATORY_CARE_PROVIDER_SITE_OTHER): Payer: Medicare Other | Admitting: Family Medicine

## 2016-10-13 VITALS — BP 120/82 | HR 90 | Temp 97.5°F | Wt 146.5 lb

## 2016-10-13 DIAGNOSIS — R198 Other specified symptoms and signs involving the digestive system and abdomen: Secondary | ICD-10-CM

## 2016-10-13 DIAGNOSIS — R1084 Generalized abdominal pain: Secondary | ICD-10-CM

## 2016-10-13 NOTE — Progress Notes (Signed)
Subjective:     Patient ID: [name], female   DOB: December 26, 1945, 71 y.o.   MRN: [id number]  HPI Patient seen with some persistent lower abdominal cramp-like sensations intermittently and intermittent constipation and diarrhea. History of reported IBS. She tried some FiberCon since last visit but states that she felt like she had a "brick" in her stomach. She still exercising regularly. Her weight is up 2 pounds from last visit. No bloody stools. Last colonoscopy 2009 when she lived elsewhere. No recent nausea or vomiting. She is on chronic Coumadin secondary to mitral valve replacement  She's gotten some benefit from dicyclomine 10 mg and we had increased this to 20 mg and she had increased sedation.  Past Medical History:  Diagnosis Date  . Heart murmur   . History of melanoma excision   . History of palpitations   . INSOMNIA, CHRONIC 03/29/2009  . Mitral regurgitation    severe by TEE  . MVP (mitral valve prolapse)   . Osteopenia 07/2016   T score -2.1 FRAX 11%/2.1%  . Paroxysmal atrial fibrillation ([place]) 10/18/2014  . Peripheral vascular disease ([place]) 10/16   RT LEG dvt  . Pneumonia 11/17   hx  . PONV (postoperative nausea and vomiting)   . Prepatellar bursitis    RT KNEE  . S/P minimally invasive maze operation for atrial fibrillation 11/19/2015   Complete bilateral atrial lesion set using cryothermy and bipolar radiofrequency ablation with clipping of LA appendage via right mini thoracotomy approch  . S/P minimally invasive mitral valve repair 11/19/2015   Complex valvuloplasty including artificial Gore-tex neochord placement x 22, closure of cleft in posterior leaflet, and 34 mm Edwards Physio II ring annuloplasty via right mini thoracotomy approach  . Skin cancer of arm    Squamous   Past Surgical History:  Procedure Laterality Date  . ABDOMINAL HYSTERECTOMY  1980   WITH BSO/FIBROIDS  . APPENDECTOMY  1969  . CARDIAC CATHETERIZATION N/A 10/21/2015   Procedure: Right/Left  Heart Cath and Coronary Angiography;  Surgeon: Larey Dresser, MD;  Location: [facility];  Service: Cardiovascular;  Laterality: N/A;  . CLIPPING OF ATRIAL APPENDAGE N/A 11/19/2015   Procedure: CLIPPING OF ATRIAL APPENDAGE;  Surgeon: Rexene Alberts, MD;  Location: [facility];  Service: Open Heart Surgery;  Laterality: N/A;  . COLONOSCOPY    . DIAGNOSTIC LAPAROSCOPY     expl  . EXCISION MELANOMA WITH SENTINEL LYMPH NODE BIOPSY Right 10/02/2013   Procedure: WIDE EXCISION MELANOMA RIGHT ARM WITH SENTINEL LYMPH NODE BIOPSY;  Surgeon: Shann Medal, MD;  Location: [facility];  Service: General;  Laterality: Right;  . KNEE BURSECTOMY Right 03/05/2014   Procedure: RIGHT KNEE BURSECTOMY;  Surgeon: Gearlean Alf, MD;  [facility];  Service: Orthopedics;  Laterality: Right;  . MINIMALLY INVASIVE MAZE PROCEDURE N/A 11/19/2015   Procedure: MINIMALLY INVASIVE MAZE PROCEDURE;  Surgeon: Rexene Alberts, MD;  Location: [facility];  Service: Open Heart Surgery;  Laterality: N/A;  . MITRAL VALVE REPAIR Right 11/19/2015   Procedure: MINIMALLY INVASIVE MITRAL VALVE REPAIR (MVR);  Surgeon: Rexene Alberts, MD;  Location: [facility];  Service: Open Heart Surgery;  Laterality: Right;  . OOPHORECTOMY     BSO WITH ABDOMINAL HYSTERECTOMY  . SHOULDER ARTHROSCOPY  2010   right  . TEE WITHOUT CARDIOVERSION N/A 10/21/2015   Procedure: TRANSESOPHAGEAL ECHOCARDIOGRAM (TEE);  Surgeon: Larey Dresser, MD;  Location: [facility];  Service: Cardiovascular;  Laterality: N/A;  . TEE  WITHOUT CARDIOVERSION N/A 11/19/2015   Procedure: TRANSESOPHAGEAL ECHOCARDIOGRAM (TEE);  Surgeon: Rexene Alberts, MD;  Location: [place];  Service: Open Heart Surgery;  Laterality: N/A;  . THYROID CYST EXCISION  05-01-11   RIGHT  . TONSILLECTOMY  1980    reports that she quit smoking about 46 years ago. Her smoking use included Cigarettes. She has a 20.00 pack-year smoking history. She has never used smokeless tobacco. She reports that  she drinks about 4.2 oz of alcohol per week . She reports that she does not use drugs. family history includes Alcohol abuse in her father; Breast cancer in her maternal grandmother; Diabetes type II in her mother; Ovarian cancer in her maternal aunt; Throat cancer in her father. Allergies  Allergen Reactions  . Inderal [Propranolol Hcl] Itching    SEVERE LIP(INSIDE ITCH) AND SKIN DRYNESS  . Latex Itching    Sensitive to latex  . Tape Other (See Comments)    Very sensitive skin-     Review of Systems  Constitutional: Negative for appetite change, chills, fever and unexpected weight change.  Cardiovascular: Negative for chest pain.  Gastrointestinal: Positive for abdominal pain, constipation and diarrhea. Negative for nausea and vomiting.       Objective:   Physical Exam  Constitutional: She appears well-developed and well-nourished.  Cardiovascular: Normal rate.   Pulmonary/Chest: Effort normal and breath sounds normal. No respiratory distress. She has no wheezes. She has no rales.  Abdominal: Soft. Bowel sounds are normal. She exhibits no distension and no mass. There is no tenderness. There is no rebound and no guarding.       Assessment:     Persistent symptoms of bilateral lower abdominal cramping along with intermittent constipation and diarrhea. Suspect IBS. Last colonoscopy 2009.    Plan:     -Set up GI referral. -continue with fiber supplement. -continue Dicyclomine as needed.  [name] MD [facility] at [facility]

## 2016-10-13 NOTE — Patient Instructions (Addendum)
WE NOW OFFER   [facility] FAST TRACK!!!  SAME DAY Appointments for ACUTE CARE  Such as: Sprains, Injuries, cuts, abrasions, rashes, muscle pain, joint pain, back pain Colds, flu, sore throats, headache, allergies, cough, fever  Ear pain, sinus and eye infections Abdominal pain, nausea, vomiting, diarrhea, upset stomach Animal/insect bites  3 Easy Ways to Schedule: Walk-In Scheduling Call in scheduling Mychart Sign-up: [email or web address]  We will set up GI referral.

## 2016-10-13 NOTE — Progress Notes (Signed)
Pre visit review using our clinic review tool, if applicable. No additional management support is needed unless otherwise documented below in the visit note. 

## 2016-10-14 ENCOUNTER — Encounter: Payer: Self-pay | Admitting: Gastroenterology

## 2016-10-22 ENCOUNTER — Ambulatory Visit (INDEPENDENT_AMBULATORY_CARE_PROVIDER_SITE_OTHER): Payer: Medicare Other | Admitting: *Deleted

## 2016-10-22 ENCOUNTER — Encounter: Payer: Self-pay | Admitting: Cardiology

## 2016-10-22 ENCOUNTER — Telehealth: Payer: Self-pay | Admitting: Family Medicine

## 2016-10-22 ENCOUNTER — Ambulatory Visit (INDEPENDENT_AMBULATORY_CARE_PROVIDER_SITE_OTHER): Payer: Medicare Other | Admitting: Cardiology

## 2016-10-22 VITALS — BP 114/62 | HR 92 | Ht 69.0 in | Wt 143.0 lb

## 2016-10-22 DIAGNOSIS — Z8679 Personal history of other diseases of the circulatory system: Secondary | ICD-10-CM | POA: Diagnosis not present

## 2016-10-22 DIAGNOSIS — I480 Paroxysmal atrial fibrillation: Secondary | ICD-10-CM | POA: Diagnosis not present

## 2016-10-22 DIAGNOSIS — Z5181 Encounter for therapeutic drug level monitoring: Secondary | ICD-10-CM | POA: Diagnosis not present

## 2016-10-22 DIAGNOSIS — Z7901 Long term (current) use of anticoagulants: Secondary | ICD-10-CM | POA: Diagnosis not present

## 2016-10-22 DIAGNOSIS — Z9889 Other specified postprocedural states: Secondary | ICD-10-CM | POA: Diagnosis not present

## 2016-10-22 DIAGNOSIS — E784 Other hyperlipidemia: Secondary | ICD-10-CM

## 2016-10-22 DIAGNOSIS — I340 Nonrheumatic mitral (valve) insufficiency: Secondary | ICD-10-CM | POA: Diagnosis not present

## 2016-10-22 DIAGNOSIS — E785 Hyperlipidemia, unspecified: Secondary | ICD-10-CM | POA: Insufficient documentation

## 2016-10-22 DIAGNOSIS — E7849 Other hyperlipidemia: Secondary | ICD-10-CM

## 2016-10-22 LAB — POCT INR: INR: 2.2

## 2016-10-22 MED ORDER — ZOSTER VAC RECOMB ADJUVANTED 50 MCG/0.5ML IM SUSR: 0.5000 mL | Freq: Once | INTRAMUSCULAR | 1 refills | Status: AC

## 2016-10-22 NOTE — Telephone Encounter (Signed)
Rx sent via e-scribe.

## 2016-10-22 NOTE — Progress Notes (Signed)
Cardiology Office Note    Date:  10/22/2016   ID:  [name], DOB Oct 27, 1945, MRN [id number]  PCP:  [name], MD  Cardiologist:  Dr Aundra Dubin --> Ena Dawley, MD   Chief complain: Months follow-up.  History of Present Illness:  [name] is a 71 y.o. female with h/o minimally invasive mitral valve repair and Maze procedure on 11/19/2015 for severe symptomatic primary mitral regurgitation and recurrent paroxysmal atrial fibrillation. She was seen 5 months s/p surgery in the a-fib clinic and reported no symptoms that would indicate return of afib. She has been off amiodarone for several months. She has finished cardiac rehab and continues to exercise on her own at a local gym and has returned to playing tennis.She feels that she is much improved from surgery but still notices some numbness and mild discomfort at the incision site and an occassional cramp in her rt hand. She continues on warfarin.  08/11/16 - the patient states that she has been feeling great. She is able to go to gym and walks 3 miles a day. She hasn't been playing much tenderness as she has hamstring injury. Otherwise she denies presyncope or syncope no lower extremity edema no orthopnea or proximal nocturnal dyspnea she denies any palpitations. Her only complaint is profound fatigue that she has had in the past but now she feels it's worse.  10/22/2016 - patient is coming after 3 months, she feels great, she goes to gym everyday and placed tennis regularly. She denies any chest pain shortness of breath no palpitations dizziness or syncope. She is scheduled for colonoscopy and is asking about Coumadin adjustment. She is asking if he could be possible to discontinue her Coumadin in the future as she plays tennis and is post all over her body.  Past Medical History:  Diagnosis Date  . Heart murmur   . History of melanoma excision   . History of palpitations   . INSOMNIA, CHRONIC 03/29/2009  . Mitral regurgitation      severe by TEE  . MVP (mitral valve prolapse)   . Osteopenia 07/2016   T score -2.1 FRAX 11%/2.1%  . Paroxysmal atrial fibrillation ([place]) 10/18/2014  . Peripheral vascular disease ([place]) 10/16   RT LEG dvt  . Pneumonia 11/17   hx  . PONV (postoperative nausea and vomiting)   . Prepatellar bursitis    RT KNEE  . S/P minimally invasive maze operation for atrial fibrillation 11/19/2015   Complete bilateral atrial lesion set using cryothermy and bipolar radiofrequency ablation with clipping of LA appendage via right mini thoracotomy approch  . S/P minimally invasive mitral valve repair 11/19/2015   Complex valvuloplasty including artificial Gore-tex neochord placement x 22, closure of cleft in posterior leaflet, and 34 mm Edwards Physio II ring annuloplasty via right mini thoracotomy approach  . Skin cancer of arm    Squamous    Past Surgical History:  Procedure Laterality Date  . ABDOMINAL HYSTERECTOMY  1980   WITH BSO/FIBROIDS  . APPENDECTOMY  1969  . CARDIAC CATHETERIZATION N/A 10/21/2015   Procedure: Right/Left Heart Cath and Coronary Angiography;  Surgeon: Larey Dresser, MD;  Location: [facility];  Service: Cardiovascular;  Laterality: N/A;  . CLIPPING OF ATRIAL APPENDAGE N/A 11/19/2015   Procedure: CLIPPING OF ATRIAL APPENDAGE;  Surgeon: Rexene Alberts, MD;  Location: [place] [facility];  Service: Open Heart Surgery;  Laterality: N/A;  . COLONOSCOPY    . DIAGNOSTIC LAPAROSCOPY     expl  .  EXCISION MELANOMA WITH SENTINEL LYMPH NODE BIOPSY Right 10/02/2013   Procedure: WIDE EXCISION MELANOMA RIGHT ARM WITH SENTINEL LYMPH NODE BIOPSY;  Surgeon: Shann Medal, MD;  Location: [facility];  Service: General;  Laterality: Right;  . KNEE BURSECTOMY Right 03/05/2014   Procedure: RIGHT KNEE BURSECTOMY;  Surgeon: Gearlean Alf, MD;  [facility];  Service: Orthopedics;  Laterality: Right;  . MINIMALLY INVASIVE MAZE PROCEDURE N/A 11/19/2015   Procedure: MINIMALLY INVASIVE MAZE  PROCEDURE;  Surgeon: Rexene Alberts, MD;  Location: [employer] [place];  Service: Open Heart Surgery;  Laterality: N/A;  . MITRAL VALVE REPAIR Right 11/19/2015   Procedure: MINIMALLY INVASIVE MITRAL VALVE REPAIR (MVR);  Surgeon: Rexene Alberts, MD;  Location: [facility];  Service: Open Heart Surgery;  Laterality: Right;  . OOPHORECTOMY     BSO WITH ABDOMINAL HYSTERECTOMY  . SHOULDER ARTHROSCOPY  2010   right  . TEE WITHOUT CARDIOVERSION N/A 10/21/2015   Procedure: TRANSESOPHAGEAL ECHOCARDIOGRAM (TEE);  Surgeon: Larey Dresser, MD;  Location: [facility];  Service: Cardiovascular;  Laterality: N/A;  . TEE WITHOUT CARDIOVERSION N/A 11/19/2015   Procedure: TRANSESOPHAGEAL ECHOCARDIOGRAM (TEE);  Surgeon: Rexene Alberts, MD;  Location: [facility];  Service: Open Heart Surgery;  Laterality: N/A;  . THYROID CYST EXCISION  05-01-11   RIGHT  . TONSILLECTOMY  1980    Current Medications: Outpatient Medications Prior to Visit  Medication Sig Dispense Refill  . atorvastatin (LIPITOR) 20 MG tablet Take 1 tablet (20 mg total) by mouth daily. 90 tablet 3  . desonide (DESOWEN) 0.05 % cream Apply 1 application topically daily as needed. For itching per patient    . dicyclomine (BENTYL) 20 MG tablet Take 1 tablet (20 mg total) by mouth every 6 (six) hours as needed for spasms. 60 tablet 3  . ibuprofen (ADVIL,MOTRIN) 200 MG tablet Take 200 mg by mouth every 6 (six) hours as needed for moderate pain.    . metoprolol succinate (TOPROL-XL) 25 MG 24 hr tablet Take 25 mg by mouth daily. Take at bedtime    . mometasone (NASONEX) 50 MCG/ACT nasal spray Place 2 sprays into the nose daily. 17 g 1  . nystatin (MYCOSTATIN/NYSTOP) 100000 UNIT/GM POWD Apply 1 application topically daily as needed (skin).   2  . warfarin (COUMADIN) 4 MG tablet Take 0.5-1 tablets (2-4 mg total) by mouth daily. Take 4mg  every day except on sundays take 2mg  per patient 30 tablet 2  . zolpidem (AMBIEN) 10 MG tablet TAKE 1 TABLET BY MOUTH EVERY NIGHT AT BEDTIME AS  NEEDED FOR SLEEP 30 tablet 2   No facility-administered medications prior to visit.      Allergies:   Inderal [propranolol hcl]; Latex; and Tape   Social History   Social History  . Marital status: Married    Spouse name: N/A  . Number of children: N/A  . Years of education: N/A   Occupational History  . retired Unemployed   Social History Main Topics  . Smoking status: Former Smoker    Packs/day: 1.00    Years: 20.00    Types: Cigarettes    Quit date: 08/11/1970  . Smokeless tobacco: Never Used  . Alcohol use 4.2 oz/week    7 Glasses of wine per week  . Drug use: No  . Sexual activity: Yes    Birth control/ protection: Surgical     Comment: intercourse age 21, sexual partners less than 5   Other Topics Concern  . None  Social History Narrative  . None     Family History:  The patient's family history includes Alcohol abuse in her father; Breast cancer in her maternal grandmother; Diabetes type II in her mother; Ovarian cancer in her maternal aunt; Throat cancer in her father.   ROS:   Please see the history of present illness.    ROS All other systems reviewed and are negative.   PHYSICAL EXAM:   VS:  BP 114/62   Pulse 92   Ht 5\' 9"  (1.753 m)   Wt 143 lb (64.9 kg)   LMP 06/22/1977   SpO2 99%   BMI 21.12 kg/m    GEN: Well nourished, well developed, in no acute distress  HEENT: normal  Neck: no JVD, carotid bruits, or masses Cardiac: RRR; no murmurs, rubs, or gallops,no edema  Respiratory:  clear to auscultation bilaterally, normal work of breathing GI: soft, nontender, nondistended, + BS MS: no deformity or atrophy  Skin: warm and dry, no rash Neuro:  Alert and Oriented x 3, Strength and sensation are intact Psych: euthymic mood, full affect  Wt Readings from Last 3 Encounters:  10/22/16 143 lb (64.9 kg)  10/13/16 146 lb 8 oz (66.5 kg)  09/28/16 144 lb 14.4 oz (65.7 kg)      Studies/Labs Reviewed:   EKG:  EKG is not ordered today.    Recent  Labs: 11/20/2015: Magnesium 2.3 12/17/2015: TSH 2.45 02/20/2016: ALT 27; BUN 17; Creatinine, Ser 0.92; Hemoglobin 14.7; Platelets 302; Potassium 4.3; Sodium 140   Lipid Panel    Component Value Date/Time   CHOL 206 (H) 10/19/2013 0952   TRIG 66.0 10/19/2013 0952   HDL 92.70 10/19/2013 0952   CHOLHDL 2 10/19/2013 0952   VLDL 13.2 10/19/2013 0952   LDLCALC 100 (H) 10/19/2013 0952   LDLDIRECT 79.2 11/11/2009 1039    Additional studies/ records that were reviewed today include:  TTE: 7/17 Left ventricle: The cavity size was normal. Systolic function was   normal. The estimated ejection fraction was in the range of 55%   to 60%. Wall motion was normal; there were no regional wall   motion abnormalities. The study is not technically sufficient to   allow evaluation of LV diastolic function. - Aortic valve: There was trivial regurgitation. - Left atrium: The atrium was mildly dilated. - Right ventricle: The cavity size was mildly dilated. Wall   thickness was normal. - Right atrium: The atrium was moderately dilated. - Tricuspid valve: There was moderate regurgitation. - Pulmonary arteries: Systolic pressure was within the normal   range. PA peak pressure: 29 mm Hg (S). - Inferior vena cava: The vessel was normal in size. - Pericardium, extracardiac: A trivial pericardial effusion was   identified.  Impressions:  - When compared to the prior study from 10/21/2015 mitral valve is   post repair. There is residual regurgitation and normal   transmitral gradients.  LHC: 10/2015  1. Normal left and right heart filling pressures.  2. Nonobstructive CAD.  3. Normal EF, mitral regurgitation does appear as marked by LV-gram as by TEE, likely due to eccentricity.     ASSESSMENT:    1. S/P minimally invasive mitral valve repair + maze procedure   2. S/P minimally invasive maze operation for atrial fibrillation   3. Other hyperlipidemia   4. Paroxysmal atrial fibrillation (HCC)   5.  Non-rheumatic mitral regurgitation   6. Chronic anticoagulation      PLAN:  In order of problems listed above:  1.  Status post mitral valve repair with grade results normal transmitral gradient post surgery. The patient is fully active and not limited by shortness of breath or chest pain. 2. History of paroxysmal A. fib status post maze procedure with no recurrent CO for A. fib. In the past A. fib only symptomatic and she had no episodes in the last year. I have discussed this with Dr. Caryl Comes and he suggested to place a LINQ monitor and discontinue warfarin after certain period without A. Fib. 3. Colonoscopy - she will call us with exact date 1 scheduled in our Coumadin clinic will adjust Coumadin dosage prior to the colonoscopy. 4. Hyperlipidemia lipids not checked in several years we'll check to gather with CPAP such and CBC.    Medication Adjustments/Labs and Tests Ordered: Current medicines are reviewed at length with the patient today.  Concerns regarding medicines are outlined above.  Medication changes, Labs and Tests ordered today are listed in the Patient Instructions below. Patient Instructions  Medication Instructions:   Your physician recommends that you continue on your current medications as directed. Please refer to the Current Medication list given to you today.    Labwork:  TOMORROW 10/23/16 TO CHECK----CMET, CBC W DIFF, TSH, AND LIPIDS---PLEASE COME FASTING TO THIS LAB APPOINTMENT      Follow-Up:  Your physician wants you to follow-up in: [place] will receive a reminder letter in the mail two months in advance. If you don't receive a letter, please call our office to schedule the follow-up appointment.     NEW PATIENT APPOINTMENT WITH DR [place]am [place] FOR LINQ        If you need a refill on your cardiac medications before your next appointment, please call your pharmacy.      Signed, Ena Dawley, MD  10/22/2016 2:11  PM    [facility] [address], [place], [place]  [address] Phone: [phone]; Fax: [phone]

## 2016-10-22 NOTE — Telephone Encounter (Signed)
Pt would like rx shinglorix vaccine fax to [email or web address]

## 2016-10-22 NOTE — Patient Instructions (Addendum)
Medication Instructions:   Your physician recommends that you continue on your current medications as directed. Please refer to the Current Medication list given to you today.    Labwork:  TOMORROW 10/23/16 TO CHECK----CMET, CBC W DIFF, TSH, AND LIPIDS---PLEASE COME FASTING TO THIS LAB APPOINTMENT      Follow-Up:  Your physician wants you to follow-up in: [place] will receive a reminder letter in the mail two months in advance. If you don't receive a letter, please call our office to schedule the follow-up appointment.     NEW PATIENT APPOINTMENT WITH DR [place] FOR LINQ        If you need a refill on your cardiac medications before your next appointment, please call your pharmacy.

## 2016-10-23 ENCOUNTER — Other Ambulatory Visit: Payer: Medicare Other | Admitting: *Deleted

## 2016-10-23 ENCOUNTER — Telehealth: Payer: Self-pay | Admitting: *Deleted

## 2016-10-23 DIAGNOSIS — Z9889 Other specified postprocedural states: Secondary | ICD-10-CM | POA: Diagnosis not present

## 2016-10-23 DIAGNOSIS — I480 Paroxysmal atrial fibrillation: Secondary | ICD-10-CM

## 2016-10-23 DIAGNOSIS — E784 Other hyperlipidemia: Secondary | ICD-10-CM | POA: Diagnosis not present

## 2016-10-23 DIAGNOSIS — E7849 Other hyperlipidemia: Secondary | ICD-10-CM

## 2016-10-23 DIAGNOSIS — Z8679 Personal history of other diseases of the circulatory system: Secondary | ICD-10-CM | POA: Diagnosis not present

## 2016-10-23 LAB — COMPREHENSIVE METABOLIC PANEL
ALT: 22 IU/L (ref 0–32)
AST: 31 IU/L (ref 0–40)
Albumin/Globulin Ratio: 1.7 (ref 1.2–2.2)
Albumin: 4.2 g/dL (ref 3.6–4.8)
Alkaline Phosphatase: 80 IU/L (ref 39–117)
BUN/Creatinine Ratio: 20 (ref 12–28)
BUN: 18 mg/dL (ref 8–27)
Bilirubin Total: 0.3 mg/dL (ref 0.0–1.2)
CO2: 21 mmol/L (ref 18–29)
Calcium: 9.5 mg/dL (ref 8.7–10.3)
Chloride: 103 mmol/L (ref 96–106)
Creatinine, Ser: 0.91 mg/dL (ref 0.57–1.00)
GFR calc Af Amer: 75 mL/min/{1.73_m2} (ref >59)
GFR calc non Af Amer: 65 mL/min/{1.73_m2} (ref >59)
Globulin, Total: 2.5 g/dL (ref 1.5–4.5)
Glucose: 86 mg/dL (ref 65–99)
Potassium: 4.9 mmol/L (ref 3.5–5.2)
Sodium: 143 mmol/L (ref 134–144)
Total Protein: 6.7 g/dL (ref 6.0–8.5)

## 2016-10-23 LAB — CBC WITH DIFFERENTIAL/PLATELET
Basophils Absolute: 0.1 10*3/uL (ref 0.0–0.2)
Basos: 2 %
EOS (ABSOLUTE): 0.2 10*3/uL (ref 0.0–0.4)
Eos: 7 %
Hematocrit: 40.7 % (ref 34.0–46.6)
Hemoglobin: 13.9 g/dL (ref 11.1–15.9)
Immature Grans (Abs): 0 10*3/uL (ref 0.0–0.1)
Immature Granulocytes: 0 %
Lymphocytes Absolute: 1.6 10*3/uL (ref 0.7–3.1)
Lymphs: 46 %
MCH: 33.5 pg — ABNORMAL HIGH (ref 26.6–33.0)
MCHC: 34.2 g/dL (ref 31.5–35.7)
MCV: 98 fL — ABNORMAL HIGH (ref 79–97)
Monocytes Absolute: 0.5 10*3/uL (ref 0.1–0.9)
Monocytes: 13 %
Neutrophils Absolute: 1.1 10*3/uL — ABNORMAL LOW (ref 1.4–7.0)
Neutrophils: 32 %
Platelets: 272 10*3/uL (ref 150–379)
RBC: 4.15 x10E6/uL (ref 3.77–5.28)
RDW: 13.6 % (ref 12.3–15.4)
WBC: 3.4 10*3/uL (ref 3.4–10.8)

## 2016-10-23 LAB — LIPID PANEL
Chol/HDL Ratio: 1.8 ratio (ref 0.0–4.4)
Cholesterol, Total: 165 mg/dL (ref 100–199)
HDL: 91 mg/dL (ref >39)
LDL Calculated: 55 mg/dL (ref 0–99)
Triglycerides: 97 mg/dL (ref 0–149)
VLDL Cholesterol Cal: 19 mg/dL (ref 5–40)

## 2016-10-23 LAB — TSH: TSH: 2.63 u[IU]/mL (ref 0.450–4.500)

## 2016-10-23 NOTE — Telephone Encounter (Signed)
-----   Message from Dorothy Spark, MD sent at 10/23/2016  2:45 PM EDT ----- Normal CBC, CMP, significantly improved LDL.

## 2016-10-23 NOTE — Telephone Encounter (Signed)
LEFT MESSAGE TO CALL BACK - TO GIVE LAB RESULTS

## 2016-10-23 NOTE — Telephone Encounter (Signed)
Spoke to patient. Result given . Verbalized understanding  

## 2016-10-27 DIAGNOSIS — L570 Actinic keratosis: Secondary | ICD-10-CM | POA: Diagnosis not present

## 2016-10-27 DIAGNOSIS — L821 Other seborrheic keratosis: Secondary | ICD-10-CM | POA: Diagnosis not present

## 2016-10-30 ENCOUNTER — Other Ambulatory Visit: Payer: Self-pay | Admitting: Cardiology

## 2016-11-10 ENCOUNTER — Telehealth: Payer: Self-pay | Admitting: Cardiology

## 2016-11-10 NOTE — Telephone Encounter (Signed)
New message    Pt is calling asking for a call back from someone in the coumadin clinic.

## 2016-11-10 NOTE — Telephone Encounter (Signed)
Pt sees Dr Caryl Comes at 945, thus moved the 845 appt closer to his.

## 2016-11-12 ENCOUNTER — Encounter: Payer: Self-pay | Admitting: Gastroenterology

## 2016-11-12 ENCOUNTER — Ambulatory Visit (INDEPENDENT_AMBULATORY_CARE_PROVIDER_SITE_OTHER): Payer: Medicare Other | Admitting: Gastroenterology

## 2016-11-12 ENCOUNTER — Encounter (INDEPENDENT_AMBULATORY_CARE_PROVIDER_SITE_OTHER): Payer: Self-pay

## 2016-11-12 VITALS — BP 116/70 | HR 80 | Ht 67.0 in | Wt 142.2 lb

## 2016-11-12 DIAGNOSIS — K5909 Other constipation: Secondary | ICD-10-CM | POA: Diagnosis not present

## 2016-11-12 DIAGNOSIS — Z7901 Long term (current) use of anticoagulants: Secondary | ICD-10-CM

## 2016-11-12 DIAGNOSIS — I480 Paroxysmal atrial fibrillation: Secondary | ICD-10-CM | POA: Diagnosis not present

## 2016-11-12 DIAGNOSIS — R1032 Left lower quadrant pain: Secondary | ICD-10-CM | POA: Diagnosis not present

## 2016-11-12 MED ORDER — NA SULFATE-K SULFATE-MG SULF 17.5-3.13-1.6 GM/177ML PO SOLN: 1.0000 | Freq: Once | ORAL | 0 refills | Status: AC

## 2016-11-12 NOTE — Patient Instructions (Signed)
If you are age 69 or older, your body mass index should be between 23-30. Your Body mass index is 22.27 kg/m. If this is out of the aforementioned range listed, please consider follow up with your Primary Care Provider.  If you are age 36 or younger, your body mass index should be between 19-25. Your Body mass index is 22.27 kg/m. If this is out of the aformentioned range listed, please consider follow up with your Primary Care Provider.   You have been scheduled for a colonoscopy. Please follow written instructions given to you at your visit today.  Please pick up your prep supplies at the pharmacy within the next 1-3 days. If you use inhalers (even only as needed), please bring them with you on the day of your procedure. Your physician has requested that you go to www.startemmi.com and enter the access code given to you at your visit today. This web site gives a general overview about your procedure. However, you should still follow specific instructions given to you by our office regarding your preparation for the procedure.  Thank you for choosing [facility]  Dr Wilfrid Lund III

## 2016-11-12 NOTE — Progress Notes (Signed)
[facility] Consult Note:  History: [name] 11/12/2016  Referring physician: [name], MD  Reason for consult/chief complaint: Abdominal Pain (intermittently x 5 weeks; lower abdominal cramping; becomes worse when she gets dehydrated); Constipation (gets diarrhea occasionally but seems to have constipation very often; metamucil and tea helps some; + gassy); and rectal discomfort (has very small amount rectal bleeding on toilet tissue at times; has rectal discomfort at times; soaking in hot bath helps)   Subjective  HPI:  This is a 71 year old woman referred by primary care for recent onset abdominal pain with altered bowel habits and small volume anorectal bleeding. About 6 weeks ago she started having bilateral crampy lower abdominal pain with mostly constipation but occasional diarrhea with urgency. If she had constipation she might get some "streaks of blood" rather than frank rectal bleeding. She was feeling quite bloated and gassy. FiberCon tablets only made her feel worse. She is now on Metamucil with some regulation of symptoms. [name] has been feeling well for about the last week or 10 days. She was diagnosed with IBS many years ago but said it had not bothered her for very long time. There is no recent travel, sick contacts, new medicines or other clear triggers for the recent symptoms. Her last colonoscopy was 9 or 10 years ago in [place]   ROS:  Review of Systems  Constitutional: Positive for fatigue. Negative for appetite change and unexpected weight change.  HENT: Negative for mouth sores and voice change.   Eyes: Negative for pain and redness.  Respiratory: Negative for cough and shortness of breath.   Cardiovascular: Negative for chest pain and palpitations.  Genitourinary: Negative for dysuria and hematuria.  Musculoskeletal: Positive for back pain and myalgias. Negative for arthralgias.  Skin: Negative for pallor and rash.    Neurological: Negative for weakness and headaches.  Hematological: Negative for adenopathy.     Past Medical History: Past Medical History:  Diagnosis Date  . Heart murmur   . History of melanoma excision   . History of palpitations   . IBS (irritable bowel syndrome)   . INSOMNIA, CHRONIC 03/29/2009  . Mitral regurgitation    severe by TEE  . MVP (mitral valve prolapse)   . Osteopenia 07/2016   T score -2.1 FRAX 11%/2.1%  . Paroxysmal atrial fibrillation ([place]) 10/18/2014  . Peripheral vascular disease ([place]) 10/16   RT LEG dvt  . Pneumonia 11/17   hx  . PONV (postoperative nausea and vomiting)   . Prepatellar bursitis    RT KNEE  . S/P minimally invasive maze operation for atrial fibrillation 11/19/2015   Complete bilateral atrial lesion set using cryothermy and bipolar radiofrequency ablation with clipping of LA appendage via right mini thoracotomy approch  . S/P minimally invasive mitral valve repair 11/19/2015   Complex valvuloplasty including artificial Gore-tex neochord placement x 22, closure of cleft in posterior leaflet, and 34 mm Edwards Physio II ring annuloplasty via right mini thoracotomy approach  . Skin cancer of arm    Squamous     Past Surgical History: Past Surgical History:  Procedure Laterality Date  . ABDOMINAL HYSTERECTOMY  1980   WITH BSO/FIBROIDS  . APPENDECTOMY  1969  . CARDIAC CATHETERIZATION N/A 10/21/2015   Procedure: Right/Left Heart Cath and Coronary Angiography;  Surgeon: Larey Dresser, MD;  Location: [facility];  Service: Cardiovascular;  Laterality: N/A;  . CLIPPING OF ATRIAL APPENDAGE N/A 11/19/2015   Procedure: CLIPPING OF ATRIAL APPENDAGE;  Surgeon:  Rexene Alberts, MD;  Location: [facility];  Service: Open Heart Surgery;  Laterality: N/A;  . COLONOSCOPY    . DIAGNOSTIC LAPAROSCOPY     expl  . EXCISION MELANOMA WITH SENTINEL LYMPH NODE BIOPSY Right 10/02/2013   Procedure: WIDE EXCISION MELANOMA RIGHT ARM WITH SENTINEL LYMPH NODE  BIOPSY;  Surgeon: Shann Medal, MD;  Location: [facility];  Service: General;  Laterality: Right;  . KNEE BURSECTOMY Right 03/05/2014   Procedure: RIGHT KNEE BURSECTOMY;  Surgeon: Gearlean Alf, MD;  Location: [facility];  Service: Orthopedics;  Laterality: Right;  . MINIMALLY INVASIVE MAZE PROCEDURE N/A 11/19/2015   Procedure: MINIMALLY INVASIVE MAZE PROCEDURE;  Surgeon: Rexene Alberts, MD;  Location: [facility];  Service: Open Heart Surgery;  Laterality: N/A;  . MITRAL VALVE REPAIR Right 11/19/2015   Procedure: MINIMALLY INVASIVE MITRAL VALVE REPAIR (MVR);  Surgeon: Rexene Alberts, MD;  Location: [facility];  Service: Open Heart Surgery;  Laterality: Right;  . SHOULDER ARTHROSCOPY  2010   right  . TEE WITHOUT CARDIOVERSION N/A 10/21/2015   Procedure: TRANSESOPHAGEAL ECHOCARDIOGRAM (TEE);  Surgeon: Larey Dresser, MD;  Location: [place] [facility];  Service: Cardiovascular;  Laterality: N/A;  . TEE WITHOUT CARDIOVERSION N/A 11/19/2015   Procedure: TRANSESOPHAGEAL ECHOCARDIOGRAM (TEE);  Surgeon: Rexene Alberts, MD;  Location: [facility];  Service: Open Heart Surgery;  Laterality: N/A;  . THYROID CYST EXCISION  05-01-11   RIGHT  . TONSILLECTOMY  1980     Family History: Family History  Problem Relation Age of Onset  . Diabetes type II Mother   . Heart disease Mother        age 37  . Alcohol abuse Father   . Throat cancer Father   . Ovarian cancer Maternal Aunt   . Breast cancer Maternal Grandmother     Social History: Social History   Social History  . Marital status: Married    Spouse name: N/A  . Number of children: N/A  . Years of education: N/A   Occupational History  . retired Unemployed   Social History Main Topics  . Smoking status: Former Smoker    Packs/day: 1.00    Years: 20.00    Types: Cigarettes    Quit date: 08/11/1970  . Smokeless tobacco: Never Used  . Alcohol use 8.4 oz/week    14 Glasses of wine per week  . Drug use: No  . Sexual activity: Yes    Birth  control/ protection: Surgical     Comment: intercourse age 59, sexual partners less than 5   Other Topics Concern  . None   Social History Narrative  . None    Allergies: Allergies  Allergen Reactions  . Inderal [Propranolol Hcl] Itching    SEVERE LIP(INSIDE ITCH) AND SKIN DRYNESS  . Latex Itching    Sensitive to latex  . Tape Other (See Comments)    Very sensitive skin-    Outpatient Meds: Current Outpatient Prescriptions  Medication Sig Dispense Refill  . atorvastatin (LIPITOR) 20 MG tablet Take 1 tablet (20 mg total) by mouth daily. 90 tablet 3  . desonide (DESOWEN) 0.05 % cream Apply 1 application topically daily as needed. For itching per patient    . dicyclomine (BENTYL) 20 MG tablet Take 1 tablet (20 mg total) by mouth every 6 (six) hours as needed for spasms. 60 tablet 3  . ibuprofen (ADVIL,MOTRIN) 200 MG tablet Take 200 mg by mouth every 6 (six) hours as needed for moderate pain.    Marland Kitchen  metoprolol succinate (TOPROL-XL) 25 MG 24 hr tablet Take 25 mg by mouth daily. Take at bedtime    . mometasone (NASONEX) 50 MCG/ACT nasal spray Place 2 sprays into the nose daily. 17 g 1  . nystatin (MYCOSTATIN/NYSTOP) 100000 UNIT/GM POWD Apply 1 application topically daily as needed (skin).   2  . warfarin (COUMADIN) 4 MG tablet Take as directed by Coumadin Clinic 90 tablet 1  . zolpidem (AMBIEN) 10 MG tablet TAKE 1 TABLET BY MOUTH EVERY NIGHT AT BEDTIME AS NEEDED FOR SLEEP 30 tablet 2  . Na Sulfate-K Sulfate-Mg Sulf 17.5-3.13-1.6 GM/180ML SOLN Take 1 kit by mouth once. 354 mL 0   No current facility-administered medications for this visit.       ___________________________________________________________________ Objective   Exam:  BP 116/70   Pulse 80   Ht '5\' 7"'  (1.702 m)   Wt 142 lb 3.2 oz (64.5 kg)   LMP 06/22/1977   BMI 22.27 kg/m    General: this is a(n) Well-appearing woman   Eyes: sclera anicteric, no redness  ENT: oral mucosa moist without lesions, no cervical  or supraclavicular lymphadenopathy, good dentition  CV: RRR without murmur, S1/S2, no JVD, no peripheral edema  Resp: clear to auscultation bilaterally, normal RR and effort noted  GI: soft, no tenderness, with active bowel sounds. No guarding or palpable organomegaly noted.  Skin; warm and dry, no rash or jaundice noted  Neuro: awake, alert and oriented x 3. Normal gross motor function and fluent speech Rectal: Normal external, normal sphincter tone with no fissure or palpable internal lesions or tenderness.  Labs:  CBC Latest Ref Rng & Units 10/23/2016 02/20/2016 11/22/2015  WBC 3.4 - 10.8 x10E3/uL 3.4 6.9 8.8  Hemoglobin 12.0 - 15.0 g/dL - 14.7 9.2(L)  Hematocrit 34.0 - 46.6 % 40.7 45.1 28.5(L)  Platelets 150 - 379 x10E3/uL 272 302 120(L)     Assessment: Encounter Diagnoses  Name Primary?  Marland Kitchen LLQ abdominal pain Yes  . Other constipation   . Paroxysmal atrial fibrillation (HCC)   . Current use of long term anticoagulation     Cause of recent symptoms is unclear, though she seems to be feeling better in the last week to 10 days.  It is reassuring that she is not anemic. The bleeding sounds likely to be benign and related to altered bowel habits.  Plan:  Colonoscopy. She will need to be off Coumadin 4 days prior and probably require bridging Lovenox therapy. We will communicate with her cardiologist Dr. Meda Coffee about that.  I have asked her to consider changing the Metamucil to Citrucel fiber as it is less bloating.  Thank you for the courtesy of this consult.  Please call me with any questions or concerns.  Nelida Meuse III  CC: [name], MD

## 2016-11-13 ENCOUNTER — Other Ambulatory Visit: Payer: Self-pay

## 2016-11-13 ENCOUNTER — Telehealth: Payer: Self-pay | Admitting: Pharmacist

## 2016-11-13 ENCOUNTER — Ambulatory Visit (INDEPENDENT_AMBULATORY_CARE_PROVIDER_SITE_OTHER): Payer: Medicare Other | Admitting: *Deleted

## 2016-11-13 ENCOUNTER — Encounter (INDEPENDENT_AMBULATORY_CARE_PROVIDER_SITE_OTHER): Payer: Self-pay

## 2016-11-13 ENCOUNTER — Ambulatory Visit (INDEPENDENT_AMBULATORY_CARE_PROVIDER_SITE_OTHER): Payer: Medicare Other | Admitting: Internal Medicine

## 2016-11-13 ENCOUNTER — Encounter: Payer: Self-pay | Admitting: Internal Medicine

## 2016-11-13 VITALS — BP 132/82 | HR 82 | Ht 68.0 in | Wt 142.0 lb

## 2016-11-13 DIAGNOSIS — Z5181 Encounter for therapeutic drug level monitoring: Secondary | ICD-10-CM | POA: Diagnosis not present

## 2016-11-13 DIAGNOSIS — I480 Paroxysmal atrial fibrillation: Secondary | ICD-10-CM | POA: Diagnosis not present

## 2016-11-13 DIAGNOSIS — Z9889 Other specified postprocedural states: Secondary | ICD-10-CM

## 2016-11-13 DIAGNOSIS — Z8679 Personal history of other diseases of the circulatory system: Secondary | ICD-10-CM

## 2016-11-13 LAB — POCT INR: INR: 3.9

## 2016-11-13 NOTE — Telephone Encounter (Signed)
Received fax from [facility] for clearance for anticoag. Pt on warfarin for Afib with CHADS<4 and no history of stroke. Per protocol ok to hold warfarin 5 days prior to procedure. Resume evening of procedure or as directed by MD. Faxed back to Dr. Corena Pilgrim office.

## 2016-11-13 NOTE — Progress Notes (Signed)
ELECTROPHYSIOLOGY CONSULT NOTE  Patient ID: [name], MRN: [id number], DOB/AGE: 1945-08-12 71 y.o. Admit date: (Not on file) Date of Consult: 11/13/2016  Primary Physician: [name], MD Primary Cardiologist:KN**   Janina Mayo is being seen today for the evaluation of Afib at the request of Dr Meda Coffee   HPI [name] is a 71 y.o. female  With a history of minimally invasive mitral valve repair and maze 5/17. This was undertaken for PAF and primary mitral regurgitation. She has not had symptomatic atrial fibrillation and would like to come off of her anticoagulation.   Echocardiogram (postop) 7/17 demonstrated moderate atrial enlargement normal LV function without MR  No interval palpitations or known Afib  Reviewed Op Note,  Atrial clipping performed  No prior stroke   TSH and CBC nl 5/18  Past Medical History:  Diagnosis Date  . Heart murmur   . History of melanoma excision   . History of palpitations   . IBS (irritable bowel syndrome)   . INSOMNIA, CHRONIC 03/29/2009  . Mitral regurgitation    severe by TEE  . MVP (mitral valve prolapse)   . Osteopenia 07/2016   T score -2.1 FRAX 11%/2.1%  . Paroxysmal atrial fibrillation ([place]) 10/18/2014  . Peripheral vascular disease ([place]) 10/16   RT LEG dvt  . Pneumonia 11/17   hx  . PONV (postoperative nausea and vomiting)   . Prepatellar bursitis    RT KNEE  . S/P minimally invasive maze operation for atrial fibrillation 11/19/2015   Complete bilateral atrial lesion set using cryothermy and bipolar radiofrequency ablation with clipping of LA appendage via right mini thoracotomy approch  . S/P minimally invasive mitral valve repair 11/19/2015   Complex valvuloplasty including artificial Gore-tex neochord placement x 22, closure of cleft in posterior leaflet, and 34 mm Edwards Physio II ring annuloplasty via right mini thoracotomy approach  . Skin cancer of arm    Squamous      Surgical History:  Past  Surgical History:  Procedure Laterality Date  . ABDOMINAL HYSTERECTOMY  1980   WITH BSO/FIBROIDS  . APPENDECTOMY  1969  . CARDIAC CATHETERIZATION N/A 10/21/2015   Procedure: Right/Left Heart Cath and Coronary Angiography;  Surgeon: Larey Dresser, MD;  Location: [facility];  Service: Cardiovascular;  Laterality: N/A;  . CLIPPING OF ATRIAL APPENDAGE N/A 11/19/2015   Procedure: CLIPPING OF ATRIAL APPENDAGE;  Surgeon: Rexene Alberts, MD;  Location: [facility];  Service: Open Heart Surgery;  Laterality: N/A;  . COLONOSCOPY    . DIAGNOSTIC LAPAROSCOPY     expl  . EXCISION MELANOMA WITH SENTINEL LYMPH NODE BIOPSY Right 10/02/2013   Procedure: WIDE EXCISION MELANOMA RIGHT ARM WITH SENTINEL LYMPH NODE BIOPSY;  Surgeon: Shann Medal, MD;  Location: [facility];  Service: General;  Laterality: Right;  . KNEE BURSECTOMY Right 03/05/2014   Procedure: RIGHT KNEE BURSECTOMY;  Surgeon: Gearlean Alf, MD;  [facility];  Service: Orthopedics;  Laterality: Right;  . MINIMALLY INVASIVE MAZE PROCEDURE N/A 11/19/2015   Procedure: MINIMALLY INVASIVE MAZE PROCEDURE;  Surgeon: Rexene Alberts, MD;  Location: [facility];  Service: Open Heart Surgery;  Laterality: N/A;  . MITRAL VALVE REPAIR Right 11/19/2015   Procedure: MINIMALLY INVASIVE MITRAL VALVE REPAIR (MVR);  Surgeon: Rexene Alberts, MD;  Location: [facility];  Service: Open Heart Surgery;  Laterality: Right;  . SHOULDER ARTHROSCOPY  2010   right  . TEE WITHOUT CARDIOVERSION N/A  10/21/2015   Procedure: TRANSESOPHAGEAL ECHOCARDIOGRAM (TEE);  Surgeon: Larey Dresser, MD;  Location: [facility];  Service: Cardiovascular;  Laterality: N/A;  . TEE WITHOUT CARDIOVERSION N/A 11/19/2015   Procedure: TRANSESOPHAGEAL ECHOCARDIOGRAM (TEE);  Surgeon: Rexene Alberts, MD;  Location: [facility];  Service: Open Heart Surgery;  Laterality: N/A;  . THYROID CYST EXCISION  05-01-11   RIGHT  . TONSILLECTOMY  1980     Home Meds: Prior to Admission medications     Medication Sig Start Date End Date Taking? Authorizing Provider  atorvastatin (LIPITOR) 20 MG tablet Take 1 tablet (20 mg total) by mouth daily. 05/20/16  Yes Larey Dresser, MD  desonide (DESOWEN) 0.05 % cream Apply 1 application topically daily as needed. For itching per patient   Yes [provider]  dicyclomine (BENTYL) 20 MG tablet Take 1 tablet (20 mg total) by mouth every 6 (six) hours as needed for spasms. 09/28/16  Yes Burchette, Alinda Sierras, MD  ibuprofen (ADVIL,MOTRIN) 200 MG tablet Take 200 mg by mouth every 6 (six) hours as needed for moderate pain.   Yes [provider]  metoprolol succinate (TOPROL-XL) 25 MG 24 hr tablet Take 25 mg by mouth daily. Take at bedtime   Yes [provider]  mometasone (NASONEX) 50 MCG/ACT nasal spray Place 2 sprays into the nose daily. 05/25/16  Yes Colin Benton R, DO  nystatin (MYCOSTATIN/NYSTOP) 100000 UNIT/GM POWD Apply 1 application topically daily as needed (skin).  03/07/15  Yes [provider]  warfarin (COUMADIN) 4 MG tablet Take as directed by Coumadin Clinic 10/30/16  Yes Dorothy Spark, MD  zolpidem (AMBIEN) 10 MG tablet TAKE 1 TABLET BY MOUTH EVERY NIGHT AT BEDTIME AS NEEDED FOR SLEEP 08/28/16  Yes Burchette, Alinda Sierras, MD    Allergies:  Allergies  Allergen Reactions  . Inderal [Propranolol Hcl] Itching    SEVERE LIP(INSIDE ITCH) AND SKIN DRYNESS  . Latex Itching    Sensitive to latex  . Tape Other (See Comments)    Very sensitive skin-    Social History   Social History  . Marital status: Married    Spouse name: N/A  . Number of children: N/A  . Years of education: N/A   Occupational History  . retired Unemployed   Social History Main Topics  . Smoking status: Former Smoker    Packs/day: 1.00    Years: 20.00    Types: Cigarettes    Quit date: 08/11/1970  . Smokeless tobacco: Never Used  . Alcohol use 8.4 oz/week    14 Glasses of wine per week  . Drug use: No  . Sexual activity: Yes     Birth control/ protection: Surgical     Comment: intercourse age 62, sexual partners less than 5   Other Topics Concern  . Not on file   Social History Narrative  . No narrative on file     Family History  Problem Relation Age of Onset  . Diabetes type II Mother   . Heart disease Mother        age 35  . Alcohol abuse Father   . Throat cancer Father   . Ovarian cancer Maternal Aunt   . Breast cancer Maternal Grandmother      ROS:  Please see the history of present illness.     All other systems reviewed and negative.    Physical Exam: Blood pressure 132/82, pulse 82, height 5\' 8"  (1.727 m), weight 142 lb (64.4 kg), last menstrual period  06/22/1977, SpO2 98 %. General: Well developed, well nourished female in no acute distress. Head: Normocephalic, atraumatic, sclera non-icteric, no xanthomas, nares are without discharge. EENT: normal  Lymph Nodes:  none Neck: Negative for carotid bruits. JVD not elevated. Back:without scoliosis kyphosis  Lungs: Clear bilaterally to auscultation without wheezes, rales, or rhonchi. Breathing is unlabored. Heart: RRR with S1 S2.  2/6 systolic murmur . No rubs, or gallops appreciated. Abdomen: Soft, non-tender, non-distended with normoactive bowel sounds. No hepatomegaly. No rebound/guarding. No obvious abdominal masses. Msk:  Strength and tone appear normal for age. Extremities: No clubbing or cyanosis. No edema.  Distal pedal pulses are 2+ and equal bilaterally. Skin: Warm and Dry Neuro: Alert and oriented X 3. CN III-XII intact Grossly normal sensory and motor function . Psych:  Responds to questions appropriately with a normal affect.      Labs: Cardiac Enzymes No results for input(s): CKTOTAL, CKMB, TROPONINI in the last 72 hours. CBC Lab Results  Component Value Date   WBC 3.4 10/23/2016   HGB 14.7 02/20/2016   HCT 40.7 10/23/2016   MCV 98 (H) 10/23/2016   PLT 272 10/23/2016   PROTIME:  Recent Labs  11/13/16 0938  INR 3.9     Chemistry No results for input(s): NA, K, CL, CO2, BUN, CREATININE, CALCIUM, PROT, BILITOT, ALKPHOS, ALT, AST, GLUCOSE in the last 168 hours.  Invalid input(s): LABALBU Lipids Lab Results  Component Value Date   CHOL 165 10/23/2016   HDL 91 10/23/2016   LDLCALC 55 10/23/2016   TRIG 97 10/23/2016   BNP Pro B Natriuretic peptide (BNP)  Date/Time Value Ref Range Status  09/27/2012 02:46 PM 38.0 0.0 - 100.0 pg/mL Final  03/26/2011 03:15 AM 147.2 (H) 0 - 125 pg/mL Final  12/15/2010 04:07 PM 56.0 0.0 - 100.0 pg/mL Final   Thyroid Function Tests: No results for input(s): TSH, T4TOTAL, T3FREE, THYROIDAB in the last 72 hours.  Invalid input(s): FREET3 Miscellaneous Lab Results  Component Value Date   DDIMER 0.53 (H) 03/25/2011    Radiology/Studies:  No results found.  EKG: Sinus    Assessment and Plan: Persistent atrial fibrillation s/p MAZE at time of mitral valve repair  PT would like to come off anticoagulation--currently on warfarin--as she has had no Afib of which she is aware in the last year.  There are no goodlines in making this decision, but it is reasonable to look for recurrent atrial fibrillation so as to better inform this decision.  We will proceed with LINQ insertion,.        Virl Axe

## 2016-11-13 NOTE — Patient Instructions (Signed)
Medication Instructions: - Your physician recommends that you continue on your current medications as directed. Please refer to the Current Medication list given to you today.  Labwork: - none ordrerd  Procedures/Testing: - Your physician has recommended that you have a loop recorder (LINQ) implant.  --Please arrive at the hospital at 8:30 am. [facility] to the [facility] "A"  --Once you enter, go straight down the hallway to "Admitting", it will be located on your left --You may take all of your morning medication  Follow-Up: Your physician recommends that you schedule a follow-up appointment in: 10 - 14 days after 12/14/16 for your wound check with the Device Clinic   Any Additional Special Instructions Will Be Listed Below (If Applicable).     If you need a refill on your cardiac medications before your next appointment, please call your pharmacy.

## 2016-11-17 NOTE — Telephone Encounter (Signed)
Left a message to return call.  

## 2016-11-20 NOTE — Telephone Encounter (Signed)
Pt has been notified and aware to hold the Warfin 5 days prior. She states clear understanding.

## 2016-11-27 ENCOUNTER — Other Ambulatory Visit: Payer: Self-pay | Admitting: Family Medicine

## 2016-11-27 NOTE — Telephone Encounter (Signed)
Last refill 08/28/16 and last office visit 10/13/16.  Okay to fill?

## 2016-11-30 ENCOUNTER — Ambulatory Visit (INDEPENDENT_AMBULATORY_CARE_PROVIDER_SITE_OTHER): Payer: Medicare Other

## 2016-11-30 ENCOUNTER — Telehealth: Payer: Self-pay | Admitting: Gastroenterology

## 2016-11-30 DIAGNOSIS — Z8679 Personal history of other diseases of the circulatory system: Secondary | ICD-10-CM

## 2016-11-30 DIAGNOSIS — Z9889 Other specified postprocedural states: Secondary | ICD-10-CM | POA: Diagnosis not present

## 2016-11-30 DIAGNOSIS — I480 Paroxysmal atrial fibrillation: Secondary | ICD-10-CM | POA: Diagnosis not present

## 2016-11-30 DIAGNOSIS — Z5181 Encounter for therapeutic drug level monitoring: Secondary | ICD-10-CM

## 2016-11-30 LAB — POCT INR: INR: 2.7

## 2016-11-30 NOTE — Telephone Encounter (Signed)
Refill OK

## 2016-11-30 NOTE — Telephone Encounter (Signed)
Per phone note dated 11/13/16 pt to hold coumadin for 5 days prior to colonoscopy. Colon scheduled for 12/10/16. Left message for pt to call back.

## 2016-11-30 NOTE — Telephone Encounter (Signed)
Rx done. 

## 2016-11-30 NOTE — Telephone Encounter (Signed)
Spoke with pt and she knows to hold coumadin for 5 days prior to colon. She was confused because she got a letter in the mail that was addressed to Mr. Burnette stating the recall colon date had been reviewed and changed. Pt will bring letter in for [place] to see what happened. No record of letter in her chart.

## 2016-12-02 ENCOUNTER — Telehealth: Payer: Self-pay | Admitting: Gastroenterology

## 2016-12-02 NOTE — Telephone Encounter (Signed)
It will not be a problem.  Thanks for checking.

## 2016-12-02 NOTE — Telephone Encounter (Signed)
Patient will only be having a cleaning on 6/20 and always takes prophylactic amoxicillin prior to dental procedures. She is wondering if this will be okay to proceed with colonoscopy on 6/21.

## 2016-12-02 NOTE — Telephone Encounter (Signed)
Patient advised.

## 2016-12-07 ENCOUNTER — Encounter: Payer: Medicare Other | Admitting: Gastroenterology

## 2016-12-07 ENCOUNTER — Encounter: Payer: Medicare Other | Admitting: Thoracic Surgery (Cardiothoracic Vascular Surgery)

## 2016-12-10 ENCOUNTER — Encounter: Payer: Self-pay | Admitting: Gastroenterology

## 2016-12-10 ENCOUNTER — Ambulatory Visit (AMBULATORY_SURGERY_CENTER): Payer: Medicare Other | Admitting: Gastroenterology

## 2016-12-10 VITALS — BP 134/80 | HR 67 | Temp 97.5°F | Resp 11 | Ht 67.0 in | Wt 142.0 lb

## 2016-12-10 DIAGNOSIS — Z1211 Encounter for screening for malignant neoplasm of colon: Secondary | ICD-10-CM | POA: Diagnosis not present

## 2016-12-10 DIAGNOSIS — K5901 Slow transit constipation: Secondary | ICD-10-CM

## 2016-12-10 DIAGNOSIS — R1032 Left lower quadrant pain: Secondary | ICD-10-CM

## 2016-12-10 MED ORDER — SODIUM CHLORIDE 0.9 % IV SOLN: 500.0000 mL | INTRAVENOUS | Status: DC

## 2016-12-10 NOTE — Patient Instructions (Signed)
Diverticulosis seen today. Handout given on diverticulosis. Resume Coumadin (Warfarin) at prior dose today.  Repeat colonoscopy (or cologuard) in 10 years. Call us with any questions or concerns. Thank you!   YOU HAD AN ENDOSCOPIC PROCEDURE TODAY AT [facility]:   Refer to the procedure report that was given to you for any specific questions about what was found during the examination.  If the procedure report does not answer your questions, please call your gastroenterologist to clarify.  If you requested that your care partner not be given the details of your procedure findings, then the procedure report has been included in a sealed envelope for you to review at your convenience later.  YOU SHOULD EXPECT: Some feelings of bloating in the abdomen. Passage of more gas than usual.  Walking can help get rid of the air that was put into your GI tract during the procedure and reduce the bloating. If you had a lower endoscopy (such as a colonoscopy or flexible sigmoidoscopy) you may notice spotting of blood in your stool or on the toilet paper. If you underwent a bowel prep for your procedure, you may not have a normal bowel movement for a few days.  Please Note:  You might notice some irritation and congestion in your nose or some drainage.  This is from the oxygen used during your procedure.  There is no need for concern and it should clear up in a day or so.  SYMPTOMS TO REPORT IMMEDIATELY:   Following lower endoscopy (colonoscopy or flexible sigmoidoscopy):  Excessive amounts of blood in the stool  Significant tenderness or worsening of abdominal pains  Swelling of the abdomen that is new, acute  Fever of 100F or higher  For urgent or emergent issues, a gastroenterologist can be reached at any hour by calling [phone].   DIET:  We do recommend a small meal at first, but then you may proceed to your regular diet.  Drink plenty of fluids but you should avoid alcoholic  beverages for 24 hours.  ACTIVITY:  You should plan to take it easy for the rest of today and you should NOT DRIVE or use heavy machinery until tomorrow (because of the sedation medicines used during the test).    FOLLOW UP: Our staff will call the number listed on your records the next business day following your procedure to check on you and address any questions or concerns that you may have regarding the information given to you following your procedure. If we do not reach you, we will leave a message.  However, if you are feeling well and you are not experiencing any problems, there is no need to return our call.  We will assume that you have returned to your regular daily activities without incident.  If any biopsies were taken you will be contacted by phone or by letter within the next 1-3 weeks.  Please call us at [phone] if you have not heard about the biopsies in 3 weeks.    SIGNATURES/CONFIDENTIALITY: You and/or your care partner have signed paperwork which will be entered into your electronic medical record.  These signatures attest to the fact that that the information above on your After Visit Summary has been reviewed and is understood.  Full responsibility of the confidentiality of this discharge information lies with you and/or your care-partner.

## 2016-12-10 NOTE — Progress Notes (Signed)
Pt. Reports no changes to her medical or surgical history since pre-visit 11-12-2016

## 2016-12-10 NOTE — Op Note (Addendum)
[name]: [name] Procedure Date: 12/10/2016 9:58 AM MRN: [id number] Endoscopist: Accident. Loletha Carrow , MD Age: 71 Referring MD:  Date of Birth: [birth year] Gender: Female Account #: [id number] Procedure:                Colonoscopy Indications:              Abdominal pain in the left lower quadrant,                            Constipation Medicines:                Monitored Anesthesia Care Procedure:                Pre-Anesthesia Assessment:                           - Prior to the procedure, a History and Physical                            was performed, and patient medications and                            allergies were reviewed. The patient's tolerance of                            previous anesthesia was also reviewed. The risks                            and benefits of the procedure and the sedation                            options and risks were discussed with the patient.                            All questions were answered, and informed consent                            was obtained. Prior Anticoagulants: The patient has                            taken Coumadin (warfarin), last dose was 4 days                            prior to procedure. ASA Grade Assessment: III - A                            patient with severe systemic disease. After                            reviewing the risks and benefits, the patient was                            deemed in satisfactory condition to undergo the  procedure.                           After obtaining informed consent, the colonoscope                            was passed under direct vision. Throughout the                            procedure, the patient's blood pressure, pulse, and                            oxygen saturations were monitored continuously. The                            Colonoscope was introduced through the anus and                            advanced to the  the cecum, identified by                            appendiceal orifice and ileocecal valve. The                            colonoscopy was performed without difficulty. The                            patient tolerated the procedure well. The quality                            of the bowel preparation was excellent after                            lavage. The ileocecal valve, appendiceal orifice,                            and rectum were photographed. The quality of the                            bowel preparation was evaluated using the BBPS                            [facility] Bowel Preparation Scale) with scores of:                            Right Colon = 3, Transverse Colon = 3 and Left                            Colon = 3 (entire mucosa seen well with no residual                            staining, small fragments of stool or opaque  liquid). The total BBPS score equals 9. The bowel                            preparation used was SUPREP. Scope In: 87:56:43 AM Scope Out: 10:17:51 AM Scope Withdrawal Time: 0 hours 7 minutes 32 seconds  Total Procedure Duration: 0 hours 11 minutes 43 seconds  Findings:                 The perianal and digital rectal examinations were                            normal.                           Multiple diverticula were found in the left colon.                           The sigmoid colon was moderately redundant.                           A diffuse area of melanosis was found in the entire                            colon.                           The exam was otherwise without abnormality on                            direct and retroflexion views. Complications:            No immediate complications. Estimated Blood Loss:     Estimated blood loss: none. Impression:               - Diverticulosis in the left colon.                           - Redundant colon.                           - Melanosis in the colon.                            - The examination was otherwise normal on direct                            and retroflexion views.                           - No specimens collected.                           The patient's symptoms have abated with regulation                            of bowel habits on a daily fiber supplement. Recommendation:           - Patient has a contact  number available for                            emergencies. The signs and symptoms of potential                            delayed complications were discussed with the                            patient. Return to normal activities tomorrow.                            Written discharge instructions were provided to the                            patient.                           - Resume previous diet.                           - Consider repeat colonoscopy (or cologuard) in 10                            years for screening purposes if overall health                            allows.                           - Resume Coumadin (warfarin) at prior dose today. Henry L. Loletha Carrow, MD 12/10/2016 10:27:49 AM This report has been signed electronically.

## 2016-12-10 NOTE — Progress Notes (Signed)
Alert and oriented x3, pleased with MAC, report to RN robin 

## 2016-12-11 ENCOUNTER — Telehealth: Payer: Self-pay | Admitting: *Deleted

## 2016-12-11 NOTE — Telephone Encounter (Signed)
  Follow up Call-  Call back number 12/10/2016  Post procedure Call Back phone  # [phone]  Permission to leave phone message Yes  Some recent data might be hidden     Patient questions:  Do you have a fever, pain , or abdominal swelling? No. Pain Score  0 *  Have you tolerated food without any problems? Yes.    Have you been able to return to your normal activities? Yes.    Do you have any questions about your discharge instructions: Diet   No. Medications  No. Follow up visit  No.  Do you have questions or concerns about your Care? No.  Actions: * If pain score is 4 or above: No action needed, pain <4.

## 2016-12-14 ENCOUNTER — Encounter (HOSPITAL_COMMUNITY): Payer: Self-pay | Admitting: Internal Medicine

## 2016-12-14 ENCOUNTER — Ambulatory Visit (HOSPITAL_COMMUNITY)
Admission: RE | Admit: 2016-12-14 | Discharge: 2016-12-14 | Disposition: A | Discharge status: Home / Self Care | Payer: Medicare Other | Source: Ambulatory Visit | Attending: Internal Medicine | Admitting: Internal Medicine

## 2016-12-14 ENCOUNTER — Encounter (HOSPITAL_COMMUNITY)
Admission: RE | Disposition: A | Discharge status: Home / Self Care | Payer: Self-pay | Source: Ambulatory Visit | Attending: Internal Medicine

## 2016-12-14 ENCOUNTER — Encounter: Payer: Medicare Other | Admitting: Thoracic Surgery (Cardiothoracic Vascular Surgery)

## 2016-12-14 DIAGNOSIS — K589 Irritable bowel syndrome without diarrhea: Secondary | ICD-10-CM | POA: Diagnosis not present

## 2016-12-14 DIAGNOSIS — I480 Paroxysmal atrial fibrillation: Secondary | ICD-10-CM | POA: Diagnosis present

## 2016-12-14 DIAGNOSIS — F5104 Psychophysiologic insomnia: Secondary | ICD-10-CM | POA: Insufficient documentation

## 2016-12-14 DIAGNOSIS — I341 Nonrheumatic mitral (valve) prolapse: Secondary | ICD-10-CM | POA: Insufficient documentation

## 2016-12-14 DIAGNOSIS — Z91040 Latex allergy status: Secondary | ICD-10-CM | POA: Insufficient documentation

## 2016-12-14 DIAGNOSIS — M8580 Other specified disorders of bone density and structure, unspecified site: Secondary | ICD-10-CM | POA: Insufficient documentation

## 2016-12-14 DIAGNOSIS — Z888 Allergy status to other drugs, medicaments and biological substances status: Secondary | ICD-10-CM | POA: Insufficient documentation

## 2016-12-14 DIAGNOSIS — I739 Peripheral vascular disease, unspecified: Secondary | ICD-10-CM | POA: Insufficient documentation

## 2016-12-14 DIAGNOSIS — R011 Cardiac murmur, unspecified: Secondary | ICD-10-CM | POA: Diagnosis not present

## 2016-12-14 DIAGNOSIS — Z86718 Personal history of other venous thrombosis and embolism: Secondary | ICD-10-CM | POA: Diagnosis not present

## 2016-12-14 DIAGNOSIS — Z79899 Other long term (current) drug therapy: Secondary | ICD-10-CM | POA: Diagnosis not present

## 2016-12-14 HISTORY — PX: LOOP RECORDER INSERTION: EP1214

## 2016-12-14 SURGERY — LOOP RECORDER INSERTION

## 2016-12-14 MED ORDER — LIDOCAINE-EPINEPHRINE 1 %-1:100000 IJ SOLN
INTRAMUSCULAR | Status: AC
  Filled 2016-12-14: qty 1

## 2016-12-14 MED ORDER — LIDOCAINE-EPINEPHRINE 1 %-1:100000 IJ SOLN
INTRAMUSCULAR | Status: DC | PRN
  Administered 2016-12-14: 20 mL

## 2016-12-14 SURGICAL SUPPLY — 2 items
LOOP REVEAL LINQSYS (Prosthesis & Implant Heart) ×2 IMPLANT
PACK LOOP INSERTION (CUSTOM PROCEDURE TRAY) ×3 IMPLANT

## 2016-12-14 NOTE — H&P (Signed)
Updated h and Pwithout change

## 2016-12-18 ENCOUNTER — Ambulatory Visit (INDEPENDENT_AMBULATORY_CARE_PROVIDER_SITE_OTHER): Payer: Medicare Other

## 2016-12-18 DIAGNOSIS — I480 Paroxysmal atrial fibrillation: Secondary | ICD-10-CM | POA: Diagnosis not present

## 2016-12-18 DIAGNOSIS — Z9889 Other specified postprocedural states: Secondary | ICD-10-CM

## 2016-12-18 DIAGNOSIS — Z8679 Personal history of other diseases of the circulatory system: Secondary | ICD-10-CM

## 2016-12-18 DIAGNOSIS — Z5181 Encounter for therapeutic drug level monitoring: Secondary | ICD-10-CM

## 2016-12-18 DIAGNOSIS — Z7901 Long term (current) use of anticoagulants: Secondary | ICD-10-CM | POA: Diagnosis not present

## 2016-12-18 LAB — POCT INR: INR: 1.7

## 2016-12-24 ENCOUNTER — Ambulatory Visit (INDEPENDENT_AMBULATORY_CARE_PROVIDER_SITE_OTHER): Payer: Medicare Other | Admitting: *Deleted

## 2016-12-24 DIAGNOSIS — I480 Paroxysmal atrial fibrillation: Secondary | ICD-10-CM

## 2016-12-24 LAB — CUP PACEART INCLINIC DEVICE CHECK
Implantable Pulse Generator Implant Date: 20180625
MDC IDC SESS DTM: 20180705143206

## 2016-12-24 NOTE — Progress Notes (Signed)
Loop wound check in clinic. Steri-strips removed, incision edges approximated, wound well healed. Battery status: good. R-waves 0.20mV. No episodes, on warfarin for PAF. Monthly summary reports and ROV with [facility] Clinic in November, SK in 12 months.

## 2016-12-28 ENCOUNTER — Encounter: Payer: Medicare Other | Admitting: Thoracic Surgery (Cardiothoracic Vascular Surgery)

## 2016-12-30 ENCOUNTER — Other Ambulatory Visit: Payer: Self-pay | Admitting: Family Medicine

## 2016-12-31 ENCOUNTER — Telehealth: Payer: Self-pay | Admitting: Cardiology

## 2016-12-31 NOTE — Telephone Encounter (Signed)
[name] is calling to talk about her medication Metoprolol . She is feeling dizzy when she takes the medication and wants to know if there is something different that she can take that will work the same . Please call   Thanks

## 2016-12-31 NOTE — Telephone Encounter (Signed)
Last Rx given on 6/11 for #30 with no ref

## 2016-12-31 NOTE — Telephone Encounter (Signed)
Patient states that she has been taking Metoprolol Succinate 25 MG QD at night. She states that she has continued to be tired, fatigued, and dizzy on the medication. She states that she is lightheaded and dizzy when she changes positions. Patient has not been checking her BP and HR. Patient advised to change positions slowly and stay hydrated. She is wanting to ask Dr. Meda Coffee if she could try another medication. Message routed to Dr. Meda Coffee for review and recommendation.

## 2017-01-01 NOTE — Telephone Encounter (Signed)
Rx done. 

## 2017-01-01 NOTE — Telephone Encounter (Signed)
Refill for 3 months. 

## 2017-01-04 ENCOUNTER — Other Ambulatory Visit: Payer: Self-pay | Admitting: Cardiology

## 2017-01-04 MED ORDER — DILTIAZEM HCL ER COATED BEADS 120 MG PO CP24: 120.0000 mg | ORAL_CAPSULE | Freq: Every day | ORAL | 1 refills | Status: DC

## 2017-01-04 NOTE — Telephone Encounter (Signed)
Please switch to cardizem CD 120 mg po daily instead.

## 2017-01-04 NOTE — Telephone Encounter (Signed)
Informed the pt of recommendations per Dr Meda Coffee, for complaints mentioned.  Discontinued metoprolol in med list.  Confirmed the pharmacy of choice with the pt.  Sent in a 30 day supply of this med to make sure she tolerates cardizem cd appropriately.  Pt was advised to call our office for further refills.  Pt verbalized understanding and agrees with this plan.

## 2017-01-06 ENCOUNTER — Telehealth: Payer: Self-pay | Admitting: Internal Medicine

## 2017-01-06 ENCOUNTER — Telehealth: Payer: Self-pay | Admitting: Cardiology

## 2017-01-06 NOTE — Telephone Encounter (Signed)
[name] is returning a call about her device . Please call

## 2017-01-06 NOTE — Telephone Encounter (Signed)
LMOVM requesting that pt send manual transmission b/c home monitor has not updated in at least 14 days.    

## 2017-01-06 NOTE — Telephone Encounter (Signed)
Spoke w/ pt and informed her that her remote transmission was received and she may want to move the monitor a little closer to her bed. Pt verbalized understanding.

## 2017-01-08 ENCOUNTER — Ambulatory Visit (INDEPENDENT_AMBULATORY_CARE_PROVIDER_SITE_OTHER): Payer: Medicare Other | Admitting: *Deleted

## 2017-01-08 DIAGNOSIS — Z9889 Other specified postprocedural states: Secondary | ICD-10-CM

## 2017-01-08 DIAGNOSIS — I480 Paroxysmal atrial fibrillation: Secondary | ICD-10-CM

## 2017-01-08 DIAGNOSIS — Z7901 Long term (current) use of anticoagulants: Secondary | ICD-10-CM

## 2017-01-08 DIAGNOSIS — Z8679 Personal history of other diseases of the circulatory system: Secondary | ICD-10-CM | POA: Diagnosis not present

## 2017-01-08 DIAGNOSIS — Z5181 Encounter for therapeutic drug level monitoring: Secondary | ICD-10-CM

## 2017-01-08 LAB — POCT INR: INR: 2.5

## 2017-01-13 ENCOUNTER — Ambulatory Visit (INDEPENDENT_AMBULATORY_CARE_PROVIDER_SITE_OTHER): Payer: Medicare Other | Admitting: *Deleted

## 2017-01-13 DIAGNOSIS — I4891 Unspecified atrial fibrillation: Secondary | ICD-10-CM | POA: Diagnosis not present

## 2017-01-14 NOTE — Progress Notes (Signed)
Carelink Summary Report / Loop Recorder 

## 2017-01-18 ENCOUNTER — Encounter: Payer: Medicare Other | Admitting: Thoracic Surgery (Cardiothoracic Vascular Surgery)

## 2017-01-25 DIAGNOSIS — L821 Other seborrheic keratosis: Secondary | ICD-10-CM | POA: Diagnosis not present

## 2017-01-25 DIAGNOSIS — Z85828 Personal history of other malignant neoplasm of skin: Secondary | ICD-10-CM | POA: Diagnosis not present

## 2017-01-25 DIAGNOSIS — D1801 Hemangioma of skin and subcutaneous tissue: Secondary | ICD-10-CM | POA: Diagnosis not present

## 2017-01-25 DIAGNOSIS — Z85820 Personal history of malignant melanoma of skin: Secondary | ICD-10-CM | POA: Diagnosis not present

## 2017-01-25 DIAGNOSIS — D225 Melanocytic nevi of trunk: Secondary | ICD-10-CM | POA: Diagnosis not present

## 2017-01-25 DIAGNOSIS — L814 Other melanin hyperpigmentation: Secondary | ICD-10-CM | POA: Diagnosis not present

## 2017-01-27 ENCOUNTER — Telehealth: Payer: Self-pay | Admitting: Cardiology

## 2017-01-27 NOTE — Telephone Encounter (Signed)
New Message    Pt states she wants to leave a message for Ivy. She wants to take Tylenol and wants to know if it will interfere with any of her other medications. Requests a call back.

## 2017-01-27 NOTE — Telephone Encounter (Signed)
Patient informed that his message would be sent to our pharmacist for review.

## 2017-01-28 LAB — CUP PACEART REMOTE DEVICE CHECK
Date Time Interrogation Session: 20180725141119
MDC IDC PG IMPLANT DT: 20180625

## 2017-01-28 NOTE — Telephone Encounter (Signed)
Patient informed. 

## 2017-01-28 NOTE — Telephone Encounter (Signed)
Active ingredient is the same in Tylenol and Tylenol arthritis. Should be ok with other medications.

## 2017-01-28 NOTE — Telephone Encounter (Signed)
Tylenol does not interact with her other medications. Would recommend no more than recommended dose on package.

## 2017-01-28 NOTE — Telephone Encounter (Signed)
Patient informed but says her question was related to tylenol arthritis.  Please advise if tylenol arthritis is safe to use.

## 2017-02-01 ENCOUNTER — Ambulatory Visit (INDEPENDENT_AMBULATORY_CARE_PROVIDER_SITE_OTHER): Payer: Medicare Other | Admitting: Thoracic Surgery (Cardiothoracic Vascular Surgery)

## 2017-02-01 ENCOUNTER — Encounter: Payer: Self-pay | Admitting: Thoracic Surgery (Cardiothoracic Vascular Surgery)

## 2017-02-01 VITALS — BP 130/88 | HR 92 | Resp 20 | Ht 68.0 in | Wt 143.0 lb

## 2017-02-01 DIAGNOSIS — Z9889 Other specified postprocedural states: Secondary | ICD-10-CM | POA: Diagnosis not present

## 2017-02-01 NOTE — Patient Instructions (Signed)

## 2017-02-01 NOTE — Progress Notes (Signed)
[address]       [name] [address]             [facility] OFFICE NOTE  Referring Provider is April Dresser, MD  Primary Cardiologist is April Dawley, MD PCP is April Post, MD   HPI:  Patient is a 71 year old female who returns to the office today for routine follow-up more than one year status Liu minimally invasive mitral valve repair and maze procedure on 09/19/2015 for severe symptomatic primary mitral regurgitation and recurrent paroxysmal atrial fibrillation. She was last seen here in our office on 06/01/2016 at which time she was doing well and maintaining sinus rhythm. Her most recent follow-up echocardiogram was performed 01/03/2016 revealing intact mitral valve repair with no significant mitral regurgitation, normal transvalvular gradients, and normal left ventricular systolic function. Since she was last seen in our office she has continued to do well. She was referred to Dr. Caryl Liu for discussions regarding the possibility of stopping warfarin anticoagulation. She has undergone implantation of a loop recorder with hopes that data will suggest that stopping warfarin would be associated with low risk. She returns to our office today for routine follow-up and reports that she is doing well. She is very active physically and she denies any symptoms of exertional shortness of breath or chest discomfort. She states that her breathing and exercise tolerance seems to be a little bit better than it was prior to surgery. She has not had palpitations or other symptoms to suggest a recurrence of atrial fibrillation. She has not had any particular complications related to long-term warfarin anticoagulation.   Current Outpatient Prescriptions  Medication Sig Dispense Refill  . atorvastatin (LIPITOR) 20 MG tablet Take 1 tablet (20 mg total) by mouth daily. 90 tablet 3  . desonide (DESOWEN) 0.05 % cream Apply 1 application  topically daily as needed. For itching per patient    . diltiazem (CARDIZEM CD) 120 MG 24 hr capsule Take 1 capsule (120 mg total) by mouth daily. 30 capsule 1  . ibuprofen (ADVIL,MOTRIN) 200 MG tablet Take 200 mg by mouth every 6 (six) hours as needed for moderate pain.    Marland Kitchen nystatin (MYCOSTATIN/NYSTOP) 100000 UNIT/GM POWD Apply 1 application topically daily as needed (skin).   2  . warfarin (COUMADIN) 4 MG tablet Take 2-4 mg by mouth See admin instructions. 2 mg Sun Mon Wed Fri.  4 mg on Tues Thurs and Sat    . zolpidem (AMBIEN) 10 MG tablet TAKE 1 TABLET BY MOUTH EVERY DAY AT BEDTIME 30 tablet 3   No current facility-administered medications for this visit.       Physical Exam:   BP 130/88   Pulse 92   Resp 20   Ht 5\' 8"  (1.727 m)   Wt 143 lb (64.9 kg)   LMP 06/22/1977   SpO2 99% Comment: RA  BMI 21.74 kg/m   General:  Well-appearing  Chest:   Clear  CV:   Regular rate and rhythm without murmur  Incisions:  Completely healed  Abdomen:  Soft  Extremities:  Warm and well-perfused  Diagnostic Tests:  2 channel telemetry rhythm strip demonstrates normal sinus rhythm   Impression:  Patient is doing very well more than one year status Liu minimally invasive mitral valve repair and maze procedure. She is maintaining sinus rhythm.  Plan:  We have not recommended any changes to the patient's current medications.  The patient has  been reminded regarding the importance of dental hygiene and the lifelong need for antibiotic prophylaxis for all dental cleanings and other related invasive procedures.  She will continue to follow-up with Dr. Caryl Liu, [name] in the atrial fibrillation clinic, and Dr. Meda Liu.  In the future she will call and return to see [place] as needed.  I spent in excess of 15 minutes during the conduct of this office consultation and >50% of this time involved direct face-to-face encounter with the patient for counseling and/or coordination of their  care.    [name]. April Manns, MD 02/01/2017 11:13 AM

## 2017-02-02 DIAGNOSIS — H2513 Age-related nuclear cataract, bilateral: Secondary | ICD-10-CM | POA: Diagnosis not present

## 2017-02-02 DIAGNOSIS — H5203 Hypermetropia, bilateral: Secondary | ICD-10-CM | POA: Diagnosis not present

## 2017-02-05 ENCOUNTER — Ambulatory Visit (INDEPENDENT_AMBULATORY_CARE_PROVIDER_SITE_OTHER): Payer: Medicare Other | Admitting: *Deleted

## 2017-02-05 DIAGNOSIS — Z9889 Other specified postprocedural states: Secondary | ICD-10-CM

## 2017-02-05 DIAGNOSIS — I480 Paroxysmal atrial fibrillation: Secondary | ICD-10-CM | POA: Diagnosis not present

## 2017-02-05 DIAGNOSIS — Z7901 Long term (current) use of anticoagulants: Secondary | ICD-10-CM

## 2017-02-05 DIAGNOSIS — Z8679 Personal history of other diseases of the circulatory system: Secondary | ICD-10-CM

## 2017-02-05 DIAGNOSIS — Z5181 Encounter for therapeutic drug level monitoring: Secondary | ICD-10-CM | POA: Diagnosis not present

## 2017-02-05 LAB — POCT INR: INR: 4.3

## 2017-02-12 ENCOUNTER — Ambulatory Visit (INDEPENDENT_AMBULATORY_CARE_PROVIDER_SITE_OTHER): Payer: Medicare Other | Admitting: *Deleted

## 2017-02-12 DIAGNOSIS — I480 Paroxysmal atrial fibrillation: Secondary | ICD-10-CM

## 2017-02-12 NOTE — Progress Notes (Signed)
Carelink Summary Report / Loop Recorder 

## 2017-02-18 LAB — CUP PACEART REMOTE DEVICE CHECK
Implantable Pulse Generator Implant Date: 20180625
MDC IDC SESS DTM: 20180824143916

## 2017-02-19 ENCOUNTER — Ambulatory Visit (INDEPENDENT_AMBULATORY_CARE_PROVIDER_SITE_OTHER): Payer: Medicare Other

## 2017-02-19 DIAGNOSIS — Z7901 Long term (current) use of anticoagulants: Secondary | ICD-10-CM

## 2017-02-19 DIAGNOSIS — Z8679 Personal history of other diseases of the circulatory system: Secondary | ICD-10-CM

## 2017-02-19 DIAGNOSIS — Z5181 Encounter for therapeutic drug level monitoring: Secondary | ICD-10-CM | POA: Diagnosis not present

## 2017-02-19 DIAGNOSIS — Z9889 Other specified postprocedural states: Secondary | ICD-10-CM | POA: Diagnosis not present

## 2017-02-19 DIAGNOSIS — I480 Paroxysmal atrial fibrillation: Secondary | ICD-10-CM

## 2017-02-19 LAB — POCT INR: INR: 3.3

## 2017-02-24 DIAGNOSIS — Z23 Encounter for immunization: Secondary | ICD-10-CM | POA: Diagnosis not present

## 2017-03-01 ENCOUNTER — Ambulatory Visit (INDEPENDENT_AMBULATORY_CARE_PROVIDER_SITE_OTHER): Payer: Medicare Other | Admitting: Family Medicine

## 2017-03-01 ENCOUNTER — Encounter: Payer: Self-pay | Admitting: Family Medicine

## 2017-03-01 VITALS — BP 110/80 | HR 89 | Temp 98.3°F | Wt 144.5 lb

## 2017-03-01 DIAGNOSIS — E784 Other hyperlipidemia: Secondary | ICD-10-CM

## 2017-03-01 DIAGNOSIS — I480 Paroxysmal atrial fibrillation: Secondary | ICD-10-CM | POA: Diagnosis not present

## 2017-03-01 DIAGNOSIS — E7849 Other hyperlipidemia: Secondary | ICD-10-CM

## 2017-03-01 DIAGNOSIS — G4700 Insomnia, unspecified: Secondary | ICD-10-CM

## 2017-03-01 MED ORDER — DILTIAZEM HCL ER COATED BEADS 120 MG PO CP24: 120.0000 mg | ORAL_CAPSULE | Freq: Every day | ORAL | 3 refills | Status: DC

## 2017-03-01 NOTE — Progress Notes (Signed)
Subjective:     Patient ID: [name], female   DOB: 10/31/1945, 71 y.o.   MRN: [id number]  HPI Patient seen for routine medical follow-up. She and her husband are preparing to move to [place] for the winter. They just bought a place there last spring in [facility]. Her chronic problems include history of atrial fibrillation. She had minimally invasive mitral valve repair with Maze procedure last year. She's done fairly well since then. She has chronic insomnia. She has hyperlipidemia treated with atorvastatin. She was changed from metoprolol to diltiazem. Requesting refills. She is on Coumadin and has already established with a provider down in [place] of for her INRs. No bleeding issues other than some bruising of extremities.  She's had flu vaccine already  Past Medical History:  Diagnosis Date  . Heart murmur   . History of melanoma excision   . History of palpitations   . IBS (irritable bowel syndrome)   . INSOMNIA, CHRONIC 03/29/2009  . Mitral regurgitation    severe by TEE  . MVP (mitral valve prolapse)   . Osteopenia 07/2016   T score -2.1 FRAX 11%/2.1%  . Paroxysmal atrial fibrillation ([place]) 10/18/2014  . Peripheral vascular disease ([place]) 10/16   RT LEG dvt  . Pneumonia 11/17   hx  . PONV (postoperative nausea and vomiting)   . Prepatellar bursitis    RT KNEE  . S/P minimally invasive maze operation for atrial fibrillation 11/19/2015   Complete bilateral atrial lesion set using cryothermy and bipolar radiofrequency ablation with clipping of LA appendage via right mini thoracotomy approch  . S/P minimally invasive mitral valve repair 11/19/2015   Complex valvuloplasty including artificial Gore-tex neochord placement x 22, closure of cleft in posterior leaflet, and 34 mm Edwards Physio II ring annuloplasty via right mini thoracotomy approach  . Skin cancer of arm    Squamous   Past Surgical History:  Procedure Laterality Date  . ABDOMINAL HYSTERECTOMY  1980   WITH  BSO/FIBROIDS  . APPENDECTOMY  1969  . CARDIAC CATHETERIZATION N/A 10/21/2015   Procedure: Right/Left Heart Cath and Coronary Angiography;  Surgeon: Larey Dresser, MD;  Location: [facility];  Service: Cardiovascular;  Laterality: N/A;  . CLIPPING OF ATRIAL APPENDAGE N/A 11/19/2015   Procedure: CLIPPING OF ATRIAL APPENDAGE;  Surgeon: Rexene Alberts, MD;  Location: [facility];  Service: Open Heart Surgery;  Laterality: N/A;  . COLONOSCOPY    . DIAGNOSTIC LAPAROSCOPY     expl  . EXCISION MELANOMA WITH SENTINEL LYMPH NODE BIOPSY Right 10/02/2013   Procedure: WIDE EXCISION MELANOMA RIGHT ARM WITH SENTINEL LYMPH NODE BIOPSY;  Surgeon: Shann Medal, MD;  Location: [facility];  Service: General;  Laterality: Right;  . KNEE BURSECTOMY Right 03/05/2014   Procedure: RIGHT KNEE BURSECTOMY;  Surgeon: Gearlean Alf, MD;  Location: [facility];  Service: Orthopedics;  Laterality: Right;  . LOOP RECORDER INSERTION N/A 12/14/2016   Procedure: Loop Recorder Insertion;  Surgeon: Deboraha Sprang, MD;  Location: [facility];  Service: Cardiovascular;  Laterality: N/A;  . MINIMALLY INVASIVE MAZE PROCEDURE N/A 11/19/2015   Procedure: MINIMALLY INVASIVE MAZE PROCEDURE;  Surgeon: Rexene Alberts, MD;  Location: [facility];  Service: Open Heart Surgery;  Laterality: N/A;  . MITRAL VALVE REPAIR Right 11/19/2015   Procedure: MINIMALLY INVASIVE MITRAL VALVE REPAIR (MVR);  Surgeon: Rexene Alberts, MD;  Location: [facility];  Service: Open Heart Surgery;  Laterality: Right;  . SHOULDER ARTHROSCOPY  2010   right  . TEE WITHOUT CARDIOVERSION N/A 10/21/2015   Procedure: TRANSESOPHAGEAL ECHOCARDIOGRAM (TEE);  Surgeon: Larey Dresser, MD;  Location: [facility];  Service: Cardiovascular;  Laterality: N/A;  . TEE WITHOUT CARDIOVERSION N/A 11/19/2015   Procedure: TRANSESOPHAGEAL ECHOCARDIOGRAM (TEE);  Surgeon: Rexene Alberts, MD;  Location: [facility];  Service: Open Heart Surgery;  Laterality: N/A;  . THYROID CYST EXCISION   05-01-11   RIGHT  . TONSILLECTOMY  1980    reports that she quit smoking about 46 years ago. Her smoking use included Cigarettes. She has a 20.00 pack-year smoking history. She has never used smokeless tobacco. She reports that she drinks about 8.4 oz of alcohol per week . She reports that she does not use drugs. family history includes Alcohol abuse in her father; Breast cancer in her maternal grandmother; Diabetes type II in her mother; Heart disease in her mother; Ovarian cancer in her maternal aunt; Throat cancer in her father. Allergies  Allergen Reactions  . Inderal [Propranolol Hcl] Itching    SEVERE LIP(INSIDE ITCH) AND SKIN DRYNESS  . Toprol Xl [Metoprolol Tartrate] Other (See Comments)    Causes dizziness and fatigue  . Latex Itching    Sensitive to latex  . Tape Other (See Comments)    Very sensitive skin-     Review of Systems  Constitutional: Negative for fatigue.  Eyes: Negative for visual disturbance.  Respiratory: Negative for cough, chest tightness, shortness of breath and wheezing.   Cardiovascular: Negative for chest pain, palpitations and leg swelling.  Neurological: Negative for dizziness, seizures, syncope, weakness, light-headedness and headaches.       Objective:   Physical Exam  Constitutional: She appears well-developed and well-nourished.  Eyes: Pupils are equal, round, and reactive to light.  Neck: Neck supple. No JVD present. No thyromegaly present.  Cardiovascular: Normal rate and regular rhythm.  Exam reveals no gallop.   Pulmonary/Chest: Effort normal and breath sounds normal. No respiratory distress. She has no wheezes. She has no rales.  Musculoskeletal: She exhibits no edema.  Neurological: She is alert.       Assessment:     #1 history of atrial fibrillation  #2 chronic insomnia  #3 dyslipidemia    Plan:     -We did not recommend labs since she had these through cardiology in May -Flu vaccine already given -Refill Cardizem for  one year -Continue close follow-up regarding her Coumadin and she will reestablish in [place] when she gets there  [name] MD [facility]

## 2017-03-04 DIAGNOSIS — L570 Actinic keratosis: Secondary | ICD-10-CM | POA: Diagnosis not present

## 2017-03-05 ENCOUNTER — Ambulatory Visit (INDEPENDENT_AMBULATORY_CARE_PROVIDER_SITE_OTHER): Payer: Medicare Other | Admitting: Pharmacist

## 2017-03-05 ENCOUNTER — Telehealth: Payer: Self-pay | Admitting: Cardiology

## 2017-03-05 DIAGNOSIS — Z7901 Long term (current) use of anticoagulants: Secondary | ICD-10-CM

## 2017-03-05 DIAGNOSIS — Z5181 Encounter for therapeutic drug level monitoring: Secondary | ICD-10-CM | POA: Diagnosis not present

## 2017-03-05 DIAGNOSIS — Z8679 Personal history of other diseases of the circulatory system: Secondary | ICD-10-CM

## 2017-03-05 DIAGNOSIS — I480 Paroxysmal atrial fibrillation: Secondary | ICD-10-CM

## 2017-03-05 DIAGNOSIS — Z9889 Other specified postprocedural states: Secondary | ICD-10-CM | POA: Diagnosis not present

## 2017-03-05 LAB — POCT INR: INR: 3.6

## 2017-03-15 ENCOUNTER — Ambulatory Visit (INDEPENDENT_AMBULATORY_CARE_PROVIDER_SITE_OTHER): Payer: Medicare Other | Admitting: *Deleted

## 2017-03-15 DIAGNOSIS — I4891 Unspecified atrial fibrillation: Secondary | ICD-10-CM

## 2017-03-15 NOTE — Progress Notes (Signed)
Carelink Summary Report / Loop Recorder 

## 2017-03-16 LAB — CUP PACEART REMOTE DEVICE CHECK
Date Time Interrogation Session: 20180923153814
Implantable Pulse Generator Implant Date: 20180625

## 2017-03-18 ENCOUNTER — Telehealth: Payer: Self-pay | Admitting: Internal Medicine

## 2017-03-18 ENCOUNTER — Ambulatory Visit (INDEPENDENT_AMBULATORY_CARE_PROVIDER_SITE_OTHER): Payer: Medicare Other | Admitting: Interventional Cardiology

## 2017-03-18 DIAGNOSIS — Z7901 Long term (current) use of anticoagulants: Secondary | ICD-10-CM | POA: Diagnosis not present

## 2017-03-18 DIAGNOSIS — Z5181 Encounter for therapeutic drug level monitoring: Secondary | ICD-10-CM | POA: Diagnosis not present

## 2017-03-18 DIAGNOSIS — I341 Nonrheumatic mitral (valve) prolapse: Secondary | ICD-10-CM

## 2017-03-18 LAB — PROTIME-INR: INR: 1.7 — AB (ref 0.9–1.1)

## 2017-03-18 NOTE — Telephone Encounter (Signed)
See coumadin encounter of this date 

## 2017-03-18 NOTE — Telephone Encounter (Signed)
New message   Pt is returning call to [facility].

## 2017-04-01 DIAGNOSIS — Z7901 Long term (current) use of anticoagulants: Secondary | ICD-10-CM | POA: Diagnosis not present

## 2017-04-01 LAB — PROTIME-INR: INR: 2.3 — AB (ref 0.9–1.1)

## 2017-04-05 ENCOUNTER — Telehealth: Payer: Self-pay | Admitting: Cardiology

## 2017-04-05 ENCOUNTER — Ambulatory Visit (INDEPENDENT_AMBULATORY_CARE_PROVIDER_SITE_OTHER): Payer: Medicare Other

## 2017-04-05 DIAGNOSIS — Z5181 Encounter for therapeutic drug level monitoring: Secondary | ICD-10-CM

## 2017-04-05 DIAGNOSIS — Z7901 Long term (current) use of anticoagulants: Secondary | ICD-10-CM | POA: Diagnosis not present

## 2017-04-05 DIAGNOSIS — I341 Nonrheumatic mitral (valve) prolapse: Secondary | ICD-10-CM | POA: Diagnosis not present

## 2017-04-05 NOTE — Telephone Encounter (Signed)
New message    Patient states she will be in town 12/17 --> 1/5 wants appt during that timeframe.   Appointment made for 1/7. Patient request call back.

## 2017-04-05 NOTE — Telephone Encounter (Signed)
Returned call to pt, please refer to Anticoagulation Encounter.

## 2017-04-05 NOTE — Telephone Encounter (Signed)
Left a message for the pt to call back if any further assistance is needed.  Left a detailed message that Dr Meda Coffee was okay with her having to move her appt further out.

## 2017-04-05 NOTE — Telephone Encounter (Signed)
New message    Pt is returning call to coumadin clinic.

## 2017-04-07 NOTE — Telephone Encounter (Signed)
Left a message for the pt to call back.  2nd attempt at trying to make contact with the pt, with no return call back.

## 2017-04-12 ENCOUNTER — Other Ambulatory Visit: Payer: Self-pay | Admitting: Women's Health

## 2017-04-12 DIAGNOSIS — Z1231 Encounter for screening mammogram for malignant neoplasm of breast: Secondary | ICD-10-CM

## 2017-04-13 ENCOUNTER — Ambulatory Visit (INDEPENDENT_AMBULATORY_CARE_PROVIDER_SITE_OTHER): Payer: Medicare Other | Admitting: *Deleted

## 2017-04-13 DIAGNOSIS — I4891 Unspecified atrial fibrillation: Secondary | ICD-10-CM

## 2017-04-14 DIAGNOSIS — I480 Paroxysmal atrial fibrillation: Secondary | ICD-10-CM | POA: Diagnosis not present

## 2017-04-14 DIAGNOSIS — R911 Solitary pulmonary nodule: Secondary | ICD-10-CM | POA: Diagnosis not present

## 2017-04-14 DIAGNOSIS — Z7901 Long term (current) use of anticoagulants: Secondary | ICD-10-CM | POA: Diagnosis not present

## 2017-04-14 DIAGNOSIS — Z9889 Other specified postprocedural states: Secondary | ICD-10-CM | POA: Diagnosis not present

## 2017-04-14 DIAGNOSIS — N281 Cyst of kidney, acquired: Secondary | ICD-10-CM | POA: Diagnosis not present

## 2017-04-14 NOTE — Progress Notes (Signed)
Carelink Summary Report / Loop Recorder 

## 2017-04-15 LAB — CUP PACEART REMOTE DEVICE CHECK
Date Time Interrogation Session: 20181023153823
MDC IDC PG IMPLANT DT: 20180625

## 2017-04-21 DIAGNOSIS — Z7901 Long term (current) use of anticoagulants: Secondary | ICD-10-CM | POA: Diagnosis not present

## 2017-04-21 DIAGNOSIS — Z9889 Other specified postprocedural states: Secondary | ICD-10-CM | POA: Diagnosis not present

## 2017-04-21 DIAGNOSIS — I480 Paroxysmal atrial fibrillation: Secondary | ICD-10-CM | POA: Diagnosis not present

## 2017-04-21 DIAGNOSIS — Z5181 Encounter for therapeutic drug level monitoring: Secondary | ICD-10-CM | POA: Diagnosis not present

## 2017-04-21 DIAGNOSIS — E785 Hyperlipidemia, unspecified: Secondary | ICD-10-CM | POA: Diagnosis not present

## 2017-04-27 ENCOUNTER — Other Ambulatory Visit: Payer: Self-pay | Admitting: Family Medicine

## 2017-04-27 NOTE — Telephone Encounter (Signed)
Sent to PCP for approval.  

## 2017-04-27 NOTE — Telephone Encounter (Signed)
Refill with 3 additional refills   

## 2017-04-28 ENCOUNTER — Other Ambulatory Visit: Payer: Self-pay | Admitting: Family Medicine

## 2017-04-28 NOTE — Telephone Encounter (Signed)
Last office visit 03/01/17 and last refill 01/01/17.  Okay to fill?

## 2017-04-28 NOTE — Telephone Encounter (Signed)
Refill with 3 additional refills   

## 2017-04-30 ENCOUNTER — Other Ambulatory Visit: Payer: Self-pay | Admitting: Internal Medicine

## 2017-04-30 DIAGNOSIS — I480 Paroxysmal atrial fibrillation: Secondary | ICD-10-CM | POA: Diagnosis not present

## 2017-04-30 DIAGNOSIS — Z9889 Other specified postprocedural states: Secondary | ICD-10-CM | POA: Diagnosis not present

## 2017-04-30 DIAGNOSIS — Z7901 Long term (current) use of anticoagulants: Secondary | ICD-10-CM | POA: Diagnosis not present

## 2017-04-30 NOTE — Telephone Encounter (Signed)
Done. Rx sent and pt advised.

## 2017-05-03 DIAGNOSIS — R945 Abnormal results of liver function studies: Secondary | ICD-10-CM | POA: Diagnosis not present

## 2017-05-03 DIAGNOSIS — D709 Neutropenia, unspecified: Secondary | ICD-10-CM | POA: Diagnosis not present

## 2017-05-17 ENCOUNTER — Ambulatory Visit (INDEPENDENT_AMBULATORY_CARE_PROVIDER_SITE_OTHER): Payer: Medicare Other | Admitting: *Deleted

## 2017-05-17 DIAGNOSIS — I480 Paroxysmal atrial fibrillation: Secondary | ICD-10-CM | POA: Diagnosis not present

## 2017-05-17 NOTE — Progress Notes (Signed)
Carelink Summary Report / Loop Recorder 

## 2017-05-24 ENCOUNTER — Other Ambulatory Visit: Payer: Self-pay | Admitting: Cardiology

## 2017-05-31 DIAGNOSIS — I480 Paroxysmal atrial fibrillation: Secondary | ICD-10-CM | POA: Diagnosis not present

## 2017-05-31 DIAGNOSIS — D709 Neutropenia, unspecified: Secondary | ICD-10-CM | POA: Diagnosis not present

## 2017-05-31 DIAGNOSIS — E785 Hyperlipidemia, unspecified: Secondary | ICD-10-CM | POA: Diagnosis not present

## 2017-05-31 DIAGNOSIS — Z9889 Other specified postprocedural states: Secondary | ICD-10-CM | POA: Diagnosis not present

## 2017-06-01 LAB — CUP PACEART REMOTE DEVICE CHECK
Date Time Interrogation Session: 20181122153806
MDC IDC PG IMPLANT DT: 20180625

## 2017-06-02 ENCOUNTER — Encounter: Payer: Self-pay | Admitting: Physician Assistant

## 2017-06-07 DIAGNOSIS — L814 Other melanin hyperpigmentation: Secondary | ICD-10-CM | POA: Diagnosis not present

## 2017-06-07 DIAGNOSIS — D225 Melanocytic nevi of trunk: Secondary | ICD-10-CM | POA: Diagnosis not present

## 2017-06-07 DIAGNOSIS — L570 Actinic keratosis: Secondary | ICD-10-CM | POA: Diagnosis not present

## 2017-06-07 DIAGNOSIS — Z85820 Personal history of malignant melanoma of skin: Secondary | ICD-10-CM | POA: Diagnosis not present

## 2017-06-07 DIAGNOSIS — L821 Other seborrheic keratosis: Secondary | ICD-10-CM | POA: Diagnosis not present

## 2017-06-08 ENCOUNTER — Ambulatory Visit (INDEPENDENT_AMBULATORY_CARE_PROVIDER_SITE_OTHER): Payer: Medicare Other | Admitting: Pharmacist

## 2017-06-08 ENCOUNTER — Ambulatory Visit (INDEPENDENT_AMBULATORY_CARE_PROVIDER_SITE_OTHER): Payer: Medicare Other | Admitting: Physician Assistant

## 2017-06-08 ENCOUNTER — Encounter: Payer: Self-pay | Admitting: Physician Assistant

## 2017-06-08 ENCOUNTER — Encounter (HOSPITAL_COMMUNITY): Payer: Self-pay | Admitting: Nurse Practitioner

## 2017-06-08 ENCOUNTER — Ambulatory Visit (HOSPITAL_COMMUNITY)
Admission: RE | Admit: 2017-06-08 | Discharge: 2017-06-08 | Disposition: A | Discharge status: Home / Self Care | Payer: Medicare Other | Source: Ambulatory Visit | Attending: Nurse Practitioner | Admitting: Nurse Practitioner

## 2017-06-08 VITALS — BP 124/82 | HR 79 | Ht 68.0 in | Wt 145.0 lb

## 2017-06-08 VITALS — BP 134/82 | HR 88 | Ht 68.0 in | Wt 146.0 lb

## 2017-06-08 DIAGNOSIS — Z9889 Other specified postprocedural states: Secondary | ICD-10-CM | POA: Insufficient documentation

## 2017-06-08 DIAGNOSIS — I4891 Unspecified atrial fibrillation: Secondary | ICD-10-CM

## 2017-06-08 DIAGNOSIS — Z85820 Personal history of malignant melanoma of skin: Secondary | ICD-10-CM | POA: Insufficient documentation

## 2017-06-08 DIAGNOSIS — K589 Irritable bowel syndrome without diarrhea: Secondary | ICD-10-CM | POA: Insufficient documentation

## 2017-06-08 DIAGNOSIS — Z86718 Personal history of other venous thrombosis and embolism: Secondary | ICD-10-CM | POA: Insufficient documentation

## 2017-06-08 DIAGNOSIS — M8580 Other specified disorders of bone density and structure, unspecified site: Secondary | ICD-10-CM | POA: Insufficient documentation

## 2017-06-08 DIAGNOSIS — I739 Peripheral vascular disease, unspecified: Secondary | ICD-10-CM | POA: Insufficient documentation

## 2017-06-08 DIAGNOSIS — D709 Neutropenia, unspecified: Secondary | ICD-10-CM | POA: Diagnosis not present

## 2017-06-08 DIAGNOSIS — Z79899 Other long term (current) drug therapy: Secondary | ICD-10-CM | POA: Diagnosis not present

## 2017-06-08 DIAGNOSIS — Z7901 Long term (current) use of anticoagulants: Secondary | ICD-10-CM | POA: Insufficient documentation

## 2017-06-08 DIAGNOSIS — Z8679 Personal history of other diseases of the circulatory system: Secondary | ICD-10-CM | POA: Diagnosis not present

## 2017-06-08 DIAGNOSIS — Z5181 Encounter for therapeutic drug level monitoring: Secondary | ICD-10-CM

## 2017-06-08 DIAGNOSIS — Z87891 Personal history of nicotine dependence: Secondary | ICD-10-CM | POA: Diagnosis not present

## 2017-06-08 DIAGNOSIS — E7849 Other hyperlipidemia: Secondary | ICD-10-CM

## 2017-06-08 DIAGNOSIS — I480 Paroxysmal atrial fibrillation: Secondary | ICD-10-CM | POA: Diagnosis not present

## 2017-06-08 LAB — POCT INR: INR: 3.1

## 2017-06-08 NOTE — Progress Notes (Signed)
Cardiology Office Note    Date:  06/08/2017   ID:  [name], DOB 06-Aug-1945, MRN [id number]  PCP:  [name], MD  Cardiologist: Dr. Meda Coffee EPS Dr. Caryl Comes  No chief complaint on file.   History of Present Illness:  [name] is a 71 y.o. female with history of minimally invasive mitral valve repair and maze procedure 10/2015.  She saw Dr. Caryl Comes 10/2016 because she had not had symptomatic atrial fibrillation in 1 to come off her anticoagulation.  Echo 12/2015 moderate atrial enlargement normal LV function without MR. a loop recorder was inserted.  Patient lives in [place] and comes back for doctor's appointments.  She has been doing well walking 3 miles a day and playing tennis.  She denies any chest pain, palpitations, dizziness or presyncope.  She needs her Coumadin check.  She also has an appointment with Roderic Palau in the A. fib clinic today.  She said she had trouble with her loop recorder machine in November and is wondering if that is when the questionable reading came up.  She has a primary care in [place] who drew blood work and her white blood cell count has been low at 3.1 and 3.0.  Back in May here it was 3.4 in August 2017 it was 6.9.   Past Medical History:  Diagnosis Date  . Heart murmur   . History of melanoma excision   . History of palpitations   . IBS (irritable bowel syndrome)   . INSOMNIA, CHRONIC 03/29/2009  . Mitral regurgitation    severe by TEE  . MVP (mitral valve prolapse)   . Osteopenia 07/2016   T score -2.1 FRAX 11%/2.1%  . Paroxysmal atrial fibrillation ([place]) 10/18/2014  . Peripheral vascular disease ([place]) 10/16   RT LEG dvt  . Pneumonia 11/17   hx  . PONV (postoperative nausea and vomiting)   . Prepatellar bursitis    RT KNEE  . S/P minimally invasive maze operation for atrial fibrillation 11/19/2015   Complete bilateral atrial lesion set using cryothermy and bipolar radiofrequency ablation with clipping of LA appendage via right  mini thoracotomy approch  . S/P minimally invasive mitral valve repair 11/19/2015   Complex valvuloplasty including artificial Gore-tex neochord placement x 22, closure of cleft in posterior leaflet, and 34 mm Edwards Physio II ring annuloplasty via right mini thoracotomy approach  . Skin cancer of arm    Squamous    Past Surgical History:  Procedure Laterality Date  . ABDOMINAL HYSTERECTOMY  1980   WITH BSO/FIBROIDS  . APPENDECTOMY  1969  . CARDIAC CATHETERIZATION N/A 10/21/2015   Procedure: Right/Left Heart Cath and Coronary Angiography;  Surgeon: Larey Dresser, MD;  Location: [facility];  Service: Cardiovascular;  Laterality: N/A;  . CLIPPING OF ATRIAL APPENDAGE N/A 11/19/2015   Procedure: CLIPPING OF ATRIAL APPENDAGE;  Surgeon: Rexene Alberts, MD;  Location: [facility];  Service: Open Heart Surgery;  Laterality: N/A;  . COLONOSCOPY    . DIAGNOSTIC LAPAROSCOPY     expl  . EXCISION MELANOMA WITH SENTINEL LYMPH NODE BIOPSY Right 10/02/2013   Procedure: WIDE EXCISION MELANOMA RIGHT ARM WITH SENTINEL LYMPH NODE BIOPSY;  Surgeon: Shann Medal, MD;  Location: [facility];  Service: General;  Laterality: Right;  . KNEE BURSECTOMY Right 03/05/2014   Procedure: RIGHT KNEE BURSECTOMY;  Surgeon: Gearlean Alf, MD;  [facility];  Service: Orthopedics;  Laterality: Right;  . LOOP RECORDER INSERTION N/A 12/14/2016  Procedure: Loop Recorder Insertion;  Surgeon: Deboraha Sprang, MD;  Location: [facility];  Service: Cardiovascular;  Laterality: N/A;  . MINIMALLY INVASIVE MAZE PROCEDURE N/A 11/19/2015   Procedure: MINIMALLY INVASIVE MAZE PROCEDURE;  Surgeon: Rexene Alberts, MD;  Location: [facility];  Service: Open Heart Surgery;  Laterality: N/A;  . MITRAL VALVE REPAIR Right 11/19/2015   Procedure: MINIMALLY INVASIVE MITRAL VALVE REPAIR (MVR);  Surgeon: Rexene Alberts, MD;  Location: [facility];  Service: Open Heart Surgery;  Laterality: Right;  . SHOULDER ARTHROSCOPY  2010    right  . TEE WITHOUT CARDIOVERSION N/A 10/21/2015   Procedure: TRANSESOPHAGEAL ECHOCARDIOGRAM (TEE);  Surgeon: Larey Dresser, MD;  Location: [facility];  Service: Cardiovascular;  Laterality: N/A;  . TEE WITHOUT CARDIOVERSION N/A 11/19/2015   Procedure: TRANSESOPHAGEAL ECHOCARDIOGRAM (TEE);  Surgeon: Rexene Alberts, MD;  Location: [facility];  Service: Open Heart Surgery;  Laterality: N/A;  . THYROID CYST EXCISION  05-01-11   RIGHT  . TONSILLECTOMY  1980    Current Medications: Current Meds  Medication Sig  . atorvastatin (LIPITOR) 20 MG tablet TAKE 1 TABLET(20 MG) BY MOUTH DAILY  . desonide (DESOWEN) 0.05 % cream Apply 1 application topically daily as needed. For itching per patient  . diltiazem (CARDIZEM CD) 120 MG 24 hr capsule Take 1 capsule (120 mg total) by mouth daily.  Marland Kitchen ibuprofen (ADVIL,MOTRIN) 200 MG tablet Take 200 mg by mouth every 6 (six) hours as needed for moderate pain.  Marland Kitchen nystatin (MYCOSTATIN/NYSTOP) 100000 UNIT/GM POWD Apply 1 application topically daily as needed (skin).   Marland Kitchen warfarin (COUMADIN) 4 MG tablet Take 2-4 mg by mouth See admin instructions. 2 mg Sun Mon Wed Fri.  4 mg on Tues Thurs and Sat  . zolpidem (AMBIEN) 10 MG tablet TAKE 1 TABLET BY MOUTH EVERY NIGHT AT BEDTIME     Allergies:   Inderal [propranolol hcl]; Toprol xl [metoprolol tartrate]; Latex; and Tape   Social History   Socioeconomic History  . Marital status: Married    Spouse name: None  . Number of children: None  . Years of education: None  . Highest education level: None  Social Needs  . Financial resource strain: None  . Food insecurity - worry: None  . Food insecurity - inability: None  . Transportation needs - medical: None  . Transportation needs - non-medical: None  Occupational History  . Occupation: retired    Fish farm manager: UNEMPLOYED  Tobacco Use  . Smoking status: Former Smoker    Packs/day: 1.00    Years: 20.00    Pack years: 20.00    Types: Cigarettes    Last attempt to  quit: 08/11/1970    Years since quitting: 46.8  . Smokeless tobacco: Never Used  Substance and Sexual Activity  . Alcohol use: Yes    Alcohol/week: 8.4 oz    Types: 14 Glasses of wine per week  . Drug use: No  . Sexual activity: Yes    Birth control/protection: Surgical    Comment: intercourse age 73, sexual partners less than 5  Other Topics Concern  . None  Social History Narrative  . None     Family History:  The patient's family history includes Alcohol abuse in her father; Breast cancer in her maternal grandmother; Diabetes type II in her mother; Heart disease in her mother; Ovarian cancer in her maternal aunt; Throat cancer in her father.   ROS:   Please see the history of present illness.  Review of Systems  Constitution: Positive for weight gain.  HENT: Negative.   Eyes: Negative.   Cardiovascular: Negative.   Respiratory: Negative.   Hematologic/Lymphatic: Negative.   Musculoskeletal: Negative.  Negative for joint pain.  Gastrointestinal: Negative.   Genitourinary: Negative.   Neurological: Negative.    All other systems reviewed and are negative.   PHYSICAL EXAM:   VS:  Ht 5\' 8"  (1.727 m)   Wt 146 lb (66.2 kg)   LMP 06/22/1977   BMI 22.20 kg/m   Physical Exam  GEN: Well nourished, well developed, in no acute distress  Neck: no JVD, carotid bruits, or masses Cardiac:RRR; no murmurs, rubs, or gallops  Respiratory:  clear to auscultation bilaterally, normal work of breathing GI: soft, nontender, nondistended, + BS Ext: without cyanosis, clubbing, or edema, Good distal pulses bilaterally Neuro:  Alert and Oriented x 3 Psych: euthymic mood, full affect  Wt Readings from Last 3 Encounters:  06/08/17 146 lb (66.2 kg)  03/01/17 144 lb 8 oz (65.5 kg)  02/01/17 143 lb (64.9 kg)      Studies/Labs Reviewed:   EKG:  EKG is  ordered today.  The ekg ordered today demonstrates normal sinus rhythm  Recent Labs: 10/23/2016: ALT 22; BUN 18; Creatinine, Ser 0.91;  Hemoglobin 13.9; Platelets 272; Potassium 4.9; Sodium 143; TSH 2.630   Lipid Panel    Component Value Date/Time   CHOL 165 10/23/2016 0733   TRIG 97 10/23/2016 0733   HDL 91 10/23/2016 0733   CHOLHDL 1.8 10/23/2016 0733   CHOLHDL 2 10/19/2013 0952   VLDL 13.2 10/19/2013 0952   LDLCALC 55 10/23/2016 0733   LDLDIRECT 79.2 11/11/2009 1039    Additional studies/ records that were reviewed today include:  TTE: 7/17 Left ventricle: The cavity size was normal. Systolic function was   normal. The estimated ejection fraction was in the range of 55%   to 60%. Wall motion was normal; there were no regional wall   motion abnormalities. The study is not technically sufficient to   allow evaluation of LV diastolic function. - Aortic valve: There was trivial regurgitation. - Left atrium: The atrium was mildly dilated. - Right ventricle: The cavity size was mildly dilated. Wall   thickness was normal. - Right atrium: The atrium was moderately dilated. - Tricuspid valve: There was moderate regurgitation. - Pulmonary arteries: Systolic pressure was within the normal   range. PA peak pressure: 29 mm Hg (S). - Inferior vena cava: The vessel was normal in size. - Pericardium, extracardiac: A trivial pericardial effusion was   identified.   Impressions:   - When compared to the prior study from 10/21/2015 mitral valve is   post repair. There is residual regurgitation and normal   transmitral gradients.   LHC: 10/2015  1. Normal left and right heart filling pressures.  2. Nonobstructive CAD.  3. Normal EF, mitral regurgitation does appear as marked by LV-gram as by TEE, likely due to eccentricity.          ASSESSMENT:    1. S/P minimally invasive mitral valve repair + maze procedure   2. Paroxysmal atrial fibrillation (HCC)   3. Other hyperlipidemia   4. Long term (current) use of anticoagulants [Z79.01]      PLAN:  In order of problems listed above:  Status post minimally  invasive mitral valve repair and maze procedure doing well.  PAF currently with a loop recorder because the patient wants to come off long-term anticoagulation.  November tracing questions possible  A. fib but it is not clear.  No other evidence of A. fib on any other tracings.  Hyperlipidemia on Lipitor  Long-term anticoagulation with Coumadin will check INR today.  Neutropenia patient's white blood cell count is trending down.  Advised to follow-up with primary care for any further workup while living in [place].  Medication Adjustments/Labs and Tests Ordered: Current medicines are reviewed at length with the patient today.  Concerns regarding medicines are outlined above.  Medication changes, Labs and Tests ordered today are listed in the Patient Instructions below. There are no Patient Instructions on file for this visit.   Sumner Boast, PA-C  06/08/2017 10:25 AM    [facility] [address], [place], [place]  [address] Phone: [phone]; Fax: [phone]

## 2017-06-08 NOTE — Progress Notes (Signed)
Primary Care Physician: [name], MD Referring Physician: Dr. Braxton Feathers is a 71 y.o. female for evaluation in the afib clinic. She is status post minimally invasive mitral valve repair and Maze procedure on 11/19/2015 for severe symptomatic primary mitral regurgitation and recurrent paroxysmal atrial fibrillation. She is now about 5 months s/p surgery and reports no symptoms that would indicate return of afib. She has been off amiodarone for several months. She has finished cardiac rehab and continues to exercise on her own at a local gym and has returned to playing tennis. She continues on warfarin.  She is living in [place] for most of the year and is in [facility] for doctor visits. She is in the afib clinic for long term surveillance of afib after maze procedure. She reports that she is feeling very well. She walks 3 miles a day.  No shortness of breath. No awareness of afib. By [facility] report in October, she had 2 mins of possible afib. Continues on warfarin.  Today, she denies symptoms of palpitations, chest pain, shortness of breath, orthopnea, PND, lower extremity edema, dizziness, presyncope, syncope, or neurologic sequela. The patient is tolerating medications without difficulties and is otherwise without complaint today.   Past Medical History:  Diagnosis Date  . Heart murmur   . History of melanoma excision   . History of palpitations   . IBS (irritable bowel syndrome)   . INSOMNIA, CHRONIC 03/29/2009  . Mitral regurgitation    severe by TEE  . MVP (mitral valve prolapse)   . Osteopenia 07/2016   T score -2.1 FRAX 11%/2.1%  . Paroxysmal atrial fibrillation ([place]) 10/18/2014  . Peripheral vascular disease ([place]) 10/16   RT LEG dvt  . Pneumonia 11/17   hx  . PONV (postoperative nausea and vomiting)   . Prepatellar bursitis    RT KNEE  . S/P minimally invasive maze operation for atrial fibrillation 11/19/2015   Complete bilateral atrial lesion set using  cryothermy and bipolar radiofrequency ablation with clipping of LA appendage via right mini thoracotomy approch  . S/P minimally invasive mitral valve repair 11/19/2015   Complex valvuloplasty including artificial Gore-tex neochord placement x 22, closure of cleft in posterior leaflet, and 34 mm Edwards Physio II ring annuloplasty via right mini thoracotomy approach  . Skin cancer of arm    Squamous   Past Surgical History:  Procedure Laterality Date  . ABDOMINAL HYSTERECTOMY  1980   WITH BSO/FIBROIDS  . APPENDECTOMY  1969  . CARDIAC CATHETERIZATION N/A 10/21/2015   Procedure: Right/Left Heart Cath and Coronary Angiography;  Surgeon: Larey Dresser, MD;  Location: [facility];  Service: Cardiovascular;  Laterality: N/A;  . CLIPPING OF ATRIAL APPENDAGE N/A 11/19/2015   Procedure: CLIPPING OF ATRIAL APPENDAGE;  Surgeon: Rexene Alberts, MD;  Location: [facility];  Service: Open Heart Surgery;  Laterality: N/A;  . COLONOSCOPY    . DIAGNOSTIC LAPAROSCOPY     expl  . EXCISION MELANOMA WITH SENTINEL LYMPH NODE BIOPSY Right 10/02/2013   Procedure: WIDE EXCISION MELANOMA RIGHT ARM WITH SENTINEL LYMPH NODE BIOPSY;  Surgeon: Shann Medal, MD;  Location: [facility];  Service: General;  Laterality: Right;  . KNEE BURSECTOMY Right 03/05/2014   Procedure: RIGHT KNEE BURSECTOMY;  Surgeon: Gearlean Alf, MD;  [facility];  Service: Orthopedics;  Laterality: Right;  . LOOP RECORDER INSERTION N/A 12/14/2016   Procedure: Loop Recorder Insertion;  Surgeon: Deboraha Sprang, MD;  Location: [facility];  Service: Cardiovascular;  Laterality: N/A;  . MINIMALLY INVASIVE MAZE PROCEDURE N/A 11/19/2015   Procedure: MINIMALLY INVASIVE MAZE PROCEDURE;  Surgeon: Rexene Alberts, MD;  Location: [facility];  Service: Open Heart Surgery;  Laterality: N/A;  . MITRAL VALVE REPAIR Right 11/19/2015   Procedure: MINIMALLY INVASIVE MITRAL VALVE REPAIR (MVR);  Surgeon: Rexene Alberts, MD;  Location: [facility];   Service: Open Heart Surgery;  Laterality: Right;  . SHOULDER ARTHROSCOPY  2010   right  . TEE WITHOUT CARDIOVERSION N/A 10/21/2015   Procedure: TRANSESOPHAGEAL ECHOCARDIOGRAM (TEE);  Surgeon: Larey Dresser, MD;  Location: [facility];  Service: Cardiovascular;  Laterality: N/A;  . TEE WITHOUT CARDIOVERSION N/A 11/19/2015   Procedure: TRANSESOPHAGEAL ECHOCARDIOGRAM (TEE);  Surgeon: Rexene Alberts, MD;  Location: [facility];  Service: Open Heart Surgery;  Laterality: N/A;  . THYROID CYST EXCISION  05-01-11   RIGHT  . TONSILLECTOMY  1980    Current Outpatient Medications  Medication Sig Dispense Refill  . atorvastatin (LIPITOR) 20 MG tablet TAKE 1 TABLET(20 MG) BY MOUTH DAILY 90 tablet 0  . desonide (DESOWEN) 0.05 % cream Apply 1 application topically daily as needed. For itching per patient    . diltiazem (CARDIZEM CD) 120 MG 24 hr capsule Take 1 capsule (120 mg total) by mouth daily. 90 capsule 3  . ibuprofen (ADVIL,MOTRIN) 200 MG tablet Take 200 mg by mouth every 6 (six) hours as needed for moderate pain.    Marland Kitchen nystatin (MYCOSTATIN/NYSTOP) 100000 UNIT/GM POWD Apply 1 application topically daily as needed (skin).   2  . warfarin (COUMADIN) 4 MG tablet Take 2-4 mg by mouth See admin instructions. 2 mg Sun Mon Wed Fri.  4 mg on Tues Thurs and Sat    . zolpidem (AMBIEN) 10 MG tablet TAKE 1 TABLET BY MOUTH EVERY NIGHT AT BEDTIME 30 tablet 3   No current facility-administered medications for this encounter.     Allergies  Allergen Reactions  . Inderal [Propranolol Hcl] Itching    SEVERE LIP(INSIDE ITCH) AND SKIN DRYNESS  . Toprol Xl [Metoprolol Tartrate] Other (See Comments)    Causes dizziness and fatigue  . Latex Itching    Sensitive to latex  . Tape Other (See Comments)    Very sensitive skin-    Social History   Socioeconomic History  . Marital status: Married    Spouse name: Not on file  . Number of children: Not on file  . Years of education: Not on file  . Highest education  level: Not on file  Social Needs  . Financial resource strain: Not on file  . Food insecurity - worry: Not on file  . Food insecurity - inability: Not on file  . Transportation needs - medical: Not on file  . Transportation needs - non-medical: Not on file  Occupational History  . Occupation: retired    Fish farm manager: UNEMPLOYED  Tobacco Use  . Smoking status: Former Smoker    Packs/day: 1.00    Years: 20.00    Pack years: 20.00    Types: Cigarettes    Last attempt to quit: 08/11/1970    Years since quitting: 46.8  . Smokeless tobacco: Never Used  Substance and Sexual Activity  . Alcohol use: Yes    Alcohol/week: 8.4 oz    Types: 14 Glasses of wine per week  . Drug use: No  . Sexual activity: Yes    Birth control/protection: Surgical    Comment: intercourse age 72,  sexual partners less than 5  Other Topics Concern  . Not on file  Social History Narrative  . Not on file    Family History  Problem Relation Age of Onset  . Diabetes type II Mother   . Heart disease Mother        age 47  . Alcohol abuse Father   . Throat cancer Father   . Ovarian cancer Maternal Aunt   . Breast cancer Maternal Grandmother     ROS- All systems are reviewed and negative except as per the HPI above  Physical Exam: Vitals:   06/08/17 1505  BP: 124/82  Pulse: 79  Weight: 145 lb (65.8 kg)  Height: 5\' 8"  (1.727 m)    GEN- The patient is well appearing, alert and oriented x 3 today.   Head- normocephalic, atraumatic Eyes-  Sclera clear, conjunctiva pink Ears- hearing intact Oropharynx- clear Neck- supple, no JVP Lymph- no cervical lymphadenopathy Lungs- Clear to ausculation bilaterally, normal work of breathing Heart- Regular rate and rhythm, no murmurs, rubs or gallops, PMI not laterally displaced GI- soft, NT, ND, + BS Extremities- no clubbing, cyanosis, or edema MS- no significant deformity or atrophy Skin- no rash or lesion Psych- euthymic mood, full affect Neuro- strength and  sensation are intact  EKG-NSR at 91 bpm, LAD, pr int 164 ms, qrs int 86 ms, qtc 450 ms Epic records reviewed Dr. Guy Sandifer notes reviewed  Assessment and Plan: 1. S/p  minimally invasive mitral valve repair and Maze procedure on 11/19/2015 for severe symptomatic primary mitral regurgitation and recurrent paroxysmal atrial fibrillation.  Is maintaining SR Continuing  normal activity without symptoms Continue metoprolol Continue on warfarin     F/u afib clinic in one year for long term surveillance of afib s/p maze procedure   Butch Penny C. Jennetta Flood, [facility] [address] [place], [place] [address] [phone]

## 2017-06-08 NOTE — Patient Instructions (Signed)
Description   No Coumadin today then continue taking 1/2 tablet daily except 1 tablet on Sundays and Thursdays. Recheck in 2 weeks. Please let us know when you are going back to [facility] [phone].

## 2017-06-08 NOTE — Patient Instructions (Signed)
Medication Instructions:  Your physician recommends that you continue on your current medications as directed. Please refer to the Current Medication list given to you today.   Labwork: None ordered today  Testing/Procedures: None ordered today  Follow-Up: Your physician recommends that you schedule a follow-up appointment on November 23, 2017 @ 1:45 pm with Dr. Caryl Comes   Any Other Special Instructions Will Be Listed Below (If Applicable).     If you need a refill on your cardiac medications before your next appointment, please call your pharmacy.

## 2017-06-09 ENCOUNTER — Telehealth: Payer: Self-pay | Admitting: Pharmacist

## 2017-06-09 DIAGNOSIS — I480 Paroxysmal atrial fibrillation: Secondary | ICD-10-CM

## 2017-06-09 NOTE — Telephone Encounter (Addendum)
Erroneous encounter - please disregard.

## 2017-06-14 ENCOUNTER — Ambulatory Visit (INDEPENDENT_AMBULATORY_CARE_PROVIDER_SITE_OTHER): Payer: Medicare Other | Admitting: *Deleted

## 2017-06-14 DIAGNOSIS — I480 Paroxysmal atrial fibrillation: Secondary | ICD-10-CM | POA: Diagnosis not present

## 2017-06-16 NOTE — Progress Notes (Signed)
Carelink Summary Report / Loop Recorder 

## 2017-06-23 ENCOUNTER — Ambulatory Visit (INDEPENDENT_AMBULATORY_CARE_PROVIDER_SITE_OTHER): Payer: Medicare Other

## 2017-06-23 DIAGNOSIS — I480 Paroxysmal atrial fibrillation: Secondary | ICD-10-CM | POA: Diagnosis not present

## 2017-06-23 DIAGNOSIS — Z7901 Long term (current) use of anticoagulants: Secondary | ICD-10-CM

## 2017-06-23 DIAGNOSIS — Z9889 Other specified postprocedural states: Secondary | ICD-10-CM | POA: Diagnosis not present

## 2017-06-23 DIAGNOSIS — Z5181 Encounter for therapeutic drug level monitoring: Secondary | ICD-10-CM

## 2017-06-23 DIAGNOSIS — Z8679 Personal history of other diseases of the circulatory system: Secondary | ICD-10-CM

## 2017-06-23 LAB — POCT INR: INR: 2.1

## 2017-06-23 NOTE — Patient Instructions (Signed)
Continue on same dosage 1/2 tablet daily except 1 tablet on Sundays and Thursdays. Recheck in 3 weeks, call for appointment. Please let us know when you are going back to [facility] [phone].

## 2017-06-25 LAB — CUP PACEART REMOTE DEVICE CHECK
MDC IDC PG IMPLANT DT: 20180625
MDC IDC SESS DTM: 20181222153704

## 2017-06-28 ENCOUNTER — Ambulatory Visit: Payer: Medicare Other | Admitting: Cardiology

## 2017-06-29 DIAGNOSIS — H16223 Keratoconjunctivitis sicca, not specified as Sjogren's, bilateral: Secondary | ICD-10-CM | POA: Diagnosis not present

## 2017-07-12 ENCOUNTER — Ambulatory Visit (INDEPENDENT_AMBULATORY_CARE_PROVIDER_SITE_OTHER): Payer: Medicare Other | Admitting: *Deleted

## 2017-07-12 DIAGNOSIS — I480 Paroxysmal atrial fibrillation: Secondary | ICD-10-CM

## 2017-07-13 NOTE — Progress Notes (Signed)
Carelink Summary Report / Loop Recorder 

## 2017-07-14 DIAGNOSIS — I480 Paroxysmal atrial fibrillation: Secondary | ICD-10-CM | POA: Diagnosis not present

## 2017-07-19 LAB — CUP PACEART REMOTE DEVICE CHECK
Date Time Interrogation Session: 20190121163746
Implantable Pulse Generator Implant Date: 20180625

## 2017-07-27 ENCOUNTER — Encounter: Payer: Medicare Other | Admitting: Women's Health

## 2017-07-27 DIAGNOSIS — Z0289 Encounter for other administrative examinations: Secondary | ICD-10-CM

## 2017-08-04 DIAGNOSIS — I480 Paroxysmal atrial fibrillation: Secondary | ICD-10-CM | POA: Diagnosis not present

## 2017-08-04 DIAGNOSIS — Z1231 Encounter for screening mammogram for malignant neoplasm of breast: Secondary | ICD-10-CM | POA: Diagnosis not present

## 2017-08-11 ENCOUNTER — Ambulatory Visit (INDEPENDENT_AMBULATORY_CARE_PROVIDER_SITE_OTHER): Payer: Medicare Other | Admitting: *Deleted

## 2017-08-11 DIAGNOSIS — I480 Paroxysmal atrial fibrillation: Secondary | ICD-10-CM | POA: Diagnosis not present

## 2017-08-12 NOTE — Progress Notes (Signed)
Carelink Summary Report / Loop Recorder 

## 2017-08-23 DIAGNOSIS — I480 Paroxysmal atrial fibrillation: Secondary | ICD-10-CM | POA: Diagnosis not present

## 2017-08-23 DIAGNOSIS — F5101 Primary insomnia: Secondary | ICD-10-CM | POA: Diagnosis not present

## 2017-08-23 DIAGNOSIS — Z9889 Other specified postprocedural states: Secondary | ICD-10-CM | POA: Diagnosis not present

## 2017-08-23 DIAGNOSIS — N644 Mastodynia: Secondary | ICD-10-CM | POA: Diagnosis not present

## 2017-08-23 DIAGNOSIS — Z6822 Body mass index (BMI) 22.0-22.9, adult: Secondary | ICD-10-CM | POA: Diagnosis not present

## 2017-08-23 DIAGNOSIS — D709 Neutropenia, unspecified: Secondary | ICD-10-CM | POA: Diagnosis not present

## 2017-08-23 DIAGNOSIS — Z7901 Long term (current) use of anticoagulants: Secondary | ICD-10-CM | POA: Diagnosis not present

## 2017-08-27 DIAGNOSIS — B370 Candidal stomatitis: Secondary | ICD-10-CM | POA: Diagnosis not present

## 2017-08-27 DIAGNOSIS — Z6821 Body mass index (BMI) 21.0-21.9, adult: Secondary | ICD-10-CM | POA: Diagnosis not present

## 2017-08-27 DIAGNOSIS — J0100 Acute maxillary sinusitis, unspecified: Secondary | ICD-10-CM | POA: Diagnosis not present

## 2017-08-30 DIAGNOSIS — I480 Paroxysmal atrial fibrillation: Secondary | ICD-10-CM | POA: Diagnosis not present

## 2017-09-01 DIAGNOSIS — L814 Other melanin hyperpigmentation: Secondary | ICD-10-CM | POA: Diagnosis not present

## 2017-09-01 DIAGNOSIS — L821 Other seborrheic keratosis: Secondary | ICD-10-CM | POA: Diagnosis not present

## 2017-09-01 DIAGNOSIS — D225 Melanocytic nevi of trunk: Secondary | ICD-10-CM | POA: Diagnosis not present

## 2017-09-01 DIAGNOSIS — L820 Inflamed seborrheic keratosis: Secondary | ICD-10-CM | POA: Diagnosis not present

## 2017-09-01 DIAGNOSIS — S81811A Laceration without foreign body, right lower leg, initial encounter: Secondary | ICD-10-CM | POA: Diagnosis not present

## 2017-09-01 DIAGNOSIS — H5340 Unspecified visual field defects: Secondary | ICD-10-CM | POA: Diagnosis not present

## 2017-09-01 DIAGNOSIS — L570 Actinic keratosis: Secondary | ICD-10-CM | POA: Diagnosis not present

## 2017-09-01 DIAGNOSIS — Z08 Encounter for follow-up examination after completed treatment for malignant neoplasm: Secondary | ICD-10-CM | POA: Diagnosis not present

## 2017-09-01 DIAGNOSIS — Z85820 Personal history of malignant melanoma of skin: Secondary | ICD-10-CM | POA: Diagnosis not present

## 2017-09-01 DIAGNOSIS — L538 Other specified erythematous conditions: Secondary | ICD-10-CM | POA: Diagnosis not present

## 2017-09-09 DIAGNOSIS — Z85828 Personal history of other malignant neoplasm of skin: Secondary | ICD-10-CM | POA: Diagnosis not present

## 2017-09-09 DIAGNOSIS — I480 Paroxysmal atrial fibrillation: Secondary | ICD-10-CM | POA: Diagnosis not present

## 2017-09-09 DIAGNOSIS — Z7901 Long term (current) use of anticoagulants: Secondary | ICD-10-CM | POA: Diagnosis not present

## 2017-09-09 DIAGNOSIS — Z87891 Personal history of nicotine dependence: Secondary | ICD-10-CM | POA: Diagnosis not present

## 2017-09-09 DIAGNOSIS — R911 Solitary pulmonary nodule: Secondary | ICD-10-CM | POA: Diagnosis not present

## 2017-09-09 DIAGNOSIS — Z9889 Other specified postprocedural states: Secondary | ICD-10-CM | POA: Diagnosis not present

## 2017-09-13 LAB — CUP PACEART REMOTE DEVICE CHECK
Date Time Interrogation Session: 20190221144242
Implantable Pulse Generator Implant Date: 20180625

## 2017-09-14 ENCOUNTER — Ambulatory Visit (INDEPENDENT_AMBULATORY_CARE_PROVIDER_SITE_OTHER): Payer: Medicare Other | Admitting: *Deleted

## 2017-09-14 DIAGNOSIS — I480 Paroxysmal atrial fibrillation: Secondary | ICD-10-CM | POA: Diagnosis not present

## 2017-09-14 NOTE — Progress Notes (Signed)
Carelink Summary Report / Loop Recorder 

## 2017-09-22 DIAGNOSIS — L218 Other seborrheic dermatitis: Secondary | ICD-10-CM | POA: Diagnosis not present

## 2017-09-22 DIAGNOSIS — Z7901 Long term (current) use of anticoagulants: Secondary | ICD-10-CM | POA: Diagnosis not present

## 2017-09-22 DIAGNOSIS — S81811A Laceration without foreign body, right lower leg, initial encounter: Secondary | ICD-10-CM | POA: Diagnosis not present

## 2017-09-29 DIAGNOSIS — Z7901 Long term (current) use of anticoagulants: Secondary | ICD-10-CM | POA: Diagnosis not present

## 2017-09-30 DIAGNOSIS — I480 Paroxysmal atrial fibrillation: Secondary | ICD-10-CM | POA: Diagnosis not present

## 2017-09-30 DIAGNOSIS — J0100 Acute maxillary sinusitis, unspecified: Secondary | ICD-10-CM | POA: Diagnosis not present

## 2017-09-30 DIAGNOSIS — Z6821 Body mass index (BMI) 21.0-21.9, adult: Secondary | ICD-10-CM | POA: Diagnosis not present

## 2017-10-04 DIAGNOSIS — Z7901 Long term (current) use of anticoagulants: Secondary | ICD-10-CM | POA: Diagnosis not present

## 2017-10-05 DIAGNOSIS — S81811A Laceration without foreign body, right lower leg, initial encounter: Secondary | ICD-10-CM | POA: Diagnosis not present

## 2017-10-18 ENCOUNTER — Ambulatory Visit (INDEPENDENT_AMBULATORY_CARE_PROVIDER_SITE_OTHER): Payer: Medicare Other | Admitting: *Deleted

## 2017-10-18 DIAGNOSIS — I480 Paroxysmal atrial fibrillation: Secondary | ICD-10-CM

## 2017-10-18 NOTE — Progress Notes (Signed)
Carelink Summary Report / Loop Recorder 

## 2017-10-19 ENCOUNTER — Ambulatory Visit (INDEPENDENT_AMBULATORY_CARE_PROVIDER_SITE_OTHER): Payer: Medicare Other | Admitting: *Deleted

## 2017-10-19 DIAGNOSIS — I480 Paroxysmal atrial fibrillation: Secondary | ICD-10-CM | POA: Diagnosis not present

## 2017-10-19 DIAGNOSIS — Z7901 Long term (current) use of anticoagulants: Secondary | ICD-10-CM

## 2017-10-19 DIAGNOSIS — Z8679 Personal history of other diseases of the circulatory system: Secondary | ICD-10-CM | POA: Diagnosis not present

## 2017-10-19 DIAGNOSIS — Z9889 Other specified postprocedural states: Secondary | ICD-10-CM | POA: Diagnosis not present

## 2017-10-19 DIAGNOSIS — Z5181 Encounter for therapeutic drug level monitoring: Secondary | ICD-10-CM | POA: Diagnosis not present

## 2017-10-19 LAB — POCT INR: INR: 5.1

## 2017-10-19 NOTE — Patient Instructions (Signed)
Description   Skip today and tomorrow's dose, then resume taking 1/2 tablet daily except 1 tablet on Sundays and Thursdays. Recheck in one week. Call us with any medication changes or concerns # [phone].

## 2017-10-22 ENCOUNTER — Ambulatory Visit (INDEPENDENT_AMBULATORY_CARE_PROVIDER_SITE_OTHER): Payer: Medicare Other | Admitting: Family Medicine

## 2017-10-22 ENCOUNTER — Encounter: Payer: Self-pay | Admitting: Family Medicine

## 2017-10-22 VITALS — BP 130/80 | HR 77 | Temp 97.7°F | Wt 146.9 lb

## 2017-10-22 DIAGNOSIS — R0981 Nasal congestion: Secondary | ICD-10-CM

## 2017-10-22 LAB — CUP PACEART REMOTE DEVICE CHECK
Date Time Interrogation Session: 20190326153753
Implantable Pulse Generator Implant Date: 20180625

## 2017-10-22 NOTE — Progress Notes (Signed)
Subjective:     Patient ID: [name], female   DOB: [birth year], 72 y.o.   MRN: [id number]  HPI Patient seen with some sinus congestion past several days. She states that near opening of her right nostril she had small "sore" . Is actually smaller in size today. She denied any nosebleed. No drainage. She uses Flonase and saline irrigation for her sinus at baseline.  Denies any cough. No upper teeth pain.  Past Medical History:  Diagnosis Date  . Heart murmur   . History of melanoma excision   . History of palpitations   . IBS (irritable bowel syndrome)   . INSOMNIA, CHRONIC 03/29/2009  . Mitral regurgitation    severe by TEE  . MVP (mitral valve prolapse)   . Osteopenia 07/2016   T score -2.1 FRAX 11%/2.1%  . Paroxysmal atrial fibrillation ([place]) 10/18/2014  . Peripheral vascular disease ([place]) 10/16   RT LEG dvt  . Pneumonia 11/17   hx  . PONV (postoperative nausea and vomiting)   . Prepatellar bursitis    RT KNEE  . S/P minimally invasive maze operation for atrial fibrillation 11/19/2015   Complete bilateral atrial lesion set using cryothermy and bipolar radiofrequency ablation with clipping of LA appendage via right mini thoracotomy approch  . S/P minimally invasive mitral valve repair 11/19/2015   Complex valvuloplasty including artificial Gore-tex neochord placement x 22, closure of cleft in posterior leaflet, and 34 mm Edwards Physio II ring annuloplasty via right mini thoracotomy approach  . Skin cancer of arm    Squamous   Past Surgical History:  Procedure Laterality Date  . ABDOMINAL HYSTERECTOMY  1980   WITH BSO/FIBROIDS  . APPENDECTOMY  1969  . CARDIAC CATHETERIZATION N/A 10/21/2015   Procedure: Right/Left Heart Cath and Coronary Angiography;  Surgeon: Larey Dresser, MD;  Location: [facility];  Service: Cardiovascular;  Laterality: N/A;  . CLIPPING OF ATRIAL APPENDAGE N/A 11/19/2015   Procedure: CLIPPING OF ATRIAL APPENDAGE;  Surgeon: Rexene Alberts, MD;   Location: [facility];  Service: Open Heart Surgery;  Laterality: N/A;  . COLONOSCOPY    . DIAGNOSTIC LAPAROSCOPY     expl  . EXCISION MELANOMA WITH SENTINEL LYMPH NODE BIOPSY Right 10/02/2013   Procedure: WIDE EXCISION MELANOMA RIGHT ARM WITH SENTINEL LYMPH NODE BIOPSY;  Surgeon: Shann Medal, MD;  Location: [place];  Service: General;  Laterality: Right;  . KNEE BURSECTOMY Right 03/05/2014   Procedure: RIGHT KNEE BURSECTOMY;  Surgeon: Gearlean Alf, MD;  [facility];  Service: Orthopedics;  Laterality: Right;  . LOOP RECORDER INSERTION N/A 12/14/2016   Procedure: Loop Recorder Insertion;  Surgeon: Deboraha Sprang, MD;  Location: [facility];  Service: Cardiovascular;  Laterality: N/A;  . MINIMALLY INVASIVE MAZE PROCEDURE N/A 11/19/2015   Procedure: MINIMALLY INVASIVE MAZE PROCEDURE;  Surgeon: Rexene Alberts, MD;  Location: [facility];  Service: Open Heart Surgery;  Laterality: N/A;  . MITRAL VALVE REPAIR Right 11/19/2015   Procedure: MINIMALLY INVASIVE MITRAL VALVE REPAIR (MVR);  Surgeon: Rexene Alberts, MD;  Location: [facility];  Service: Open Heart Surgery;  Laterality: Right;  . SHOULDER ARTHROSCOPY  2010   right  . TEE WITHOUT CARDIOVERSION N/A 10/21/2015   Procedure: TRANSESOPHAGEAL ECHOCARDIOGRAM (TEE);  Surgeon: Larey Dresser, MD;  Location: [facility];  Service: Cardiovascular;  Laterality: N/A;  . TEE WITHOUT CARDIOVERSION N/A 11/19/2015   Procedure: TRANSESOPHAGEAL ECHOCARDIOGRAM (TEE);  Surgeon: Rexene Alberts, MD;  Location:  [facility];  Service: Open Heart Surgery;  Laterality: N/A;  . THYROID CYST EXCISION  05-01-11   RIGHT  . TONSILLECTOMY  1980    reports that she quit smoking about 47 years ago. Her smoking use included cigarettes. She has a 20.00 pack-year smoking history. She has never used smokeless tobacco. She reports that she drinks about 8.4 oz of alcohol per week. She reports that she does not use drugs. family history includes Alcohol abuse in her  father; Breast cancer in her maternal grandmother; Diabetes type II in her mother; Heart disease in her mother; Ovarian cancer in her maternal aunt; Throat cancer in her father. Allergies  Allergen Reactions  . Inderal [Propranolol Hcl] Itching    SEVERE LIP(INSIDE ITCH) AND SKIN DRYNESS  . Toprol Xl [Metoprolol Tartrate] Other (See Comments)    Causes dizziness and fatigue  . Latex Itching    Sensitive to latex  . Tape Other (See Comments)    Very sensitive skin-     Review of Systems  HENT: Positive for congestion, sinus pressure and sinus pain. Negative for postnasal drip.        Objective:   Physical Exam  Constitutional: She appears well-developed and well-nourished.  HENT:  Right Ear: External ear normal.  Left Ear: External ear normal.  Mouth/Throat: Oropharynx is clear and moist.  Patient has some nonspecific dryness right naris along the anterior medial septum area. No evidence for any bleeding. Slightly prominent cartilage near  opening of the right naris but no evidence for any abscess. No erythema. No purulence. No ulcerations.       Assessment:     Sinus congestion. Doubt acute bacterial sinusitis    Plan:     -No indication for antibiotics at this time. Continue saline nasal irrigation. Caution with Flonase which can cause drying secondary to alcohol.  [name] MD [facility] at [facility]

## 2017-10-22 NOTE — Patient Instructions (Signed)
I would like for you you to schedule a Medicare Annual Wellness Visit (AWV).   This is a yearly appointment with our Health Coach (Susan Hauck, RN) and is designed to develop a personalized prevention plan. This is not a head to toe physical, but rather an opportunity to prevent illness based on your current health and risk factors for disease.   Visits usually last 30-60 minutes and include various screenings for hearing, vision, depression, and dementia, falls, and safety concerns. The visit also includes diet and exercise counseling and information about advance directives.   This is also an opportunity to discuss appropriate health maintenance testing such as mammography, colonoscopy, lung cancer screening, and hepatitis C testing.   The AWV is fully covered by Medicare Part B if:  . You have had Part B for over 12 months, AND . You have not had an AWV in the past 12 months .  Please don't miss out on this opportunity! Set up your appointment today!  

## 2017-10-28 ENCOUNTER — Ambulatory Visit (INDEPENDENT_AMBULATORY_CARE_PROVIDER_SITE_OTHER): Payer: Medicare Other | Admitting: *Deleted

## 2017-10-28 DIAGNOSIS — Z7901 Long term (current) use of anticoagulants: Secondary | ICD-10-CM | POA: Diagnosis not present

## 2017-10-28 LAB — POCT INR: INR: 2.3

## 2017-10-28 NOTE — Patient Instructions (Signed)
Description   Continue taking  1/2 tablet daily except 1 tablet on Sundays and Thursdays. Recheck in two weeks. Call us with any medication changes or concerns # [phone].

## 2017-10-29 ENCOUNTER — Ambulatory Visit: Payer: Medicare Other

## 2017-11-05 ENCOUNTER — Ambulatory Visit: Payer: Medicare Other

## 2017-11-11 ENCOUNTER — Ambulatory Visit (INDEPENDENT_AMBULATORY_CARE_PROVIDER_SITE_OTHER): Payer: Medicare Other | Admitting: *Deleted

## 2017-11-11 ENCOUNTER — Telehealth: Payer: Self-pay | Admitting: Internal Medicine

## 2017-11-11 ENCOUNTER — Telehealth: Payer: Self-pay

## 2017-11-11 DIAGNOSIS — I480 Paroxysmal atrial fibrillation: Secondary | ICD-10-CM | POA: Diagnosis not present

## 2017-11-11 DIAGNOSIS — Z5181 Encounter for therapeutic drug level monitoring: Secondary | ICD-10-CM

## 2017-11-11 DIAGNOSIS — Z8679 Personal history of other diseases of the circulatory system: Secondary | ICD-10-CM

## 2017-11-11 DIAGNOSIS — Z7901 Long term (current) use of anticoagulants: Secondary | ICD-10-CM

## 2017-11-11 DIAGNOSIS — Z9889 Other specified postprocedural states: Secondary | ICD-10-CM

## 2017-11-11 LAB — POCT INR: INR: 2.2 (ref 2.0–3.0)

## 2017-11-11 NOTE — Telephone Encounter (Signed)
New Message    Patient is returning call in reference to her upcoming appointment on 11/23/2017. She does say that she wants to keep her appointment on the 4th. She went to appointment in [facility] just to establish care in another state that she lives in 6 months out of the year. Please call. A detail message can be left.

## 2017-11-11 NOTE — Patient Instructions (Signed)
Description   Continue taking  1/2 tablet daily except 1 tablet on Sundays and Thursdays. Recheck in 4 weeks. Call us with any medication changes or concerns # [phone].

## 2017-11-11 NOTE — Telephone Encounter (Signed)
Spoke with pt's husband and confirmed pt will keep her appt for June 4th.

## 2017-11-11 NOTE — Telephone Encounter (Signed)
Called patient to confirm necessity for appt on 6/4. Patient has a loop recorder and has been followed remotely. Patient has been seen by Cardiology in [facility] in March 2019 and I was calling to get clarification on appt. Lmtcb.

## 2017-11-12 LAB — CUP PACEART REMOTE DEVICE CHECK
Date Time Interrogation Session: 20190428153808
MDC IDC PG IMPLANT DT: 20180625

## 2017-11-19 ENCOUNTER — Ambulatory Visit (INDEPENDENT_AMBULATORY_CARE_PROVIDER_SITE_OTHER): Payer: Medicare Other | Admitting: *Deleted

## 2017-11-19 DIAGNOSIS — I480 Paroxysmal atrial fibrillation: Secondary | ICD-10-CM | POA: Diagnosis not present

## 2017-11-22 NOTE — Progress Notes (Signed)
Carelink Summary Report / Loop Recorder 

## 2017-11-23 ENCOUNTER — Encounter: Payer: Self-pay | Admitting: Internal Medicine

## 2017-11-23 ENCOUNTER — Ambulatory Visit (INDEPENDENT_AMBULATORY_CARE_PROVIDER_SITE_OTHER): Payer: Medicare Other | Admitting: Internal Medicine

## 2017-11-23 ENCOUNTER — Encounter (INDEPENDENT_AMBULATORY_CARE_PROVIDER_SITE_OTHER): Payer: Self-pay

## 2017-11-23 VITALS — BP 110/80 | HR 72 | Ht 68.0 in | Wt 146.4 lb

## 2017-11-23 DIAGNOSIS — I480 Paroxysmal atrial fibrillation: Secondary | ICD-10-CM | POA: Diagnosis not present

## 2017-11-23 LAB — CUP PACEART INCLINIC DEVICE CHECK
Implantable Pulse Generator Implant Date: 20180625
MDC IDC SESS DTM: 20190604143716

## 2017-11-23 NOTE — Patient Instructions (Addendum)
Medication Instructions:  Your physician has recommended you make the following change in your medication:   1 Stop Coumadin  Labwork: You will have labs drawn today: CBC BMP   Testing/Procedures: None ordered.  Follow-Up: Your physician wants you to follow-up in: One Year with Chanetta Marshall, NP. You will receive a reminder letter in the mail two months in advance. If you don't receive a letter, please call our office to schedule the follow-up appointment.   Any Other Special Instructions Will Be Listed Below (If Applicable).     If you need a refill on your cardiac medications before your next appointment, please call your pharmacy.

## 2017-11-23 NOTE — Progress Notes (Signed)
Patient Care Team: [name], MD as PCP - General Druscilla Brownie, MD as Consulting Physician (Dermatology) [name], MD as Consulting Physician (General Surgery) Larey Dresser, MD as Consulting Physician (Cardiology) Huel Cote, NP as Nurse Practitioner (Obstetrics and Gynecology)   HPI  [name] is a 72 y.o. female Seen in follow-up for Linq implantation to clarify the presence or absence of atrial fibrillation following minimally invasive maze 2017 associated with clipping of left atrial appendage  DATE TEST EF   7/17 Echo   55-65 %         Date Hgb  5/18 13.*9       Has been feeling great.  Denies chest pain or shortness of breath.  She plays tennis.  She hates to watch baseball.  No bleeding but some bruising.  No interval palpitations.  Past Medical History:  Diagnosis Date  . Heart murmur   . History of melanoma excision   . History of palpitations   . IBS (irritable bowel syndrome)   . INSOMNIA, CHRONIC 03/29/2009  . Mitral regurgitation    severe by TEE  . MVP (mitral valve prolapse)   . Osteopenia 07/2016   T score -2.1 FRAX 11%/2.1%  . Paroxysmal atrial fibrillation ([place]) 10/18/2014  . Peripheral vascular disease ([place]) 10/16   RT LEG dvt  . Pneumonia 11/17   hx  . PONV (postoperative nausea and vomiting)   . Prepatellar bursitis    RT KNEE  . S/P minimally invasive maze operation for atrial fibrillation 11/19/2015   Complete bilateral atrial lesion set using cryothermy and bipolar radiofrequency ablation with clipping of LA appendage via right mini thoracotomy approch  . S/P minimally invasive mitral valve repair 11/19/2015   Complex valvuloplasty including artificial Gore-tex neochord placement x 22, closure of cleft in posterior leaflet, and 34 mm Edwards Physio II ring annuloplasty via right mini thoracotomy approach  . Skin cancer of arm    Squamous    Past Surgical History:  Procedure Laterality Date  . ABDOMINAL  HYSTERECTOMY  1980   WITH BSO/FIBROIDS  . APPENDECTOMY  1969  . CARDIAC CATHETERIZATION N/A 10/21/2015   Procedure: Right/Left Heart Cath and Coronary Angiography;  Surgeon: Larey Dresser, MD;  Location: [facility];  Service: Cardiovascular;  Laterality: N/A;  . CLIPPING OF ATRIAL APPENDAGE N/A 11/19/2015   Procedure: CLIPPING OF ATRIAL APPENDAGE;  Surgeon: Rexene Alberts, MD;  Location: [facility];  Service: Open Heart Surgery;  Laterality: N/A;  . COLONOSCOPY    . DIAGNOSTIC LAPAROSCOPY     expl  . EXCISION MELANOMA WITH SENTINEL LYMPH NODE BIOPSY Right 10/02/2013   Procedure: WIDE EXCISION MELANOMA RIGHT ARM WITH SENTINEL LYMPH NODE BIOPSY;  Surgeon: Shann Medal, MD;  Location: [facility];  Service: General;  Laterality: Right;  . KNEE BURSECTOMY Right 03/05/2014   Procedure: RIGHT KNEE BURSECTOMY;  Surgeon: Gearlean Alf, MD;  [facility];  Service: Orthopedics;  Laterality: Right;  . LOOP RECORDER INSERTION N/A 12/14/2016   Procedure: Loop Recorder Insertion;  Surgeon: Deboraha Sprang, MD;  Location: [facility];  Service: Cardiovascular;  Laterality: N/A;  . MINIMALLY INVASIVE MAZE PROCEDURE N/A 11/19/2015   Procedure: MINIMALLY INVASIVE MAZE PROCEDURE;  Surgeon: Rexene Alberts, MD;  Location: [facility];  Service: Open Heart Surgery;  Laterality: N/A;  . MITRAL VALVE REPAIR Right 11/19/2015   Procedure: MINIMALLY INVASIVE MITRAL VALVE REPAIR (MVR);  Surgeon: Braulio Conte  Keturah Barre, MD;  Location: [place] [place];  Service: Open Heart Surgery;  Laterality: Right;  . SHOULDER ARTHROSCOPY  2010   right  . TEE WITHOUT CARDIOVERSION N/A 10/21/2015   Procedure: TRANSESOPHAGEAL ECHOCARDIOGRAM (TEE);  Surgeon: Larey Dresser, MD;  Location: [facility];  Service: Cardiovascular;  Laterality: N/A;  . TEE WITHOUT CARDIOVERSION N/A 11/19/2015   Procedure: TRANSESOPHAGEAL ECHOCARDIOGRAM (TEE);  Surgeon: Rexene Alberts, MD;  Location: [place];  Service: Open Heart Surgery;  Laterality: N/A;   . THYROID CYST EXCISION  05-01-11   RIGHT  . TONSILLECTOMY  1980    Current Meds  Medication Sig  . atorvastatin (LIPITOR) 20 MG tablet TAKE 1 TABLET(20 MG) BY MOUTH DAILY  . desonide (DESOWEN) 0.05 % cream Apply 1 application topically daily as needed. For itching per patient  . ibuprofen (ADVIL,MOTRIN) 200 MG tablet Take 200 mg by mouth every 6 (six) hours as needed for moderate pain.  Marland Kitchen nystatin (MYCOSTATIN/NYSTOP) 100000 UNIT/GM POWD Apply 1 application topically daily as needed (skin).   Marland Kitchen warfarin (COUMADIN) 4 MG tablet Take 2-4 mg by mouth See admin instructions. 2 mg Sun Mon Wed Fri.  4 mg on Tues Thurs and Sat  . zolpidem (AMBIEN) 10 MG tablet TAKE 1 TABLET BY MOUTH EVERY NIGHT AT BEDTIME    Allergies  Allergen Reactions  . Inderal [Propranolol Hcl] Itching    SEVERE LIP(INSIDE ITCH) AND SKIN DRYNESS  . Toprol Xl [Metoprolol Tartrate] Other (See Comments)    Causes dizziness and fatigue  . Latex Itching    Sensitive to latex  . Tape Other (See Comments)    Very sensitive skin-      Review of Systems negative except from HPI and PMH  Physical Exam BP 110/80   Pulse 72   Ht 5\' 8"  (1.727 m)   Wt 146 lb 6.4 oz (66.4 kg)   LMP 06/22/1977   SpO2 99%   BMI 22.26 kg/m  Well developed and well nourished in no acute distress HENT normal E scleral and icterus clear Neck Supple JVP flat; carotids brisk and full Clear to ausculation Regular rate and rhythm, no murmurs gallops or rub Soft with active bowel sounds No clubbing cyanosis  Edema Alert and oriented, grossly normal motor and sensory function Skin Warm and Dry  NSR 72 18/09/42  Assessment and  Plan  Persistent atrial fibrillation s/p MAZE at time of mitral valve repair  There is been no interval atrial fibrillation since device implantation 6/18.  We will discontinue anticoagulation.  In the event that atrial fibrillation is rate detected, she will be advised to resume  anticoagulation.        Current medicines are reviewed at length with the patient today .  The patient does not  have concerns regarding medicines.

## 2017-11-24 LAB — BASIC METABOLIC PANEL
BUN / CREAT RATIO: 18 (ref 12–28)
BUN: 13 mg/dL (ref 8–27)
CHLORIDE: 103 mmol/L (ref 96–106)
CO2: 19 mmol/L — ABNORMAL LOW (ref 20–29)
Calcium: 10.1 mg/dL (ref 8.7–10.3)
Creatinine, Ser: 0.74 mg/dL (ref 0.57–1.00)
GFR calc non Af Amer: 83 mL/min/{1.73_m2} (ref >59)
GFR, EST AFRICAN AMERICAN: 96 mL/min/{1.73_m2} (ref >59)
Glucose: 83 mg/dL (ref 65–99)
Potassium: 4.2 mmol/L (ref 3.5–5.2)
SODIUM: 140 mmol/L (ref 134–144)

## 2017-11-24 LAB — CBC WITH DIFFERENTIAL/PLATELET
BASOS ABS: 0 10*3/uL (ref 0.0–0.2)
BASOS: 1 %
EOS (ABSOLUTE): 0.2 10*3/uL (ref 0.0–0.4)
Eos: 3 %
Hematocrit: 41.8 % (ref 34.0–46.6)
Hemoglobin: 14 g/dL (ref 11.1–15.9)
IMMATURE GRANS (ABS): 0 10*3/uL (ref 0.0–0.1)
Immature Granulocytes: 0 %
LYMPHS ABS: 1.5 10*3/uL (ref 0.7–3.1)
LYMPHS: 31 %
MCH: 34.2 pg — AB (ref 26.6–33.0)
MCHC: 33.5 g/dL (ref 31.5–35.7)
MCV: 102 fL — AB (ref 79–97)
MONOS ABS: 0.6 10*3/uL (ref 0.1–0.9)
Monocytes: 12 %
NEUTROS ABS: 2.6 10*3/uL (ref 1.4–7.0)
Neutrophils: 53 %
PLATELETS: 270 10*3/uL (ref 150–450)
RBC: 4.09 x10E6/uL (ref 3.77–5.28)
RDW: 13.6 % (ref 12.3–15.4)
WBC: 4.9 10*3/uL (ref 3.4–10.8)

## 2017-11-29 ENCOUNTER — Other Ambulatory Visit: Payer: Self-pay | Admitting: Family Medicine

## 2017-12-06 DIAGNOSIS — D229 Melanocytic nevi, unspecified: Secondary | ICD-10-CM | POA: Diagnosis not present

## 2017-12-06 DIAGNOSIS — Z85828 Personal history of other malignant neoplasm of skin: Secondary | ICD-10-CM | POA: Diagnosis not present

## 2017-12-06 DIAGNOSIS — L570 Actinic keratosis: Secondary | ICD-10-CM | POA: Diagnosis not present

## 2017-12-06 DIAGNOSIS — L814 Other melanin hyperpigmentation: Secondary | ICD-10-CM | POA: Diagnosis not present

## 2017-12-06 DIAGNOSIS — Z85820 Personal history of malignant melanoma of skin: Secondary | ICD-10-CM | POA: Diagnosis not present

## 2017-12-06 DIAGNOSIS — L821 Other seborrheic keratosis: Secondary | ICD-10-CM | POA: Diagnosis not present

## 2017-12-06 DIAGNOSIS — L309 Dermatitis, unspecified: Secondary | ICD-10-CM | POA: Diagnosis not present

## 2017-12-07 ENCOUNTER — Encounter: Payer: Self-pay | Admitting: Family Medicine

## 2017-12-07 ENCOUNTER — Ambulatory Visit (INDEPENDENT_AMBULATORY_CARE_PROVIDER_SITE_OTHER): Payer: Medicare Other | Admitting: Family Medicine

## 2017-12-07 VITALS — BP 132/78 | HR 87 | Temp 97.8°F | Wt 144.9 lb

## 2017-12-07 DIAGNOSIS — J0101 Acute recurrent maxillary sinusitis: Secondary | ICD-10-CM | POA: Diagnosis not present

## 2017-12-07 MED ORDER — FLUCONAZOLE 150 MG PO TABS: 150.0000 mg | ORAL_TABLET | Freq: Once | ORAL | 0 refills | Status: AC

## 2017-12-07 MED ORDER — AMOXICILLIN-POT CLAVULANATE 875-125 MG PO TABS: 1.0000 | ORAL_TABLET | Freq: Two times a day (BID) | ORAL | 0 refills | Status: DC

## 2017-12-07 NOTE — Progress Notes (Signed)
Subjective:     Patient ID: [name], female   DOB: 03-Mar-1946, 72 y.o.   MRN: [id number]  HPI Patient seen with ten-day history of sinus congestion and facial pain maxillary sinus region headaches. She's had some colored nasal discharge. Occasional cough. Increased malaise. Upper teeth pain.  No relief with over-the-counter medications.  She's had frequent sinusitis the past but not for several months.  Past Medical History:  Diagnosis Date  . Heart murmur   . History of melanoma excision   . History of palpitations   . IBS (irritable bowel syndrome)   . INSOMNIA, CHRONIC 03/29/2009  . Mitral regurgitation    severe by TEE  . MVP (mitral valve prolapse)   . Osteopenia 07/2016   T score -2.1 FRAX 11%/2.1%  . Paroxysmal atrial fibrillation ([place]) 10/18/2014  . Peripheral vascular disease ([place]) 10/16   RT LEG dvt  . Pneumonia 11/17   hx  . PONV (postoperative nausea and vomiting)   . Prepatellar bursitis    RT KNEE  . S/P minimally invasive maze operation for atrial fibrillation 11/19/2015   Complete bilateral atrial lesion set using cryothermy and bipolar radiofrequency ablation with clipping of LA appendage via right mini thoracotomy approch  . S/P minimally invasive mitral valve repair 11/19/2015   Complex valvuloplasty including artificial Gore-tex neochord placement x 22, closure of cleft in posterior leaflet, and 34 mm Edwards Physio II ring annuloplasty via right mini thoracotomy approach  . Skin cancer of arm    Squamous   Past Surgical History:  Procedure Laterality Date  . ABDOMINAL HYSTERECTOMY  1980   WITH BSO/FIBROIDS  . APPENDECTOMY  1969  . CARDIAC CATHETERIZATION N/A 10/21/2015   Procedure: Right/Left Heart Cath and Coronary Angiography;  Surgeon: Larey Dresser, MD;  Location: [facility];  Service: Cardiovascular;  Laterality: N/A;  . CLIPPING OF ATRIAL APPENDAGE N/A 11/19/2015   Procedure: CLIPPING OF ATRIAL APPENDAGE;  Surgeon: Rexene Alberts, MD;   Location: [place];  Service: Open Heart Surgery;  Laterality: N/A;  . COLONOSCOPY    . DIAGNOSTIC LAPAROSCOPY     expl  . EXCISION MELANOMA WITH SENTINEL LYMPH NODE BIOPSY Right 10/02/2013   Procedure: WIDE EXCISION MELANOMA RIGHT ARM WITH SENTINEL LYMPH NODE BIOPSY;  Surgeon: Shann Medal, MD;  Location: [facility];  Service: General;  Laterality: Right;  . KNEE BURSECTOMY Right 03/05/2014   Procedure: RIGHT KNEE BURSECTOMY;  Surgeon: Gearlean Alf, MD;  [facility];  Service: Orthopedics;  Laterality: Right;  . LOOP RECORDER INSERTION N/A 12/14/2016   Procedure: Loop Recorder Insertion;  Surgeon: Deboraha Sprang, MD;  Location: [facility];  Service: Cardiovascular;  Laterality: N/A;  . MINIMALLY INVASIVE MAZE PROCEDURE N/A 11/19/2015   Procedure: MINIMALLY INVASIVE MAZE PROCEDURE;  Surgeon: Rexene Alberts, MD;  Location: [place];  Service: Open Heart Surgery;  Laterality: N/A;  . MITRAL VALVE REPAIR Right 11/19/2015   Procedure: MINIMALLY INVASIVE MITRAL VALVE REPAIR (MVR);  Surgeon: Rexene Alberts, MD;  Location: [place];  Service: Open Heart Surgery;  Laterality: Right;  . SHOULDER ARTHROSCOPY  2010   right  . TEE WITHOUT CARDIOVERSION N/A 10/21/2015   Procedure: TRANSESOPHAGEAL ECHOCARDIOGRAM (TEE);  Surgeon: Larey Dresser, MD;  Location: [place];  Service: Cardiovascular;  Laterality: N/A;  . TEE WITHOUT CARDIOVERSION N/A 11/19/2015   Procedure: TRANSESOPHAGEAL ECHOCARDIOGRAM (TEE);  Surgeon: Rexene Alberts, MD;  Location: [place];  Service: Open Heart Surgery;  Laterality: N/A;  . THYROID CYST EXCISION  05-01-11   RIGHT  . TONSILLECTOMY  1980    reports that she quit smoking about 47 years ago. Her smoking use included cigarettes. She has a 20.00 pack-year smoking history. She has never used smokeless tobacco. She reports that she drinks about 8.4 oz of alcohol per week. She reports that she does not use drugs. family history includes Alcohol abuse in her  father; Breast cancer in her maternal grandmother; Diabetes type II in her mother; Heart disease in her mother; Ovarian cancer in her maternal aunt; Throat cancer in her father. Allergies  Allergen Reactions  . Inderal [Propranolol Hcl] Itching    SEVERE LIP(INSIDE ITCH) AND SKIN DRYNESS  . Toprol Xl [Metoprolol Tartrate] Other (See Comments)    Causes dizziness and fatigue  . Latex Itching    Sensitive to latex  . Tape Other (See Comments)    Very sensitive skin-     Review of Systems  Constitutional: Positive for fatigue. Negative for fever.  HENT: Positive for congestion, sinus pressure and sinus pain.   Respiratory: Negative for shortness of breath.   Cardiovascular: Negative for chest pain.  Neurological: Positive for headaches.       Objective:   Physical Exam  Constitutional: She appears well-developed and well-nourished.  HENT:  Mouth/Throat: Oropharynx is clear and moist.  Neck: Neck supple.  Cardiovascular: Normal rate and regular rhythm.  Pulmonary/Chest: Effort normal and breath sounds normal.  Lymphadenopathy:    She has no cervical adenopathy.       Assessment:     Probable acute maxillary sinusitis    Plan:     -Start Augmentin 875 mg twice daily for 10 days -Increase hydration -Touch base if not improved in 8-10 days  [name] MD [facility] at [facility]

## 2017-12-10 ENCOUNTER — Ambulatory Visit (INDEPENDENT_AMBULATORY_CARE_PROVIDER_SITE_OTHER): Payer: Medicare Other

## 2017-12-10 VITALS — BP 110/66 | HR 75 | Ht 66.5 in | Wt 143.0 lb

## 2017-12-10 DIAGNOSIS — Z0000 Encounter for general adult medical examination without abnormal findings: Secondary | ICD-10-CM | POA: Diagnosis not present

## 2017-12-10 DIAGNOSIS — Z1159 Encounter for screening for other viral diseases: Secondary | ICD-10-CM | POA: Diagnosis not present

## 2017-12-10 NOTE — Patient Instructions (Addendum)
[name] , Thank you for taking time to come for your Medicare Wellness Visit. I appreciate your ongoing commitment to your health goals. Please review the following plan we discussed and let me know if I can assist you in the future.    Medicare now request all "baby boomers" test for possible exposure to Hepatitis C. Many may have been exposed due to dental work, tatoo's, vaccinations when young. The Hepatitis C virus is dormant for many years and then sometimes will cause liver cancer. If you gave blood in the past 15 years, you were most likely checked for Hep C. If you rec'd blood; you may want to consider testing or if you are high risk for any other reason.     These are the goals we discussed: Goals    . Patient Stated     Continue to get ready to move !        This is a list of the screening recommended for you and due dates:  Health Maintenance  Topic Date Due  .  Hepatitis C: One time screening is recommended by Center for Disease Control  (CDC) for  adults born from [birth year] through 1963.   01-Dec-1945  . Flu Shot  01/20/2018  . Tetanus Vaccine  02/20/2018  . Mammogram  07/31/2018  . Colon Cancer Screening  12/11/2026  . DEXA scan (bone density measurement)  Completed  . Pneumonia vaccines  Completed      Fall Prevention in the Home Falls can cause injuries. They can happen to people of all ages. There are many things you can do to make your home safe and to help prevent falls. What can I do on the outside of my home?  Regularly fix the edges of walkways and driveways and fix any cracks.  Remove anything that might make you trip as you walk through a door, such as a raised step or threshold.  Trim any bushes or trees on the path to your home.  Use bright outdoor lighting.  Clear any walking paths of anything that might make someone trip, such as rocks or tools.  Regularly check to see if handrails are loose or broken. Make sure that both sides of any steps have  handrails.  Any raised decks and porches should have guardrails on the edges.  Have any leaves, snow, or ice cleared regularly.  Use sand or salt on walking paths during winter.  Clean up any spills in your garage right away. This includes oil or grease spills. What can I do in the bathroom?  Use night lights.  Install grab bars by the toilet and in the tub and shower. Do not use towel bars as grab bars.  Use non-skid mats or decals in the tub or shower.  If you need to sit down in the shower, use a plastic, non-slip stool.  Keep the floor dry. Clean up any water that spills on the floor as soon as it happens.  Remove soap buildup in the tub or shower regularly.  Attach bath mats securely with double-sided non-slip rug tape.  Do not have throw rugs and other things on the floor that can make you trip. What can I do in the bedroom?  Use night lights.  Make sure that you have a light by your bed that is easy to reach.  Do not use any sheets or blankets that are too big for your bed. They should not hang down onto the floor.  Have a  firm chair that has side arms. You can use this for support while you get dressed.  Do not have throw rugs and other things on the floor that can make you trip. What can I do in the kitchen?  Clean up any spills right away.  Avoid walking on wet floors.  Keep items that you use a lot in easy-to-reach places.  If you need to reach something above you, use a strong step stool that has a grab bar.  Keep electrical cords out of the way.  Do not use floor polish or wax that makes floors slippery. If you must use wax, use non-skid floor wax.  Do not have throw rugs and other things on the floor that can make you trip. What can I do with my stairs?  Do not leave any items on the stairs.  Make sure that there are handrails on both sides of the stairs and use them. Fix handrails that are broken or loose. Make sure that handrails are as long as  the stairways.  Check any carpeting to make sure that it is firmly attached to the stairs. Fix any carpet that is loose or worn.  Avoid having throw rugs at the top or bottom of the stairs. If you do have throw rugs, attach them to the floor with carpet tape.  Make sure that you have a light switch at the top of the stairs and the bottom of the stairs. If you do not have them, ask someone to add them for you. What else can I do to help prevent falls?  Wear shoes that: ? Do not have high heels. ? Have rubber bottoms. ? Are comfortable and fit you well. ? Are closed at the toe. Do not wear sandals.  If you use a stepladder: ? Make sure that it is fully opened. Do not climb a closed stepladder. ? Make sure that both sides of the stepladder are locked into place. ? Ask someone to hold it for you, if possible.  Clearly mark and make sure that you can see: ? Any grab bars or handrails. ? First and last steps. ? Where the edge of each step is.  Use tools that help you move around (mobility aids) if they are needed. These include: ? Canes. ? Walkers. ? Scooters. ? Crutches.  Turn on the lights when you go into a dark area. Replace any light bulbs as soon as they burn out.  Set up your furniture so you have a clear path. Avoid moving your furniture around.  If any of your floors are uneven, fix them.  If there are any pets around you, be aware of where they are.  Review your medicines with your doctor. Some medicines can make you feel dizzy. This can increase your chance of falling. Ask your doctor what other things that you can do to help prevent falls. This information is not intended to replace advice given to you by your health care provider. Make sure you discuss any questions you have with your health care provider. Document Released: 04/04/2009 Document Revised: 11/14/2015 Document Reviewed: 07/13/2014 Elsevier Interactive Patient Education  2018 [place]  Maintenance, Female Adopting a healthy lifestyle and getting preventive care can go a long way to promote health and wellness. Talk with your health care provider about what schedule of regular examinations is right for you. This is a good chance for you to check in with your provider about disease prevention and staying healthy.  In between checkups, there are plenty of things you can do on your own. Experts have done a lot of research about which lifestyle changes and preventive measures are most likely to keep you healthy. Ask your health care provider for more information. Weight and diet Eat a healthy diet  Be sure to include plenty of vegetables, fruits, low-fat dairy products, and lean protein.  Do not eat a lot of foods high in solid fats, added sugars, or salt.  Get regular exercise. This is one of the most important things you can do for your health. ? Most adults should exercise for at least 150 minutes each week. The exercise should increase your heart rate and make you sweat (moderate-intensity exercise). ? Most adults should also do strengthening exercises at least twice a week. This is in addition to the moderate-intensity exercise.  Maintain a healthy weight  Body mass index (BMI) is a measurement that can be used to identify possible weight problems. It estimates body fat based on height and weight. Your health care provider can help determine your BMI and help you achieve or maintain a healthy weight.  For females 3 years of age and older: ? A BMI below 18.5 is considered underweight. ? A BMI of 18.5 to 24.9 is normal. ? A BMI of 25 to 29.9 is considered overweight. ? A BMI of 30 and above is considered obese.  Watch levels of cholesterol and blood lipids  You should start having your blood tested for lipids and cholesterol at 70 years of age, then have this test every 5 years.  You may need to have your cholesterol levels checked more often if: ? Your lipid or  cholesterol levels are high. ? You are older than 70 years of age. ? You are at high risk for heart disease.  Cancer screening Lung Cancer  Lung cancer screening is recommended for adults 43-60 years old who are at high risk for lung cancer because of a history of smoking.  A yearly low-dose CT scan of the lungs is recommended for people who: ? Currently smoke. ? Have quit within the past 15 years. ? Have at least a 30-pack-year history of smoking. A pack year is smoking an average of one pack of cigarettes a day for 1 year.  Yearly screening should continue until it has been 15 years since you quit.  Yearly screening should stop if you develop a health problem that would prevent you from having lung cancer treatment.  Breast Cancer  Practice breast self-awareness. This means understanding how your breasts normally appear and feel.  It also means doing regular breast self-exams. Let your health care provider know about any changes, no matter how small.  If you are in your 20s or 30s, you should have a clinical breast exam (CBE) by a health care provider every 1-3 years as part of a regular health exam.  If you are 35 or older, have a CBE every year. Also consider having a breast X-ray (mammogram) every year.  If you have a family history of breast cancer, talk to your health care provider about genetic screening.  If you are at high risk for breast cancer, talk to your health care provider about having an MRI and a mammogram every year.  Breast cancer gene (BRCA) assessment is recommended for women who have family members with BRCA-related cancers. BRCA-related cancers include: ? Breast. ? Ovarian. ? Tubal. ? Peritoneal cancers.  Results of the assessment will determine the need  for genetic counseling and BRCA1 and BRCA2 testing.  Cervical Cancer Your health care provider may recommend that you be screened regularly for cancer of the pelvic organs (ovaries, uterus, and  vagina). This screening involves a pelvic examination, including checking for microscopic changes to the surface of your cervix (Pap test). You may be encouraged to have this screening done every 3 years, beginning at age 65.  For women ages 9-65, health care providers may recommend pelvic exams and Pap testing every 3 years, or they may recommend the Pap and pelvic exam, combined with testing for human papilloma virus (HPV), every 5 years. Some types of HPV increase your risk of cervical cancer. Testing for HPV may also be done on women of any age with unclear Pap test results.  Other health care providers may not recommend any screening for nonpregnant women who are considered low risk for pelvic cancer and who do not have symptoms. Ask your health care provider if a screening pelvic exam is right for you.  If you have had past treatment for cervical cancer or a condition that could lead to cancer, you need Pap tests and screening for cancer for at least 20 years after your treatment. If Pap tests have been discontinued, your risk factors (such as having a new sexual partner) need to be reassessed to determine if screening should resume. Some women have medical problems that increase the chance of getting cervical cancer. In these cases, your health care provider may recommend more frequent screening and Pap tests.  Colorectal Cancer  This type of cancer can be detected and often prevented.  Routine colorectal cancer screening usually begins at 70 years of age and continues through 70 years of age.  Your health care provider may recommend screening at an earlier age if you have risk factors for colon cancer.  Your health care provider may also recommend using home test kits to check for hidden blood in the stool.  A small camera at the end of a tube can be used to examine your colon directly (sigmoidoscopy or colonoscopy). This is done to check for the earliest forms of colorectal  cancer.  Routine screening usually begins at age 19.  Direct examination of the colon should be repeated every 5-10 years through 70 years of age. However, you may need to be screened more often if early forms of precancerous polyps or small growths are found.  Skin Cancer  Check your skin from head to toe regularly.  Tell your health care provider about any new moles or changes in moles, especially if there is a change in a mole's shape or color.  Also tell your health care provider if you have a mole that is larger than the size of a pencil eraser.  Always use sunscreen. Apply sunscreen liberally and repeatedly throughout the day.  Protect yourself by wearing long sleeves, pants, a wide-brimmed hat, and sunglasses whenever you are outside.  Heart disease, diabetes, and high blood pressure  High blood pressure causes heart disease and increases the risk of stroke. High blood pressure is more likely to develop in: ? People who have blood pressure in the high end of the normal range (130-139/85-89 mm Hg). ? People who are overweight or obese. ? People who are African American.  If you are 36-44 years of age, have your blood pressure checked every 3-5 years. If you are 60 years of age or older, have your blood pressure checked every year. You should have your blood  pressure measured twice-once when you are at a hospital or clinic, and once when you are not at a hospital or clinic. Record the average of the two measurements. To check your blood pressure when you are not at a hospital or clinic, you can use: ? An automated blood pressure machine at a pharmacy. ? A home blood pressure monitor.  If you are between 9 years and 12 years old, ask your health care provider if you should take aspirin to prevent strokes.  Have regular diabetes screenings. This involves taking a blood sample to check your fasting blood sugar level. ? If you are at a normal weight and have a low risk for diabetes,  have this test once every three years after 70 years of age. ? If you are overweight and have a high risk for diabetes, consider being tested at a younger age or more often. Preventing infection Hepatitis B  If you have a higher risk for hepatitis B, you should be screened for this virus. You are considered at high risk for hepatitis B if: ? You were born in a country where hepatitis B is common. Ask your health care provider which countries are considered high risk. ? Your parents were born in a high-risk country, and you have not been immunized against hepatitis B (hepatitis B vaccine). ? You have HIV or AIDS. ? You use needles to inject street drugs. ? You live with someone who has hepatitis B. ? You have had sex with someone who has hepatitis B. ? You get hemodialysis treatment. ? You take certain medicines for conditions, including cancer, organ transplantation, and autoimmune conditions.  Hepatitis C  Blood testing is recommended for: ? Everyone born from [birth year] through 1963. ? Anyone with known risk factors for hepatitis C.  Sexually transmitted infections (STIs)  You should be screened for sexually transmitted infections (STIs) including gonorrhea and chlamydia if: ? You are sexually active and are younger than 70 years of age. ? You are older than 70 years of age and your health care provider tells you that you are at risk for this type of infection. ? Your sexual activity has changed since you were last screened and you are at an increased risk for chlamydia or gonorrhea. Ask your health care provider if you are at risk.  If you do not have HIV, but are at risk, it may be recommended that you take a prescription medicine daily to prevent HIV infection. This is called pre-exposure prophylaxis (PrEP). You are considered at risk if: ? You are sexually active and do not regularly use condoms or know the HIV status of your partner(s). ? You take drugs by injection. ? You are  sexually active with a partner who has HIV.  Talk with your health care provider about whether you are at high risk of being infected with HIV. If you choose to begin PrEP, you should first be tested for HIV. You should then be tested every 3 months for as long as you are taking PrEP. Pregnancy  If you are premenopausal and you may become pregnant, ask your health care provider about preconception counseling.  If you may become pregnant, take 400 to 800 micrograms (mcg) of folic acid every day.  If you want to prevent pregnancy, talk to your health care provider about birth control (contraception). Osteoporosis and menopause  Osteoporosis is a disease in which the bones lose minerals and strength with aging. This can result in serious bone fractures. Your risk  for osteoporosis can be identified using a bone density scan.  If you are 76 years of age or older, or if you are at risk for osteoporosis and fractures, ask your health care provider if you should be screened.  Ask your health care provider whether you should take a calcium or vitamin D supplement to lower your risk for osteoporosis.  Menopause may have certain physical symptoms and risks.  Hormone replacement therapy may reduce some of these symptoms and risks. Talk to your health care provider about whether hormone replacement therapy is right for you. Follow these instructions at home:  Schedule regular health, dental, and eye exams.  Stay current with your immunizations.  Do not use any tobacco products including cigarettes, chewing tobacco, or electronic cigarettes.  If you are pregnant, do not drink alcohol.  If you are breastfeeding, limit how much and how often you drink alcohol.  Limit alcohol intake to no more than 1 drink per day for nonpregnant women. One drink equals 12 ounces of beer, 5 ounces of wine, or 1 ounces of hard liquor.  Do not use street drugs.  Do not share needles.  Ask your health care  provider for help if you need support or information about quitting drugs.  Tell your health care provider if you often feel depressed.  Tell your health care provider if you have ever been abused or do not feel safe at home. This information is not intended to replace advice given to you by your health care provider. Make sure you discuss any questions you have with your health care provider. Document Released: 12/22/2010 Document Revised: 11/14/2015 Document Reviewed: 03/12/2015 Elsevier Interactive Patient Education  [employer].

## 2017-12-10 NOTE — Progress Notes (Addendum)
Subjective:   [name] is a 72 y.o. female who presents for Medicare Annual (Subsequent) preventive examination.  Reports health as good Tries to eat properly  3 children Son lives here [place] children none  Diet BMI 22.7 Hardly ever go out Breakfast does not eat breakfast Lunch; salad  Supper cooks every night   Gets up goes to play tennis  Or goes to the gym Walk an hour; and sometimes she does both  Plays tennis 1 to 2 days a week  Likes the variety   Moving to a another place  Moving to [place]  Has a son moving to [place]    There are no preventive care reminders to display for this patient.  Mammogram 07/2016; [name] NP at [facility] [name] NP)  Dexa 08/06/2016 - reported osteopenia - NP GYN Colonoscopy 11/2016 repeat in 10 years;   Former smoker Generally 2 glasses of wine in the evening   Does not sleep well; 4 hours with Ambien;  10-2 and then awake Discussed trying a week without ETOH to see if this increased her ability to sleep. States this has been a long term issue.   Mother died at 12 yo after an illness of one day  Father died at 72    Cardiac Risk Factors include: advanced age (>61men, >28 women);dyslipidemia;family history of premature cardiovascular disease     Objective:     Vitals: BP 110/66   Pulse 75   Ht 5' 6.5" (1.689 m)   Wt 143 lb (64.9 kg)   LMP 06/22/1977   SpO2 98%   BMI 22.74 kg/m   Body mass index is 22.74 kg/m.  Advanced Directives 12/10/2017 12/14/2016 02/20/2016 11/20/2015 11/19/2015 11/15/2015 10/21/2015  Does Patient Have a Medical Advance Directive? Yes Yes Yes Yes - Yes Yes  Type of Advance Directive - Healthcare Power of [place];Living will Living will;Healthcare Power of Attorney - Living will;Healthcare Power of Attorney Living will;Healthcare Power of Attorney  Does patient want to make changes to medical advance directive? - - - - - No - Patient declined -  Copy of  [place] in Chart? - - No - copy requested - No - copy requested No - copy requested No - copy requested    Tobacco Social History   Tobacco Use  Smoking Status Former Smoker  . Packs/day: 1.00  . Years: 20.00  . Pack years: 20.00  . Types: Cigarettes  . Last attempt to quit: 08/11/1970  . Years since quitting: 47.3  Smokeless Tobacco Never Used     Counseling given: Yes   Clinical Intake:     Past Medical History:  Diagnosis Date  . Heart murmur   . History of melanoma excision   . History of palpitations   . IBS (irritable bowel syndrome)   . INSOMNIA, CHRONIC 03/29/2009  . Mitral regurgitation    severe by TEE  . MVP (mitral valve prolapse)   . Osteopenia 07/2016   T score -2.1 FRAX 11%/2.1%  . Paroxysmal atrial fibrillation ([place]) 10/18/2014  . Peripheral vascular disease ([place] [facility]) 10/16   RT LEG dvt  . Pneumonia 11/17   hx  . PONV (postoperative nausea and vomiting)   . Prepatellar bursitis    RT KNEE  . S/P minimally invasive maze operation for atrial fibrillation 11/19/2015   Complete bilateral atrial lesion set using cryothermy and bipolar radiofrequency ablation with clipping of LA appendage via right mini  thoracotomy approch  . S/P minimally invasive mitral valve repair 11/19/2015   Complex valvuloplasty including artificial Gore-tex neochord placement x 22, closure of cleft in posterior leaflet, and 34 mm Edwards Physio II ring annuloplasty via right mini thoracotomy approach  . Skin cancer of arm    Squamous   Past Surgical History:  Procedure Laterality Date  . ABDOMINAL HYSTERECTOMY  1980   WITH BSO/FIBROIDS  . APPENDECTOMY  1969  . CARDIAC CATHETERIZATION N/A 10/21/2015   Procedure: Right/Left Heart Cath and Coronary Angiography;  Surgeon: Larey Dresser, MD;  Location: [facility];  Service: Cardiovascular;  Laterality: N/A;  . CLIPPING OF ATRIAL APPENDAGE N/A 11/19/2015   Procedure: CLIPPING OF ATRIAL APPENDAGE;  Surgeon:  Rexene Alberts, MD;  Location: [facility];  Service: Open Heart Surgery;  Laterality: N/A;  . COLONOSCOPY    . DIAGNOSTIC LAPAROSCOPY     expl  . EXCISION MELANOMA WITH SENTINEL LYMPH NODE BIOPSY Right 10/02/2013   Procedure: WIDE EXCISION MELANOMA RIGHT ARM WITH SENTINEL LYMPH NODE BIOPSY;  Surgeon: Shann Medal, MD;  Location: [facility];  Service: General;  Laterality: Right;  . KNEE BURSECTOMY Right 03/05/2014   Procedure: RIGHT KNEE BURSECTOMY;  Surgeon: Gearlean Alf, MD;  Location: [facility];  Service: Orthopedics;  Laterality: Right;  . LOOP RECORDER INSERTION N/A 12/14/2016   Procedure: Loop Recorder Insertion;  Surgeon: Deboraha Sprang, MD;  Location: [facility];  Service: Cardiovascular;  Laterality: N/A;  . MINIMALLY INVASIVE MAZE PROCEDURE N/A 11/19/2015   Procedure: MINIMALLY INVASIVE MAZE PROCEDURE;  Surgeon: Rexene Alberts, MD;  Location: [facility];  Service: Open Heart Surgery;  Laterality: N/A;  . MITRAL VALVE REPAIR Right 11/19/2015   Procedure: MINIMALLY INVASIVE MITRAL VALVE REPAIR (MVR);  Surgeon: Rexene Alberts, MD;  Location: [facility];  Service: Open Heart Surgery;  Laterality: Right;  . SHOULDER ARTHROSCOPY  2010   right  . TEE WITHOUT CARDIOVERSION N/A 10/21/2015   Procedure: TRANSESOPHAGEAL ECHOCARDIOGRAM (TEE);  Surgeon: Larey Dresser, MD;  Location: [facility];  Service: Cardiovascular;  Laterality: N/A;  . TEE WITHOUT CARDIOVERSION N/A 11/19/2015   Procedure: TRANSESOPHAGEAL ECHOCARDIOGRAM (TEE);  Surgeon: Rexene Alberts, MD;  Location: [facility];  Service: Open Heart Surgery;  Laterality: N/A;  . THYROID CYST EXCISION  05-01-11   RIGHT  . TONSILLECTOMY  1980   Family History  Problem Relation Age of Onset  . Diabetes type II Mother   . Heart disease Mother        age 22  . Alcohol abuse Father   . Throat cancer Father   . Ovarian cancer Maternal Aunt   . Breast cancer Maternal Grandmother    Social History   Socioeconomic History  . Marital  status: Married    Spouse name: Not on file  . Number of children: Not on file  . Years of education: Not on file  . Highest education level: Not on file  Occupational History  . Occupation: retired    Fish farm manager: UNEMPLOYED  Social Needs  . Financial resource strain: Not on file  . Food insecurity:    Worry: Not on file    Inability: Not on file  . Transportation needs:    Medical: Not on file    Non-medical: Not on file  Tobacco Use  . Smoking status: Former Smoker    Packs/day: 1.00    Years: 20.00    Pack years: 20.00    Types: Cigarettes  Last attempt to quit: 08/11/1970    Years since quitting: 47.3  . Smokeless tobacco: Never Used  Substance and Sexual Activity  . Alcohol use: Yes    Alcohol/week: 8.4 oz    Types: 14 Glasses of wine per week    Comment: 2 per day   . Drug use: No  . Sexual activity: Yes    Birth control/protection: Surgical    Comment: intercourse age 85, sexual partners less than 5  Lifestyle  . Physical activity:    Days per week: Not on file    Minutes per session: Not on file  . Stress: Not on file  Relationships  . Social connections:    Talks on phone: Not on file    Gets together: Not on file    Attends religious service: Not on file    Active member of club or organization: Not on file    Attends meetings of clubs or organizations: Not on file    Relationship status: Not on file  Other Topics Concern  . Not on file  Social History Narrative  . Not on file    Outpatient Encounter Medications as of 12/10/2017  Medication Sig  . amoxicillin-clavulanate (AUGMENTIN) 875-125 MG tablet Take 1 tablet by mouth 2 (two) times daily.  Marland Kitchen atorvastatin (LIPITOR) 20 MG tablet TAKE 1 TABLET BY MOUTH DAILY  . desonide (DESOWEN) 0.05 % cream Apply 1 application topically daily as needed. For itching per patient  . ibuprofen (ADVIL,MOTRIN) 200 MG tablet Take 200 mg by mouth every 6 (six) hours as needed for moderate pain.  Marland Kitchen nystatin  (MYCOSTATIN/NYSTOP) 100000 UNIT/GM POWD Apply 1 application topically daily as needed (skin).   Marland Kitchen zolpidem (AMBIEN) 10 MG tablet TAKE 1 TABLET BY MOUTH EVERY NIGHT AT BEDTIME  . diltiazem (CARDIZEM CD) 120 MG 24 hr capsule Take 1 capsule (120 mg total) by mouth daily.   No facility-administered encounter medications on file as of 12/10/2017.     Activities of Daily Living In your present state of health, do you have any difficulty performing the following activities: 12/10/2017 12/14/2016  Hearing? N N  Vision? N N  Difficulty concentrating or making decisions? N N  Walking or climbing stairs? N N  Dressing or bathing? N N  Doing errands, shopping? N -  Preparing Food and eating ? N -  Using the Toilet? N -  In the past six months, have you accidently leaked urine? N -  Do you have problems with loss of bowel control? N -  Managing your Medications? N -  Managing your Finances? N -  Housekeeping or managing your Housekeeping? N -  Some recent data might be hidden    Patient Care Team: [name], MD as PCP - General Druscilla Brownie, MD as Consulting Physician (Dermatology) [name], MD as Consulting Physician (General Surgery) Larey Dresser, MD as Consulting Physician (Cardiology) Huel Cote, NP as Nurse Practitioner (Obstetrics and Gynecology)    Assessment:   This is a routine wellness examination for [name].  Exercise Activities and Dietary recommendations Current Exercise Habits: Home exercise routine;Structured exercise class, Type of exercise: strength training/weights;Other - see comments;walking(plays tennis ), Time (Minutes): 60, Frequency (Times/Week): 5, Weekly Exercise (Minutes/Week): 300, Intensity: Moderate  Goals    . Patient Stated     Continue to get ready to move !        Fall Risk Fall Risk  12/10/2017 01/15/2016 01/04/2015 10/19/2013  Falls in the past year? Yes  No No No  Number falls in past yr: 1 - - -  Injury with Fall? Yes - - -    Follow up Education provided - - -     Depression Screen PHQ 2/9 Scores 12/10/2017 04/22/2016 01/22/2016 01/04/2015  PHQ - 2 Score 0 0 0 0     Cognitive Function MMSE - Mini Mental State Exam 12/10/2017  Not completed: (No Data)   Ad8 score reviewed for issues:  Issues making decisions:  Less interest in hobbies / activities:  Repeats questions, stories (family complaining):  Trouble using ordinary gadgets (microwave, computer, phone):  Forgets the month or year:   Mismanaging finances:   Remembering appts:  Daily problems with thinking and/or memory: Ad8 score is=0   Immunization History  Administered Date(s) Administered  . Influenza Split 04/14/2011, 03/25/2012  . Influenza, High Dose Seasonal PF 07/07/2016  . Influenza,inj,Quad PF,6+ Mos 06/20/2013  . Influenza-Unspecified 03/22/2014, 02/24/2017  . Pneumococcal Conjugate-13 10/19/2013  . Pneumococcal Polysaccharide-23 01/04/2015  . Td 02/21/2008  . Zoster 01/16/2010  . Zoster Recombinat (Shingrix) 02/24/2017, 04/22/2017   Screening Tests Health Maintenance  Topic Date Due  . Hepatitis C Screening  12/11/2018 (Originally 1946/04/16)  . INFLUENZA VACCINE  01/20/2018  . TETANUS/TDAP  02/20/2018  . MAMMOGRAM  07/31/2018  . COLONOSCOPY  12/11/2026  . DEXA SCAN  Completed  . PNA vac Low Risk Adult  Completed         Plan:      PCP Notes   Health Maintenance [name] NP manages mammogram, dexa and women's health. Has completed her shingrix vaccines;   Abnormal Screens  (2 glasses of wine most nights)   Ordered Hep c for her next blood draw if she is still in town   Referrals  none  Patient concerns; Plans to move to [facility] )   Nurse Concerns; As noted   Next PCP apt Will schedule      I have personally reviewed and noted the following in the patient's chart:   . Medical and social history . Use of alcohol, tobacco or illicit drugs  . Current medications and  supplements . Functional ability and status . Nutritional status . Physical activity . Advanced directives . List of other physicians . Hospitalizations, surgeries, and ER visits in previous 12 months . Vitals . Screenings to include cognitive, depression, and falls . Referrals and appointments  In addition, I have reviewed and discussed with patient certain preventive protocols, quality metrics, and best practice recommendations. A written personalized care plan for preventive services as well as general preventive health recommendations were provided to patient.     WUGQB,VQXIH, RN  12/10/2017  I have reviewed the documentation for the [facility] and [name] provided by the health coach and agree with their documentation. I was immediately available for any questions  [name] MD [facility] at [name]

## 2017-12-13 LAB — CUP PACEART REMOTE DEVICE CHECK
Implantable Pulse Generator Implant Date: 20180625
MDC IDC SESS DTM: 20190531160855

## 2017-12-17 ENCOUNTER — Other Ambulatory Visit: Payer: Self-pay | Admitting: Family Medicine

## 2017-12-17 NOTE — Telephone Encounter (Signed)
Last refill 05/04/17 and last office visit 12/07/17.  Okay to fill?

## 2017-12-18 NOTE — Telephone Encounter (Signed)
Refill with 2 additional refills. 

## 2017-12-22 ENCOUNTER — Ambulatory Visit (INDEPENDENT_AMBULATORY_CARE_PROVIDER_SITE_OTHER): Payer: Medicare Other | Admitting: *Deleted

## 2017-12-22 DIAGNOSIS — I480 Paroxysmal atrial fibrillation: Secondary | ICD-10-CM

## 2017-12-22 NOTE — Progress Notes (Signed)
Carelink Summary Report / Loop Recorder 

## 2018-01-06 ENCOUNTER — Other Ambulatory Visit: Payer: Self-pay | Admitting: Internal Medicine

## 2018-01-11 DIAGNOSIS — H16143 Punctate keratitis, bilateral: Secondary | ICD-10-CM | POA: Diagnosis not present

## 2018-01-24 ENCOUNTER — Ambulatory Visit (INDEPENDENT_AMBULATORY_CARE_PROVIDER_SITE_OTHER): Payer: Medicare Other | Admitting: *Deleted

## 2018-01-24 DIAGNOSIS — I480 Paroxysmal atrial fibrillation: Secondary | ICD-10-CM

## 2018-01-25 NOTE — Progress Notes (Signed)
Carelink Summary Report / Loop Recorder 

## 2018-01-28 LAB — CUP PACEART REMOTE DEVICE CHECK
Date Time Interrogation Session: 20190703160848
MDC IDC PG IMPLANT DT: 20180625

## 2018-02-01 ENCOUNTER — Encounter: Payer: Self-pay | Admitting: Women's Health

## 2018-02-01 ENCOUNTER — Ambulatory Visit (INDEPENDENT_AMBULATORY_CARE_PROVIDER_SITE_OTHER): Payer: Medicare Other | Admitting: Women's Health

## 2018-02-01 VITALS — BP 118/78 | Ht 66.0 in | Wt 146.0 lb

## 2018-02-01 DIAGNOSIS — M8580 Other specified disorders of bone density and structure, unspecified site: Secondary | ICD-10-CM | POA: Diagnosis not present

## 2018-02-01 DIAGNOSIS — M8588 Other specified disorders of bone density and structure, other site: Secondary | ICD-10-CM

## 2018-02-01 DIAGNOSIS — Z01419 Encounter for gynecological examination (general) (routine) without abnormal findings: Secondary | ICD-10-CM | POA: Diagnosis not present

## 2018-02-01 DIAGNOSIS — E559 Vitamin D deficiency, unspecified: Secondary | ICD-10-CM

## 2018-02-01 NOTE — Patient Instructions (Signed)
Health Maintenance for Postmenopausal Women Menopause is a normal process in which your reproductive ability comes to an end. This process happens gradually over a span of months to years, usually between the ages of 22 and 9. Menopause is complete when you have missed 12 consecutive menstrual periods. It is important to talk with your health care provider about some of the most common conditions that affect postmenopausal women, such as heart disease, cancer, and bone loss (osteoporosis). Adopting a healthy lifestyle and getting preventive care can help to promote your health and wellness. Those actions can also lower your chances of developing some of these common conditions. What should I know about menopause? During menopause, you may experience a number of symptoms, such as:  Moderate-to-severe hot flashes.  Night sweats.  Decrease in sex drive.  Mood swings.  Headaches.  Tiredness.  Irritability.  Memory problems.  Insomnia.  Choosing to treat or not to treat menopausal changes is an individual decision that you make with your health care provider. What should I know about hormone replacement therapy and supplements? Hormone therapy products are effective for treating symptoms that are associated with menopause, such as hot flashes and night sweats. Hormone replacement carries certain risks, especially as you become older. If you are thinking about using estrogen or estrogen with progestin treatments, discuss the benefits and risks with your health care provider. What should I know about heart disease and stroke? Heart disease, heart attack, and stroke become more likely as you age. This may be due, in part, to the hormonal changes that your body experiences during menopause. These can affect how your body processes dietary fats, triglycerides, and cholesterol. Heart attack and stroke are both medical emergencies. There are many things that you can do to help prevent heart disease  and stroke:  Have your blood pressure checked at least every 1-2 years. High blood pressure causes heart disease and increases the risk of stroke.  If you are 53-22 years old, ask your health care provider if you should take aspirin to prevent a heart attack or a stroke.  Do not use any tobacco products, including cigarettes, chewing tobacco, or electronic cigarettes. If you need help quitting, ask your health care provider.  It is important to eat a healthy diet and maintain a healthy weight. ? Be sure to include plenty of vegetables, fruits, low-fat dairy products, and lean protein. ? Avoid eating foods that are high in solid fats, added sugars, or salt (sodium).  Get regular exercise. This is one of the most important things that you can do for your health. ? Try to exercise for at least 150 minutes each week. The type of exercise that you do should increase your heart rate and make you sweat. This is known as moderate-intensity exercise. ? Try to do strengthening exercises at least twice each week. Do these in addition to the moderate-intensity exercise.  Know your numbers.Ask your health care provider to check your cholesterol and your blood glucose. Continue to have your blood tested as directed by your health care provider.  What should I know about cancer screening? There are several types of cancer. Take the following steps to reduce your risk and to catch any cancer development as early as possible. Breast Cancer  Practice breast self-awareness. ? This means understanding how your breasts normally appear and feel. ? It also means doing regular breast self-exams. Let your health care provider know about any changes, no matter how small.  If you are 40  or older, have a clinician do a breast exam (clinical breast exam or CBE) every year. Depending on your age, family history, and medical history, it may be recommended that you also have a yearly breast X-ray (mammogram).  If you  have a family history of breast cancer, talk with your health care provider about genetic screening.  If you are at high risk for breast cancer, talk with your health care provider about having an MRI and a mammogram every year.  Breast cancer (BRCA) gene test is recommended for women who have family members with BRCA-related cancers. Results of the assessment will determine the need for genetic counseling and BRCA1 and for BRCA2 testing. BRCA-related cancers include these types: ? Breast. This occurs in males or females. ? Ovarian. ? Tubal. This may also be called fallopian tube cancer. ? Cancer of the abdominal or pelvic lining (peritoneal cancer). ? Prostate. ? Pancreatic.  Cervical, Uterine, and Ovarian Cancer Your health care provider may recommend that you be screened regularly for cancer of the pelvic organs. These include your ovaries, uterus, and vagina. This screening involves a pelvic exam, which includes checking for microscopic changes to the surface of your cervix (Pap test).  For women ages 21-65, health care providers may recommend a pelvic exam and a Pap test every three years. For women ages 79-65, they may recommend the Pap test and pelvic exam, combined with testing for human papilloma virus (HPV), every five years. Some types of HPV increase your risk of cervical cancer. Testing for HPV may also be done on women of any age who have unclear Pap test results.  Other health care providers may not recommend any screening for nonpregnant women who are considered low risk for pelvic cancer and have no symptoms. Ask your health care provider if a screening pelvic exam is right for you.  If you have had past treatment for cervical cancer or a condition that could lead to cancer, you need Pap tests and screening for cancer for at least 20 years after your treatment. If Pap tests have been discontinued for you, your risk factors (such as having a new sexual partner) need to be  reassessed to determine if you should start having screenings again. Some women have medical problems that increase the chance of getting cervical cancer. In these cases, your health care provider may recommend that you have screening and Pap tests more often.  If you have a family history of uterine cancer or ovarian cancer, talk with your health care provider about genetic screening.  If you have vaginal bleeding after reaching menopause, tell your health care provider.  There are currently no reliable tests available to screen for ovarian cancer.  Lung Cancer Lung cancer screening is recommended for adults 69-62 years old who are at high risk for lung cancer because of a history of smoking. A yearly low-dose CT scan of the lungs is recommended if you:  Currently smoke.  Have a history of at least 30 pack-years of smoking and you currently smoke or have quit within the past 15 years. A pack-year is smoking an average of one pack of cigarettes per day for one year.  Yearly screening should:  Continue until it has been 15 years since you quit.  Stop if you develop a health problem that would prevent you from having lung cancer treatment.  Colorectal Cancer  This type of cancer can be detected and can often be prevented.  Routine colorectal cancer screening usually begins at  age 42 and continues through age 45.  If you have risk factors for colon cancer, your health care provider may recommend that you be screened at an earlier age.  If you have a family history of colorectal cancer, talk with your health care provider about genetic screening.  Your health care provider may also recommend using home test kits to check for hidden blood in your stool.  A small camera at the end of a tube can be used to examine your colon directly (sigmoidoscopy or colonoscopy). This is done to check for the earliest forms of colorectal cancer.  Direct examination of the colon should be repeated every  5-10 years until age 71. However, if early forms of precancerous polyps or small growths are found or if you have a family history or genetic risk for colorectal cancer, you may need to be screened more often.  Skin Cancer  Check your skin from head to toe regularly.  Monitor any moles. Be sure to tell your health care provider: ? About any new moles or changes in moles, especially if there is a change in a mole's shape or color. ? If you have a mole that is larger than the size of a pencil eraser.  If any of your family members has a history of skin cancer, especially at a Markiyah Gahm age, talk with your health care provider about genetic screening.  Always use sunscreen. Apply sunscreen liberally and repeatedly throughout the day.  Whenever you are outside, protect yourself by wearing long sleeves, pants, a wide-brimmed hat, and sunglasses.  What should I know about osteoporosis? Osteoporosis is a condition in which bone destruction happens more quickly than new bone creation. After menopause, you may be at an increased risk for osteoporosis. To help prevent osteoporosis or the bone fractures that can happen because of osteoporosis, the following is recommended:  If you are 46-71 years old, get at least 1,000 mg of calcium and at least 600 mg of vitamin D per day.  If you are older than age 55 but younger than age 65, get at least 1,200 mg of calcium and at least 600 mg of vitamin D per day.  If you are older than age 54, get at least 1,200 mg of calcium and at least 800 mg of vitamin D per day.  Smoking and excessive alcohol intake increase the risk of osteoporosis. Eat foods that are rich in calcium and vitamin D, and do weight-bearing exercises several times each week as directed by your health care provider. What should I know about how menopause affects my mental health? Depression may occur at any age, but it is more common as you become older. Common symptoms of depression  include:  Low or sad mood.  Changes in sleep patterns.  Changes in appetite or eating patterns.  Feeling an overall lack of motivation or enjoyment of activities that you previously enjoyed.  Frequent crying spells.  Talk with your health care provider if you think that you are experiencing depression. What should I know about immunizations? It is important that you get and maintain your immunizations. These include:  Tetanus, diphtheria, and pertussis (Tdap) booster vaccine.  Influenza every year before the flu season begins.  Pneumonia vaccine.  Shingles vaccine.  Your health care provider may also recommend other immunizations. This information is not intended to replace advice given to you by your health care provider. Make sure you discuss any questions you have with your health care provider. Document Released: 07/31/2005  Document Revised: 12/27/2015 Document Reviewed: 03/12/2015 Elsevier Interactive Patient Education  2018 [employer]  

## 2018-02-01 NOTE — Progress Notes (Signed)
[name] [birth year] [id number]    History:    Presents for breast and pelvic exam.  1988 TAH with BSO for fibroids.  Normal Pap and mammogram history.  Normal mammogram 2019 in [place].  2015 melanoma on arm has every 26-month checkups.  Negative colonoscopy.  2018 T score -1.8 at left hip.  Primary care and cardiologist manage hypertension, hypercholesteremia.  Surgery last year , not A. fib.  Exercises 7 days weekly.  Not sexually active.  Vaccines current.  Past medical history, past surgical history, family history and social history were all reviewed and documented in the EPIC chart.  Husband 95 having some health problems.  Helped care for her mother prior to her death last year.  30 year old son lives local.  Hoping to sell her home here to move full-time to [place].  ROS:  A ROS was performed and pertinent positives and negatives are included.  Exam:  Vitals:   02/01/18 1211  BP: 118/78  Weight: 146 lb (66.2 kg)  Height: 5\' 6"  (1.676 m)   Body mass index is 23.57 kg/m.   General appearance:  Normal Thyroid:  Symmetrical, normal in size, without palpable masses or nodularity. Respiratory  Auscultation:  Clear without wheezing or rhonchi Cardiovascular  Auscultation:  Regular rate, without rubs, murmurs or gallops  Edema/varicosities:  Not grossly evident Abdominal  Soft,nontender, without masses, guarding or rebound.  Liver/spleen:  No organomegaly noted  Hernia:  None appreciated  Skin  Inspection:  Grossly normal   Breasts: Examined lying and sitting.     Right: Without masses, retractions, discharge or axillary adenopathy.     Left: Without masses, retractions, discharge or axillary adenopathy. Gentitourinary   Inguinal/mons:  Normal without inguinal adenopathy  External genitalia:  Normal  BUS/Urethra/Skene's glands:  Normal  Vagina:  Normal  Cervix: And uterus absent adnexa/parametria:     Rt: Without masses or tenderness.   Lt: Without masses or  tenderness.  Anus and perineum: Normal  Digital rectal exam: Normal sphincter tone without palpated masses or tenderness  Assessment/Plan:  72 y.o. MWF G3, P3 for breast and pelvic exam with no complaints.  1980 TAH with BSO for fibroids on no HRT Osteopenia without elevated FRAX Primary care manages hypercholesteremia, hypertension meds and labs. Melanoma-scheduled follow-up  Plan: Vitamin D, SBE's, continue annual screening mammogram, calcium rich foods, home safety, fall prevention and importance of continuing regular weightbearing/cardio type exercise.      Morgan, 1:08 PM 02/01/2018

## 2018-02-02 LAB — VITAMIN D 25 HYDROXY (VIT D DEFICIENCY, FRACTURES): Vit D, 25-Hydroxy: 32 ng/mL (ref 30–100)

## 2018-02-07 DIAGNOSIS — H0100A Unspecified blepharitis right eye, upper and lower eyelids: Secondary | ICD-10-CM | POA: Diagnosis not present

## 2018-02-07 DIAGNOSIS — H2513 Age-related nuclear cataract, bilateral: Secondary | ICD-10-CM | POA: Diagnosis not present

## 2018-02-07 DIAGNOSIS — H52223 Regular astigmatism, bilateral: Secondary | ICD-10-CM | POA: Diagnosis not present

## 2018-02-07 DIAGNOSIS — H0100B Unspecified blepharitis left eye, upper and lower eyelids: Secondary | ICD-10-CM | POA: Diagnosis not present

## 2018-02-07 DIAGNOSIS — H5203 Hypermetropia, bilateral: Secondary | ICD-10-CM | POA: Diagnosis not present

## 2018-02-07 DIAGNOSIS — H524 Presbyopia: Secondary | ICD-10-CM | POA: Diagnosis not present

## 2018-02-23 DIAGNOSIS — H1045 Other chronic allergic conjunctivitis: Secondary | ICD-10-CM | POA: Diagnosis not present

## 2018-02-28 ENCOUNTER — Telehealth: Payer: Self-pay | Admitting: *Deleted

## 2018-02-28 ENCOUNTER — Ambulatory Visit (INDEPENDENT_AMBULATORY_CARE_PROVIDER_SITE_OTHER): Payer: Medicare Other | Admitting: *Deleted

## 2018-02-28 DIAGNOSIS — I4891 Unspecified atrial fibrillation: Secondary | ICD-10-CM | POA: Diagnosis not present

## 2018-02-28 MED ORDER — FLUCONAZOLE 150 MG PO TABS: 150.0000 mg | ORAL_TABLET | Freq: Once | ORAL | 0 refills | Status: AC

## 2018-02-28 NOTE — Telephone Encounter (Signed)
Okay for Diflucan 150, office visit if no relief.

## 2018-02-28 NOTE — Telephone Encounter (Signed)
Patient called was on antibiotic for eye, now has vaginal itching, asked if diflucan tablet can be sent to pharmacy. Please advise

## 2018-02-28 NOTE — Progress Notes (Signed)
Carelink Summary Report / Loop Recorder 

## 2018-02-28 NOTE — Telephone Encounter (Signed)
Patient informed, Rx sent.  

## 2018-03-08 ENCOUNTER — Other Ambulatory Visit: Payer: Self-pay | Admitting: Family Medicine

## 2018-03-08 LAB — CUP PACEART REMOTE DEVICE CHECK
MDC IDC PG IMPLANT DT: 20180625
MDC IDC SESS DTM: 20190805160641

## 2018-03-17 ENCOUNTER — Other Ambulatory Visit: Payer: Self-pay | Admitting: Family Medicine

## 2018-03-18 LAB — CUP PACEART REMOTE DEVICE CHECK
Date Time Interrogation Session: 20190907164017
MDC IDC PG IMPLANT DT: 20180625

## 2018-03-18 NOTE — Telephone Encounter (Signed)
Last OV 12/07/17, No future OV  Last filled 12/20/17, #30 with 2 refills

## 2018-03-20 NOTE — Telephone Encounter (Signed)
Refill sent electronically

## 2018-03-21 DIAGNOSIS — R252 Cramp and spasm: Secondary | ICD-10-CM | POA: Diagnosis not present

## 2018-03-21 DIAGNOSIS — F5101 Primary insomnia: Secondary | ICD-10-CM | POA: Diagnosis not present

## 2018-03-21 DIAGNOSIS — R140 Abdominal distension (gaseous): Secondary | ICD-10-CM | POA: Diagnosis not present

## 2018-03-21 DIAGNOSIS — R609 Edema, unspecified: Secondary | ICD-10-CM | POA: Diagnosis not present

## 2018-03-21 DIAGNOSIS — Z6822 Body mass index (BMI) 22.0-22.9, adult: Secondary | ICD-10-CM | POA: Diagnosis not present

## 2018-03-22 DIAGNOSIS — L538 Other specified erythematous conditions: Secondary | ICD-10-CM | POA: Diagnosis not present

## 2018-03-22 DIAGNOSIS — L570 Actinic keratosis: Secondary | ICD-10-CM | POA: Diagnosis not present

## 2018-03-22 DIAGNOSIS — L821 Other seborrheic keratosis: Secondary | ICD-10-CM | POA: Diagnosis not present

## 2018-03-22 DIAGNOSIS — Z85820 Personal history of malignant melanoma of skin: Secondary | ICD-10-CM | POA: Diagnosis not present

## 2018-03-22 DIAGNOSIS — L820 Inflamed seborrheic keratosis: Secondary | ICD-10-CM | POA: Diagnosis not present

## 2018-03-22 DIAGNOSIS — Z08 Encounter for follow-up examination after completed treatment for malignant neoplasm: Secondary | ICD-10-CM | POA: Diagnosis not present

## 2018-03-22 DIAGNOSIS — D225 Melanocytic nevi of trunk: Secondary | ICD-10-CM | POA: Diagnosis not present

## 2018-03-22 DIAGNOSIS — L814 Other melanin hyperpigmentation: Secondary | ICD-10-CM | POA: Diagnosis not present

## 2018-03-27 DIAGNOSIS — Z91041 Radiographic dye allergy status: Secondary | ICD-10-CM | POA: Diagnosis not present

## 2018-03-27 DIAGNOSIS — Z79899 Other long term (current) drug therapy: Secondary | ICD-10-CM | POA: Diagnosis not present

## 2018-03-27 DIAGNOSIS — Z87891 Personal history of nicotine dependence: Secondary | ICD-10-CM | POA: Diagnosis not present

## 2018-03-27 DIAGNOSIS — R0602 Shortness of breath: Secondary | ICD-10-CM | POA: Diagnosis not present

## 2018-03-27 DIAGNOSIS — G4700 Insomnia, unspecified: Secondary | ICD-10-CM | POA: Diagnosis not present

## 2018-03-27 DIAGNOSIS — Z85828 Personal history of other malignant neoplasm of skin: Secondary | ICD-10-CM | POA: Diagnosis not present

## 2018-03-27 DIAGNOSIS — E890 Postprocedural hypothyroidism: Secondary | ICD-10-CM | POA: Diagnosis not present

## 2018-03-27 DIAGNOSIS — R030 Elevated blood-pressure reading, without diagnosis of hypertension: Secondary | ICD-10-CM | POA: Diagnosis not present

## 2018-03-27 DIAGNOSIS — R220 Localized swelling, mass and lump, head: Secondary | ICD-10-CM | POA: Diagnosis not present

## 2018-03-27 DIAGNOSIS — T63481A Toxic effect of venom of other arthropod, accidental (unintentional), initial encounter: Secondary | ICD-10-CM | POA: Diagnosis not present

## 2018-03-27 DIAGNOSIS — Z888 Allergy status to other drugs, medicaments and biological substances status: Secondary | ICD-10-CM | POA: Diagnosis not present

## 2018-03-27 DIAGNOSIS — R21 Rash and other nonspecific skin eruption: Secondary | ICD-10-CM | POA: Diagnosis not present

## 2018-03-31 ENCOUNTER — Ambulatory Visit (INDEPENDENT_AMBULATORY_CARE_PROVIDER_SITE_OTHER): Payer: Medicare Other | Admitting: *Deleted

## 2018-03-31 DIAGNOSIS — I4891 Unspecified atrial fibrillation: Secondary | ICD-10-CM

## 2018-03-31 NOTE — Progress Notes (Signed)
Carelink Summary Report / Loop Recorder 

## 2018-04-04 DIAGNOSIS — Z85820 Personal history of malignant melanoma of skin: Secondary | ICD-10-CM | POA: Diagnosis not present

## 2018-04-04 DIAGNOSIS — L308 Other specified dermatitis: Secondary | ICD-10-CM | POA: Diagnosis not present

## 2018-04-04 DIAGNOSIS — L720 Epidermal cyst: Secondary | ICD-10-CM | POA: Diagnosis not present

## 2018-04-04 DIAGNOSIS — Z08 Encounter for follow-up examination after completed treatment for malignant neoplasm: Secondary | ICD-10-CM | POA: Diagnosis not present

## 2018-04-08 DIAGNOSIS — M2391 Unspecified internal derangement of right knee: Secondary | ICD-10-CM | POA: Diagnosis not present

## 2018-04-08 DIAGNOSIS — M25561 Pain in right knee: Secondary | ICD-10-CM | POA: Diagnosis not present

## 2018-04-08 DIAGNOSIS — Z789 Other specified health status: Secondary | ICD-10-CM | POA: Diagnosis not present

## 2018-04-16 LAB — CUP PACEART REMOTE DEVICE CHECK
Implantable Pulse Generator Implant Date: 20180625
MDC IDC SESS DTM: 20191010163639

## 2018-04-22 DIAGNOSIS — M2391 Unspecified internal derangement of right knee: Secondary | ICD-10-CM | POA: Diagnosis not present

## 2018-04-22 DIAGNOSIS — Z789 Other specified health status: Secondary | ICD-10-CM | POA: Diagnosis not present

## 2018-04-22 DIAGNOSIS — M25561 Pain in right knee: Secondary | ICD-10-CM | POA: Diagnosis not present

## 2018-04-23 DIAGNOSIS — Z23 Encounter for immunization: Secondary | ICD-10-CM | POA: Diagnosis not present

## 2018-05-03 ENCOUNTER — Ambulatory Visit (INDEPENDENT_AMBULATORY_CARE_PROVIDER_SITE_OTHER): Payer: Medicare Other | Admitting: *Deleted

## 2018-05-03 DIAGNOSIS — I4891 Unspecified atrial fibrillation: Secondary | ICD-10-CM | POA: Diagnosis not present

## 2018-05-04 NOTE — Progress Notes (Signed)
Carelink Summary Report / Loop Recorder 

## 2018-05-09 DIAGNOSIS — J3089 Other allergic rhinitis: Secondary | ICD-10-CM | POA: Diagnosis not present

## 2018-05-09 DIAGNOSIS — Z6821 Body mass index (BMI) 21.0-21.9, adult: Secondary | ICD-10-CM | POA: Diagnosis not present

## 2018-05-26 DIAGNOSIS — S83241A Other tear of medial meniscus, current injury, right knee, initial encounter: Secondary | ICD-10-CM | POA: Diagnosis not present

## 2018-05-26 DIAGNOSIS — S83271A Complex tear of lateral meniscus, current injury, right knee, initial encounter: Secondary | ICD-10-CM | POA: Diagnosis not present

## 2018-05-26 DIAGNOSIS — M25461 Effusion, right knee: Secondary | ICD-10-CM | POA: Diagnosis not present

## 2018-05-26 DIAGNOSIS — M7121 Synovial cyst of popliteal space [Baker], right knee: Secondary | ICD-10-CM | POA: Diagnosis not present

## 2018-06-03 DIAGNOSIS — Z789 Other specified health status: Secondary | ICD-10-CM | POA: Diagnosis not present

## 2018-06-03 DIAGNOSIS — S83241S Other tear of medial meniscus, current injury, right knee, sequela: Secondary | ICD-10-CM | POA: Diagnosis not present

## 2018-06-03 DIAGNOSIS — M25561 Pain in right knee: Secondary | ICD-10-CM | POA: Diagnosis not present

## 2018-06-06 ENCOUNTER — Ambulatory Visit (INDEPENDENT_AMBULATORY_CARE_PROVIDER_SITE_OTHER): Payer: Medicare Other

## 2018-06-06 DIAGNOSIS — I4891 Unspecified atrial fibrillation: Secondary | ICD-10-CM | POA: Diagnosis not present

## 2018-06-07 NOTE — Progress Notes (Signed)
Carelink Summary Report / Loop Recorder 

## 2018-06-20 DIAGNOSIS — Z803 Family history of malignant neoplasm of breast: Secondary | ICD-10-CM | POA: Diagnosis not present

## 2018-06-20 DIAGNOSIS — Z6821 Body mass index (BMI) 21.0-21.9, adult: Secondary | ICD-10-CM | POA: Diagnosis not present

## 2018-06-20 DIAGNOSIS — E785 Hyperlipidemia, unspecified: Secondary | ICD-10-CM | POA: Diagnosis not present

## 2018-06-20 DIAGNOSIS — F5104 Psychophysiologic insomnia: Secondary | ICD-10-CM | POA: Diagnosis not present

## 2018-06-20 DIAGNOSIS — R911 Solitary pulmonary nodule: Secondary | ICD-10-CM | POA: Diagnosis not present

## 2018-06-20 DIAGNOSIS — I480 Paroxysmal atrial fibrillation: Secondary | ICD-10-CM | POA: Diagnosis not present

## 2018-06-20 DIAGNOSIS — Z9889 Other specified postprocedural states: Secondary | ICD-10-CM | POA: Diagnosis not present

## 2018-06-20 DIAGNOSIS — D709 Neutropenia, unspecified: Secondary | ICD-10-CM | POA: Diagnosis not present

## 2018-06-20 DIAGNOSIS — Z1231 Encounter for screening mammogram for malignant neoplasm of breast: Secondary | ICD-10-CM | POA: Diagnosis not present

## 2018-06-20 DIAGNOSIS — Z1239 Encounter for other screening for malignant neoplasm of breast: Secondary | ICD-10-CM | POA: Diagnosis not present

## 2018-06-23 LAB — CUP PACEART REMOTE DEVICE CHECK
Date Time Interrogation Session: 20191112173945
Implantable Pulse Generator Implant Date: 20180625

## 2018-06-28 DIAGNOSIS — M23221 Derangement of posterior horn of medial meniscus due to old tear or injury, right knee: Secondary | ICD-10-CM | POA: Diagnosis not present

## 2018-06-28 DIAGNOSIS — S83231A Complex tear of medial meniscus, current injury, right knee, initial encounter: Secondary | ICD-10-CM | POA: Diagnosis not present

## 2018-06-28 DIAGNOSIS — Z87891 Personal history of nicotine dependence: Secondary | ICD-10-CM | POA: Diagnosis not present

## 2018-06-28 DIAGNOSIS — S83281A Other tear of lateral meniscus, current injury, right knee, initial encounter: Secondary | ICD-10-CM | POA: Diagnosis not present

## 2018-06-28 DIAGNOSIS — M948X6 Other specified disorders of cartilage, lower leg: Secondary | ICD-10-CM | POA: Diagnosis not present

## 2018-06-28 DIAGNOSIS — M94261 Chondromalacia, right knee: Secondary | ICD-10-CM | POA: Diagnosis not present

## 2018-06-28 DIAGNOSIS — M659 Synovitis and tenosynovitis, unspecified: Secondary | ICD-10-CM | POA: Diagnosis not present

## 2018-06-28 DIAGNOSIS — M2391 Unspecified internal derangement of right knee: Secondary | ICD-10-CM | POA: Diagnosis not present

## 2018-07-08 ENCOUNTER — Ambulatory Visit (INDEPENDENT_AMBULATORY_CARE_PROVIDER_SITE_OTHER): Payer: Medicare Other

## 2018-07-08 DIAGNOSIS — I4891 Unspecified atrial fibrillation: Secondary | ICD-10-CM

## 2018-07-08 NOTE — Progress Notes (Signed)
Carelink Summary Report / Loop Recorder 

## 2018-07-09 LAB — CUP PACEART REMOTE DEVICE CHECK
Implantable Pulse Generator Implant Date: 20180625
MDC IDC SESS DTM: 20200117191121

## 2018-07-10 LAB — CUP PACEART REMOTE DEVICE CHECK
Date Time Interrogation Session: 20191215174023
MDC IDC PG IMPLANT DT: 20180625

## 2018-07-11 DIAGNOSIS — Z4789 Encounter for other orthopedic aftercare: Secondary | ICD-10-CM | POA: Diagnosis not present

## 2018-07-11 DIAGNOSIS — Z789 Other specified health status: Secondary | ICD-10-CM | POA: Diagnosis not present

## 2018-08-05 DIAGNOSIS — E785 Hyperlipidemia, unspecified: Secondary | ICD-10-CM | POA: Diagnosis not present

## 2018-08-05 DIAGNOSIS — D709 Neutropenia, unspecified: Secondary | ICD-10-CM | POA: Diagnosis not present

## 2018-08-05 DIAGNOSIS — Z789 Other specified health status: Secondary | ICD-10-CM | POA: Diagnosis not present

## 2018-08-05 DIAGNOSIS — D7589 Other specified diseases of blood and blood-forming organs: Secondary | ICD-10-CM | POA: Diagnosis not present

## 2018-08-05 DIAGNOSIS — I872 Venous insufficiency (chronic) (peripheral): Secondary | ICD-10-CM | POA: Diagnosis not present

## 2018-08-05 DIAGNOSIS — I83891 Varicose veins of right lower extremities with other complications: Secondary | ICD-10-CM | POA: Diagnosis not present

## 2018-08-05 DIAGNOSIS — R911 Solitary pulmonary nodule: Secondary | ICD-10-CM | POA: Diagnosis not present

## 2018-08-05 DIAGNOSIS — I83811 Varicose veins of right lower extremities with pain: Secondary | ICD-10-CM | POA: Diagnosis not present

## 2018-08-05 DIAGNOSIS — Z1231 Encounter for screening mammogram for malignant neoplasm of breast: Secondary | ICD-10-CM | POA: Diagnosis not present

## 2018-08-05 DIAGNOSIS — F5104 Psychophysiologic insomnia: Secondary | ICD-10-CM | POA: Diagnosis not present

## 2018-08-05 DIAGNOSIS — L819 Disorder of pigmentation, unspecified: Secondary | ICD-10-CM | POA: Diagnosis not present

## 2018-08-05 DIAGNOSIS — I8001 Phlebitis and thrombophlebitis of superficial vessels of right lower extremity: Secondary | ICD-10-CM | POA: Diagnosis not present

## 2018-08-05 DIAGNOSIS — I824Z1 Acute embolism and thrombosis of unspecified deep veins of right distal lower extremity: Secondary | ICD-10-CM | POA: Diagnosis not present

## 2018-08-05 DIAGNOSIS — Z803 Family history of malignant neoplasm of breast: Secondary | ICD-10-CM | POA: Diagnosis not present

## 2018-08-10 ENCOUNTER — Ambulatory Visit (INDEPENDENT_AMBULATORY_CARE_PROVIDER_SITE_OTHER): Payer: Medicare Other

## 2018-08-10 DIAGNOSIS — I4891 Unspecified atrial fibrillation: Secondary | ICD-10-CM | POA: Diagnosis not present

## 2018-08-11 LAB — CUP PACEART REMOTE DEVICE CHECK
Date Time Interrogation Session: 20200219194026
MDC IDC PG IMPLANT DT: 20180625

## 2018-08-12 DIAGNOSIS — Z6821 Body mass index (BMI) 21.0-21.9, adult: Secondary | ICD-10-CM | POA: Diagnosis not present

## 2018-08-12 DIAGNOSIS — I824Z1 Acute embolism and thrombosis of unspecified deep veins of right distal lower extremity: Secondary | ICD-10-CM | POA: Diagnosis not present

## 2018-08-18 NOTE — Progress Notes (Signed)
Carelink Summary Report / Loop Recorder 

## 2018-08-22 DIAGNOSIS — Z4789 Encounter for other orthopedic aftercare: Secondary | ICD-10-CM | POA: Diagnosis not present

## 2018-08-22 DIAGNOSIS — Z789 Other specified health status: Secondary | ICD-10-CM | POA: Diagnosis not present

## 2018-08-23 DIAGNOSIS — Z7951 Long term (current) use of inhaled steroids: Secondary | ICD-10-CM | POA: Diagnosis not present

## 2018-08-23 DIAGNOSIS — E079 Disorder of thyroid, unspecified: Secondary | ICD-10-CM | POA: Diagnosis not present

## 2018-08-23 DIAGNOSIS — M8580 Other specified disorders of bone density and structure, unspecified site: Secondary | ICD-10-CM | POA: Diagnosis not present

## 2018-08-23 DIAGNOSIS — I340 Nonrheumatic mitral (valve) insufficiency: Secondary | ICD-10-CM | POA: Diagnosis not present

## 2018-08-23 DIAGNOSIS — Z7901 Long term (current) use of anticoagulants: Secondary | ICD-10-CM | POA: Diagnosis not present

## 2018-08-23 DIAGNOSIS — G4700 Insomnia, unspecified: Secondary | ICD-10-CM | POA: Diagnosis not present

## 2018-08-23 DIAGNOSIS — Z952 Presence of prosthetic heart valve: Secondary | ICD-10-CM | POA: Diagnosis not present

## 2018-08-23 DIAGNOSIS — Z888 Allergy status to other drugs, medicaments and biological substances status: Secondary | ICD-10-CM | POA: Diagnosis not present

## 2018-08-23 DIAGNOSIS — Z91040 Latex allergy status: Secondary | ICD-10-CM | POA: Diagnosis not present

## 2018-08-23 DIAGNOSIS — Z87891 Personal history of nicotine dependence: Secondary | ICD-10-CM | POA: Diagnosis not present

## 2018-08-23 DIAGNOSIS — Z86718 Personal history of other venous thrombosis and embolism: Secondary | ICD-10-CM | POA: Diagnosis not present

## 2018-08-23 DIAGNOSIS — E785 Hyperlipidemia, unspecified: Secondary | ICD-10-CM | POA: Diagnosis not present

## 2018-08-23 DIAGNOSIS — Z85828 Personal history of other malignant neoplasm of skin: Secondary | ICD-10-CM | POA: Diagnosis not present

## 2018-08-23 DIAGNOSIS — I4891 Unspecified atrial fibrillation: Secondary | ICD-10-CM | POA: Diagnosis not present

## 2018-08-30 DIAGNOSIS — K909 Intestinal malabsorption, unspecified: Secondary | ICD-10-CM | POA: Diagnosis not present

## 2018-08-30 DIAGNOSIS — R5383 Other fatigue: Secondary | ICD-10-CM | POA: Diagnosis not present

## 2018-08-30 DIAGNOSIS — Z6821 Body mass index (BMI) 21.0-21.9, adult: Secondary | ICD-10-CM | POA: Diagnosis not present

## 2018-08-30 DIAGNOSIS — I82451 Acute embolism and thrombosis of right peroneal vein: Secondary | ICD-10-CM | POA: Diagnosis not present

## 2018-08-30 DIAGNOSIS — E538 Deficiency of other specified B group vitamins: Secondary | ICD-10-CM | POA: Diagnosis not present

## 2018-08-30 DIAGNOSIS — Z9889 Other specified postprocedural states: Secondary | ICD-10-CM | POA: Diagnosis not present

## 2018-08-30 DIAGNOSIS — D72819 Decreased white blood cell count, unspecified: Secondary | ICD-10-CM | POA: Diagnosis not present

## 2018-08-30 DIAGNOSIS — D7589 Other specified diseases of blood and blood-forming organs: Secondary | ICD-10-CM | POA: Diagnosis not present

## 2018-08-30 DIAGNOSIS — E7800 Pure hypercholesterolemia, unspecified: Secondary | ICD-10-CM | POA: Diagnosis not present

## 2018-09-06 DIAGNOSIS — I83811 Varicose veins of right lower extremities with pain: Secondary | ICD-10-CM | POA: Diagnosis not present

## 2018-09-06 DIAGNOSIS — I82491 Acute embolism and thrombosis of other specified deep vein of right lower extremity: Secondary | ICD-10-CM | POA: Diagnosis not present

## 2018-09-06 DIAGNOSIS — M7989 Other specified soft tissue disorders: Secondary | ICD-10-CM | POA: Diagnosis not present

## 2018-09-06 DIAGNOSIS — I83813 Varicose veins of bilateral lower extremities with pain: Secondary | ICD-10-CM | POA: Diagnosis not present

## 2018-09-06 DIAGNOSIS — Z7901 Long term (current) use of anticoagulants: Secondary | ICD-10-CM | POA: Diagnosis not present

## 2018-09-06 DIAGNOSIS — I872 Venous insufficiency (chronic) (peripheral): Secondary | ICD-10-CM | POA: Diagnosis not present

## 2018-09-06 DIAGNOSIS — R238 Other skin changes: Secondary | ICD-10-CM | POA: Diagnosis not present

## 2018-09-12 ENCOUNTER — Ambulatory Visit (INDEPENDENT_AMBULATORY_CARE_PROVIDER_SITE_OTHER): Payer: Medicare Other | Admitting: *Deleted

## 2018-09-12 ENCOUNTER — Other Ambulatory Visit: Payer: Self-pay

## 2018-09-12 DIAGNOSIS — I4891 Unspecified atrial fibrillation: Secondary | ICD-10-CM | POA: Diagnosis not present

## 2018-09-13 DIAGNOSIS — L820 Inflamed seborrheic keratosis: Secondary | ICD-10-CM | POA: Diagnosis not present

## 2018-09-13 LAB — CUP PACEART REMOTE DEVICE CHECK
MDC IDC PG IMPLANT DT: 20180625
MDC IDC SESS DTM: 20200323214036

## 2018-09-20 NOTE — Progress Notes (Signed)
Carelink Summary Report / Loop Recorder 

## 2018-10-17 ENCOUNTER — Other Ambulatory Visit: Payer: Self-pay

## 2018-10-17 ENCOUNTER — Ambulatory Visit (INDEPENDENT_AMBULATORY_CARE_PROVIDER_SITE_OTHER): Payer: Medicare Other | Admitting: *Deleted

## 2018-10-17 DIAGNOSIS — I4891 Unspecified atrial fibrillation: Secondary | ICD-10-CM

## 2018-10-17 LAB — CUP PACEART REMOTE DEVICE CHECK
Date Time Interrogation Session: 20200425224000
Implantable Pulse Generator Implant Date: 20180625

## 2018-10-25 NOTE — Progress Notes (Signed)
Carelink Summary Report / Loop Recorder 

## 2018-11-17 ENCOUNTER — Ambulatory Visit (INDEPENDENT_AMBULATORY_CARE_PROVIDER_SITE_OTHER): Payer: Medicare Other | Admitting: *Deleted

## 2018-11-17 DIAGNOSIS — I480 Paroxysmal atrial fibrillation: Secondary | ICD-10-CM

## 2018-11-18 LAB — CUP PACEART REMOTE DEVICE CHECK
Date Time Interrogation Session: 20200528233937
Implantable Pulse Generator Implant Date: 20180625

## 2018-11-24 NOTE — Progress Notes (Signed)
Carelink Summary Report / Loop Recorder 

## 2018-12-12 ENCOUNTER — Ambulatory Visit: Payer: Medicare Other

## 2018-12-20 ENCOUNTER — Ambulatory Visit (INDEPENDENT_AMBULATORY_CARE_PROVIDER_SITE_OTHER): Payer: Medicare Other | Admitting: *Deleted

## 2018-12-20 DIAGNOSIS — I480 Paroxysmal atrial fibrillation: Secondary | ICD-10-CM

## 2018-12-21 LAB — CUP PACEART REMOTE DEVICE CHECK
Date Time Interrogation Session: 20200701001124
Implantable Pulse Generator Implant Date: 20180625

## 2018-12-31 NOTE — Progress Notes (Signed)
Carelink Summary Report / Loop Recorder 

## 2019-01-23 ENCOUNTER — Ambulatory Visit (INDEPENDENT_AMBULATORY_CARE_PROVIDER_SITE_OTHER): Payer: Medicare Other | Admitting: *Deleted

## 2019-01-23 DIAGNOSIS — I4891 Unspecified atrial fibrillation: Secondary | ICD-10-CM

## 2019-01-23 LAB — CUP PACEART REMOTE DEVICE CHECK
Date Time Interrogation Session: 20200803000749
Implantable Pulse Generator Implant Date: 20180625

## 2019-02-01 NOTE — Progress Notes (Signed)
Carelink Summary Report / Loop Recorder 

## 2019-02-24 ENCOUNTER — Ambulatory Visit (INDEPENDENT_AMBULATORY_CARE_PROVIDER_SITE_OTHER): Payer: Medicare Other | Admitting: *Deleted

## 2019-02-24 DIAGNOSIS — I4891 Unspecified atrial fibrillation: Secondary | ICD-10-CM | POA: Diagnosis not present

## 2019-02-25 LAB — CUP PACEART REMOTE DEVICE CHECK
Date Time Interrogation Session: 20200905004119
Implantable Pulse Generator Implant Date: 20180625

## 2019-03-08 NOTE — Progress Notes (Signed)
Carelink Summary Report / Loop Recorder 

## 2019-03-24 ENCOUNTER — Telehealth: Payer: Self-pay | Admitting: Internal Medicine

## 2019-03-24 NOTE — Telephone Encounter (Signed)
Received release request in [facility]. LMOVM for patient requesting call back. Will confirm that she wishes to transfer remote monitoring to "[facility]", phone number [phone].

## 2019-03-24 NOTE — Telephone Encounter (Signed)
New Message:    Patient have moved, please release from [facility].

## 2019-03-27 NOTE — Telephone Encounter (Signed)
I tried to call pt to see if she wanted to be released to [facility] but did not receive an answer. I did not get the voicemail either.

## 2019-03-27 NOTE — Telephone Encounter (Signed)
I spoke with the pt and she states she has moved. She states she do want to be released in [place] and I did. I also marked her Inactive in [facility].

## 2019-03-29 ENCOUNTER — Encounter: Payer: Medicare Other | Admitting: *Deleted

## 2019-03-29 ENCOUNTER — Encounter: Payer: Self-pay | Admitting: Gynecology

## 2019-08-21 ENCOUNTER — Encounter: Payer: Self-pay | Admitting: Cardiology
